# Patient Record
Sex: Female | Born: 1980 | Race: Black or African American | Hispanic: No | Marital: Single | Smoking: Current every day smoker
Health system: Southern US, Community
[De-identification: ages and names within clinical notes are randomized; demographics above are authoritative.]

## PROBLEM LIST (undated history)

## (undated) DIAGNOSIS — F32A Depression, unspecified: Secondary | ICD-10-CM

## (undated) DIAGNOSIS — D649 Anemia, unspecified: Secondary | ICD-10-CM

## (undated) DIAGNOSIS — B20 Human immunodeficiency virus [HIV] disease: Secondary | ICD-10-CM

## (undated) DIAGNOSIS — F329 Major depressive disorder, single episode, unspecified: Secondary | ICD-10-CM

## (undated) DIAGNOSIS — E119 Type 2 diabetes mellitus without complications: Secondary | ICD-10-CM

## (undated) DIAGNOSIS — I10 Essential (primary) hypertension: Secondary | ICD-10-CM

## (undated) DIAGNOSIS — K219 Gastro-esophageal reflux disease without esophagitis: Secondary | ICD-10-CM

## (undated) DIAGNOSIS — E109 Type 1 diabetes mellitus without complications: Secondary | ICD-10-CM

## (undated) DIAGNOSIS — F419 Anxiety disorder, unspecified: Secondary | ICD-10-CM

## (undated) HISTORY — PX: FRACTURE SURGERY: SHX138

## (undated) HISTORY — DX: Type 2 diabetes mellitus without complications: E11.9

## (undated) HISTORY — DX: Major depressive disorder, single episode, unspecified: F32.9

## (undated) HISTORY — PX: TONSILLECTOMY: SUR1361

## (undated) HISTORY — PX: CHOLECYSTECTOMY: SHX55

## (undated) HISTORY — DX: Type 1 diabetes mellitus without complications: E10.9

## (undated) HISTORY — DX: Depression, unspecified: F32.A

---

## 2008-06-21 ENCOUNTER — Ambulatory Visit: Payer: Self-pay | Admitting: Gynecology

## 2008-06-22 ENCOUNTER — Inpatient Hospital Stay (HOSPITAL_COMMUNITY): Admission: AD | Admit: 2008-06-22 | Discharge: 2008-06-26 | Payer: Self-pay | Admitting: Obstetrics & Gynecology

## 2008-06-22 ENCOUNTER — Ambulatory Visit: Payer: Self-pay | Admitting: Family Medicine

## 2008-07-03 ENCOUNTER — Ambulatory Visit: Payer: Self-pay | Admitting: Gynecology

## 2008-07-05 ENCOUNTER — Ambulatory Visit: Payer: Self-pay | Admitting: Obstetrics & Gynecology

## 2008-07-12 ENCOUNTER — Ambulatory Visit: Payer: Self-pay | Admitting: Obstetrics & Gynecology

## 2008-07-16 ENCOUNTER — Ambulatory Visit: Payer: Self-pay | Admitting: Obstetrics & Gynecology

## 2008-07-19 ENCOUNTER — Ambulatory Visit (HOSPITAL_COMMUNITY): Admission: RE | Admit: 2008-07-19 | Discharge: 2008-07-19 | Payer: Self-pay | Admitting: Obstetrics & Gynecology

## 2008-07-19 ENCOUNTER — Ambulatory Visit: Payer: Self-pay | Admitting: Obstetrics & Gynecology

## 2008-07-26 ENCOUNTER — Ambulatory Visit: Payer: Self-pay | Admitting: Obstetrics & Gynecology

## 2008-07-27 ENCOUNTER — Ambulatory Visit (HOSPITAL_COMMUNITY): Admission: RE | Admit: 2008-07-27 | Discharge: 2008-07-27 | Payer: Self-pay | Admitting: Obstetrics & Gynecology

## 2008-07-30 ENCOUNTER — Ambulatory Visit: Payer: Self-pay | Admitting: Family Medicine

## 2008-07-30 ENCOUNTER — Encounter: Payer: Self-pay | Admitting: Obstetrics & Gynecology

## 2008-07-30 LAB — CONVERTED CEMR LAB
AST: 7 units/L (ref 0–37)
BUN: 7 mg/dL (ref 6–23)
CO2: 17 meq/L — ABNORMAL LOW (ref 19–32)
Chloride: 106 meq/L (ref 96–112)
Creatinine, Ser: 0.46 mg/dL (ref 0.40–1.20)
Glucose, Bld: 236 mg/dL — ABNORMAL HIGH (ref 70–99)
HCT: 32.7 % — ABNORMAL LOW (ref 36.0–46.0)
MCHC: 33.9 g/dL (ref 30.0–36.0)
MCV: 88.1 fL (ref 78.0–100.0)
RDW: 13.1 % (ref 11.5–15.5)
Total Bilirubin: 0.2 mg/dL — ABNORMAL LOW (ref 0.3–1.2)
WBC: 7.3 10*3/uL (ref 4.0–10.5)

## 2008-07-31 ENCOUNTER — Ambulatory Visit: Payer: Self-pay | Admitting: Obstetrics & Gynecology

## 2008-07-31 ENCOUNTER — Inpatient Hospital Stay (HOSPITAL_COMMUNITY): Admission: AD | Admit: 2008-07-31 | Discharge: 2008-07-31 | Payer: Self-pay | Admitting: Obstetrics & Gynecology

## 2008-08-02 ENCOUNTER — Ambulatory Visit (HOSPITAL_COMMUNITY): Admission: RE | Admit: 2008-08-02 | Discharge: 2008-08-02 | Payer: Self-pay | Admitting: Obstetrics & Gynecology

## 2008-08-02 ENCOUNTER — Ambulatory Visit: Payer: Self-pay | Admitting: Obstetrics & Gynecology

## 2008-08-06 ENCOUNTER — Ambulatory Visit: Payer: Self-pay | Admitting: Obstetrics & Gynecology

## 2008-08-06 ENCOUNTER — Inpatient Hospital Stay (HOSPITAL_COMMUNITY): Admission: AD | Admit: 2008-08-06 | Discharge: 2008-08-08 | Payer: Self-pay | Admitting: Family Medicine

## 2008-08-13 ENCOUNTER — Ambulatory Visit: Payer: Self-pay | Admitting: Obstetrics & Gynecology

## 2008-08-20 ENCOUNTER — Ambulatory Visit: Payer: Self-pay | Admitting: Family Medicine

## 2008-08-24 ENCOUNTER — Inpatient Hospital Stay (HOSPITAL_COMMUNITY): Admission: RE | Admit: 2008-08-24 | Discharge: 2008-08-28 | Payer: Self-pay | Admitting: Family Medicine

## 2008-08-24 ENCOUNTER — Ambulatory Visit: Payer: Self-pay | Admitting: Obstetrics & Gynecology

## 2008-08-24 ENCOUNTER — Encounter: Payer: Self-pay | Admitting: Obstetrics & Gynecology

## 2008-08-30 ENCOUNTER — Inpatient Hospital Stay (HOSPITAL_COMMUNITY): Admission: AD | Admit: 2008-08-30 | Discharge: 2008-08-30 | Payer: Self-pay | Admitting: Obstetrics & Gynecology

## 2008-09-03 ENCOUNTER — Ambulatory Visit: Payer: Self-pay | Admitting: Obstetrics & Gynecology

## 2009-10-19 DIAGNOSIS — Z21 Asymptomatic human immunodeficiency virus [HIV] infection status: Secondary | ICD-10-CM

## 2009-10-19 DIAGNOSIS — B20 Human immunodeficiency virus [HIV] disease: Secondary | ICD-10-CM

## 2009-10-19 HISTORY — DX: Human immunodeficiency virus (HIV) disease: B20

## 2009-10-19 HISTORY — PX: TUBAL LIGATION: SHX77

## 2009-10-19 HISTORY — DX: Asymptomatic human immunodeficiency virus (hiv) infection status: Z21

## 2010-11-09 ENCOUNTER — Encounter: Payer: Self-pay | Admitting: *Deleted

## 2010-11-09 ENCOUNTER — Encounter: Payer: Self-pay | Admitting: Obstetrics & Gynecology

## 2010-11-10 ENCOUNTER — Encounter: Payer: Self-pay | Admitting: *Deleted

## 2011-03-03 NOTE — Op Note (Signed)
NAMEAYANI, OSPINA         ACCOUNT NO.:  [id number]   MEDICAL RECORD NO.:  [id number]         PATIENT TYPE:  WINP   LOCATION:  WOC                           FACILITY:  [facility]   PHYSICIAN:  Allie Bossier, MD        DATE OF BIRTH:  1981-06-16   DATE OF PROCEDURE:  DATE OF DISCHARGE:                               OPERATIVE REPORT   PREOPERATIVE DIAGNOSES:  1. Morbid obesity.  2. Preeclampsia.  3. Insulin-dependent diabetes mellitus, poorly controlled.  4. 37 weeks' estimated gestational age.  5. Previous cesarean section x2.   POSTOPERATIVE DIAGNOSES:  1. Morbid obesity.  2. Preeclampsia.  3. Insulin-dependent diabetes mellitus, poorly controlled.  4. 37 weeks' estimated gestational age.  5. Previous cesarean section x2.  6. Extensive omental adhesions.   SURGEON:  Allie Bossier, MD   ASSISTANT:  Shelbie Proctor. Shawnie Pons, MD   COMPLICATIONS:  None.   ANESTHESIA:  Spinal, Raul Del, MD   ESTIMATED BLOOD LOSS:  1 L.   SPECIMENS:  Placenta and cord blood.   FINDINGS:  Living female infant, Apgars 9 and 9 at 1 and 5 minutes, weight  pending, he had a loose nuchal cord, and a true knot in the umbilical  cord.   DETAILED PROCEDURE AND FINDINGS:  The risks, benefits, and alternatives  of the surgery were explained, understood, and accepted.  Consents were  signed.  Dr. Shawnie Pons had an extensive conversation with Rainelle about  tying her tubes and the risks of surgery with each successive cesarean  section.  We also counseled her that we would be making a vertical  incision due to her pannus that comes down over her thighs.  Her spinal  anesthesia was followed without complication.  She was prepped and  draped in the usual sterile fashion and she had a gentle tilt to the  left.  After adequate anesthesia was assured, a vertical incision was  made up to the umbilicus.  Incision was carried down through the 8 cm of  subcutaneous tissue.  Bleeding encountered was cauterized with  Bovie.  The fascia was scored in the midline.  The fascia was elevated with  Kocher clamps and incised inferiorly and superiorly.  There was a large  amount of omental adhesions to the fascia was very careful when  dissecting to avoid injury to any surrounding structures.  Once we were  into the peritoneum, it was obvious that we did not have enough room to  deliver the baby through the incision that was made and therefore curved  the skin incision to the left of the umbilicus.  The subcutaneous tissue  at this site was taken down with the Bovie and better exposure was  obtained.  A bladder blade was placed and transverse incision was made  on the moderately well-developed lower uterine segment.  Amniotomy was  performed and clear fluid was noted.  The baby was delivered from vertex  presentation.  Mouth and nostrils were suctioned prior to deliver of the  shoulders.  The loose nuchal cord was easily reduced.  The cord was  clamped and cut and the baby  was transferred to the NICU personnel for  routine care.  The weight is pending.  Cord blood sample was obtained.  The placenta was extracted manually and the uterine tear was cleaned  with a dry lap sponge.  The uterus was left in situ.  Retractors were  placed and the uterine incision was closed with 2 layers of 0 chromic  running locking suture.  Excellent hemostasis was noted.  The adnexa  were palpably normal.  The uterine incision was again inspected and  noted to be hemostatic as was the rectus fascia.  The rectus fascia was  elevated with Kocher clamps and closed with a 0 PDS in a running  nonlocking fashion.  No defects were palpable.  Subcutaneous tissue was  irrigated, cleaned and dried.  The deep amount of subcutaneous tissue  was closed with a 2-0 plain catgut suture in a running nonlocking  fashion.  This was done to close the dead space to help prevent an  infection.  A subcuticular closure was done with 3-0 Vicryl suture.   Steri-Strips were placed across the incision.  Please note that at the  beginning of the case prior to the incision, 30 mL of 0.5% Marcaine was  injected in the subcutaneous tissue at the site of the expected  incision.   She was taken to the recovery room in stable condition.  She will be  transferred to the ICU for magnesium therapy due to her diagnosis of  preeclampsia.  This will be given for 24 hours.  She tolerated the  procedure well and a Foley catheter drained clear urine throughout.  The  instrument, sponge, and needle counts were correct.      Allie Bossier, MD  Electronically Signed     MCD/MEDQ  D:  08/24/2008  T:  08/25/2008  Job:  [id number]

## 2011-03-03 NOTE — Discharge Summary (Signed)
NAMENELI, FOFANA         ACCOUNT NO.:  [id number]   MEDICAL RECORD NO.:  [id number]           PATIENT TYPE:   LOCATION:                                 FACILITY:   PHYSICIAN:  Allie Bossier, MD        DATE OF BIRTH:  1981-04-07   DATE OF ADMISSION:  08/06/2008  DATE OF DISCHARGE:  08/08/2008                               DISCHARGE SUMMARY   REASON FOR HOSPITALIZATION:  [name] is a 30 year old  gravida 4, para 1-1-1-2 who is admitted at 66 for weeks' gestational age  due to elevated blood pressures noted in the [facility].  The  patient has been followed this pregnancy for type 2 diabetes, as well as  late presentation for care, and morbid obesity.  In Evaluation Clinic on  the day of admission, she was noted to have blood pressure 149/89, as  well as elevated fasting blood glucose, so she was admitted for further  evaluation.   HOSPITAL COURSE:  The patient was admitted mostly for glucose control as  well as testing for gestational hypertension.  As per previous  admission, the patient was placed on a hospital diet.  Her blood sugars  remained under good control with her usual amount of insulin.  Her PIH  workup revealed a CBC that showed hemoglobin and hematocrit of 10.7 and  31.7 respectively and platelets of 185.  Her CMP was unremarkable with  normal liver function test.  A 24-hour urine for protein is pending.  The 24-hour urine creatinine level was 1650, which is within the normal  range.  An ultrasound was performed, which showed a single 10 in the  cephalic presentation with a posterior placenta and normal AFI at 16 and  EFW of 35%, and a BPP of 8/8.  The estimated fetal weight is  appropriate, although there is a downward trend noted.  During her  admission, her blood pressures were persistently in the 130s over 80s  range.  Additionally, her fasting blood glucoses were in the 60-80  range.  At the time of discharge, the patient is without  headaches,  abdominal pain, or visual disturbances, she has no complaints  whatsoever.  Her physical exam at discharge is essentially unremarkable  except for trace pretibial edema, and normal reflexes and no clonus.   FINAL DIAGNOSES:  1. Type 2 diabetes mellitus.  2. Gestational hypertension.  3. Morbid obesity.  4. Intrauterine pregnancy at 34-6/7th weeks gestational age.  5. History of previous cesarean section.  6. Late presentation for prenatal care.   CONDITION AT DISCHARGE:  Good.   DISCHARGE INSTRUCTIONS:  The patient will continue her previous dose of  insulin, which includes 14 units NPH and 9 units regular every morning,  5 units regular at dinner time, and 10 units NPH at bedtime.  She also  continue her prenatal vitamins.  She has an appointment for the [facility] tomorrow, Thursday, October 24.  All the patient's questions  were answered and the patient voiced understanding of our instructions.      Odie Sera, DO  Electronically Signed  ______________________________  Allie Bossier, MD    MC/MEDQ  D:  08/08/2008  T:  08/09/2008  Job:  [id number]

## 2011-03-03 NOTE — Discharge Summary (Signed)
Sherry Hall, Sherry Hall         ACCOUNT NO.:  [id number]   MEDICAL RECORD NO.:  [id number]           PATIENT TYPE:   LOCATION:                                 FACILITY:   PHYSICIAN:  Allie Bossier, MD        DATE OF BIRTH:  July 20, 1981   DATE OF ADMISSION:  08/24/2008  DATE OF DISCHARGE:  08/28/2008                               DISCHARGE SUMMARY   DISCHARGE DIAGNOSES:  1. Status post repeat low transverse cesarean section.  2. Intrauterine pregnancy at 37 weeks' gestation.  3. Morbid obesity.  4. Preeclampsia.  5. Insulin-dependent diabetes mellitus.   PROCEDURE:  Repeat low transverse cesarean section.   CONSULTS:  Child psychotherapist for history of maternal substance abuse.  Positive UDS in infant.   HOSPITAL COURSE:  A 30 year old G4, P1-1-1-2, at approximately 72 weeks'  gestation presented for repeat cesarean section for preeclampsia and 2  prior cesarean sections.  The patient underwent cesarean section without  complications, delivered a 5.7 pound female infant with Apgars 9 at 1  minute and 10 at 5 minutes.  Loose nuchal cord x1 with true knot in  umbilical cord.  Placenta was extracted manually.  The patient tolerated  the procedure well.  Estimated blood loss was 1000 mL.  1. Status post cesarean section, the patient was transferred to the      AICU for 24 hours of postpartum magnesium for preeclampsia.  2. Status post magnesium, the patient was transferred to General      Mother and Baby Unit without complications.  3. Hypertension, the patient's blood pressure remained elevated      postpartum.  Elevated blood pressures were initially controlled      with labetalol 200 mg b.i.d., hydrochlorothiazide 25 mg daily was      added on postop day #3, enalapril 5 mg p.o. daily was added at      discharge for renal protection as the patient has a history of      diabetes mellitus.  It is of note due to recent use of marijuana,      the patient was interviewed by Child psychotherapist and  Department of      Social Services for positive marijuana in neonate urine.  The      patient was very distraught throughout process and blood pressures      remained elevated.  The patient was asymptomatic from elevated      blood pressures besides occasional headache.  Prior to discharge,      blood pressure was stable systolic in the 140s, diastolic 80s-90s.  4. Diabetes mellitus.  Blood sugars were controlled with NPH and      regular insulin.  Regimen was adjusted to achieve fasting blood      sugars of less than 100.  On postop day #3, the patient had 2      episodes of hypoglycemia which resulted in jitteriness and shaking      which quickly resolved with juice and crackers.  Insulin regimen      prior to discharge was NPH 6 units q.a.m., regular insulin 5 units  q.a.m., and regular insulin 5 units q.p.m.   ADMISSION LABORATORY DATA:  GBS negative, HIV negative, RPR negative,  hepatitis B surface antigen negative, rubella immune.   DISCHARGE LABORATORY DATA:  On August 26, 2008, hemoglobin 9.4,  hematocrit 27.4.   DISCHARGE MEDICATIONS:  1. Darvocet-N 100/650 mg one p.o. q.3 h. p.r.n. pain.  2. Motrin 600 mg one p.o. q.6 h. p.r.n. pain.  3. NPH 6 units q.a.m.  4. Regular insulin 5 units q.a.m.  5. Regular insulin 5 units q.p.m.  6. Hydrochlorothiazide 25 mg p.o. daily  7. Labetalol 200 mg p.o. b.i.d.  8. Enalapril 5 mg p.o. daily.  9. Iron sulfate 325 mg one p.o. b.i.d.  10.Colace 100 mg one p.o. b.i.d. p.r.n. constipation.  11.Status post Depo-Provera 150 mg IM shot x1.   DISCHARGE INSTRUCTIONS:  1. Diabetic, low-carb diet.  2. Pelvic rest x6 weeks, no heavy lifting.  3. Follow up in 1 week at [facility] on Monday, September 03, 2008, at      11 a.m. for incision check and blood pressure check.  4. Follow up in 4 weeks at [facility] for      postpartum check.  5. Schedule a followup appoint with [facility], primary      care  physician, for diabetes mellitus control.      Sherry Antis, MD  Electronically Signed     ______________________________  Allie Bossier, MD    KD/MEDQ  D:  08/28/2008  T:  08/29/2008  Job:  [id number]

## 2011-03-03 NOTE — Discharge Summary (Signed)
Sherry Hall         ACCOUNT NO.:  [id number]   MEDICAL RECORD NO.:  [id number]          PATIENT TYPE:  INP   LOCATION:  9157                          FACILITY:  [facility]   PHYSICIAN:  Lesly Dukes, M.D. DATE OF BIRTH:  1981-01-28   DATE OF ADMISSION:  06/22/2008  DATE OF DISCHARGE:  06/26/2008                               DISCHARGE SUMMARY   REASON FOR ADMISSION:  [name] is a 30 year old Gravida 4, para 1-  1-1-2, who presents at [redacted] weeks gestational age for an admission due to  uncontrolled hyperglycemia. [name] presented late in her prenatal  course at 75 weeks at [facility]. Upon  evaluation there, it was found that her Glucola was markedly elevated  over 200 and she was sent to [facility]. Upon presentation  at [facility], she had a fasting glucose of well over 200  and the decision was made to admit her to develop an insulin regimen and  give her the appropriate diabetic and nutritional teaching. During her  admission she had appropriate counseling for nutrition as well as  diabetic teaching to include discussion on the use of insulin, as well  as checking her blood sugars on a routine basis. The patient was very  open and admitted a very poor social situation to include not having a  place to live and frequently having to live in shelters. Therefore, her  availability of healthy food options is quite limited. Interestingly, on  appropriate diabetic diet in the hospital, she had very good glucose  control with needs for small amounts of insulin, relative to her body  weight. Her insulin dosages at discharge are NPH 14 units and regular 9  units every morning; regular 5 units before dinner and 10 units of NPH  at bedtime. Addiionally, after the results of a UDS were positive for  marijuana use, the patient admitted to regular marijuana use.  She  stated that she was trying to stop.   FINAL  DIAGNOSES:  1. Class C diabetes mellitus.  2. Intrauterine pregnancy  at 28 weeks.  3. Proteinuria.   LABORATORY DATA:  Important labs consisted of a hemoglobin and  hematocrit of 10.6 and 31.7, platelets 176,000. Hemoglobin A1C 6.5. She  had a positive urine drug screen for marijuana with a THC level of 480  nanograms per ml. Her blood type is noted to be A+. She had a 24 hour  urine protein, which was 672 mg per day per 24 hour period. On a dip  urinalysis, she had no proteinuria noted. It is unclear whether this  proteinuria is related to chronic hypertension and diabetic nephropathy  or this is mild pre-eclampsia. The patient will be monitored closely  with twice weekly testing as well as close followup on labs and repeat  24 hour urine protein as indicated.   CONDITION ON DISCHARGE:  Good.   DISCHARGE INSTRUCTIONS:  The patient was instructed and was advised on  her twice weekly testing and she has an appointment with the High Risk  Clinic on Thursday at 2:30 p.m. for NST and then  on Monday, July 02, 2008 at 9:00 a.m. for a high risk visit. Additionally, she will see  maternal fetal medicine this Thursday at 10:00 a.m. The patient is aware  of these followup appointments.   DISCHARGE PRESCRIPTIONS:  1. She was given prescriptions for Glucometer, Lancets insulin      syringes, as well as insulin.  2. She was also given #20 tablets of Benadryl, 25 mg to be used as      needed for sleep.      Odie Sera, DO  Electronically Signed     ______________________________  Lesly Dukes, M.D.    MC/MEDQ  D:  06/26/2008  T:  06/27/2008  Job:  [id number]

## 2011-07-20 LAB — POCT URINALYSIS DIP (DEVICE)
Bilirubin Urine: NEGATIVE
Bilirubin Urine: NEGATIVE
Glucose, UA: NEGATIVE
Hgb urine dipstick: NEGATIVE
Nitrite: NEGATIVE
Nitrite: NEGATIVE
Operator id: 297281
Protein, ur: 100 — AB
Specific Gravity, Urine: 1.015
Specific Gravity, Urine: 1.025
Urobilinogen, UA: 0.2
Urobilinogen, UA: 0.2
pH: 7

## 2011-07-21 LAB — GLUCOSE, CAPILLARY
Glucose-Capillary: 107 — ABNORMAL HIGH
Glucose-Capillary: 111 — ABNORMAL HIGH
Glucose-Capillary: 65 — ABNORMAL LOW
Glucose-Capillary: 97
Glucose-Capillary: 107 — ABNORMAL HIGH
Glucose-Capillary: 110 — ABNORMAL HIGH
Glucose-Capillary: 126 — ABNORMAL HIGH
Glucose-Capillary: 127 — ABNORMAL HIGH
Glucose-Capillary: 59 — ABNORMAL LOW
Glucose-Capillary: 70
Glucose-Capillary: 70
Glucose-Capillary: 72
Glucose-Capillary: 73
Glucose-Capillary: 79
Glucose-Capillary: 88
Glucose-Capillary: 97
Glucose-Capillary: 99

## 2011-07-21 LAB — POCT URINALYSIS DIP (DEVICE)
Bilirubin Urine: NEGATIVE
Bilirubin Urine: NEGATIVE
Bilirubin Urine: NEGATIVE
Glucose, UA: 500 — AB
Glucose, UA: NEGATIVE
Glucose, UA: NEGATIVE
Glucose, UA: NEGATIVE
Hgb urine dipstick: NEGATIVE
Ketones, ur: 40 — AB
Ketones, ur: NEGATIVE
Ketones, ur: NEGATIVE
Ketones, ur: NEGATIVE
Nitrite: NEGATIVE
Nitrite: NEGATIVE
Nitrite: NEGATIVE
Nitrite: NEGATIVE
Operator id: 194561
Operator id: 200901
Protein, ur: 100 — AB
Protein, ur: 30 — AB
Specific Gravity, Urine: 1.01
Specific Gravity, Urine: 1.01
Specific Gravity, Urine: <=1.005
Urobilinogen, UA: 0.2
Urobilinogen, UA: 0.2
Urobilinogen, UA: 0.2
pH: 6
pH: 6.5
pH: 6.5
pH: 7
pH: 7

## 2011-07-21 LAB — CBC
HCT: 31.7 — ABNORMAL LOW
HCT: 30 — ABNORMAL LOW
Hemoglobin: 10.2 — ABNORMAL LOW
Hemoglobin: 9.4 — ABNORMAL LOW
MCV: 91.2
Platelets: 190
RBC: 3.51 — ABNORMAL LOW
RBC: 3 — ABNORMAL LOW
RBC: 3.29 — ABNORMAL LOW
RDW: 13.5
WBC: 8.2
WBC: 6.6
WBC: 8.6

## 2011-07-21 LAB — CREATININE, URINE, 24 HOUR
Collection Interval-UCRE24: 24
Creatinine, Urine: 110
Urine Total Volume-UCRE24: 1500

## 2011-07-21 LAB — COMPREHENSIVE METABOLIC PANEL
ALT: 12
AST: 7
Albumin: 2.2 — ABNORMAL LOW
Albumin: 2.3 — ABNORMAL LOW
Alkaline Phosphatase: 82
Alkaline Phosphatase: 101
BUN: 6
BUN: 5 — ABNORMAL LOW
CO2: 24
Calcium: 8.4
Chloride: 106
GFR calc Af Amer: >60
GFR calc Af Amer: >60
GFR calc non Af Amer: >60
Glucose, Bld: 110 — ABNORMAL HIGH
Potassium: 3.4 — ABNORMAL LOW
Potassium: 3.2 — ABNORMAL LOW
Sodium: 137
Total Protein: 5.7 — ABNORMAL LOW
Total Protein: 6.2

## 2011-07-21 LAB — CROSSMATCH: ABO/RH(D): A POS

## 2011-07-21 LAB — PROTEIN, URINE, 24 HOUR
Protein, Urine: 65
Urine Total Volume-UPROT: 1500

## 2011-07-21 LAB — RPR: RPR Ser Ql: NONREACTIVE

## 2011-07-22 LAB — URINALYSIS, ROUTINE W REFLEX MICROSCOPIC
Glucose, UA: NEGATIVE
Hgb urine dipstick: NEGATIVE
Protein, ur: NEGATIVE
Specific Gravity, Urine: <1.005 — ABNORMAL LOW
pH: 6.5

## 2011-07-22 LAB — CREATININE CLEARANCE, URINE, 24 HOUR
Collection Interval-CRCL: 24
Creatinine Clearance: 261 — ABNORMAL HIGH
Creatinine, 24H Ur: 1505
Creatinine: 0.4

## 2011-07-22 LAB — GLUCOSE, CAPILLARY
Glucose-Capillary: 100 — ABNORMAL HIGH
Glucose-Capillary: 103 — ABNORMAL HIGH
Glucose-Capillary: 113 — ABNORMAL HIGH
Glucose-Capillary: 131 — ABNORMAL HIGH
Glucose-Capillary: 137 — ABNORMAL HIGH
Glucose-Capillary: 67 — ABNORMAL LOW
Glucose-Capillary: 76
Glucose-Capillary: 82
Glucose-Capillary: 85
Glucose-Capillary: 90
Glucose-Capillary: 98

## 2011-07-22 LAB — CBC
HCT: 32.1 — ABNORMAL LOW
Hemoglobin: 10.8 — ABNORMAL LOW
MCHC: 33.7
MCV: 92.1
RBC: 3.49 — ABNORMAL LOW

## 2011-07-22 LAB — POCT URINALYSIS DIP (DEVICE)
Glucose, UA: NEGATIVE
Hgb urine dipstick: NEGATIVE
Nitrite: NEGATIVE
Urobilinogen, UA: 1

## 2011-07-22 LAB — COMPREHENSIVE METABOLIC PANEL
BUN: 6
CO2: 24
Calcium: 8.5
Creatinine, Ser: 0.4
GFR calc non Af Amer: >60
Glucose, Bld: 150 — ABNORMAL HIGH

## 2011-07-22 LAB — HEMOGLOBIN A1C: Hgb A1c MFr Bld: 6.4 — ABNORMAL HIGH

## 2011-07-22 LAB — PROTEIN, URINE, 24 HOUR
Collection Interval-UPROT: 24
Protein, Urine: 34

## 2011-07-22 LAB — RAPID HIV SCREEN (WH-MAU): Rapid HIV Screen: NONREACTIVE

## 2011-07-28 DIAGNOSIS — F419 Anxiety disorder, unspecified: Secondary | ICD-10-CM | POA: Insufficient documentation

## 2011-10-01 DIAGNOSIS — F4310 Post-traumatic stress disorder, unspecified: Secondary | ICD-10-CM | POA: Insufficient documentation

## 2012-03-21 DIAGNOSIS — E669 Obesity, unspecified: Secondary | ICD-10-CM | POA: Insufficient documentation

## 2012-03-21 DIAGNOSIS — E785 Hyperlipidemia, unspecified: Secondary | ICD-10-CM | POA: Insufficient documentation

## 2012-04-04 DIAGNOSIS — R809 Proteinuria, unspecified: Secondary | ICD-10-CM | POA: Insufficient documentation

## 2012-04-04 DIAGNOSIS — G4700 Insomnia, unspecified: Secondary | ICD-10-CM | POA: Insufficient documentation

## 2013-01-15 ENCOUNTER — Encounter (HOSPITAL_COMMUNITY): Payer: Self-pay

## 2013-01-15 ENCOUNTER — Emergency Department (HOSPITAL_COMMUNITY): Payer: Self-pay

## 2013-01-15 ENCOUNTER — Emergency Department (HOSPITAL_COMMUNITY)
Admission: EM | Admit: 2013-01-15 | Discharge: 2013-01-15 | Disposition: A | Discharge status: Home / Self Care | Payer: Self-pay | Attending: Emergency Medicine | Admitting: Emergency Medicine

## 2013-01-15 DIAGNOSIS — F17200 Nicotine dependence, unspecified, uncomplicated: Secondary | ICD-10-CM | POA: Insufficient documentation

## 2013-01-15 DIAGNOSIS — Z79899 Other long term (current) drug therapy: Secondary | ICD-10-CM | POA: Insufficient documentation

## 2013-01-15 DIAGNOSIS — R05 Cough: Secondary | ICD-10-CM | POA: Insufficient documentation

## 2013-01-15 DIAGNOSIS — E119 Type 2 diabetes mellitus without complications: Secondary | ICD-10-CM | POA: Insufficient documentation

## 2013-01-15 DIAGNOSIS — J209 Acute bronchitis, unspecified: Secondary | ICD-10-CM | POA: Insufficient documentation

## 2013-01-15 DIAGNOSIS — J90 Pleural effusion, not elsewhere classified: Secondary | ICD-10-CM | POA: Insufficient documentation

## 2013-01-15 DIAGNOSIS — R42 Dizziness and giddiness: Secondary | ICD-10-CM | POA: Insufficient documentation

## 2013-01-15 DIAGNOSIS — R112 Nausea with vomiting, unspecified: Secondary | ICD-10-CM | POA: Insufficient documentation

## 2013-01-15 DIAGNOSIS — I10 Essential (primary) hypertension: Secondary | ICD-10-CM | POA: Insufficient documentation

## 2013-01-15 DIAGNOSIS — R059 Cough, unspecified: Secondary | ICD-10-CM | POA: Insufficient documentation

## 2013-01-15 DIAGNOSIS — R5381 Other malaise: Secondary | ICD-10-CM | POA: Insufficient documentation

## 2013-01-15 DIAGNOSIS — Z21 Asymptomatic human immunodeficiency virus [HIV] infection status: Secondary | ICD-10-CM | POA: Insufficient documentation

## 2013-01-15 HISTORY — DX: Essential (primary) hypertension: I10

## 2013-01-15 HISTORY — DX: Human immunodeficiency virus (HIV) disease: B20

## 2013-01-15 LAB — CBC
MCH: 28.9 pg (ref 26.0–34.0)
Platelets: 211 10*3/uL (ref 150–400)
RBC: 3.84 MIL/uL — ABNORMAL LOW (ref 3.87–5.11)

## 2013-01-15 LAB — BASIC METABOLIC PANEL
CO2: 29 mEq/L (ref 19–32)
Calcium: 9 mg/dL (ref 8.4–10.5)
GFR calc non Af Amer: 82 mL/min — ABNORMAL LOW (ref >90)
Potassium: 3.2 mEq/L — ABNORMAL LOW (ref 3.5–5.1)
Sodium: 132 mEq/L — ABNORMAL LOW (ref 135–145)

## 2013-01-15 LAB — PRO B NATRIURETIC PEPTIDE: Pro B Natriuretic peptide (BNP): 36.6 pg/mL (ref 0–125)

## 2013-01-15 LAB — POCT I-STAT TROPONIN I: Troponin i, poc: 0 ng/mL (ref 0.00–0.08)

## 2013-01-15 MED ORDER — AZITHROMYCIN 250 MG PO TABS: 250.0000 mg | ORAL_TABLET | Freq: Every day | ORAL | Status: DC

## 2013-01-15 MED ORDER — HYDROCODONE-ACETAMINOPHEN 5-325 MG PO TABS: 2.0000 | ORAL_TABLET | ORAL | Status: DC | PRN

## 2013-01-15 NOTE — ED Notes (Signed)
Pt comfortable with d/c and f/u instructions. Give prescriptions x2.

## 2013-01-15 NOTE — ED Notes (Signed)
EDPA at bedside for assessment 

## 2013-01-15 NOTE — ED Notes (Signed)
Patient presents with left sided chest pain (under left breast) that radiates to left neck. Intermittently occuring over the past 3 weeks but at its worst today. Also endorses shortness of breath, dizziness, nausea, vomiting, fatigue, anorexia, cough, chills and weakness. Denies fevers or sweats.

## 2013-01-15 NOTE — ED Provider Notes (Signed)
History     CSN: [id number]  Arrival date & time 01/15/13  2138   First MD Initiated Contact with Patient 01/15/13 2218      Chief Complaint  Patient presents with  . Chest Pain    (Consider location/radiation/quality/duration/timing/severity/associated sxs/prior treatment) HPI Comments: Patient is a 31y.o female with a hx of DM and HIV who presents for chest pain under her L breast, gradually worsening x 4 days. Patient states the pain is sharp in nature and worse when supine and with inspiration and movement, radiating to her L shoulder. Relieved with rest and shallower breathing, the shallow breathing causing her to feel short of breath. Patient admits to associated fatigue, dizziness, nausea, nonbloody/nonbilious emesis x 2, and cough. Patient's PCP is her ID doctor at [facility].  The history is provided by the patient. No language interpreter was used.    Past Medical History  Diagnosis Date  . Diabetes mellitus without complication   . Hypertension   . HIV (human immunodeficiency virus infection) 2011    Past Surgical History  Procedure Laterality Date  . Cholecystectomy    . Cesarean section  2001, 2008, 2009, 2011  . Tonsillectomy    . Tubal ligation  2011    No family history on file.  History  Substance Use Topics  . Smoking status: Current Every Day Smoker -- 1.00 packs/day    Types: Cigarettes  . Smokeless tobacco: Never Used  . Alcohol Use: No    OB History   Grav Para Term Preterm Abortions TAB SAB Ect Mult Living                  Review of Systems  Constitutional: Positive for chills and diaphoresis.  HENT: Negative for trouble swallowing, neck pain and neck stiffness.   Eyes: Negative for visual disturbance.  Respiratory: Positive for chest tightness and shortness of breath. Negative for wheezing.   Cardiovascular: Positive for chest pain.  Gastrointestinal: Positive for nausea. Negative for abdominal pain and blood in stool.  Genitourinary:  Negative for dysuria, hematuria, vaginal bleeding and vaginal discharge.  Musculoskeletal: Negative for back pain.  Skin: Negative for color change.  Neurological: Positive for dizziness and light-headedness. Negative for syncope, weakness and numbness.  All other systems reviewed and are negative.    Allergies  Latex and Percocet  Home Medications   Current Outpatient Rx  Name  Route  Sig  Dispense  Refill  . Emtricitab-Rilpivir-Tenofovir (COMPLERA) 200-25-300 MG TABS   Oral   Take 1 tablet by mouth every evening.         Marland Kitchen lisinopril-hydrochlorothiazide (PRINZIDE,ZESTORETIC) 20-25 MG per tablet   Oral   Take 1 tablet by mouth daily.         . metFORMIN (GLUCOPHAGE) 500 MG tablet   Oral   Take 500 mg by mouth 2 (two) times daily with a meal.         . azithromycin (ZITHROMAX) 250 MG tablet   Oral   Take 1 tablet (250 mg total) by mouth daily. Take first 2 tablets together, then 1 every day until finished.   6 tablet   0   . HYDROcodone-acetaminophen (NORCO/VICODIN) 5-325 MG per tablet   Oral   Take 2 tablets by mouth every 4 (four) hours as needed for pain.   15 tablet   0     BP 112/69  Pulse 99  Temp(Src) 98.1 F (36.7 C) (Oral)  Resp 18  SpO2 100%  LMP 12/22/2012  Physical Exam  Nursing note and vitals reviewed. Constitutional: She is oriented to person, place, and time. No distress.  Morbidly obese  HENT:  Head: Normocephalic and atraumatic.  Mouth/Throat: Oropharynx is clear and moist. No oropharyngeal exudate.  Eyes: Conjunctivae are normal. Pupils are equal, round, and reactive to light. No scleral icterus.  Neck: Normal range of motion. Neck supple.  Cardiovascular: Normal rate, regular rhythm, normal heart sounds and intact distal pulses.   HR in high 90s on auscultation  Pulmonary/Chest: Breath sounds normal. No respiratory distress. She has no wheezes. She has no rales.  Decreased inspiratory effort secondary to pain. Mildly pleuritic  pain on inspiration on L side. Rhonchi appreciated on auscultation of left lower lobe.   Abdominal: Soft. Bowel sounds are normal. She exhibits no distension. There is no tenderness. There is no rebound and no guarding.  Musculoskeletal: Normal range of motion. She exhibits no edema.  Lymphadenopathy:    She has no cervical adenopathy.  Neurological: She is alert and oriented to person, place, and time.  Skin: Skin is warm and dry. No rash noted. She is not diaphoretic. No erythema.  Psychiatric: She has a normal mood and affect. Her behavior is normal.    ED Course  Procedures (including critical care time)  Labs Reviewed  CBC - Abnormal; Notable for the following:    RBC 3.84 (*)    Hemoglobin 11.1 (*)    HCT 32.0 (*)    All other components within normal limits  BASIC METABOLIC PANEL - Abnormal; Notable for the following:    Sodium 132 (*)    Potassium 3.2 (*)    Chloride 93 (*)    Glucose, Bld 306 (*)    GFR calc non Af Amer 82 (*)    All other components within normal limits  PRO B NATRIURETIC PEPTIDE  POCT I-STAT TROPONIN I   Dg Chest 2 View  01/15/2013  *RADIOLOGY REPORT*  Clinical Data: Chest pain  CHEST - 2 VIEW  Comparison: None.  Findings: Normal heart size. Minimal atelectasis at the left base. No pneumothorax.  Small left pleural effusion.  IMPRESSION: Small left pleural effusion and basilar atelectasis.   Original Report Authenticated By: Jolaine Click, M.D.     Date: 01/15/2013  Rate: 99  Rhythm: normal sinus rhythm  QRS Axis: normal  Intervals: normal  ST/T Wave abnormalities: normal  Conduction Disutrbances:none  Narrative Interpretation: NSR without STEMI or T wave changes  Old EKG Reviewed: none available   1. Acute bronchitis   2. Pleural effusion on left     MDM  Patient presents for sharp chest pain under her L breast radiating to her L shoulder. ED work up to include CXR, basic labs, BNP, and troponin. On physical exam, patient exhibited poor  inspiratory effort secondary to pain and rhonchi appreciated at posterior left lower lobe. Patient nontoxic appearing, in no acute or respiratory distress and hemodynamically stable. EKG shows NSR without STEMI.  CXR significant for small L pleural effusion. Labs significant for hyperglycemia without anion gap, the rest being c/w or improved from prior work ups. Troponin and BNP wnl. Patient uncomfortable with deep inspiration, but unable to control pain with narcotic pain meds as patient drove herself to ED; increased HR likely 2/2 pain level. Patient has remained nontoxic appearing, in no acute or respiratory distress and hemodynamically stable. Will d/c with ID follow up as well as a Z-pack and norco to take as needed for pain. Indications for ED return discussed.  Patient states comfort and understanding with this d/c plan with no unaddressed concerns. Patient work up and management discussed with Dr. Ignacia Palma who also examined the patient and is in agreement.      Antony Madura, PA-C 01/17/13 1945

## 2013-01-15 NOTE — ED Notes (Signed)
Dr. Ignacia Palma and Antony Madura PA at bedside

## 2013-01-18 NOTE — ED Provider Notes (Signed)
Medical screening examination/treatment/procedure(s) were conducted as a shared visit with non-physician practitioner(s) and myself.  I personally evaluated the patient during the encounter 32 yo woman with left pleuritic chest pain, negative PE, negative lab workup.  Rx for bronchitis with pleurisy with Z-Pak.  Carleene Cooper III, MD 01/18/13 2132

## 2014-03-06 ENCOUNTER — Ambulatory Visit: Payer: Self-pay

## 2014-03-27 ENCOUNTER — Ambulatory Visit: Payer: Self-pay

## 2014-05-01 DIAGNOSIS — N898 Other specified noninflammatory disorders of vagina: Secondary | ICD-10-CM | POA: Insufficient documentation

## 2014-05-02 ENCOUNTER — Ambulatory Visit: Payer: Self-pay | Admitting: *Deleted

## 2014-05-08 ENCOUNTER — Ambulatory Visit: Payer: Self-pay | Admitting: *Deleted

## 2014-06-14 ENCOUNTER — Ambulatory Visit: Payer: Self-pay

## 2014-07-31 ENCOUNTER — Ambulatory Visit: Payer: Self-pay

## 2014-08-07 ENCOUNTER — Ambulatory Visit: Payer: Self-pay

## 2014-09-18 ENCOUNTER — Ambulatory Visit: Payer: Self-pay

## 2014-12-28 DIAGNOSIS — I152 Hypertension secondary to endocrine disorders: Secondary | ICD-10-CM | POA: Insufficient documentation

## 2015-02-24 ENCOUNTER — Emergency Department (HOSPITAL_COMMUNITY)
Admission: EM | Admit: 2015-02-24 | Discharge: 2015-02-24 | Disposition: A | Discharge status: Home / Self Care | Payer: Medicaid Other | Attending: Emergency Medicine | Admitting: Emergency Medicine

## 2015-02-24 ENCOUNTER — Encounter (HOSPITAL_COMMUNITY): Payer: Self-pay | Admitting: *Deleted

## 2015-02-24 ENCOUNTER — Emergency Department (HOSPITAL_COMMUNITY): Payer: Medicaid Other

## 2015-02-24 DIAGNOSIS — W109XXA Fall (on) (from) unspecified stairs and steps, initial encounter: Secondary | ICD-10-CM | POA: Insufficient documentation

## 2015-02-24 DIAGNOSIS — S53401A Unspecified sprain of right elbow, initial encounter: Secondary | ICD-10-CM

## 2015-02-24 DIAGNOSIS — Z792 Long term (current) use of antibiotics: Secondary | ICD-10-CM | POA: Insufficient documentation

## 2015-02-24 DIAGNOSIS — E119 Type 2 diabetes mellitus without complications: Secondary | ICD-10-CM | POA: Insufficient documentation

## 2015-02-24 DIAGNOSIS — Y929 Unspecified place or not applicable: Secondary | ICD-10-CM | POA: Insufficient documentation

## 2015-02-24 DIAGNOSIS — Y999 Unspecified external cause status: Secondary | ICD-10-CM | POA: Insufficient documentation

## 2015-02-24 DIAGNOSIS — Z91040 Latex allergy status: Secondary | ICD-10-CM | POA: Insufficient documentation

## 2015-02-24 DIAGNOSIS — I10 Essential (primary) hypertension: Secondary | ICD-10-CM

## 2015-02-24 DIAGNOSIS — Z21 Asymptomatic human immunodeficiency virus [HIV] infection status: Secondary | ICD-10-CM | POA: Insufficient documentation

## 2015-02-24 DIAGNOSIS — Y939 Activity, unspecified: Secondary | ICD-10-CM | POA: Insufficient documentation

## 2015-02-24 DIAGNOSIS — Z720 Tobacco use: Secondary | ICD-10-CM | POA: Insufficient documentation

## 2015-02-24 DIAGNOSIS — M25521 Pain in right elbow: Secondary | ICD-10-CM

## 2015-02-24 DIAGNOSIS — Z79899 Other long term (current) drug therapy: Secondary | ICD-10-CM | POA: Insufficient documentation

## 2015-02-24 DIAGNOSIS — S59901A Unspecified injury of right elbow, initial encounter: Secondary | ICD-10-CM | POA: Diagnosis present

## 2015-02-24 MED ORDER — HYDROCODONE-ACETAMINOPHEN 5-325 MG PO TABS
1.0000 | ORAL_TABLET | Freq: Once | ORAL | Status: AC
  Administered 2015-02-24: 1 via ORAL
  Filled 2015-02-24: qty 1

## 2015-02-24 MED ORDER — HYDROCODONE-ACETAMINOPHEN 5-325 MG PO TABS: 1.0000 | ORAL_TABLET | Freq: Four times a day (QID) | ORAL | Status: DC | PRN

## 2015-02-24 NOTE — ED Notes (Signed)
STATES HAS NOT TAKEN BP MED YET TODAY.

## 2015-02-24 NOTE — ED Provider Notes (Signed)
CSN: [id number]     Arrival date & time 02/24/15  0749 History   First MD Initiated Contact with Patient 02/24/15 [phone]     Chief Complaint  Patient presents with  . Fall  . Arm Injury     (Consider location/radiation/quality/duration/timing/severity/associated sxs/prior Treatment) HPI Comments: Sherry Hall is a 34 y.o. female with a PMHx of DM2, HTN, and HIV, who presents to the ED with complaints of a mechanical fall last night at 11pm resulting in her attempting to catch herself backwards on an outstretched arm. She reports that now she has 8/10 constant right elbow pain which she reports is aching, and radiating into her forearm and upper arm, worse with making a fist or moving the arm, and improved somewhat with holding her arm across her abdomen and a 90 angle, although she is not tried anything else for pain prior to arrival. She denies any head injury or loss of consciousness, denies any dizziness or lightheadedness prior to the fall or at this time. Pt denies headaches, vision changes, numbness, tingling, weakness, chest pain, shortness of breath, abdominal pain, nausea, vomiting, dysuria, hematuria, joint swelling, bruising, abrasions, warmth, neck or back pain. Of note, she did not take her BP meds this morning before leaving the house.   Patient is a 34 y.o. female presenting with arm injury. The history is provided by the patient. No language interpreter was used.  Arm Injury Location:  Elbow Time since incident:  9 hours Injury: yes   Mechanism of injury: fall   Fall:    Fall occurred:  Tripped and standing   Impact surface:  [employer]irs   Point of impact:  Outstretched arms and hands   Entrapped after fall: no   Elbow location:  R elbow Pain details:    Quality:  Aching   Radiates to:  R forearm and R upper arm   Severity:  Moderate   Onset quality:  Gradual   Duration:  9 hours   Timing:  Constant   Progression:  Unchanged Chronicity:  New Dislocation: no     Foreign body present:  No foreign bodies Relieved by:  Being still (holding it against her abdomen at 90 angle) Worsened by:  Movement Ineffective treatments:  None tried Associated symptoms: no back pain, no decreased range of motion, no fever, no muscle weakness, no neck pain, no numbness, no swelling and no tingling     Past Medical History  Diagnosis Date  . Diabetes mellitus without complication   . Hypertension   . HIV (human immunodeficiency virus infection) 2011   Past Surgical History  Procedure Laterality Date  . Cholecystectomy    . Cesarean section  2001, 2008, 2009, 2011  . Tonsillectomy    . Tubal ligation  2011   No family history on file. History  Substance Use Topics  . Smoking status: Current Every Day Smoker -- 0.50 packs/day    Types: Cigarettes  . Smokeless tobacco: Never Used  . Alcohol Use: Yes     Comment: socially   OB History    No data available     Review of Systems  Constitutional: Negative for fever and chills.  HENT: Negative for facial swelling (no head injury).   Eyes: Negative for visual disturbance.  Respiratory: Negative for shortness of breath.   Cardiovascular: Negative for chest pain.  Gastrointestinal: Negative for nausea, vomiting and abdominal pain.  Genitourinary: Negative for dysuria and hematuria.  Musculoskeletal: Positive for arthralgias (R elbow). Negative for myalgias, back  pain, joint swelling and neck pain.  Skin: Negative for color change and wound.  Allergic/Immunologic: Positive for immunocompromised state (HIV and DM).  Neurological: Negative for dizziness, syncope, weakness, light-headedness, numbness and headaches.  Psychiatric/Behavioral: Negative for confusion.   10 Systems reviewed and are negative for acute change except as noted in the HPI.    Allergies  Latex and Percocet  Home Medications   Prior to Admission medications   Medication Sig Start Date End Date Taking? Authorizing Provider   azithromycin (ZITHROMAX) 250 MG tablet Take 1 tablet (250 mg total) by mouth daily. Take first 2 tablets together, then 1 every day until finished. 01/15/13   Antony MaduraKelly Humes, PA-C  Emtricitab-Rilpivir-Tenofovir (COMPLERA) 200-25-300 MG TABS Take 1 tablet by mouth every evening.    Historical Provider, MD  HYDROcodone-acetaminophen (NORCO/VICODIN) 5-325 MG per tablet Take 2 tablets by mouth every 4 (four) hours as needed for pain. 01/15/13   Antony MaduraKelly Humes, PA-C  lisinopril-hydrochlorothiazide (PRINZIDE,ZESTORETIC) 20-25 MG per tablet Take 1 tablet by mouth daily.    Historical Provider, MD  metFORMIN (GLUCOPHAGE) 500 MG tablet Take 500 mg by mouth 2 (two) times daily with a meal.    Historical Provider, MD   BP 183/117 mmHg  Pulse 101  Temp(Src) 98.1 F (36.7 C) (Oral)  Resp 18  Ht 5' (1.524 m)  Wt 280 lb (127.007 kg)  BMI 54.68 kg/m2  SpO2 99%  LMP 02/22/2015 Physical Exam  Constitutional: She is oriented to person, place, and time. Vital signs are normal. She appears well-developed and well-nourished.  Non-toxic appearance. No distress.  Afebrile, nontoxic, NAD  HENT:  Head: Normocephalic and atraumatic.  Mouth/Throat: Mucous membranes are normal.  Eyes: Conjunctivae and EOM are normal. Right eye exhibits no discharge. Left eye exhibits no discharge.  Neck: Normal range of motion. Neck supple.  Cardiovascular: Normal rate and intact distal pulses.   Initially tachy at 101 which resolved  Pulmonary/Chest: Effort normal. No respiratory distress.  Abdominal: Normal appearance. She exhibits no distension.  Musculoskeletal: Normal range of motion.       Right elbow: She exhibits normal range of motion (full PROM, limited AROM due to pain), no swelling, no effusion and no deformity. Tenderness found. Radial head and olecranon process tenderness noted.       Arms: MAE x4 R elbow with limited AROM due to pain, but full PROM. TTP over olecranon process and proximal radius/radial head. No swelling  or effusion, no deformity or skin injury, no bruising or erythema, no warmth. Right shoulder, wrist, and hand all nontender to palpation with no deformity, swelling, abrasions, or bruising. Strength and sensation grossly intact Distal pulses intact   Neurological: She is alert and oriented to person, place, and time. She has normal strength. No sensory deficit.  Skin: Skin is warm, dry and intact. No abrasion, no bruising and no rash noted.  Warm, dry, and intact without erythema or warmth, no bruising or abrasions  Psychiatric: She has a normal mood and affect. Her behavior is normal.  Nursing note and vitals reviewed.   ED Course  Procedures (including critical care time) Labs Review Labs Reviewed - No data to display  Imaging Review Dg Elbow Complete Right  02/24/2015   CLINICAL DATA:  Pain and swelling in right elbow. Pt states she fell last night and tried to brace her fall with there right arm, which hyper extended. She woke up this morning unable to move it due to pain  EXAM: RIGHT ELBOW - COMPLETE 3+ VIEW  COMPARISON:  None.  FINDINGS: There is no evidence of fracture, dislocation, or joint effusion. There is no evidence of arthropathy or other focal bone abnormality. Soft tissues are unremarkable.  IMPRESSION: Negative.   Electronically Signed   By: Amie Portland M.D.   On: 02/24/2015 09:15     EKG Interpretation None      MDM   Final diagnoses:  Right elbow pain  Elbow sprain, right, initial encounter  HTN (hypertension), benign    34 y.o. female right elbow pain after falling backwards onto a staircase. This is mechanical fall with no dizziness or lightheadedness prior to the fall. On exam arm is neurovascularly intact with soft compartments, with tenderness over radial head and olecranon process, no crepitus or deformity, full range of motion still intact although painful. Will obtain x-rays and give pain control, then reassess after imaging. Of note patient has not  taken her blood pressure medication this morning and is noted to be hypertensive, but with no symptoms of hypertension therefore doubt need for any further workup at this time.  9:32 AM HTN and HR improving after pain control. Xray neg. Will ace wrap and sling. Discussed ROM of shoulder to avoid frozen shoulder. Discussed RICE therapy and ortho f/up in 1-2wks. Will give pain control script. I explained the diagnosis and have given explicit precautions to return to the ER including for any other new or worsening symptoms. The patient understands and accepts the medical plan as it's been dictated and I have answered their questions. Discharge instructions concerning home care and prescriptions have been given. The patient is STABLE and is discharged to home in good condition.  BP 162/92 mmHg  Pulse 99  Temp(Src) 98.7 F (37.1 C) (Oral)  Resp 18  Ht 5' (1.524 m)  Wt 280 lb (127.007 kg)  BMI 54.68 kg/m2  SpO2 100%  LMP 02/22/2015  Meds ordered this encounter  Medications  . HYDROcodone-acetaminophen (NORCO/VICODIN) 5-325 MG per tablet 1 tablet    Sig:   . HYDROcodone-acetaminophen (NORCO) 5-325 MG per tablet    Sig: Take 1 tablet by mouth every 6 (six) hours as needed for severe pain.    Dispense:  6 tablet    Refill:  0    Order Specific Question:  Supervising Provider    Answer:  Eber Hong [[id number]]      Sherry Kuklinski Camprubi-Soms, PA-C 02/24/15 [id number]  Blake Divine, MD 02/28/15 0800

## 2015-02-24 NOTE — ED Notes (Signed)
States feels much better in sling.

## 2015-02-24 NOTE — Discharge Instructions (Signed)
Wear elbow ace wrap and use shoulder sling as needed for comfort. Remember to always perform range of motion exercises at the shoulder every day. Ice and elevate elbow throughout the day. Alternate between ibuprofen and norco for pain relief. Do not drive or operate machinery with pain medication use. Call orthopedic follow up today or tomorrow to schedule followup appointment for recheck of ongoing elbow pain in 1-2 weeks that can be canceled with a 24-48 hour notice if complete resolution of pain. Return to the ER for changes or worsening symptoms.    Joint Sprain A sprain is a tear or stretch in the ligaments that hold a joint together. Severe sprains may need as long as 3-6 weeks of immobilization and/or exercises to heal completely. Sprained joints should be rested and protected. If not, they can become unstable and prone to re-injury. Proper treatment can reduce your pain, shorten the period of disability, and reduce the risk of repeated injuries. TREATMENT   Rest and elevate the injured joint to reduce pain and swelling.  Apply ice packs to the injury for 20-30 minutes every 2-3 hours for the next 2-3 days.  Keep the injury wrapped in a compression bandage or splint as long as the joint is painful or as instructed by your caregiver.  Do not use the injured joint until it is completely healed to prevent re-injury and chronic instability. Follow the instructions of your caregiver.  Long-term sprain management may require exercises and/or treatment by a physical therapist. Taping or special braces may help stabilize the joint until it is completely better. SEEK MEDICAL CARE IF:   You develop increased pain or swelling of the joint.  You develop increasing redness and warmth of the joint.  You develop a fever.  It becomes stiff.  Your hand or foot gets cold or numb. Document Released: 11/12/2004 Document Revised: 12/28/2011 Document Reviewed: 10/22/2008 [facility] Patient Information  2015 [place], [place]. This information is not intended to replace advice given to you by your health care provider. Make sure you discuss any questions you have with your health care provider.  Cryotherapy Cryotherapy means treatment with cold. Ice or gel packs can be used to reduce both pain and swelling. Ice is the most helpful within the first 24 to 48 hours after an injury or flare-up from overusing a muscle or joint. Sprains, strains, spasms, burning pain, shooting pain, and aches can all be eased with ice. Ice can also be used when recovering from surgery. Ice is effective, has very few side effects, and is safe for most people to use. PRECAUTIONS  Ice is not a safe treatment option for people with:  Raynaud phenomenon. This is a condition affecting small blood vessels in the extremities. Exposure to cold may cause your problems to return.  Cold hypersensitivity. There are many forms of cold hypersensitivity, including:  Cold urticaria. Red, itchy hives appear on the skin when the tissues begin to warm after being iced.  Cold erythema. This is a red, itchy rash caused by exposure to cold.  Cold hemoglobinuria. Red blood cells break down when the tissues begin to warm after being iced. The hemoglobin that carry oxygen are passed into the urine because they cannot combine with blood proteins fast enough.  Numbness or altered sensitivity in the area being iced. If you have any of the following conditions, do not use ice until you have discussed cryotherapy with your caregiver:  Heart conditions, such as arrhythmia, angina, or chronic heart disease.  High  blood pressure.  Healing wounds or open skin in the area being iced.  Current infections.  Rheumatoid arthritis.  Poor circulation.  Diabetes. Ice slows the blood flow in the region it is applied. This is beneficial when trying to stop inflamed tissues from spreading irritating chemicals to surrounding tissues. However, if you  expose your skin to cold temperatures for too long or without the proper protection, you can damage your skin or nerves. Watch for signs of skin damage due to cold. HOME CARE INSTRUCTIONS Follow these tips to use ice and cold packs safely.  Place a dry or damp towel between the ice and skin. A damp towel will cool the skin more quickly, so you may need to shorten the time that the ice is used.  For a more rapid response, add gentle compression to the ice.  Ice for no more than 10 to 20 minutes at a time. The bonier the area you are icing, the less time it will take to get the benefits of ice.  Check your skin after 5 minutes to make sure there are no signs of a poor response to cold or skin damage.  Rest 20 minutes or more between uses.  Once your skin is numb, you can end your treatment. You can test numbness by very lightly touching your skin. The touch should be so light that you do not see the skin dimple from the pressure of your fingertip. When using ice, most people will feel these normal sensations in this order: cold, burning, aching, and numbness.  Do not use ice on someone who cannot communicate their responses to pain, such as small children or people with dementia. HOW TO MAKE AN ICE PACK Ice packs are the most common way to use ice therapy. Other methods include ice massage, ice baths, and cryosprays. Muscle creams that cause a cold, tingly feeling do not offer the same benefits that ice offers and should not be used as a substitute unless recommended by your caregiver. To make an ice pack, do one of the following:  Place crushed ice or a bag of frozen vegetables in a sealable plastic bag. Squeeze out the excess air. Place this bag inside another plastic bag. Slide the bag into a pillowcase or place a damp towel between your skin and the bag.  Mix 3 parts water with 1 part rubbing alcohol. Freeze the mixture in a sealable plastic bag. When you remove the mixture from the freezer,  it will be slushy. Squeeze out the excess air. Place this bag inside another plastic bag. Slide the bag into a pillowcase or place a damp towel between your skin and the bag. SEEK MEDICAL CARE IF:  You develop white spots on your skin. This may give the skin a blotchy (mottled) appearance.  Your skin turns blue or pale.  Your skin becomes waxy or hard.  Your swelling gets worse. MAKE SURE YOU:   Understand these instructions.  Will watch your condition.  Will get help right away if you are not doing well or get worse. Document Released: 06/01/2011 Document Revised: 02/19/2014 Document Reviewed: 06/01/2011 [facility] Patient Information 2015 [place], [place]. This information is not intended to replace advice given to you by your health care provider. Make sure you discuss any questions you have with your health care provider.

## 2015-02-24 NOTE — ED Notes (Signed)
Ice applied to right elbow and elbow elevated for comfort while pt holds right arm close to her body w/left.

## 2015-02-24 NOTE — ED Notes (Signed)
States tripped and fell injurying right elbow to hand - attempting to catch herself. States painful to move fingers. Radial pulse palpable. Denies LOC or hitting head.

## 2015-06-05 DIAGNOSIS — R202 Paresthesia of skin: Secondary | ICD-10-CM

## 2015-06-05 DIAGNOSIS — R200 Anesthesia of skin: Secondary | ICD-10-CM | POA: Insufficient documentation

## 2015-08-20 ENCOUNTER — Telehealth: Payer: Self-pay

## 2015-08-20 NOTE — Telephone Encounter (Signed)
Patient was contacted for new transfer appointment.   [facility] I have advised her to that office to see if they are able to fax labs.  If so we will not draw labs on 09-02-15.   Office visit scheduled with Dr Ninetta LightsHatcher on 09-16-15 @ 9:00 am.   Laurell Josephsammy K King, RN

## 2015-09-02 ENCOUNTER — Other Ambulatory Visit: Payer: Medicaid Other

## 2015-09-16 ENCOUNTER — Ambulatory Visit (INDEPENDENT_AMBULATORY_CARE_PROVIDER_SITE_OTHER): Payer: Medicaid Other | Admitting: Infectious Diseases

## 2015-09-16 ENCOUNTER — Encounter: Payer: Self-pay | Admitting: *Deleted

## 2015-09-16 ENCOUNTER — Encounter: Payer: Self-pay | Admitting: Infectious Diseases

## 2015-09-16 VITALS — BP 170/108 | HR 97 | Temp 97.7°F | Ht 60.0 in | Wt 256.0 lb

## 2015-09-16 DIAGNOSIS — B20 Human immunodeficiency virus [HIV] disease: Secondary | ICD-10-CM | POA: Diagnosis present

## 2015-09-16 DIAGNOSIS — Z79899 Other long term (current) drug therapy: Secondary | ICD-10-CM | POA: Diagnosis not present

## 2015-09-16 DIAGNOSIS — E10610 Type 1 diabetes mellitus with diabetic neuropathic arthropathy: Secondary | ICD-10-CM

## 2015-09-16 DIAGNOSIS — E109 Type 1 diabetes mellitus without complications: Secondary | ICD-10-CM | POA: Insufficient documentation

## 2015-09-16 DIAGNOSIS — Z113 Encounter for screening for infections with a predominantly sexual mode of transmission: Secondary | ICD-10-CM | POA: Diagnosis not present

## 2015-09-16 DIAGNOSIS — I10 Essential (primary) hypertension: Secondary | ICD-10-CM | POA: Diagnosis not present

## 2015-09-16 DIAGNOSIS — Z23 Encounter for immunization: Secondary | ICD-10-CM | POA: Diagnosis not present

## 2015-09-16 DIAGNOSIS — F32A Depression, unspecified: Secondary | ICD-10-CM

## 2015-09-16 DIAGNOSIS — F329 Major depressive disorder, single episode, unspecified: Secondary | ICD-10-CM

## 2015-09-16 MED ORDER — EMTRICITAB-RILPIVIR-TENOFOV AF 200-25-25 MG PO TABS: 1.0000 | ORAL_TABLET | Freq: Every day | ORAL | Status: DC

## 2015-09-16 NOTE — Assessment & Plan Note (Signed)
She will resume her meds Get her in with PCP

## 2015-09-16 NOTE — Progress Notes (Signed)
   Subjective:    Patient ID: Sherry Hall, female    DOB: [birth year], 34 y.o.   MRN: [id number]  HPI 34 yo F with hx of HIV+ since 2011, [facility] Has had issues with making f/u appt's there. [place].  No HIV related hospitalizations. Has been taking complera, zofran.  DM1 since 34 yo, takes metformin, lisinopril/hctz (has not taken today).  Has been off her medications for depression- has not seen therapist for 1.5 years. [facility]  Today complains of headache.  Has 667 yo son, he is HIV-. Has 36 yo son as well, HIV- also.  Her fiance is HIV+. [facility]   Her last PAP was 2013.  Last ophtho 2013.   PMhx, Soc, Fhx reviewed, updated in epic.   No results found for: HIV1RNAQUANT, HIV1RNAVL, CD4TABS   Review of Systems  Constitutional: Negative for appetite change and unexpected weight change.  Respiratory: Negative for cough and shortness of breath.   Cardiovascular: Negative for chest pain.  Gastrointestinal: Positive for nausea. Negative for constipation and blood in stool.  Genitourinary: Positive for menstrual problem. Negative for difficulty urinating.  Neurological: Positive for numbness and headaches. Negative for dizziness.  heavy periods last 6 months.  Having episodes of numbness, random, of hands and feet. Has had MRI to eval  Was told she needs Neuro eval, MRi was (-).   Walks for exercise. Is working on losing wt.  Wearing seat belt.      Objective:   Physical Exam  Constitutional: She appears well-developed and well-nourished.  HENT:  Mouth/Throat: No oropharyngeal exudate.  Eyes: EOM are normal. Pupils are equal, round, and reactive to light.  Neck: Neck supple.  Cardiovascular: Normal rate, regular rhythm and normal heart sounds.   Pulmonary/Chest: Effort normal and breath sounds normal.  Abdominal: Soft. Bowel sounds are normal. There is no tenderness. There is no rebound.    Musculoskeletal: She exhibits no edema.  Lymphadenopathy:    She has no cervical adenopathy.       Assessment & Plan:

## 2015-09-16 NOTE — Assessment & Plan Note (Signed)
She appears to be doing well.  Will change her to odefsy Will give her condoms as requested.

## 2015-09-16 NOTE — Assessment & Plan Note (Signed)
[facility]

## 2015-09-16 NOTE — Assessment & Plan Note (Signed)
Will get her in with PCP Will get her in for PAP

## 2015-09-24 ENCOUNTER — Ambulatory Visit: Payer: Medicaid Other | Admitting: Family Medicine

## 2015-11-22 ENCOUNTER — Ambulatory Visit: Payer: Medicaid Other

## 2015-12-01 ENCOUNTER — Encounter (HOSPITAL_COMMUNITY): Payer: Self-pay | Admitting: Emergency Medicine

## 2015-12-01 ENCOUNTER — Emergency Department (HOSPITAL_COMMUNITY)
Admission: EM | Admit: 2015-12-01 | Discharge: 2015-12-01 | Disposition: A | Discharge status: Home / Self Care | Payer: Medicaid Other | Attending: Emergency Medicine | Admitting: Emergency Medicine

## 2015-12-01 DIAGNOSIS — B20 Human immunodeficiency virus [HIV] disease: Secondary | ICD-10-CM | POA: Insufficient documentation

## 2015-12-01 DIAGNOSIS — F17210 Nicotine dependence, cigarettes, uncomplicated: Secondary | ICD-10-CM | POA: Diagnosis not present

## 2015-12-01 DIAGNOSIS — I10 Essential (primary) hypertension: Secondary | ICD-10-CM | POA: Insufficient documentation

## 2015-12-01 DIAGNOSIS — X58XXXA Exposure to other specified factors, initial encounter: Secondary | ICD-10-CM | POA: Diagnosis not present

## 2015-12-01 DIAGNOSIS — Y9289 Other specified places as the place of occurrence of the external cause: Secondary | ICD-10-CM | POA: Insufficient documentation

## 2015-12-01 DIAGNOSIS — T192XXA Foreign body in vulva and vagina, initial encounter: Secondary | ICD-10-CM | POA: Insufficient documentation

## 2015-12-01 DIAGNOSIS — Y9389 Activity, other specified: Secondary | ICD-10-CM | POA: Insufficient documentation

## 2015-12-01 DIAGNOSIS — Y998 Other external cause status: Secondary | ICD-10-CM | POA: Diagnosis not present

## 2015-12-01 DIAGNOSIS — E109 Type 1 diabetes mellitus without complications: Secondary | ICD-10-CM | POA: Insufficient documentation

## 2015-12-01 NOTE — ED Notes (Signed)
Pt went to secretary desk to advise she is leaving.

## 2015-12-01 NOTE — ED Notes (Signed)
Pt states she was drinking on Friday and had a tampon in on Friday and she doesn't remember taking it out. Pt states her cycle has been abnormal since starting her period Thursday.

## 2015-12-02 ENCOUNTER — Emergency Department (HOSPITAL_COMMUNITY)
Admission: EM | Admit: 2015-12-02 | Discharge: 2015-12-02 | Disposition: A | Discharge status: Home / Self Care | Payer: Medicaid Other | Attending: Emergency Medicine | Admitting: Emergency Medicine

## 2015-12-02 ENCOUNTER — Encounter (HOSPITAL_COMMUNITY): Payer: Self-pay | Admitting: Emergency Medicine

## 2015-12-02 DIAGNOSIS — Z8659 Personal history of other mental and behavioral disorders: Secondary | ICD-10-CM | POA: Insufficient documentation

## 2015-12-02 DIAGNOSIS — Y9289 Other specified places as the place of occurrence of the external cause: Secondary | ICD-10-CM | POA: Diagnosis not present

## 2015-12-02 DIAGNOSIS — F17210 Nicotine dependence, cigarettes, uncomplicated: Secondary | ICD-10-CM | POA: Insufficient documentation

## 2015-12-02 DIAGNOSIS — I10 Essential (primary) hypertension: Secondary | ICD-10-CM | POA: Diagnosis not present

## 2015-12-02 DIAGNOSIS — Y9389 Activity, other specified: Secondary | ICD-10-CM | POA: Insufficient documentation

## 2015-12-02 DIAGNOSIS — T192XXA Foreign body in vulva and vagina, initial encounter: Secondary | ICD-10-CM | POA: Insufficient documentation

## 2015-12-02 DIAGNOSIS — Z7984 Long term (current) use of oral hypoglycemic drugs: Secondary | ICD-10-CM | POA: Diagnosis not present

## 2015-12-02 DIAGNOSIS — Z91040 Latex allergy status: Secondary | ICD-10-CM | POA: Diagnosis not present

## 2015-12-02 DIAGNOSIS — E109 Type 1 diabetes mellitus without complications: Secondary | ICD-10-CM | POA: Diagnosis not present

## 2015-12-02 DIAGNOSIS — Z79899 Other long term (current) drug therapy: Secondary | ICD-10-CM | POA: Diagnosis not present

## 2015-12-02 DIAGNOSIS — B20 Human immunodeficiency virus [HIV] disease: Secondary | ICD-10-CM | POA: Diagnosis not present

## 2015-12-02 DIAGNOSIS — E669 Obesity, unspecified: Secondary | ICD-10-CM | POA: Insufficient documentation

## 2015-12-02 DIAGNOSIS — X58XXXA Exposure to other specified factors, initial encounter: Secondary | ICD-10-CM | POA: Diagnosis not present

## 2015-12-02 DIAGNOSIS — Y998 Other external cause status: Secondary | ICD-10-CM | POA: Diagnosis not present

## 2015-12-02 NOTE — ED Notes (Signed)
Pt reports possible tampon left in since Friday. Pt remembers putting in a regular tampon on Friday but does not remember taking it out. Pt reports vaginal odor.

## 2015-12-02 NOTE — Discharge Instructions (Signed)
Vaginal Foreign Body °A vaginal foreign body is any object that gets stuck or left inside the vagina. Vaginal foreign bodies left in the vagina for a long time can cause irritation and infection. In most cases, symptoms go away once the vaginal foreign body is found and removed. Rarely, a foreign object can break through the walls of the vagina and cause a serious infection inside the abdomen.  °CAUSES  °The most common vaginal foreign bodies are: °· Tampons. °· Contraceptive devices. °· Toilet tissue left in the vagina. °· Small objects that were placed in the vagina out of curiosity and got stuck. °· A result of sexual abuse.  °SIGNS AND SYMPTOMS °· Light vaginal bleeding. °· Blood-tinged vaginal fluid (discharge). °· Vaginal discharge that smells bad. °· Vaginal itching or burning. °· Redness, swelling, or rash near the opening of the vagina. °· Abdominal pain. °· Fever. °· Burning or frequent urination. °DIAGNOSIS  °Your health care provider may be able to diagnose a vaginal foreign body based on the information you provide, your symptoms, and a physical exam. Your health care provider may also perform the following tests to check for infection: °· A swab of the discharge to check under a microscope for bacteria (culture). °· A urine culture. °· An examination of the vagina with a small, lighted scope (vaginoscopy). °· Imaging tests to get a picture of the inside of your vagina, such as: °¨ Ultrasound. °¨ X-ray. °¨ MRI. °TREATMENT  °In most cases, a vaginal foreign body can be easily removed and the symptoms usually go away very quickly. Other treatment may include:  °· If the vaginal foreign body is not easily removed, medicine may be given to make you go to sleep (general anesthesia) to have the object removed. °· Emergency surgery may be necessary if an infection spreads through the walls of the vagina into the abdomen (acute abdomen). This is rare. °· You may need to take antibiotic medicine if you have a  vaginal or urinary tract infection. °HOME CARE INSTRUCTIONS  °· Take medicines only as directed by your health care provider. °· If you were prescribed an antibiotic medicine, finish it all even if you start to feel better. °· Do not have sex or use tampons until your health care provider says it is okay. °· Do not douche or use vaginal rinses unless your health care provider recommends it. °· Keep all follow-up visits as directed by your health care provider. This is important. °SEEK MEDICAL CARE IF: °· You have abdominal pain or burning pain when urinating. °· You have a fever. °SEEK IMMEDIATE MEDICAL CARE IF: °· You have heavy vaginal bleeding or discharge.   °· You have very bad abdominal pain.   °MAKE SURE YOU: °· Understand these instructions. °· Will watch your condition. °· Will get help right away if you are not doing well or get worse. °  °This information is not intended to replace advice given to you by your health care provider. Make sure you discuss any questions you have with your health care provider. °  °Document Released: 02/19/2014 Document Reviewed: 02/19/2014 °[employer]vier Interactive Patient Education ©2016 [employer] ° °

## 2015-12-02 NOTE — ED Provider Notes (Signed)
CSN: [id number]     Arrival date & time 12/02/15  1021 History   First MD Initiated Contact with Patient 12/02/15 1502     Chief Complaint  Patient presents with  . Foreign Body in Vagina    HPI   35 year old female presents today with foreign body in her vagina. Patient reports that on 5 days ago she started her normal menstrual cycle, reports placing a tampon in the following day, having several drinks. She woke up the next day and was unable to find the tampon. She is uncertain if she took it out the night before she was intoxicated. She is attempted finding it numerous times and has been unable to. She reports an odor coming from her vagina today. She denies any discharge, abdominal pain, fever, chills, nausea, vomiting, or any other concerning signs or symptoms.   Past Medical History  Diagnosis Date  . Diabetes mellitus type 1 (HCC)   . Hypertension   . HIV (human immunodeficiency virus infection) (HCC) 2011  . Depression    Past Surgical History  Procedure Laterality Date  . Cholecystectomy    . Cesarean section  2001, 2008, 2009, 2011  . Tonsillectomy    . Tubal ligation  2011   Family History  Problem Relation Age of Onset  . Diabetes Father   . Hypertension Father   . Cancer Mother     cervical    Social History  Substance Use Topics  . Smoking status: Current Every Day Smoker -- 0.50 packs/day    Types: Cigarettes  . Smokeless tobacco: Never Used  . Alcohol Use: 0.6 oz/week    1 Standard drinks or equivalent per week     Comment: socially   OB History    No data available     Review of Systems  All other systems reviewed and are negative.   Allergies  Latex and Percocet  Home Medications   Prior to Admission medications   Medication Sig Start Date End Date Taking? Authorizing Provider  emtricitabine-rilpivir-tenofovir AF (ODEFSEY) 200-25-25 MG TABS tablet Take 1 tablet by mouth daily with breakfast. 09/16/15   Ginnie Smart, MD   HYDROcodone-acetaminophen (NORCO) 5-325 MG per tablet Take 1 tablet by mouth every 6 (six) hours as needed for severe pain. 02/24/15   Mercedes Camprubi-Soms, PA-C  lisinopril-hydrochlorothiazide (PRINZIDE,ZESTORETIC) 20-25 MG per tablet Take 1 tablet by mouth daily.    Historical Provider, MD  metFORMIN (GLUCOPHAGE) 500 MG tablet Take 500 mg by mouth 2 (two) times daily with a meal.    Historical Provider, MD   BP 145/86 mmHg  Pulse 89  Temp(Src) 98.3 F (36.8 C) (Oral)  Resp 14  Ht 5' (1.524 m)  Wt 123.152 kg  BMI 53.02 kg/m2  SpO2 100%  LMP 11/28/2015   Physical Exam  Constitutional: She is oriented to person, place, and time. She appears well-developed and well-nourished.  HENT:  Head: Normocephalic and atraumatic.  Eyes: Conjunctivae are normal. Pupils are equal, round, and reactive to light. Right eye exhibits no discharge. Left eye exhibits no discharge. No scleral icterus.  Neck: Normal range of motion. No JVD present. No tracheal deviation present.  Pulmonary/Chest: Effort normal. No stridor.  Abdominal: Soft. She exhibits no distension and no mass. There is no tenderness. There is no rebound and no guarding.  Genitourinary: There is bleeding in the vagina. No erythema or tenderness in the vagina. No foreign body around the vagina. No signs of injury around the vagina. No vaginal discharge  found.  Neurological: She is alert and oriented to person, place, and time. Coordination normal.  Skin: Skin is warm and dry. No rash noted. No erythema. No pallor.  Psychiatric: She has a normal mood and affect. Her behavior is normal. Judgment and thought content normal.  Nursing note and vitals reviewed.    ED Course  Procedures (including critical care time) Labs Review Labs Reviewed - No data to display  Imaging Review No results found. I have personally reviewed and evaluated these images and lab results as part of my medical decision-making.   EKG Interpretation None       MDM   Final diagnoses:  Vaginal foreign body, initial encounter    Labs:   Imaging:  Consults:  Therapeutics:  Discharge Meds:   Assessment/Plan:  Obese female presents today with complaints of foreign body. Patient reports she put a tampon in Friday night, Saturday morning after heavy drinking the night before was uncertain if she had removed. She was not able to find it, her significant other was unable to find it, I was unable to find a foreign body on my exam. I performed both visual and manual exam, no foreign body noted. Patient had very minimal bleeding, no significant abnormalities, no discharge. She was discharged home with instructions to follow-up with OB/GYN for further evaluation and management. She is given strict return percussion, verbalized understanding and agreement for today's plan.        Eyvonne Mechanic, PA-C 12/02/15 1547  Glynn Octave, MD 12/03/15 [phone]

## 2015-12-03 ENCOUNTER — Ambulatory Visit: Payer: Medicaid Other

## 2015-12-03 ENCOUNTER — Encounter: Payer: Medicaid Other | Admitting: Infectious Diseases

## 2015-12-03 DIAGNOSIS — Z79899 Other long term (current) drug therapy: Secondary | ICD-10-CM

## 2015-12-03 DIAGNOSIS — Z113 Encounter for screening for infections with a predominantly sexual mode of transmission: Secondary | ICD-10-CM

## 2015-12-03 DIAGNOSIS — B20 Human immunodeficiency virus [HIV] disease: Secondary | ICD-10-CM

## 2015-12-17 ENCOUNTER — Ambulatory Visit (INDEPENDENT_AMBULATORY_CARE_PROVIDER_SITE_OTHER): Payer: Medicaid Other | Admitting: Infectious Diseases

## 2015-12-17 ENCOUNTER — Encounter: Payer: Self-pay | Admitting: Infectious Diseases

## 2015-12-17 ENCOUNTER — Ambulatory Visit: Payer: Medicaid Other | Admitting: *Deleted

## 2015-12-17 VITALS — BP 165/91 | HR 101 | Temp 98.0°F | Wt 274.0 lb

## 2015-12-17 DIAGNOSIS — F329 Major depressive disorder, single episode, unspecified: Secondary | ICD-10-CM

## 2015-12-17 DIAGNOSIS — I10 Essential (primary) hypertension: Secondary | ICD-10-CM | POA: Diagnosis not present

## 2015-12-17 DIAGNOSIS — F32A Depression, unspecified: Secondary | ICD-10-CM

## 2015-12-17 DIAGNOSIS — B20 Human immunodeficiency virus [HIV] disease: Secondary | ICD-10-CM | POA: Diagnosis not present

## 2015-12-17 NOTE — Assessment & Plan Note (Signed)
She has new pcp appt pending.

## 2015-12-17 NOTE — Progress Notes (Signed)
   Subjective:    Patient ID: Sherry Hall, female    DOB: 07-13-1981, 35 y.o.   MRN: [id number]  HPI 35 yo F with hx of HIV+ since 2011, previously followed at [facility]. Has had issues with making f/u appt's there. Lives in [place] now.  No HIV related hospitalizations. Has been taking complera, zofran.  DM1 since 35 yo, takes metformin, lisinopril/hctz Had first [facility] visit 08-2015 and was changed to [facility], referred to PCP.  Has been on low card diet, trying to lose wt (has gained 18# since 08-2015). Has been drinking more water. Was walking for exercise, less due to weather. Needs money for gym membership.   HIV 1 RNA QUANT (copies/mL)  Date Value  12/03/2015 293*   CD4 T CELL ABS (/uL)  Date Value  12/03/2015 450   Has not taken anti-htn rx today (usually takes at night).  FSG are ? Has appt, reschedule.  Has been trying to get into [facility], unsuccessfully.   Review of Systems  Constitutional: Positive for fatigue and unexpected weight change. Negative for appetite change.  Respiratory: Negative for cough and shortness of breath.   Cardiovascular: Negative for chest pain.  Gastrointestinal: Negative for nausea and diarrhea.  Genitourinary: Negative for difficulty urinating.  Neurological: Negative for headaches.       Objective:   Physical Exam  Constitutional: She appears well-developed and well-nourished.  HENT:  Mouth/Throat: No oropharyngeal exudate.  Eyes: EOM are normal. Pupils are equal, round, and reactive to light.  Neck: Neck supple.  Cardiovascular: Normal rate, regular rhythm and normal heart sounds.   Pulmonary/Chest: Effort normal and breath sounds normal.  Abdominal: Soft. Bowel sounds are normal. There is no tenderness. There is no rebound.  Musculoskeletal: She exhibits no edema.  Lymphadenopathy:    She has no cervical adenopathy.       Assessment & Plan:

## 2015-12-17 NOTE — Assessment & Plan Note (Signed)
Given condoms Will recheck her VL today Will check her B S Ab today.  Will see her back in 3-4 months Will get her in with Augusto Gamble Will get her inwith dental

## 2015-12-17 NOTE — BH Specialist Note (Signed)
Counselor met with Sherry Hall today as a result of observation by Dr. Johnnye Sima that patient is depressed and not adjusting well to all that is going on in her life. Patient was oriented times four with good affect and dress.  Patient was a alert, talkative, and shared that she needed to sit with someone who is objective and give her advice.  Counselor educated patient that advice would not be given but support and the skills to cope better with life's difficulties.  Counselor provided support and encouragement.  Counselor recommended that patient make an appointment to meet and process what is going on in her life right now. Patient agreed and made an appointment for next week.  Rolena Infante, MA [facility]

## 2015-12-18 LAB — HEPATITIS B SURFACE ANTIBODY,QUALITATIVE: Hep B S Ab: NEGATIVE

## 2015-12-18 LAB — HIV-1 RNA QUANT-NO REFLEX-BLD
HIV 1 RNA Quant: 608 copies/mL — ABNORMAL HIGH (ref <20)
HIV-1 RNA Quant, Log: 2.78 Log copies/mL — ABNORMAL HIGH (ref <1.30)

## 2015-12-26 ENCOUNTER — Ambulatory Visit: Payer: Medicaid Other | Admitting: *Deleted

## 2015-12-26 DIAGNOSIS — F32A Depression, unspecified: Secondary | ICD-10-CM

## 2015-12-26 DIAGNOSIS — F329 Major depressive disorder, single episode, unspecified: Secondary | ICD-10-CM
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
## 2016-01-03 ENCOUNTER — Encounter: Payer: Self-pay | Admitting: Family Medicine

## 2016-01-03 ENCOUNTER — Ambulatory Visit (INDEPENDENT_AMBULATORY_CARE_PROVIDER_SITE_OTHER): Payer: Medicaid Other | Admitting: Family Medicine

## 2016-01-03 VITALS — BP 136/68 | HR 91 | Temp 98.1°F | Resp 16 | Ht 60.0 in | Wt 274.0 lb

## 2016-01-03 DIAGNOSIS — I10 Essential (primary) hypertension: Secondary | ICD-10-CM

## 2016-01-03 DIAGNOSIS — E669 Obesity, unspecified: Secondary | ICD-10-CM

## 2016-01-03 DIAGNOSIS — E785 Hyperlipidemia, unspecified: Secondary | ICD-10-CM

## 2016-01-03 DIAGNOSIS — E119 Type 2 diabetes mellitus without complications: Secondary | ICD-10-CM | POA: Diagnosis not present

## 2016-01-03 DIAGNOSIS — F32A Depression, unspecified: Secondary | ICD-10-CM

## 2016-01-03 DIAGNOSIS — E118 Type 2 diabetes mellitus with unspecified complications: Secondary | ICD-10-CM

## 2016-01-03 DIAGNOSIS — Z23 Encounter for immunization: Secondary | ICD-10-CM

## 2016-01-03 DIAGNOSIS — B20 Human immunodeficiency virus [HIV] disease: Secondary | ICD-10-CM | POA: Diagnosis not present

## 2016-01-03 DIAGNOSIS — F329 Major depressive disorder, single episode, unspecified: Secondary | ICD-10-CM

## 2016-01-03 DIAGNOSIS — F411 Generalized anxiety disorder: Secondary | ICD-10-CM

## 2016-01-03 DIAGNOSIS — F17200 Nicotine dependence, unspecified, uncomplicated: Secondary | ICD-10-CM

## 2016-01-03 DIAGNOSIS — Z794 Long term (current) use of insulin: Secondary | ICD-10-CM

## 2016-01-03 DIAGNOSIS — E1065 Type 1 diabetes mellitus with hyperglycemia: Secondary | ICD-10-CM | POA: Insufficient documentation

## 2016-01-03 DIAGNOSIS — K0889 Other specified disorders of teeth and supporting structures: Secondary | ICD-10-CM

## 2016-01-03 DIAGNOSIS — G4730 Sleep apnea, unspecified: Secondary | ICD-10-CM

## 2016-01-03 LAB — POCT URINALYSIS DIP (DEVICE)
Bilirubin Urine: NEGATIVE
Glucose, UA: 500 mg/dL — AB
KETONES UR: NEGATIVE mg/dL
LEUKOCYTES UA: NEGATIVE
Nitrite: NEGATIVE
Protein, ur: 30 mg/dL — AB
SPECIFIC GRAVITY, URINE: 1.02 (ref 1.005–1.030)
Urobilinogen, UA: 0.2 mg/dL (ref 0.0–1.0)
pH: 6.5 (ref 5.0–8.0)

## 2016-01-03 LAB — TSH: TSH: 1.15 m[IU]/L

## 2016-01-03 MED ORDER — ATORVASTATIN CALCIUM 10 MG PO TABS: 10.0000 mg | ORAL_TABLET | Freq: Every day | ORAL | Status: DC

## 2016-01-03 MED ORDER — BUSPIRONE HCL 7.5 MG PO TABS: 7.5000 mg | ORAL_TABLET | Freq: Two times a day (BID) | ORAL | Status: DC

## 2016-01-03 NOTE — Progress Notes (Signed)
Subjective:    Patient ID: Sherry Hall, female    DOB: 12-29-1980, 35 y.o.   MRN: [id number]  HPI Sherry Hall, a 35 year old female with a history of DMII, hypertension, HIV, and depression presents to establish care. Patient does not currently have a primary provider, she has been using her infectious disease provider and the emergency department for all primary care needs.  She maintains that she has been followed by Dr. Bobby Rumpf, infectious disease for HIV.  She maintains that she takes her medications consistently. She was last seen on 12/17/2015, she states that she had problems with follow up appointments in the past.   Sherry Hall has a history of type 2 diabetes mellitus. She states that she was diagnosed at age 89. She is currently on Metformin 500 mg twice daily. She has gained a total of 18 pounds since November and is morbidly obese. She states that she has been eating fast food and eating 2 large meals per day. She reports fatigue and polyuria.  Patient denies foot ulcerations, hyperglycemia, paresthesia of the feet, polydipsia, visual disturbances, vomitting and weight loss.  Previous hemoglobin a1C was greater than 1 year ago and patient does not check blood sugars.   She also has a history of hypertension.  She is not exercising and is not adherent to low salt diet.  Patient is not currently checking blood sugars at home.  Patient denies chest pain, dyspnea, irregular heart beat, syncope and tachypnea.  Cardiovascular risk factors include: she is an everyday tobacco smoker, morbid obesity, and sedentary lifestyle.   Ms. Desantis complains of depression and anxiety. She states that she has been experiencing symptoms for many years. She has attempted to walk in at [facility] on several occasions, but left without been evaluated due to wait times. She has also met with a counselor at infectious disease without success. She maintains that she has  been on medications in the past, but has never completed counseling.  She has the following symptoms: anhedonia,  difficulty concentrating, feelings of losing control, insomnia, irritable, racing thoughts. Onset of symptoms was many years ago after losing her mother at age 80 from cervical cancer. Symptoms occur intermittently. She denies current suicidal and homicidal ideation.   Past Medical History  Diagnosis Date  . Diabetes mellitus type 1 ([place])   . Hypertension   . HIV (human immunodeficiency virus infection) ([place]) 2011  . Depression    Immunization History  Administered Date(s) Administered  . Hepatitis A, Adult 01/28/2010, 03/11/2010  . Hepatitis B 01/28/2010, 03/11/2010  . Influenza,inj,Quad PF,36+ Mos 09/16/2015  . PPD Test 01/11/2013  . Pneumococcal Polysaccharide-23 01/28/2010, 10/20/2012  . Tdap 01/11/2013   Social History   Social History  . Marital Status: Single    Spouse Name: N/A  . Number of Children: N/A  . Years of Education: N/A   Occupational History  . Not on file.   Social History Main Topics  . Smoking status: Current Every Day Smoker -- 0.25 packs/day    Types: Cigarettes  . Smokeless tobacco: Never Used     Comment: trying to slow  . Alcohol Use: 0.6 oz/week    1 Standard drinks or equivalent per week     Comment: socially  . Drug Use: No  . Sexual Activity: Yes    Birth Control/ Protection: Surgical   Other Topics Concern  . Not on file   Social History Narrative   Review of Systems  Constitutional:  Positive for fatigue and unexpected weight change (Patient has gained 18 pounds over the past 3 months).  HENT: Negative.   Eyes: Negative.  Negative for photophobia and visual disturbance.  Respiratory: Positive for apnea (observed apnea and snoring).   Cardiovascular: Negative.  Negative for chest pain, palpitations and leg swelling.  Gastrointestinal: Negative.  Negative for diarrhea and constipation.  Endocrine: Positive for polyuria.  Negative for polydipsia and polyphagia.  Genitourinary: Negative.   Musculoskeletal: Positive for myalgias.  Skin: Negative.   Allergic/Immunologic: Negative for immunocompromised state.  Neurological: Negative.  Negative for dizziness.  Hematological: Negative.   Psychiatric/Behavioral: Positive for sleep disturbance, decreased concentration and agitation. Negative for suicidal ideas. The patient is nervous/anxious.        Objective:   Physical Exam  Constitutional: She is oriented to person, place, and time. She appears well-developed and well-nourished.  Morbid obesity  HENT:  Head: Normocephalic and atraumatic.  Right Ear: External ear normal.  Left Ear: External ear normal.  Nose: Nose normal.  Mouth/Throat: Oropharynx is clear and moist.  Eyes: EOM are normal. Pupils are equal, round, and reactive to light.  Neck: Normal range of motion. Neck supple.  Cardiovascular: Normal rate, regular rhythm, normal heart sounds and intact distal pulses.   Pulmonary/Chest: Effort normal and breath sounds normal. No apnea.  Abdominal: Soft. Bowel sounds are normal.  Increased abdominal girth  Musculoskeletal: Normal range of motion.  Neurological: She is alert and oriented to person, place, and time. She has normal reflexes.  Skin: Skin is warm and dry.  Psychiatric: Her behavior is normal. Judgment and thought content normal. Her mood appears anxious. She exhibits a depressed mood.     BP 136/68 mmHg  Pulse 91  Temp(Src) 98.1 F (36.7 C) (Oral)  Resp 16  Ht 5' (1.524 m)  Wt 274 lb (124.286 kg)  BMI 53.51 kg/m2  LMP 12/22/2015 Assessment & Plan:  1. Essential hypertension Blood pressure is at goal on current medication regimen. Will continue medications at current dosage.  - POCT urinalysis dip (device)  2. Controlled type 2 diabetes mellitus without complication, without long-term current use of insulin ([place]) Patient is currently taking Metformin 500 mg BID. Will check a1C  today, it has been greater than 1 year since past a1C. Patient is morbidly obese, I recommend a lowfat, low carbohydrate diet divided over 5-6 small meals, increase water intake to 6-8 glasses, and 150 minutes per week of cardiovascular exercise.   - Hemoglobin A1c - Ambulatory referral to Ophthalmology  3. HIV disease ([place]) Reviewed previous labs, follow up with Dr. Johnnye Sima as scheduled  4. Depression Ms. Shiffer and I discussed depression at length. She has had some tragic events occur in her life that she has not began to deal with. She maintains that losing her mother at age 84 was a devastating loss that lead her down a destructive path. I will start a trial of buspar for depression and anxiety. I will also send a referral to psychiatry for further counseling and medication management.  - busPIRone (BUSPAR) 7.5 MG tablet; Take 1 tablet (7.5 mg total) by mouth 2 (two) times daily.  Dispense: 60 tablet; Refill: 0 - Ambulatory referral to Psychiatry  5. Anxiety state Refer to #4 - busPIRone (BUSPAR) 7.5 MG tablet; Take 1 tablet (7.5 mg total) by mouth 2 (two) times daily.  Dispense: 60 tablet; Refill: 0 - Ambulatory referral to Psychiatry  6. Obesity Refer to #2 - TSH  7. Hyperlipidemia LDL goal <100  The patient is asked to make an attempt to improve diet and exercise patterns to aid in medical management of this problem. - atorvastatin (LIPITOR) 10 MG tablet; Take 1 tablet (10 mg total) by mouth daily.  Dispense: 90 tablet; Refill: 3  8. Pain, dental Ms. Greenstreet states that she has an appointment scheduled with dental services. I recommend that she refrain from Ibuprofen due to potential interaction with anti-viral medication. She expressed understanding.   9. Tobacco dependence Smoking cessation instruction/counseling given:  counseled patient on the dangers of tobacco use, advised patient to stop smoking, and reviewed strategies to maximize success  10. Observed sleep  apnea Will send a referral for a split night sleep study.   11. Immunization due  - Pneumococcal conjugate vaccine 13-valent  Routine Health Maintenance: Last pap smear: greater than 3 years, will schedule soon. Patient's mother passed away with cervical cancer. She and I discussed the importance of routine pap smears     Hollis,Lachina M, FNP  RTC: 1 month for DMII, hypertension, depression, and anxiety    The patient was given clear instructions to go to ER or return to medical center if symptoms do not improve, worsen or new problems develop. The patient verbalized understanding. Will notify patient with laboratory results.

## 2016-01-03 NOTE — Patient Instructions (Signed)

## 2016-01-04 LAB — HEMOGLOBIN A1C
HEMOGLOBIN A1C: 9.9 % — AB (ref <5.7)
Mean Plasma Glucose: 237 mg/dL — ABNORMAL HIGH (ref <117)

## 2016-01-06 ENCOUNTER — Other Ambulatory Visit: Payer: Self-pay | Admitting: Family Medicine

## 2016-01-06 DIAGNOSIS — E119 Type 2 diabetes mellitus without complications: Secondary | ICD-10-CM

## 2016-01-06 MED ORDER — SITAGLIPTIN PHOS-METFORMIN HCL 50-500 MG PO TABS: 1.0000 | ORAL_TABLET | Freq: Two times a day (BID) | ORAL | Status: DC

## 2016-01-06 NOTE — Progress Notes (Signed)
Called and left message for patient to call back regarding lab results. Thanks!

## 2016-01-06 NOTE — Progress Notes (Signed)
Reviewed laboratory results. Hemoglobin a1C increased to 9.9%, which is consistent with uncontrolled diabetes mellitus. Will start a trial of Janumet 50-500 mg BID. Will follow-up in office in 1 month. Recommend a lowfat, low carbohydrate diet divided over 5-6 small meals, increase water intake to 6-8 glasses, and 150 minutes per week of cardiovascular exercise.    Meds ordered this encounter  Medications  . sitaGLIPtin-metformin (JANUMET) 50-500 MG tablet    Sig: Take 1 tablet by mouth 2 (two) times daily with a meal.    Dispense:  60 tablet    Refill:  2     Bassam Dresch M, FNP

## 2016-01-07 NOTE — Progress Notes (Signed)
Called and left message for patient to call back regarding labs and left call back number. Thanks!

## 2016-01-07 NOTE — Progress Notes (Signed)
Patient returned call and I advised of hgba1c and to start taking Janumet twice daily as prescribed. To stick to low fat/ low carb diet, drink 6 to 8 glasses of water daily, eat meals over 5 to 6 small meals daily, and to exercised 150 minutes weekly. Patient verbalized understanding and had no questions at this time. Thanks!

## 2016-01-21 ENCOUNTER — Other Ambulatory Visit: Payer: Self-pay | Admitting: Family Medicine

## 2016-01-21 DIAGNOSIS — F32A Depression, unspecified: Secondary | ICD-10-CM

## 2016-01-21 DIAGNOSIS — F329 Major depressive disorder, single episode, unspecified: Secondary | ICD-10-CM

## 2016-01-30 ENCOUNTER — Other Ambulatory Visit: Payer: Self-pay | Admitting: Family Medicine

## 2016-02-12 ENCOUNTER — Other Ambulatory Visit: Payer: Self-pay | Admitting: Family Medicine

## 2016-02-17 ENCOUNTER — Ambulatory Visit: Payer: Medicaid Other | Admitting: Family Medicine

## 2016-03-04 NOTE — Progress Notes (Signed)
This encounter was created in error - please disregard.

## 2016-03-05 ENCOUNTER — Ambulatory Visit (HOSPITAL_COMMUNITY)
Admission: RE | Admit: 2016-03-05 | Discharge: 2016-03-05 | Disposition: A | Discharge status: Home / Self Care | Payer: Medicaid Other | Source: Ambulatory Visit | Attending: Family Medicine | Admitting: Family Medicine

## 2016-03-05 ENCOUNTER — Encounter: Payer: Self-pay | Admitting: Family Medicine

## 2016-03-05 ENCOUNTER — Ambulatory Visit (INDEPENDENT_AMBULATORY_CARE_PROVIDER_SITE_OTHER): Payer: Medicaid Other | Admitting: Family Medicine

## 2016-03-05 ENCOUNTER — Other Ambulatory Visit: Payer: Self-pay | Admitting: Family Medicine

## 2016-03-05 VITALS — BP 136/78 | HR 100 | Temp 98.4°F | Resp 16 | Ht 60.0 in | Wt 278.0 lb

## 2016-03-05 DIAGNOSIS — E119 Type 2 diabetes mellitus without complications: Secondary | ICD-10-CM

## 2016-03-05 DIAGNOSIS — B20 Human immunodeficiency virus [HIV] disease: Secondary | ICD-10-CM | POA: Diagnosis not present

## 2016-03-05 DIAGNOSIS — A499 Bacterial infection, unspecified: Secondary | ICD-10-CM

## 2016-03-05 DIAGNOSIS — R739 Hyperglycemia, unspecified: Secondary | ICD-10-CM | POA: Diagnosis not present

## 2016-03-05 DIAGNOSIS — I10 Essential (primary) hypertension: Secondary | ICD-10-CM

## 2016-03-05 DIAGNOSIS — N898 Other specified noninflammatory disorders of vagina: Secondary | ICD-10-CM

## 2016-03-05 DIAGNOSIS — F411 Generalized anxiety disorder: Secondary | ICD-10-CM

## 2016-03-05 DIAGNOSIS — B373 Candidiasis of vulva and vagina: Secondary | ICD-10-CM

## 2016-03-05 DIAGNOSIS — N760 Acute vaginitis: Secondary | ICD-10-CM

## 2016-03-05 DIAGNOSIS — B3731 Acute candidiasis of vulva and vagina: Secondary | ICD-10-CM

## 2016-03-05 DIAGNOSIS — L298 Other pruritus: Secondary | ICD-10-CM | POA: Diagnosis not present

## 2016-03-05 DIAGNOSIS — L304 Erythema intertrigo: Secondary | ICD-10-CM

## 2016-03-05 DIAGNOSIS — B9689 Other specified bacterial agents as the cause of diseases classified elsewhere: Secondary | ICD-10-CM

## 2016-03-05 LAB — POCT WET + KOH PREP
Trich by wet prep: ABSENT
Yeast by wet prep: ABSENT

## 2016-03-05 LAB — GLUCOSE, CAPILLARY
GLUCOSE-CAPILLARY: 418 mg/dL — AB (ref 65–99)
GLUCOSE-CAPILLARY: 255 mg/dL — AB (ref 65–99)
Glucose-Capillary: 401 mg/dL — ABNORMAL HIGH (ref 65–99)

## 2016-03-05 LAB — BASIC METABOLIC PANEL
Anion gap: 10 (ref 5–15)
BUN: 17 mg/dL (ref 6–20)
CHLORIDE: 100 mmol/L — AB (ref 101–111)
CO2: 24 mmol/L (ref 22–32)
CREATININE: 1.23 mg/dL — AB (ref 0.44–1.00)
Calcium: 9 mg/dL (ref 8.9–10.3)
GFR calc Af Amer: >60 mL/min (ref >60)
GFR calc non Af Amer: 56 mL/min — ABNORMAL LOW (ref >60)
Glucose, Bld: 360 mg/dL — ABNORMAL HIGH (ref 65–99)
POTASSIUM: 3.9 mmol/L (ref 3.5–5.1)
Sodium: 134 mmol/L — ABNORMAL LOW (ref 135–145)

## 2016-03-05 LAB — POCT URINALYSIS DIP (DEVICE)
BILIRUBIN URINE: NEGATIVE
Glucose, UA: 500 mg/dL — AB
Ketones, ur: NEGATIVE mg/dL
LEUKOCYTES UA: NEGATIVE
NITRITE: NEGATIVE
Protein, ur: NEGATIVE mg/dL
Specific Gravity, Urine: 1.01 (ref 1.005–1.030)
UROBILINOGEN UA: 0.2 mg/dL (ref 0.0–1.0)
pH: 6 (ref 5.0–8.0)

## 2016-03-05 MED ORDER — INSULIN REGULAR HUMAN 100 UNIT/ML IJ SOLN
10.0000 [IU] | Freq: Once | INTRAMUSCULAR | Status: AC
  Administered 2016-03-05: 10 [IU] via SUBCUTANEOUS

## 2016-03-05 MED ORDER — MICONAZOLE NITRATE 2 % EX POWD: CUTANEOUS | Status: DC | PRN

## 2016-03-05 MED ORDER — SITAGLIPTIN PHOS-METFORMIN HCL 50-500 MG PO TABS: 1.0000 | ORAL_TABLET | Freq: Two times a day (BID) | ORAL | Status: DC

## 2016-03-05 MED ORDER — FLUCONAZOLE 150 MG PO TABS: 150.0000 mg | ORAL_TABLET | Freq: Once | ORAL | Status: DC

## 2016-03-05 MED ORDER — METRONIDAZOLE 500 MG PO TABS: 500.0000 mg | ORAL_TABLET | Freq: Two times a day (BID) | ORAL | Status: DC

## 2016-03-05 MED ORDER — SODIUM CHLORIDE 0.9 % IV SOLN
INTRAVENOUS | Status: DC
  Administered 2016-03-05: 12:00:00 via INTRAVENOUS

## 2016-03-05 MED ORDER — BUSPIRONE HCL 15 MG PO TABS: 15.0000 mg | ORAL_TABLET | Freq: Two times a day (BID) | ORAL | Status: DC

## 2016-03-05 MED ORDER — LISINOPRIL-HYDROCHLOROTHIAZIDE 20-25 MG PO TABS: 1.0000 | ORAL_TABLET | Freq: Every day | ORAL | Status: DC

## 2016-03-05 NOTE — Progress Notes (Signed)
Diagnosis Association: Hyperglycemia (790.29  Provider: C. Hart RochesterHollis, NP  Procedure: Pt had labs obtained for bmet and received 1 liter of normal saline fluids.  Pt tolerated procedure well.  Post procedure: Pt alert, oriented and ambulatory at discharge.

## 2016-03-05 NOTE — Progress Notes (Signed)
Subjective:    Patient ID: Sherry Hall, female    DOB: 09/22/1981, 35 y.o.   MRN: [id number]  HPI [name], a 35 year old female with a history of DMII, hypertension, HIV, and depression presents for a follow-up of chronic conditions.  Sherry Hall has  type 2 diabetes mellitus. She states that she was diagnosed at age 85. She is currently on Janumet BID. She says that she has not been taking medication or check blood sugars consistently.  She has gained a total of 18 pounds since November and is morbidly obese. She states that she has been eating fast food and eating 2 large meals per day. She reports fatigue and polyuria.  Patient denies foot ulcerations, hyperglycemia, paresthesia of the feet, polydipsia, visual disturbances, vomitting and weight loss.  Previous hemoglobin a1C was greater than 1 year ago and patient does not check blood sugars.   She also has a history of hypertension.  She is not exercising and is not adherent to low salt diet.  Patient is not currently checking blood sugars at home.  Patient denies chest pain, dyspnea, irregular heart beat, syncope and tachypnea.  Cardiovascular risk factors include: she is an everyday tobacco smoker, morbid obesity, and sedentary lifestyle.   Sherry Hall complains of depression and anxiety. She states that she has been experiencing symptoms for many years. She has a psychiatry evaluation scheduled for July 2017.  She has also met with a counselor at [facility] without success. She maintains that she has been on medications in the past, but has never completed counseling.  She has the following symptoms: anhedonia,  difficulty concentrating, feelings of losing control, insomnia, irritable, racing thoughts. Onset of symptoms was many years ago after losing her mother at age 48 from cervical cancer. Symptoms occur intermittently. She denies current suicidal and homicidal ideation.   Past Medical History  Diagnosis  Date  . Diabetes mellitus type 1 ([place])   . Hypertension   . HIV (human immunodeficiency virus infection) ([place]) 2011  . Depression    Immunization History  Administered Date(s) Administered  . Hepatitis A, Adult 01/28/2010, 03/11/2010  . Hepatitis B 01/28/2010, 03/11/2010  . Influenza,inj,Quad PF,36+ Mos 09/16/2015  . PPD Test 01/11/2013  . Pneumococcal Conjugate-13 01/03/2016  . Pneumococcal Polysaccharide-23 01/28/2010, 10/20/2012  . Tdap 01/11/2013   Social History   Social History  . Marital Status: Single    Spouse Name: N/A  . Number of Children: N/A  . Years of Education: N/A   Occupational History  . Not on file.   Social History Main Topics  . Smoking status: Current Every Day Smoker -- 0.25 packs/day    Types: Cigarettes  . Smokeless tobacco: Never Used     Comment: trying to slow  . Alcohol Use: 0.6 oz/week    1 Standard drinks or equivalent per week     Comment: socially  . Drug Use: No  . Sexual Activity: Yes    Birth Control/ Protection: Surgical   Other Topics Concern  . Not on file   Social History Narrative   Review of Systems  Constitutional: Positive for fatigue and unexpected weight change (Patient has gained 18 pounds over the past 3 months).  HENT: Negative.   Eyes: Negative.  Negative for photophobia and visual disturbance.  Cardiovascular: Negative.  Negative for chest pain, palpitations and leg swelling.  Gastrointestinal: Negative.  Negative for diarrhea and constipation.  Endocrine: Positive for polyuria. Negative for polydipsia and polyphagia.  Genitourinary:  Positive for vaginal discharge (vaginal itching).  Musculoskeletal: Positive for myalgias.  Skin: Negative.   Allergic/Immunologic: Negative for immunocompromised state.  Neurological: Negative.  Negative for dizziness.  Hematological: Negative.   Psychiatric/Behavioral: Positive for depression, sleep disturbance and decreased concentration. Negative for suicidal ideas. The  patient is nervous/anxious.        Objective:   Physical Exam  Constitutional: She is oriented to person, place, and time. She appears well-developed and well-nourished.  Morbid obesity  HENT:  Head: Normocephalic and atraumatic.  Right Ear: External ear normal.  Left Ear: External ear normal.  Nose: Nose normal.  Mouth/Throat: Oropharynx is clear and moist.  Eyes: EOM are normal. Pupils are equal, round, and reactive to light.  Neck: Normal range of motion. Neck supple.  Cardiovascular: Normal rate, regular rhythm, normal heart sounds and intact distal pulses.   Pulmonary/Chest: Effort normal and breath sounds normal. No apnea.  Abdominal: Soft. Bowel sounds are normal.  Increased abdominal girth  Musculoskeletal: Normal range of motion.  Neurological: She is alert and oriented to person, place, and time. She has normal reflexes.  Skin: Skin is warm and dry.  Psychiatric: Her behavior is normal. Judgment and thought content normal. Her mood appears anxious. She exhibits a depressed mood.     BP 136/78 mmHg  Pulse 100  Temp(Src) 98.4 F (36.9 C) (Oral)  Resp 16  Ht 5' (1.524 Hall)  Wt 278 lb (126.1 kg)  BMI 54.29 kg/m2  SpO2 100%  LMP 02/25/2016 Assessment & Plan:  1. Essential hypertension Blood pressure is at goal on current bp medication, will continue medication at current dosage.  - lisinopril-hydrochlorothiazide (PRINZIDE,ZESTORETIC) 20-25 MG tablet; Take 1 tablet by mouth daily.  Dispense: 30 tablet; Refill: 5  2. Controlled type 2 diabetes mellitus without complication, without long-term current use of insulin ([place]) Patient reports that she as not been taking medication consistently. There have been some probable non compliance issues here. I have discussed with her the great importance of following the treatment plan exactly as directed in order to achieve a good medical outcome. - Glucose (CBG) - Urinalysis Dipstick - sitaGLIPtin-metformin (JANUMET) 50-500 MG  tablet; Take 1 tablet by mouth 2 (two) times daily with a meal.  Dispense: 60 tablet; Refill: 2 - Hemoglobin A1c; Future - COMPLETE METABOLIC PANEL WITH GFR; Future  3. HIV disease ([place]) Sherry Hall is to follow up with   4. Vaginal itching  - POCT Wet + KOH Prep  5. Vaginal discharge - POCT Wet + KOH Prep  6. Generalized anxiety disorder - busPIRone (BUSPAR) 15 MG tablet; Take 1 tablet (15 mg total) by mouth 2 (two) times daily.  Dispense: 60 tablet; Refill: 5  7. Intertrigo - miconazole (MICRO GUARD) 2 % powder; Apply topically as needed for itching.  Dispense: 70 g; Refill: 0  8. Hyperglycemia CBG is 418, will give a fluid bolus prior to discharge.  - insulin regular (NOVOLIN R,HUMULIN R) 100 units/mL injection 10 Units; Inject 0.1 mLs (10 Units total) into the skin once.  9. Vaginal yeast infection - fluconazole (DIFLUCAN) 150 MG tablet; Take 1 tablet (150 mg total) by mouth once.  Dispense: 2 tablet; Refill: 0  10. Bacterial vaginosis - metroNIDAZOLE (FLAGYL) 500 MG tablet; Take 1 tablet (500 mg total) by mouth 2 (two) times daily.  Dispense: 14 tablet; Refill: 0  Routine Health Maintenance: Last pap smear: greater than 3 years, will schedule soon. Patient's mother passed away with cervical cancer. She and I discussed the  importance of routine pap smears     Sherry Wiegand M, FNP  RTC: 1 month for labs. 2 months for diabetes II, hypertension, depression, and anxiety    The patient was given clear instructions to go to ER or return to medical center if symptoms do not improve, worsen or new problems develop. The patient verbalized understanding. Will notify patient with laboratory results.

## 2016-03-05 NOTE — Patient Instructions (Addendum)
Bacterial Vaginosis Bacterial vaginosis is a vaginal infection that occurs when the normal balance of bacteria in the vagina is disrupted. It results from an overgrowth of certain bacteria. This is the most common vaginal infection in women of childbearing age. Treatment is important to prevent complications, especially in pregnant women, as it can cause a premature delivery. CAUSES  Bacterial vaginosis is caused by an increase in harmful bacteria that are normally present in smaller amounts in the vagina. Several different kinds of bacteria can cause bacterial vaginosis. However, the reason that the condition develops is not fully understood. RISK FACTORS Certain activities or behaviors can put you at an increased risk of developing bacterial vaginosis, including:  Having a new sex partner or multiple sex partners.  Douching.  Using an intrauterine device (IUD) for contraception. Women do not get bacterial vaginosis from toilet seats, bedding, swimming pools, or contact with objects around them. SIGNS AND SYMPTOMS  Some women with bacterial vaginosis have no signs or symptoms. Common symptoms include:  Grey vaginal discharge.  A fishlike odor with discharge, especially after sexual intercourse.  Itching or burning of the vagina and vulva.  Burning or pain with urination. DIAGNOSIS  Your health care provider will take a medical history and examine the vagina for signs of bacterial vaginosis. A sample of vaginal fluid may be taken. Your health care provider will look at this sample under a microscope to check for bacteria and abnormal cells. A vaginal pH test may also be done.  TREATMENT  Bacterial vaginosis may be treated with antibiotic medicines. These may be given in the form of a pill or a vaginal cream. A second round of antibiotics may be prescribed if the condition comes back after treatment. Because bacterial vaginosis increases your risk for sexually transmitted diseases, getting  treated can help reduce your risk for chlamydia, gonorrhea, HIV, and herpes. HOME CARE INSTRUCTIONS   Only take over-the-counter or prescription medicines as directed by your health care provider.  If antibiotic medicine was prescribed, take it as directed. Make sure you finish it even if you start to feel better.  Tell all sexual partners that you have a vaginal infection. They should see their health care provider and be treated if they have problems, such as a mild rash or itching.  During treatment, it is important that you follow these instructions:  Avoid sexual activity or use condoms correctly.  Do not douche.  Avoid alcohol as directed by your health care provider.  Avoid breastfeeding as directed by your health care provider. SEEK MEDICAL CARE IF:   Your symptoms are not improving after 3 days of treatment.  You have increased discharge or pain.  You have a fever. MAKE SURE YOU:   Understand these instructions.  Will watch your condition.  Will get help right away if you are not doing well or get worse. FOR MORE INFORMATION  Centers for Disease Control and Prevention, Division of STD Prevention: [employer] American Sexual Health Association (ASHA): www.ashastd.org    This information is not intended to replace advice given to you by your health care provider. Make sure you discuss any questions you have with your health care provider.   Document Released: 10/05/2005 Document Revised: 10/26/2014 Document Reviewed: 05/17/2013 Elsevier Interactive Patient Education 2016 Else[employer] Inc[employer] Hyperglycemia Hyperglycemia occurs when the glucose (sugar) in your blood is too high. Hyperglycemia can happen for many reasons, but it most often happens to people who do not know they have diabetes or are not managing  their diabetes properly.  CAUSES  Whether you have diabetes or not, there are other causes of hyperglycemia. Hyperglycemia can occur when you have diabetes, but it  can also occur in other situations that you might not be as aware of, such as: Diabetes  If you have diabetes and are having problems controlling your blood glucose, hyperglycemia could occur because of some of the following reasons:  Not following your meal plan.  Not taking your diabetes medications or not taking it properly.  Exercising less or doing less activity than you normally do.  Being sick. Pre-diabetes  This cannot be ignored. Before people develop Type 2 diabetes, they almost always have "pre-diabetes." This is when your blood glucose levels are higher than normal, but not yet high enough to be diagnosed as diabetes. Research has shown that some long-term damage to the body, especially the heart and circulatory system, may already be occurring during pre-diabetes. If you take action to manage your blood glucose when you have pre-diabetes, you may delay or prevent Type 2 diabetes from developing. Stress  If you have diabetes, you may be "diet" controlled or on oral medications or insulin to control your diabetes. However, you may find that your blood glucose is higher than usual in the hospital whether you have diabetes or not. This is often referred to as "stress hyperglycemia." Stress can elevate your blood glucose. This happens because of hormones put out by the body during times of stress. If stress has been the cause of your high blood glucose, it can be followed regularly by your caregiver. That way he/she can make sure your hyperglycemia does not continue to get worse or progress to diabetes. Steroids  Steroids are medications that act on the infection fighting system (immune system) to block inflammation or infection. One side effect can be a rise in blood glucose. Most people can produce enough extra insulin to allow for this rise, but for those who cannot, steroids make blood glucose levels go even higher. It is not unusual for steroid treatments to "uncover" diabetes that  is developing. It is not always possible to determine if the hyperglycemia will go away after the steroids are stopped. A special blood test called an A1c is sometimes done to determine if your blood glucose was elevated before the steroids were started. SYMPTOMS  Thirsty.  Frequent urination.  Dry mouth.  Blurred vision.  Tired or fatigue.  Weakness.  Sleepy.  Tingling in feet or leg. DIAGNOSIS  Diagnosis is made by monitoring blood glucose in one or all of the following ways:  A1c test. This is a chemical found in your blood.  Fingerstick blood glucose monitoring.  Laboratory results. TREATMENT  First, knowing the cause of the hyperglycemia is important before the hyperglycemia can be treated. Treatment may include, but is not be limited to:  Education.  Change or adjustment in medications.  Change or adjustment in meal plan.  Treatment for an illness, infection, etc.  More frequent blood glucose monitoring.  Change in exercise plan.  Decreasing or stopping steroids.  Lifestyle changes. HOME CARE INSTRUCTIONS   Test your blood glucose as directed.  Exercise regularly. Your caregiver will give you instructions about exercise. Pre-diabetes or diabetes which comes on with stress is helped by exercising.  Eat wholesome, balanced meals. Eat often and at regular, fixed times. Your caregiver or nutritionist will give you a meal plan to guide your sugar intake.  Being at an ideal weight is important. If needed, losing as  little as 10 to 15 pounds may help improve blood glucose levels. SEEK MEDICAL CARE IF:   You have questions about medicine, activity, or diet.  You continue to have symptoms (problems such as increased thirst, urination, or weight gain). SEEK IMMEDIATE MEDICAL CARE IF:   You are vomiting or have diarrhea.  Your breath smells fruity.  You are breathing faster or slower.  You are very sleepy or incoherent.  You have numbness, tingling, or  pain in your feet or hands.  You have chest pain.  Your symptoms get worse even though you have been following your caregiver's orders.  If you have any other questions or concerns.   This information is not intended to replace advice given to you by your health care provider. Make sure you discuss any questions you have with your health care provider.   Document Released: 03/31/2001 Document Revised: 12/28/2011 Document Reviewed: 06/11/2015 Elsevier Interactive Patient Education 2016 [employer]. Diabetes and Exercise Exercising regularly is important. It is not just about losing weight. It has many health benefits, such as:  Improving your overall fitness, flexibility, and endurance.  Increasing your bone density.  Helping with weight control.  Decreasing your body fat.  Increasing your muscle strength.  Reducing stress and tension.  Improving your overall health. People with diabetes who exercise gain additional benefits because exercise:  Reduces appetite.  Improves the body's use of blood sugar (glucose).  Helps lower or control blood glucose.  Decreases blood pressure.  Helps control blood lipids (such as cholesterol and triglycerides).  Improves the body's use of the hormone insulin by:  Increasing the body's insulin sensitivity.  Reducing the body's insulin needs.  Decreases the risk for heart disease because exercising:  Lowers cholesterol and triglycerides levels.  Increases the levels of good cholesterol (such as high-density lipoproteins [HDL]) in the body.  Lowers blood glucose levels. YOUR ACTIVITY PLAN  Choose an activity that you enjoy, and set realistic goals. To exercise safely, you should begin practicing any new physical activity slowly, and gradually increase the intensity of the exercise over time. Your health care provider or diabetes educator can help create an activity plan that works for you. General recommendations  include:  Encouraging children to engage in at least 60 minutes of physical activity each day.  Stretching and performing strength training exercises, such as yoga or weight lifting, at least 2 times per week.  Performing a total of at least 150 minutes of moderate-intensity exercise each week, such as brisk walking or water aerobics.  Exercising at least 3 days per week, making sure you allow no more than 2 consecutive days to pass without exercising.  Avoiding long periods of inactivity (90 minutes or more). When you have to spend an extended period of time sitting down, take frequent breaks to walk or stretch. RECOMMENDATIONS FOR EXERCISING WITH TYPE 1 OR TYPE 2 DIABETES   Check your blood glucose before exercising. If blood glucose levels are greater than 240 mg/dL, check for urine ketones. Do not exercise if ketones are present.  Avoid injecting insulin into areas of the body that are going to be exercised. For example, avoid injecting insulin into:  The arms when playing tennis.  The legs when jogging.  Keep a record of:  Food intake before and after you exercise.  Expected peak times of insulin action.  Blood glucose levels before and after you exercise.  The type and amount of exercise you have done.  Review your records with your  health care provider. Your health care provider will help you to develop guidelines for adjusting food intake and insulin amounts before and after exercising.  If you take insulin or oral hypoglycemic agents, watch for signs and symptoms of hypoglycemia. They include:  Dizziness.  Shaking.  Sweating.  Chills.  Confusion.  Drink plenty of water while you exercise to prevent dehydration or heat stroke. Body water is lost during exercise and must be replaced.  Talk to your health care provider before starting an exercise program to make sure it is safe for you. Remember, almost any type of activity is better than none.   This  information is not intended to replace advice given to you by your health care provider. Make sure you discuss any questions you have with your health care provider.   Document Released: 12/26/2003 Document Revised: 02/19/2015 Document Reviewed: 03/14/2013 Elsevier Interactive Patient Education 2016 Elsevier Inc. Diabetes and Foot Care Diabetes may cause you to have problems because of poor blood supply (circulation) to your feet and legs. This may cause the skin on your feet to become thinner, break easier, and heal more slowly. Your skin may become dry, and the skin may peel and crack. You may also have nerve damage in your legs and feet causing decreased feeling in them. You may not notice minor injuries to your feet that could lead to infections or more serious problems. Taking care of your feet is one of the most important things you can do for yourself.  HOME CARE INSTRUCTIONS  Wear shoes at all times, even in the house. Do not go barefoot. Bare feet are easily injured.  Check your feet daily for blisters, cuts, and redness. If you cannot see the bottom of your feet, use a mirror or ask someone for help.  Wash your feet with warm water (do not use hot water) and mild soap. Then pat your feet and the areas between your toes until they are completely dry. Do not soak your feet as this can dry your skin.  Apply a moisturizing lotion or petroleum jelly (that does not contain alcohol and is unscented) to the skin on your feet and to dry, brittle toenails. Do not apply lotion between your toes.  Trim your toenails straight across. Do not dig under them or around the cuticle. File the edges of your nails with an emery board or nail file.  Do not cut corns or calluses or try to remove them with medicine.  Wear clean socks or stockings every day. Make sure they are not too tight. Do not wear knee-high stockings since they may decrease blood flow to your legs.  Wear shoes that fit properly and  have enough cushioning. To break in new shoes, wear them for just a few hours a day. This prevents you from injuring your feet. Always look in your shoes before you put them on to be sure there are no objects inside.  Do not cross your legs. This may decrease the blood flow to your feet.  If you find a minor scrape, cut, or break in the skin on your feet, keep it and the skin around it clean and dry. These areas may be cleansed with mild soap and water. Do not cleanse the area with peroxide, alcohol, or iodine.  When you remove an adhesive bandage, be sure not to damage the skin around it.  If you have a wound, look at it several times a day to make sure it is healing.  Do not use heating pads or hot water bottles. They may burn your skin. If you have lost feeling in your feet or legs, you may not know it is happening until it is too late.  Make sure your health care provider performs a complete foot exam at least annually or more often if you have foot problems. Report any cuts, sores, or bruises to your health care provider immediately. SEEK MEDICAL CARE IF:   You have an injury that is not healing.  You have cuts or breaks in the skin.  You have an ingrown nail.  You notice redness on your legs or feet.  You feel burning or tingling in your legs or feet.  You have pain or cramps in your legs and feet.  Your legs or feet are numb.  Your feet always feel cold. SEEK IMMEDIATE MEDICAL CARE IF:   There is increasing redness, swelling, or pain in or around a wound.  There is a red line that goes up your leg.  Pus is coming from a wound.  You develop a fever or as directed by your health care provider.  You notice a bad smell coming from an ulcer or wound.   This information is not intended to replace advice given to you by your health care provider. Make sure you discuss any questions you have with your health care provider.   Document Released: 10/02/2000 Document Revised:  06/07/2013 Document Reviewed: 03/14/2013 Elsevier Interactive Patient Education [employer]

## 2016-03-05 NOTE — Discharge Instructions (Signed)
Pt received 1 liter of normal saline and had CBG performed afterwards.

## 2016-03-13 ENCOUNTER — Ambulatory Visit (HOSPITAL_BASED_OUTPATIENT_CLINIC_OR_DEPARTMENT_OTHER): Payer: Medicaid Other | Attending: Family Medicine | Admitting: Internal Medicine

## 2016-03-13 VITALS — Ht 60.0 in | Wt 278.0 lb

## 2016-03-13 DIAGNOSIS — G4733 Obstructive sleep apnea (adult) (pediatric): Secondary | ICD-10-CM | POA: Diagnosis not present

## 2016-03-13 DIAGNOSIS — R0683 Snoring: Secondary | ICD-10-CM | POA: Insufficient documentation

## 2016-03-17 ENCOUNTER — Other Ambulatory Visit (HOSPITAL_BASED_OUTPATIENT_CLINIC_OR_DEPARTMENT_OTHER): Payer: Self-pay

## 2016-03-17 DIAGNOSIS — G4733 Obstructive sleep apnea (adult) (pediatric): Secondary | ICD-10-CM

## 2016-03-22 DIAGNOSIS — G4733 Obstructive sleep apnea (adult) (pediatric): Secondary | ICD-10-CM | POA: Diagnosis not present

## 2016-03-22 NOTE — Procedures (Signed)
   Patient Name: Sherry Hall, Sherry Hall Study Date: 03/13/2016 Gender: Female D.O.B: 1981/07/07 Age (years): 34 Referring Provider: Julianne HandlerLachina Hollis Height (inches): 60 Interpreting Physician: Jetty Duhamellinton Young MD, ABSM Weight (lbs): 278 RPSGT: Rolene ArbourMcConnico, Yvonne BMI: 54 MRN: [id number] Neck Size: 16.00 CLINICAL INFORMATION Sleep Study Type: NPSG Indication for sleep study: OSA Epworth Sleepiness Score: 8  SLEEP STUDY TECHNIQUE As per the AASM Manual for the Scoring of Sleep and Associated Events v2.3 (April 2016) with a hypopnea requiring 4% desaturations. The channels recorded and monitored were frontal, central and occipital EEG, electrooculogram (EOG), submentalis EMG (chin), nasal and oral airflow, thoracic and abdominal wall motion, anterior tibialis EMG, snore microphone, electrocardiogram, and pulse oximetry.  MEDICATIONS Patient's medications include: charted for review. Medications self-administered by patient during sleep study : No sleep medicine administered.  SLEEP ARCHITECTURE The study was initiated at 10:50:42 PM and ended at 4:58:13 AM. Sleep onset time was 3.3 minutes and the sleep efficiency was 95.4%. The total sleep time was 350.7 minutes. Stage REM latency was 177.0 minutes. The patient spent 1.71% of the night in stage N1 sleep, 83.18% in stage N2 sleep, 0.00% in stage N3 and 15.11% in REM. Alpha intrusion was absent. Supine sleep was 80.63%.  RESPIRATORY PARAMETERS The overall apnea/hypopnea index (AHI) was 1.4 per hour. There were 0 total apneas, including 0 obstructive, 0 central and 0 mixed apneas. There were 8 hypopneas and 7 RERAs. The AHI during Stage REM sleep was 9.1 per hour. AHI while supine was 1.7 per hour. The mean oxygen saturation was 97.67%. The minimum SpO2 during sleep was 87.00%. Loud snoring was noted during this study.  CARDIAC DATA The 2 lead EKG demonstrated sinus rhythm. The mean heart rate was 84.62 beats per minute. Other EKG  findings include: None.  LEG MOVEMENT DATA The total PLMS were 0 with a resulting PLMS index of 0.00. Associated arousal with leg movement index was 0.0 .  IMPRESSIONS - No significant obstructive sleep apnea occurred during this study (AHI = 1.4/h). - No significant central sleep apnea occurred during this study (CAI = 0.0/h). - Mild oxygen desaturation was noted during this study (Min O2 = 87.00%). - The patient snored with Loud snoring volume. - No cardiac abnormalities were noted during this study. - Clinically significant periodic limb movements did not occur during sleep. No significant associated arousals.  DIAGNOSIS - Primary Snoring (786.09 [R06.83 ICD-10]) - Normal study  RECOMMENDATIONS - Avoid alcohol, sedatives and other CNS depressants that may worsen sleep apnea and disrupt normal sleep architecture. - Sleep hygiene should be reviewed to assess factors that may improve sleep quality. - Weight management and regular exercise should be initiated or continued if appropriate.  Waymon BudgeYOUNG,CLINTON D Diplomate, American Board of Sleep Medicine  ELECTRONICALLY SIGNED ON:  03/22/2016, 9:28 AM [facility] PH: [phone]   FX: [phone] ACCREDITED BY THE AMERICAN ACADEMY OF SLEEP MEDICINE

## 2016-04-01 ENCOUNTER — Other Ambulatory Visit (HOSPITAL_COMMUNITY)
Admission: RE | Admit: 2016-04-01 | Discharge: 2016-04-01 | Disposition: A | Discharge status: Home / Self Care | Payer: Medicaid Other | Source: Ambulatory Visit | Attending: Infectious Diseases | Admitting: Infectious Diseases

## 2016-04-01 ENCOUNTER — Other Ambulatory Visit: Payer: Medicaid Other

## 2016-04-01 DIAGNOSIS — Z113 Encounter for screening for infections with a predominantly sexual mode of transmission: Secondary | ICD-10-CM

## 2016-04-01 DIAGNOSIS — B20 Human immunodeficiency virus [HIV] disease: Secondary | ICD-10-CM

## 2016-04-02 LAB — URINE CYTOLOGY ANCILLARY ONLY
Chlamydia: NEGATIVE
Neisseria Gonorrhea: NEGATIVE

## 2016-04-02 LAB — T-HELPER CELL (CD4) - (RCID CLINIC ONLY)
CD4 T CELL ABS: 420 /uL (ref 400–2700)
CD4 T CELL HELPER: 26 % — AB (ref 33–55)

## 2016-04-02 LAB — HIV-1 RNA QUANT-NO REFLEX-BLD
HIV 1 RNA QUANT: 261 {copies}/mL — AB (ref <20)
HIV-1 RNA Quant, Log: 2.42 Log copies/mL — ABNORMAL HIGH (ref <1.30)

## 2016-04-03 ENCOUNTER — Other Ambulatory Visit: Payer: Medicaid Other

## 2016-04-06 ENCOUNTER — Other Ambulatory Visit: Payer: Medicaid Other

## 2016-04-06 LAB — HM DIABETES EYE EXAM

## 2016-04-15 ENCOUNTER — Ambulatory Visit: Payer: Medicaid Other | Admitting: Infectious Diseases

## 2016-05-04 ENCOUNTER — Ambulatory Visit: Payer: Medicaid Other | Admitting: Infectious Diseases

## 2016-05-06 ENCOUNTER — Ambulatory Visit: Payer: Medicaid Other | Admitting: Family Medicine

## 2016-05-15 ENCOUNTER — Other Ambulatory Visit: Payer: Medicaid Other

## 2016-06-05 ENCOUNTER — Encounter: Payer: Self-pay | Admitting: Family Medicine

## 2016-06-05 ENCOUNTER — Ambulatory Visit (INDEPENDENT_AMBULATORY_CARE_PROVIDER_SITE_OTHER): Payer: Medicaid Other | Admitting: Family Medicine

## 2016-06-05 VITALS — BP 139/64 | HR 98 | Temp 98.4°F | Resp 18 | Ht 60.0 in | Wt 281.0 lb

## 2016-06-05 DIAGNOSIS — E138 Other specified diabetes mellitus with unspecified complications: Secondary | ICD-10-CM

## 2016-06-05 LAB — CBC WITH DIFFERENTIAL/PLATELET
BASOS ABS: 54 {cells}/uL (ref 0–200)
Basophils Relative: 1 %
EOS ABS: 162 {cells}/uL (ref 15–500)
Eosinophils Relative: 3 %
HCT: 39 % (ref 35.0–45.0)
HEMOGLOBIN: 13 g/dL (ref 11.7–15.5)
LYMPHS ABS: 1836 {cells}/uL (ref 850–3900)
Lymphocytes Relative: 34 %
MCH: 30.4 pg (ref 27.0–33.0)
MCHC: 33.3 g/dL (ref 32.0–36.0)
MCV: 91.1 fL (ref 80.0–100.0)
MONOS PCT: 4 %
MPV: 11.5 fL (ref 7.5–12.5)
Monocytes Absolute: 216 cells/uL (ref 200–950)
NEUTROS ABS: 3132 {cells}/uL (ref 1500–7800)
NEUTROS PCT: 58 %
Platelets: 198 10*3/uL (ref 140–400)
RBC: 4.28 MIL/uL (ref 3.80–5.10)
RDW: 13.6 % (ref 11.0–15.0)
WBC: 5.4 10*3/uL (ref 3.8–10.8)

## 2016-06-05 LAB — COMPLETE METABOLIC PANEL WITH GFR
ALT: 13 U/L (ref 6–29)
AST: 10 U/L (ref 10–30)
Albumin: 3.4 g/dL — ABNORMAL LOW (ref 3.6–5.1)
Alkaline Phosphatase: 75 U/L (ref 33–115)
BILIRUBIN TOTAL: 0.3 mg/dL (ref 0.2–1.2)
BUN: 9 mg/dL (ref 7–25)
CHLORIDE: 104 mmol/L (ref 98–110)
CO2: 22 mmol/L (ref 20–31)
Calcium: 8.3 mg/dL — ABNORMAL LOW (ref 8.6–10.2)
Creat: 0.76 mg/dL (ref 0.50–1.10)
GFR, Est Non African American: >89 mL/min (ref >=60)
GLUCOSE: 285 mg/dL — AB (ref 65–99)
Potassium: 4.1 mmol/L (ref 3.5–5.3)
SODIUM: 134 mmol/L — AB (ref 135–146)
TOTAL PROTEIN: 7.4 g/dL (ref 6.1–8.1)

## 2016-06-05 LAB — GLUCOSE, CAPILLARY: Glucose-Capillary: 315 mg/dL — ABNORMAL HIGH (ref 65–99)

## 2016-06-05 LAB — POCT GLYCOSYLATED HEMOGLOBIN (HGB A1C): Hemoglobin A1C: 10.3

## 2016-06-05 MED ORDER — SITAGLIPTIN PHOS-METFORMIN HCL 50-1000 MG PO TABS: 1.0000 | ORAL_TABLET | Freq: Two times a day (BID) | ORAL | 5 refills | Status: DC

## 2016-06-05 MED ORDER — INSULIN ASPART 100 UNIT/ML ~~LOC~~ SOLN
10.0000 [IU] | Freq: Once | SUBCUTANEOUS | Status: AC
  Administered 2016-06-05: 10 [IU] via SUBCUTANEOUS

## 2016-06-05 MED ORDER — INSULIN GLARGINE 100 UNIT/ML SOLOSTAR PEN: 10.0000 [IU] | PEN_INJECTOR | Freq: Every day | SUBCUTANEOUS | 99 refills | Status: DC

## 2016-06-05 NOTE — Progress Notes (Signed)
Patient is here for 2 month FU  Patient complains of HA beginning today. Patient states the HA is located in the front and is a throbbing and aching pain. Pain is scaled at a 5.  Patient has taken medication today. Patient has not eaten.  Patient tolerated injection well today.

## 2016-06-06 LAB — MICROALBUMIN, URINE: MICROALB UR: 17.5 mg/dL

## 2016-06-08 NOTE — Patient Instructions (Signed)
        Take 10 units of insulin daily at HS.

## 2016-06-08 NOTE — Progress Notes (Signed)
Sherry Hall, is a 35 y.o. female  [id number]:[id number]  GMW:[id number]  DOB - 07/28/1981  CC:  Chief Complaint  Patient presents with  . Follow-up       HPI: Sherry Hall is a 35 y.o. female here for follow-up chronic conditions. She has a history of diabetes, Hypertension, depression and HIV. [facility] She is currently on Janumet, Lipitor, lisinopril/hct and Buspar per Mrs. Hollis. Her las A1C was 9.9 in March. Is 10.3 today. She reports taking her medications regularly and trying to follow a diabetic diet. She does not get regular exercise.   Health Maintenance: She reports having a PAP 2 years ago. She reports it was normal but unsure if HPV was done. She is in need of urine micral. Diabetic eye exam, foot exam  Allergies  Allergen Reactions  . Latex Rash  . Percocet [Oxycodone-Acetaminophen] Hives, Itching and Rash   Past Medical History:  Diagnosis Date  . Depression   . Diabetes mellitus type 1 (HCC)   . HIV (human immunodeficiency virus infection) (HCC) 2011  . Hypertension    Current Outpatient Prescriptions on File Prior to Visit  Medication Sig Dispense Refill  . atorvastatin (LIPITOR) 10 MG tablet Take 1 tablet (10 mg total) by mouth daily. 90 tablet 3  . busPIRone (BUSPAR) 15 MG tablet Take 1 tablet (15 mg total) by mouth 2 (two) times daily. 60 tablet 5  . emtricitabine-rilpivir-tenofovir AF (ODEFSEY) 200-25-25 MG TABS tablet Take 1 tablet by mouth daily with breakfast. 90 tablet 3  . ibuprofen (ADVIL,MOTRIN) 200 MG tablet Take 200 mg by mouth every 6 (six) hours as needed.    Marland Kitchen. lisinopril-hydrochlorothiazide (PRINZIDE,ZESTORETIC) 20-25 MG tablet Take 1 tablet by mouth daily. 30 tablet 5  . miconazole (MICRO GUARD) 2 % powder Apply topically as needed for itching. 70 g 0  . ondansetron (ZOFRAN) 4 MG tablet Take 4 mg by mouth every 8 (eight) hours as needed for nausea or vomiting.    . ranitidine (ZANTAC) 300 MG tablet Take 300 mg  by mouth at bedtime.     No current facility-administered medications on file prior to visit.    Family History  Problem Relation Age of Onset  . Diabetes Father   . Hypertension Father   . Cancer Mother     cervical    Social History   Social History  . Marital status: Single    Spouse name: N/A  . Number of children: N/A  . Years of education: N/A   Occupational History  . Not on file.   Social History Main Topics  . Smoking status: Current Every Day Smoker    Packs/day: 0.25    Types: Cigarettes  . Smokeless tobacco: Never Used     Comment: trying to slow  . Alcohol use 0.6 oz/week    1 Standard drinks or equivalent per week     Comment: socially  . Drug use: No  . Sexual activity: Yes    Birth control/ protection: Surgical   Other Topics Concern  . Not on file   Social History Narrative  . No narrative on file    Review of Systems: Constitutional: Negative for fever, chills, appetite change, weight loss. Positive for fatigue Skin: Negative for rashes or lesions of concern. HENT: Negative for ear pain, ear discharge.nose bleeds Eyes: Negative for pain, discharge, redness, itching and visual disturbance. Neck: Negative for pain, stiffness Respiratory: Negative for cough, shortness of breath,   Cardiovascular: Negative  for chest pain, palpitations. Positive for ankle swelling with prolonged standing Gastrointestinal: Negative for abdominal pain. Reports occ N/V,heartburn. Reports daily diarrhea. Genitourinary: Negative for dysuria, urgency, frequency, hematuria,  Musculoskeletal: Positive for upper back and knee pain Neurological: Negative for dizziness, tremors, seizures, syncope,   light-headedness, numbness. Reports frequent headaches Hematological: Negative for easy bruising or bleeding Psychiatric/Behavioral: Positive  for depression, anxiety.   Objective:   Vitals:   06/05/16 1021  BP: 139/64  Pulse: 98  Resp: 18  Temp: 98.4 F (36.9 C)     Physical Exam: Constitutional: Patient appears well-developed and well-nourished. No distress. HENT: Normocephalic, atraumatic, External right and left ear normal. Oropharynx is clear and moist.  Eyes: Conjunctivae and EOM are normal. PERRLA, no scleral icterus. Neck: Normal ROM. Neck supple. No lymphadenopathy, No thyromegaly. CVS: RRR, S1/S2 +, no murmurs, no gallops, no rubs Pulmonary: Effort and breath sounds normal, no stridor, rhonchi, wheezes, rales.  Abdominal: Soft. Normoactive BS,, no distension, tenderness, rebound or guarding.  Musculoskeletal: Normal range of motion. No edema and no tenderness.  Neuro: Alert.Normal muscle tone coordination. Non-focal Skin: Skin is warm and dry. No rash noted. Not diaphoretic. No erythema. No pallor. Psychiatric: Normal mood and affect. Behavior, judgment, thought content normal.  Lab Results  Component Value Date   WBC 5.4 06/05/2016   HGB 13.0 06/05/2016   HCT 39.0 06/05/2016   MCV 91.1 06/05/2016   PLT 198 06/05/2016   Lab Results  Component Value Date   CREATININE 0.76 06/05/2016   BUN 9 06/05/2016   NA 134 (L) 06/05/2016   K 4.1 06/05/2016   CL 104 06/05/2016   CO2 22 06/05/2016    Lab Results  Component Value Date   HGBA1C 10.3 06/05/2016   Lipid Panel     Component Value Date/Time   CHOL 199 12/03/2015 0919   TRIG 63 12/03/2015 0919   HDL 55 12/03/2015 0919   CHOLHDL 3.6 12/03/2015 0919   VLDL 13 12/03/2015 0919   LDLCALC 131 (H) 12/03/2015 0919       Assessment and plan:   1. Diabetes mellitus of other type with complication (HCC)  Will add insulin. - Microalbumin, urine - COMPLETE METABOLIC PANEL WITH GFR - CBC with Differential - Ambulatory referral to Ophthalmology - HgB A1c - insulin aspart (novoLOG) injection 10 Units; Inject 0.1 mLs (10 Units total) into the skin once.    Return in about 3 months (around 09/05/2016).  The patient was given clear instructions to go to ER or return to medical  center if symptoms don't improve, worsen or new problems develop. The patient verbalized understanding.    Henrietta HooverLinda C Maghan Jessee FNP  06/08/2016, 10:00 AM

## 2016-07-03 ENCOUNTER — Other Ambulatory Visit: Payer: Self-pay | Admitting: Family Medicine

## 2016-07-03 DIAGNOSIS — E138 Other specified diabetes mellitus with unspecified complications: Secondary | ICD-10-CM

## 2016-07-08 ENCOUNTER — Ambulatory Visit (INDEPENDENT_AMBULATORY_CARE_PROVIDER_SITE_OTHER): Payer: Medicaid Other | Admitting: Infectious Diseases

## 2016-07-08 VITALS — BP 164/91 | HR 90 | Temp 98.0°F | Wt 288.0 lb

## 2016-07-08 DIAGNOSIS — E119 Type 2 diabetes mellitus without complications: Secondary | ICD-10-CM | POA: Diagnosis not present

## 2016-07-08 DIAGNOSIS — F329 Major depressive disorder, single episode, unspecified: Secondary | ICD-10-CM

## 2016-07-08 DIAGNOSIS — F32A Depression, unspecified: Secondary | ICD-10-CM

## 2016-07-08 DIAGNOSIS — B20 Human immunodeficiency virus [HIV] disease: Secondary | ICD-10-CM | POA: Diagnosis not present

## 2016-07-08 DIAGNOSIS — I10 Essential (primary) hypertension: Secondary | ICD-10-CM

## 2016-07-08 DIAGNOSIS — K0510 Chronic gingivitis, plaque induced: Secondary | ICD-10-CM | POA: Insufficient documentation

## 2016-07-08 LAB — CBC
HCT: 38.5 % (ref 35.0–45.0)
Hemoglobin: 12.7 g/dL (ref 11.7–15.5)
MCH: 30.3 pg (ref 27.0–33.0)
MCHC: 33 g/dL (ref 32.0–36.0)
MCV: 91.9 fL (ref 80.0–100.0)
MPV: 11.1 fL (ref 7.5–12.5)
PLATELETS: 208 10*3/uL (ref 140–400)
RBC: 4.19 MIL/uL (ref 3.80–5.10)
RDW: 13.4 % (ref 11.0–15.0)
WBC: 5.1 10*3/uL (ref 3.8–10.8)

## 2016-07-08 LAB — COMPREHENSIVE METABOLIC PANEL
ALK PHOS: 74 U/L (ref 33–115)
ALT: 16 U/L (ref 6–29)
AST: 14 U/L (ref 10–30)
Albumin: 3.4 g/dL — ABNORMAL LOW (ref 3.6–5.1)
BUN: 13 mg/dL (ref 7–25)
CHLORIDE: 103 mmol/L (ref 98–110)
CO2: 22 mmol/L (ref 20–31)
CREATININE: 0.74 mg/dL (ref 0.50–1.10)
Calcium: 8.5 mg/dL — ABNORMAL LOW (ref 8.6–10.2)
GLUCOSE: 346 mg/dL — AB (ref 65–99)
POTASSIUM: 4.2 mmol/L (ref 3.5–5.3)
SODIUM: 133 mmol/L — AB (ref 135–146)
Total Bilirubin: 0.3 mg/dL (ref 0.2–1.2)
Total Protein: 7.6 g/dL (ref 6.1–8.1)

## 2016-07-08 LAB — TSH: TSH: 1.42 mIU/L

## 2016-07-08 MED ORDER — AMOXICILLIN 500 MG PO CAPS: 500.0000 mg | ORAL_CAPSULE | Freq: Three times a day (TID) | ORAL | 0 refills | Status: DC

## 2016-07-08 MED ORDER — FLUCONAZOLE 100 MG PO TABS: 100.0000 mg | ORAL_TABLET | Freq: Every day | ORAL | 0 refills | Status: DC

## 2016-07-08 NOTE — Progress Notes (Signed)
   Subjective:    Patient ID: Sherry Hall, female    DOB: 1981/06/29, 35 y.o.   MRN: [id number]  HPI 35 yo F with hx of HIV+ since 2011, [facility]. Has had issues with making f/u appt's there. [place]  No HIV related hospitalizations. Has been taking complera, zofran.  DM1 since 35 yo, takes metformin, lisinopril/hctz Had [facility] referred to [facility].    Has been doing well with ART. Upset today over wt gain. Also concerned aobut lack of energy.  She has been having abscesses in her mouth. States that they move around. Has been taking a lot of OTCs (NSAIDs). Has had trouble getting f/[facility] from [facility] due to clinic full.  Worried she has a yeast infection.   Tearful about her wt gain. Filing for disability (she was denied). Frustrated that she does not feel better.  Only eats bid. Has a lot of abd pain and cramping. 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  Walks 1/4 mi daily. Has knee pain if over exertion.   HIV 1 RNA Quant (copies/mL)  Date Value  04/01/2016 261 (H)  12/17/2015 608 (H)  12/03/2015 293 (H)   CD4 T Cell Abs (/uL)  Date Value  04/01/2016 420  12/03/2015 450     Review of Systems  Constitutional: Positive for unexpected weight change. Negative for appetite change.  HENT: Positive for mouth sores.   Psychiatric/Behavioral: Positive for dysphoric mood.       Objective:   Physical Exam  Constitutional: She appears well-developed and well-nourished.  HENT:  Mouth/Throat: No oropharyngeal exudate.  Eyes: EOM are normal. Pupils are equal, round, and reactive to light.  Cardiovascular: Normal rate, regular rhythm and normal heart sounds.   Pulmonary/Chest: Effort normal and breath sounds normal.  Abdominal: Soft. Bowel sounds are normal. There is no tenderness. There is no rebound.  Musculoskeletal: She exhibits no edema.  Lymphadenopathy:    She has no cervical adenopathy.         Assessment & Plan:

## 2016-07-08 NOTE — Assessment & Plan Note (Signed)
Will give her rx for amoxil for 7 days Preventive rx for diflucan.

## 2016-07-08 NOTE — Assessment & Plan Note (Signed)
Will f/u with PCP.  

## 2016-07-08 NOTE — Assessment & Plan Note (Signed)
Will recheck her labs today She has had flu shot.  Given condoms.  Will see her back in 3- 4 months.  Pap per PCP

## 2016-07-08 NOTE — Assessment & Plan Note (Signed)
She is morbidly obese. Will have her seen by [facility].

## 2016-07-08 NOTE — Assessment & Plan Note (Signed)
She [facility] f[facility] with [facility]. I offered her our counselor today, she refuses.

## 2016-07-09 LAB — HIV-1 RNA ULTRAQUANT REFLEX TO GENTYP+
HIV 1 RNA QUANT: 7518 {copies}/mL — AB (ref <20)
HIV-1 RNA QUANT, LOG: 3.88 {Log_copies}/mL — AB (ref <1.30)

## 2016-07-09 LAB — T-HELPER CELL (CD4) - (RCID CLINIC ONLY)
CD4 % Helper T Cell: 24 % — ABNORMAL LOW (ref 33–55)
CD4 T CELL ABS: 410 /uL (ref 400–2700)

## 2016-07-16 LAB — HIV-1 GENOTYPR PLUS

## 2016-07-20 ENCOUNTER — Other Ambulatory Visit: Payer: Self-pay | Admitting: Family Medicine

## 2016-07-20 ENCOUNTER — Telehealth: Payer: Self-pay | Admitting: Family Medicine

## 2016-07-20 MED ORDER — ALPRAZOLAM 0.5 MG PO TABS: 0.5000 mg | ORAL_TABLET | Freq: Three times a day (TID) | ORAL | 0 refills | Status: DC | PRN

## 2016-07-20 NOTE — Telephone Encounter (Signed)
Patient had left message that her son passed and she needed medication to calm her nerves. Sherry LivingLinda Bernhardt, NP ordered Xanax. I called patient and advised her to come pick up and let us know if she needed anything additional.Patient states she will come by today to pick up.

## 2016-08-06 ENCOUNTER — Other Ambulatory Visit: Payer: Self-pay

## 2016-08-06 NOTE — Telephone Encounter (Signed)
Refill request for xanax. Please advise. Thanks! LOV 06/05/2016

## 2016-08-07 ENCOUNTER — Other Ambulatory Visit: Payer: Self-pay | Admitting: Family Medicine

## 2016-08-07 MED ORDER — ALPRAZOLAM 0.5 MG PO TABS: 0.5000 mg | ORAL_TABLET | Freq: Three times a day (TID) | ORAL | 0 refills | Status: DC | PRN

## 2016-08-20 ENCOUNTER — Telehealth: Payer: Self-pay

## 2016-08-20 NOTE — Telephone Encounter (Signed)
Linda please advise. Thanks!  

## 2016-08-20 NOTE — Telephone Encounter (Signed)
Called number listed as home. No answer. Left message for patient to return call.

## 2016-08-20 NOTE — Telephone Encounter (Signed)
Not until 11/20

## 2016-08-21 NOTE — Telephone Encounter (Signed)
Patient came to pick up prescription for xanax.  Per providers note, prescription not available until 09-07-2016.  Explained to patient that previous prescription was to last for the entire month as she should not be taking it daily.  / Discussed with provider, and explained to patient that her next prescription has to last 30 days.  Patient will come back on 09-04-2016 to get her prescription for xanax

## 2016-08-26 ENCOUNTER — Other Ambulatory Visit: Payer: Self-pay | Admitting: Infectious Diseases

## 2016-08-26 ENCOUNTER — Other Ambulatory Visit: Payer: Self-pay | Admitting: Family Medicine

## 2016-08-26 DIAGNOSIS — E10610 Type 1 diabetes mellitus with diabetic neuropathic arthropathy: Secondary | ICD-10-CM

## 2016-08-26 DIAGNOSIS — I10 Essential (primary) hypertension: Secondary | ICD-10-CM

## 2016-08-26 DIAGNOSIS — F411 Generalized anxiety disorder: Secondary | ICD-10-CM

## 2016-08-28 ENCOUNTER — Other Ambulatory Visit: Payer: Self-pay | Admitting: Family Medicine

## 2016-08-28 MED ORDER — ALPRAZOLAM 0.5 MG PO TABS: 0.5000 mg | ORAL_TABLET | Freq: Three times a day (TID) | ORAL | 0 refills | Status: DC | PRN

## 2016-09-08 ENCOUNTER — Ambulatory Visit: Payer: Medicaid Other | Admitting: Family Medicine

## 2016-09-24 NOTE — Addendum Note (Signed)
Addended by: Deatra CanterSMITH, Billyjoe Go M on: 09/24/2016 09:23 AM   Modules accepted: Orders

## 2016-10-20 ENCOUNTER — Ambulatory Visit (INDEPENDENT_AMBULATORY_CARE_PROVIDER_SITE_OTHER): Payer: Medicaid Other | Admitting: Family Medicine

## 2016-10-20 ENCOUNTER — Encounter: Payer: Self-pay | Admitting: Family Medicine

## 2016-10-20 VITALS — BP 128/71 | HR 116 | Temp 98.5°F | Resp 16 | Ht 60.0 in | Wt 281.0 lb

## 2016-10-20 DIAGNOSIS — B9689 Other specified bacterial agents as the cause of diseases classified elsewhere: Secondary | ICD-10-CM

## 2016-10-20 DIAGNOSIS — N760 Acute vaginitis: Secondary | ICD-10-CM

## 2016-10-20 DIAGNOSIS — F32A Depression, unspecified: Secondary | ICD-10-CM

## 2016-10-20 DIAGNOSIS — I10 Essential (primary) hypertension: Secondary | ICD-10-CM

## 2016-10-20 DIAGNOSIS — E119 Type 2 diabetes mellitus without complications: Secondary | ICD-10-CM

## 2016-10-20 DIAGNOSIS — B20 Human immunodeficiency virus [HIV] disease: Secondary | ICD-10-CM | POA: Diagnosis not present

## 2016-10-20 DIAGNOSIS — F17200 Nicotine dependence, unspecified, uncomplicated: Secondary | ICD-10-CM | POA: Insufficient documentation

## 2016-10-20 DIAGNOSIS — F329 Major depressive disorder, single episode, unspecified: Secondary | ICD-10-CM

## 2016-10-20 DIAGNOSIS — L298 Other pruritus: Secondary | ICD-10-CM | POA: Diagnosis not present

## 2016-10-20 DIAGNOSIS — N898 Other specified noninflammatory disorders of vagina: Secondary | ICD-10-CM

## 2016-10-20 LAB — POCT WET PREP (WET MOUNT)

## 2016-10-20 LAB — POCT GLYCOSYLATED HEMOGLOBIN (HGB A1C): Hemoglobin A1C: 10

## 2016-10-20 LAB — GLUCOSE, CAPILLARY
GLUCOSE-CAPILLARY: 419 mg/dL — AB (ref 65–99)
Glucose-Capillary: 386 mg/dL — ABNORMAL HIGH (ref 65–99)

## 2016-10-20 MED ORDER — METRONIDAZOLE 0.75 % VA GEL: 1.0000 | Freq: Two times a day (BID) | VAGINAL | 0 refills | Status: AC

## 2016-10-20 MED ORDER — FLUCONAZOLE 150 MG PO TABS: 150.0000 mg | ORAL_TABLET | Freq: Once | ORAL | 0 refills | Status: AC

## 2016-10-20 NOTE — Progress Notes (Signed)
Subjective:    Patient ID: Sherry Hall, female    DOB: 1981/02/09, 36 y.o.   MRN: [id number]  HPI Sherry Hall, a 36 year old female with a history of DMII, hypertension, HIV, and depression presents for a follow-up of chronic conditions.  Sherry Hall has  type 2 diabetes mellitus. She states that she was diagnosed at age 65. She is currently on Janumet BID and Lantus. Blood sugar is markedly elevated on arrival at 419. She says that she has not been taking medication or check blood sugars consistently. She last had antidiabetic medications 2 days ago and has not been following a carbohydrate diet or exercising.   She reports fatigue and polyuria.  Patient denies foot ulcerations, hyperglycemia, paresthesia of the feet, polydipsia, visual disturbances, vomitting and weight loss.   She also has a history of hypertension.  She is not exercising and is not adherent to low salt diet.  Patient is not currently checking blood sugars at home.  Patient denies chest pain, dyspnea, irregular heart beat, syncope and tachypnea.  Cardiovascular risk factors include: she is an everyday tobacco smoker, morbid obesity, and sedentary lifestyle.   Sherry Hall complains of depression and anxiety. She states that she has been experiencing symptoms for the past several months due to the tragic loss of her son. She is under the care of [facility].  She has also met with a counselor at infectious disease without success. She maintains that she has been on medications in the past, but has never completed counseling.  She has the following symptoms: anhedonia,  difficulty concentrating, feelings of losing control, insomnia, irritable, racing thoughts.  Symptoms occur intermittently. She denies current suicidal and homicidal ideation.   Past Medical History:  Diagnosis Date  . Depression   . Diabetes mellitus type 1 ([place])   . HIV (human immunodeficiency virus infection) ([place]) 2011   . Hypertension    Immunization History  Administered Date(s) Administered  . Hepatitis A, Adult 01/28/2010, 03/11/2010  . Hepatitis B 01/28/2010, 03/11/2010  . Influenza,inj,Quad PF,36+ Mos 09/16/2015  . PPD Test 01/11/2013  . Pneumococcal Conjugate-13 01/03/2016  . Pneumococcal Polysaccharide-23 01/28/2010, 10/20/2012  . Tdap 01/11/2013   Social History   Social History  . Marital status: Single    Spouse name: N/A  . Number of children: N/A  . Years of education: N/A   Occupational History  . Not on file.   Social History Main Topics  . Smoking status: Current Every Day Smoker    Packs/day: 0.25    Types: Cigarettes  . Smokeless tobacco: Never Used     Comment: trying to slow  . Alcohol use 0.6 oz/week    1 Standard drinks or equivalent per week     Comment: socially  . Drug use: No  . Sexual activity: Yes    Birth control/ protection: Surgical   Other Topics Concern  . Not on file   Social History Narrative  . No narrative on file   Review of Systems  Constitutional: Positive for fatigue and unexpected weight change.  HENT: Negative.   Eyes: Negative.  Negative for photophobia and visual disturbance.  Respiratory: Positive for cough.   Cardiovascular: Negative.  Negative for chest pain, palpitations and leg swelling.  Gastrointestinal: Negative.  Negative for constipation and diarrhea.  Endocrine: Positive for polyuria. Negative for polydipsia and polyphagia.  Genitourinary: Positive for vaginal discharge (vaginal itching).  Musculoskeletal: Positive for myalgias.  Skin: Negative.   Allergic/Immunologic: Positive for  immunocompromised state (HIV+).  Neurological: Negative.  Negative for dizziness.  Hematological: Negative.   Psychiatric/Behavioral: Positive for decreased concentration, depression and sleep disturbance. The patient is nervous/anxious.        Patient tearful       Objective:   Physical Exam  Constitutional: She is oriented to person,  place, and time. She appears well-developed and well-nourished.  Morbid obesity  HENT:  Head: Normocephalic and atraumatic.  Right Ear: External ear normal.  Left Ear: External ear normal.  Nose: Nose normal.  Mouth/Throat: Oropharynx is clear and moist.  Eyes: EOM are normal. Pupils are equal, round, and reactive to light.  Neck: Normal range of motion. Neck supple.  Cardiovascular: Normal rate, regular rhythm, normal heart sounds and intact distal pulses.   Pulmonary/Chest: Effort normal and breath sounds normal. No apnea.  Abdominal: Soft. Bowel sounds are normal.  Increased abdominal girth  Musculoskeletal: Normal range of motion.  Neurological: She is alert and oriented to person, place, and time. She has normal reflexes.  Skin: Skin is warm and dry.  Psychiatric: Her behavior is normal. Judgment and thought content normal. Her mood appears anxious. She exhibits a depressed mood (Tearful). She expresses no suicidal ideation. She expresses no suicidal plans.     BP 128/71 (BP Location: Left Arm, Patient Position: Sitting, Cuff Size: Large)   Pulse (!) 116   Temp 98.5 F (36.9 C) (Oral)   Resp 16   Ht 5' (1.524 m)   Wt 281 lb (127.5 kg)   LMP 09/19/2016   SpO2 100%   BMI 54.88 kg/m  Assessment & Plan:  1. Essential hypertension Blood pressure is at goal on current bp medication, will continue medication at current dosage.   - Urinalysis, Routine w reflex microscopic 2. Controlled type 2 diabetes mellitus without complication, without long-term current use of insulin ([place]) Patient reports that she as not been taking medication consistently. There have been some probable non compliance issues here. I have discussed with her the great importance of following the treatment plan exactly as directed in order to achieve a good medical outcome. - COMPLETE METABOLIC PANEL WITH GFR - HgB A1c - Urinalysis, Routine w reflex microscopic  3. Depression, unspecified depression  type Follow up at [facility] as previously scheduled.  - buPROPion (WELLBUTRIN XL) 150 MG 24 hr tablet; Take 150 mg by mouth daily.  4. HIV disease ([place]) Patient maintains that she is taking medications consistently. Discussed the importance of adhering to medication regimen. She is to follow up with Dr. Johnnye Sima as previously scheduled.   5. Vaginal itching Clue cells and yeast present on microscopic exam.  - POCT Wet Prep ([place]) - fluconazole (DIFLUCAN) 150 MG tablet; Take 1 tablet (150 mg total) by mouth once.  Dispense: 3 tablet; Refill: 0  6. Bacterial vaginitis  - metroNIDAZOLE (METROGEL) 0.75 % vaginal gel; Place 1 Applicatorful vaginally 2 (two) times daily.  Dispense: 140 g; Refill: 0  7. Tobacco dependence Smoking cessation instruction/counseling given:  counseled patient on the dangers of tobacco use, advised patient to stop smoking, and reviewed strategies to maximize success  Kanisha Duba M, FNP  RTC: Tomorrow for fasting CBG; 3 months for chronic conditions    The patient was given clear instructions to go to ER or return to medical center if symptoms do not improve, worsen or new problems develop. The patient verbalized understanding. Will notify patient with laboratory results.

## 2016-10-20 NOTE — Patient Instructions (Addendum)
Sherry Hall will return in am for a glucose check.  Recommend that patient take medication consistently.  Continue to follow with [facility] Bacterial Vaginitis: Metronidazole 1 applicatorful at night for 7 days Vaginal yeast infections: Take 1 Diflucan tablet today. Repeat in 1 week if symptoms persist     Diabetes Mellitus and Exercise Exercising regularly is important for your overall health, especially when you have diabetes (diabetes mellitus). Exercising is not only about losing weight. It has many health benefits, such as increasing muscle strength and bone density and reducing body fat and stress. This leads to improved fitness, flexibility, and endurance, all of which result in better overall health. Exercise has additional benefits for people with diabetes, including:  Reducing appetite.  Helping to lower and control blood glucose.  Lowering blood pressure.  Helping to control amounts of fatty substances (lipids) in the blood, such as cholesterol and triglycerides.  Helping the body to respond better to insulin (improving insulin sensitivity).  Reducing how much insulin the body needs.  Decreasing the risk for heart disease by:  Lowering cholesterol and triglyceride levels.  Increasing the levels of good cholesterol.  Lowering blood glucose levels. What is my activity plan? Your health care provider or certified diabetes educator can help you make a plan for the type and frequency of exercise (activity plan) that works for you. Make sure that you:  Do at least 150 minutes of moderate-intensity or vigorous-intensity exercise each week. This could be brisk walking, biking, or water aerobics.  Do stretching and strength exercises, such as yoga or weightlifting, at least 2 times a week.  Spread out your activity over at least 3 days of the week.  Get some form of physical activity every day.  Do not go more than 2 days in a row without some kind  of physical activity.  Avoid being inactive for more than 90 minutes at a time. Take frequent breaks to walk or stretch.  Choose a type of exercise or activity that you enjoy, and set realistic goals.  Start slowly, and gradually increase the intensity of your exercise over time. What do I need to know about managing my diabetes?  Check your blood glucose before and after exercising.  If your blood glucose is higher than 240 mg/dL (38.7 mmol/L) before you exercise, check your urine for ketones. If you have ketones in your urine, do not exercise until your blood glucose returns to normal.  Know the symptoms of low blood glucose (hypoglycemia) and how to treat it. Your risk for hypoglycemia increases during and after exercise. Common symptoms of hypoglycemia can include:  Hunger.  Anxiety.  Sweating and feeling clammy.  Confusion.  Dizziness or feeling light-headed.  Increased heart rate or palpitations.  Blurry vision.  Tingling or numbness around the mouth, lips, or tongue.  Tremors or shakes.  Irritability.  Keep a rapid-acting carbohydrate snack available before, during, and after exercise to help prevent or treat hypoglycemia.  Avoid injecting insulin into areas of the body that are going to be exercised. For example, avoid injecting insulin into:  The arms, when playing tennis.  The legs, when jogging.  Keep records of your exercise habits. Doing this can help you and your health care provider adjust your diabetes management plan as needed. Write down:  Food that you eat before and after you exercise.  Blood glucose levels before and after you exercise.  The type and amount of exercise you have done.  When your insulin is  expected to peak, if you use insulin. Avoid exercising at times when your insulin is peaking.  When you start a new exercise or activity, work with your health care provider to make sure the activity is safe for you, and to adjust your  insulin, medicines, or food intake as needed.  Drink plenty of water while you exercise to prevent dehydration or heat stroke. Drink enough fluid to keep your urine clear or pale yellow. This information is not intended to replace advice given to you by your health care provider. Make sure you discuss any questions you have with your health care provider. Document Released: 12/26/2003 Document Revised: 04/24/2016 Document Reviewed: 03/16/2016 Elsevier Interactive Patient Education  2017 Elsevier Inc.  Diabetes and Foot Care Diabetes may cause you to have problems because of poor blood supply (circulation) to your feet and legs. This may cause the skin on your feet to become thinner, break easier, and heal more slowly. Your skin may become dry, and the skin may peel and crack. You may also have nerve damage in your legs and feet causing decreased feeling in them. You may not notice minor injuries to your feet that could lead to infections or more serious problems. Taking care of your feet is one of the most important things you can do for yourself. Follow these instructions at home:  Wear shoes at all times, even in the house. Do not go barefoot. Bare feet are easily injured.  Check your feet daily for blisters, cuts, and redness. If you cannot see the bottom of your feet, use a mirror or ask someone for help.  Wash your feet with warm water (do not use hot water) and mild soap. Then pat your feet and the areas between your toes until they are completely dry. Do not soak your feet as this can dry your skin.  Apply a moisturizing lotion or petroleum jelly (that does not contain alcohol and is unscented) to the skin on your feet and to dry, brittle toenails. Do not apply lotion between your toes.  Trim your toenails straight across. Do not dig under them or around the cuticle. File the edges of your nails with an emery board or nail file.  Do not cut corns or calluses or try to remove them with  medicine.  Wear clean socks or stockings every day. Make sure they are not too tight. Do not wear knee-high stockings since they may decrease blood flow to your legs.  Wear shoes that fit properly and have enough cushioning. To break in new shoes, wear them for just a few hours a day. This prevents you from injuring your feet. Always look in your shoes before you put them on to be sure there are no objects inside.  Do not cross your legs. This may decrease the blood flow to your feet.  If you find a minor scrape, cut, or break in the skin on your feet, keep it and the skin around it clean and dry. These areas may be cleansed with mild soap and water. Do not cleanse the area with peroxide, alcohol, or iodine.  When you remove an adhesive bandage, be sure not to damage the skin around it.  If you have a wound, look at it several times a day to make sure it is healing.  Do not use heating pads or hot water bottles. They may burn your skin. If you have lost feeling in your feet or legs, you may not know it is  happening until it is too late.  Make sure your health care provider performs a complete foot exam at least annually or more often if you have foot problems. Report any cuts, sores, or bruises to your health care provider immediately. Contact a health care provider if:  You have an injury that is not healing.  You have cuts or breaks in the skin.  You have an ingrown nail.  You notice redness on your legs or feet.  You feel burning or tingling in your legs or feet.  You have pain or cramps in your legs and feet.  Your legs or feet are numb.  Your feet always feel cold. Get help right away if:  There is increasing redness, swelling, or pain in or around a wound.  There is a red line that goes up your leg.  Pus is coming from a wound.  You develop a fever or as directed by your health care provider.  You notice a bad smell coming from an ulcer or wound. This information is  not intended to replace advice given to you by your health care provider. Make sure you discuss any questions you have with your health care provider. Document Released: 10/02/2000 Document Revised: 03/12/2016 Document Reviewed: 03/14/2013 Elsevier Interactive Patient Education  2017 Elsevier Inc.  Bacterial Vaginosis Bacterial vaginosis is an infection of the vagina. It happens when too many germs (bacteria) grow in the vagina. This infection puts you at risk for infections from sex (STIs). Treating this infection can lower your risk for some STIs. You should also treat this if you are pregnant. It can cause your baby to be born early. Follow these instructions at home: Medicines  Take over-the-counter and prescription medicines only as told by your doctor.  Take or use your antibiotic medicine as told by your doctor. Do not stop taking or using it even if you start to feel better. General instructions  If you your sexual partner is a woman, tell her that you have this infection. She needs to get treatment if she has symptoms. If you have a female partner, he does not need to be treated.  During treatment:  Avoid sex.  Do not douche.  Avoid alcohol as told.  Avoid breastfeeding as told.  Drink enough fluid to keep your pee (urine) clear or pale yellow.  Keep your vagina and butt (rectum) clean.  Wash the area with warm water every day.  Wipe from front to back after you use the toilet.  Keep all follow-up visits as told by your doctor. This is important. Preventing this condition  Do not douche.  Use only warm water to wash around your vagina.  Use protection when you have sex. This includes:  Latex condoms.  Dental dams.  Limit how many people you have sex with. It is best to only have sex with the same person (be monogamous).  Get tested for STIs. Have your partner get tested.  Wear underwear that is cotton or lined with cotton.  Avoid tight pants and pantyhose.  This is most important in summer.  Do not use any products that have nicotine or tobacco in them. These include cigarettes and e-cigarettes. If you need help quitting, ask your doctor.  Do not use illegal drugs.  Limit how much alcohol you drink. Contact a doctor if:  Your symptoms do not get better, even after you are treated.  You have more discharge or pain when you pee (urinate).  You have a fever.  You have pain in your belly (abdomen).  You have pain with sex.  Your bleed from your vagina between periods. Summary  This infection happens when too many germs (bacteria) grow in the vagina.  Treating this condition can lower your risk for some infections from sex (STIs).  You should also treat this if you are pregnant. It can cause early (premature) birth.  Do not stop taking or using your antibiotic medicine even if you start to feel better. This information is not intended to replace advice given to you by your health care provider. Make sure you discuss any questions you have with your health care provider. Document Released: 07/14/2008 Document Revised: 06/20/2016 Document Reviewed: 06/20/2016 Elsevier Interactive Patient Education  2017 [employer].

## 2016-10-21 ENCOUNTER — Ambulatory Visit (HOSPITAL_COMMUNITY)
Admission: RE | Admit: 2016-10-21 | Discharge: 2016-10-21 | Disposition: A | Discharge status: Home / Self Care | Payer: Medicaid Other | Source: Ambulatory Visit | Attending: Family Medicine | Admitting: Family Medicine

## 2016-10-21 ENCOUNTER — Other Ambulatory Visit: Payer: Self-pay | Admitting: Family Medicine

## 2016-10-21 ENCOUNTER — Ambulatory Visit (INDEPENDENT_AMBULATORY_CARE_PROVIDER_SITE_OTHER): Payer: Medicaid Other

## 2016-10-21 DIAGNOSIS — Z794 Long term (current) use of insulin: Principal | ICD-10-CM

## 2016-10-21 DIAGNOSIS — E118 Type 2 diabetes mellitus with unspecified complications: Secondary | ICD-10-CM | POA: Diagnosis not present

## 2016-10-21 DIAGNOSIS — R739 Hyperglycemia, unspecified: Secondary | ICD-10-CM | POA: Diagnosis not present

## 2016-10-21 DIAGNOSIS — E1165 Type 2 diabetes mellitus with hyperglycemia: Principal | ICD-10-CM

## 2016-10-21 DIAGNOSIS — IMO0001 Reserved for inherently not codable concepts without codable children: Secondary | ICD-10-CM

## 2016-10-21 LAB — COMPLETE METABOLIC PANEL WITH GFR
ALT: 12 U/L (ref 6–29)
AST: 12 U/L (ref 10–30)
Albumin: 3.6 g/dL (ref 3.6–5.1)
Alkaline Phosphatase: 74 U/L (ref 33–115)
BUN: 10 mg/dL (ref 7–25)
CHLORIDE: 102 mmol/L (ref 98–110)
CO2: 19 mmol/L — AB (ref 20–31)
Calcium: 8.8 mg/dL (ref 8.6–10.2)
Creat: 0.9 mg/dL (ref 0.50–1.10)
GFR, EST NON AFRICAN AMERICAN: 82 mL/min (ref >=60)
GLUCOSE: 379 mg/dL — AB (ref 65–99)
Potassium: 3.8 mmol/L (ref 3.5–5.3)
SODIUM: 134 mmol/L — AB (ref 135–146)
Total Bilirubin: 0.5 mg/dL (ref 0.2–1.2)
Total Protein: 7.7 g/dL (ref 6.1–8.1)

## 2016-10-21 LAB — URINALYSIS, MICROSCOPIC ONLY
CASTS: NONE SEEN [LPF]
CRYSTALS: NONE SEEN [HPF]
Yeast: NONE SEEN [HPF]

## 2016-10-21 LAB — URINALYSIS, ROUTINE W REFLEX MICROSCOPIC
Bilirubin Urine: NEGATIVE
Hgb urine dipstick: NEGATIVE
LEUKOCYTES UA: NEGATIVE
NITRITE: NEGATIVE
PH: 6 (ref 5.0–8.0)
SPECIFIC GRAVITY, URINE: 1.038 — AB (ref 1.001–1.035)

## 2016-10-21 LAB — GLUCOSE, CAPILLARY
GLUCOSE-CAPILLARY: 213 mg/dL — AB (ref 65–99)
Glucose-Capillary: 357 mg/dL — ABNORMAL HIGH (ref 65–99)

## 2016-10-21 MED ORDER — INSULIN REGULAR HUMAN 100 UNIT/ML IJ SOLN
10.0000 [IU] | Freq: Once | INTRAMUSCULAR | Status: AC
  Administered 2016-10-21: 10 [IU] via SUBCUTANEOUS

## 2016-10-21 MED ORDER — INSULIN PEN NEEDLE 32G X 6 MM MISC: 1.0000 | Freq: Three times a day (TID) | 11 refills | Status: DC

## 2016-10-21 MED ORDER — ACCU-CHEK SOFT TOUCH LANCETS MISC: 12 refills | Status: DC

## 2016-10-21 MED ORDER — GLUCOSE BLOOD VI STRP: ORAL_STRIP | 12 refills | Status: DC

## 2016-10-21 MED ORDER — INSULIN ASPART 100 UNIT/ML FLEXPEN: PEN_INJECTOR | SUBCUTANEOUS | 11 refills | Status: DC

## 2016-10-21 MED ORDER — ACCU-CHEK AVIVA PLUS W/DEVICE KIT: 1.0000 | PACK | Freq: Three times a day (TID) | 0 refills | Status: DC

## 2016-10-21 MED ORDER — SODIUM CHLORIDE 0.9 % IV BOLUS (SEPSIS)
500.0000 mL | Freq: Once | INTRAVENOUS | Status: AC
  Administered 2016-10-21: 500 mL via INTRAVENOUS

## 2016-10-21 NOTE — Progress Notes (Signed)
Sherry Hall, a 36 year old female with a history of type 2 diabetes mellitus presents for a fasting CBG. Blood glucose is 357. Patient received 10 units of Novolin R. She will also transition to the day infusion center for a 500 ml fluid bolus. Will re-check CBG following fluid bolus.  Will start Novolog per sliding scale. Patient will also check blood sugars 3 times per day and at bedtime. She will follow up in office in 2 weeks for DMII.   Meds ordered this encounter  Medications  . Lancets (ACCU-CHEK SOFT TOUCH) lancets    Sig: Use as instructed    Dispense:  100 each    Refill:  12  . Blood Glucose Monitoring Suppl (ACCU-CHEK AVIVA PLUS) w/Device KIT    Sig: 1 each by Does not apply route 4 (four) times daily - after meals and at bedtime.    Dispense:  1 kit    Refill:  0  . glucose blood (ACCU-CHEK AVIVA) test strip    Sig: Use as instructed    Dispense:  100 each    Refill:  12  . insulin aspart (NOVOLOG) 100 UNIT/ML FlexPen    Sig: Per sliding scale    Dispense:  15 mL    Refill:  11    150-199; 2 units, 200-249; 4 units, 250-299; 6 units; 300-349; 8 units, greater than 350; 10 units  . Insulin Pen Needle (BD ULTRA-FINE MICRO PEN NEEDLE) 32G X 6 MM MISC    Sig: 1 each by Does not apply route 3 (three) times daily before meals.    Dispense:  100 each    Refill:  59     Ilisha Blust M, FNP

## 2016-10-21 NOTE — Procedures (Signed)
[facility]  Procedure Note  Sherry Hall WUJ:[id number] DOB: Feb 14, 1981 DOA: 10/21/2016   PCP: Massie MaroonHollis,Lachina M, FNP   Associated Diagnosis: R73.9  Procedure Note:  500 mls of NS infused per order.  Fingerstick blood sugar checked post infusion   Condition During Procedure: stable   Condition at Discharge:stable   Lanae BoastATUM, Daveda Larock, RN  [facility]

## 2016-11-09 ENCOUNTER — Other Ambulatory Visit: Payer: Medicaid Other

## 2016-11-17 ENCOUNTER — Telehealth: Payer: Self-pay

## 2016-11-17 DIAGNOSIS — Z794 Long term (current) use of insulin: Principal | ICD-10-CM

## 2016-11-17 DIAGNOSIS — E1165 Type 2 diabetes mellitus with hyperglycemia: Principal | ICD-10-CM

## 2016-11-17 DIAGNOSIS — IMO0001 Reserved for inherently not codable concepts without codable children: Secondary | ICD-10-CM

## 2016-11-17 MED ORDER — GLUCOSE BLOOD VI STRP: ORAL_STRIP | 12 refills | Status: DC

## 2016-11-17 NOTE — Telephone Encounter (Signed)
Refill has been sent into pharmacy. Thanks!  

## 2016-11-19 ENCOUNTER — Encounter (INDEPENDENT_AMBULATORY_CARE_PROVIDER_SITE_OTHER): Payer: Medicaid Other

## 2016-11-19 DIAGNOSIS — E1165 Type 2 diabetes mellitus with hyperglycemia: Secondary | ICD-10-CM

## 2016-11-19 LAB — GLUCOSE, CAPILLARY: Glucose-Capillary: 125 mg/dL — ABNORMAL HIGH (ref 65–99)

## 2016-11-23 ENCOUNTER — Other Ambulatory Visit: Payer: Self-pay | Admitting: Infectious Diseases

## 2016-11-23 ENCOUNTER — Encounter: Payer: Self-pay | Admitting: Infectious Diseases

## 2016-11-23 ENCOUNTER — Ambulatory Visit (INDEPENDENT_AMBULATORY_CARE_PROVIDER_SITE_OTHER): Payer: Medicaid Other | Admitting: Infectious Diseases

## 2016-11-23 VITALS — BP 118/83 | HR 77 | Temp 98.2°F | Ht 60.0 in | Wt 286.0 lb

## 2016-11-23 DIAGNOSIS — Z113 Encounter for screening for infections with a predominantly sexual mode of transmission: Secondary | ICD-10-CM | POA: Diagnosis not present

## 2016-11-23 DIAGNOSIS — F32A Depression, unspecified: Secondary | ICD-10-CM

## 2016-11-23 DIAGNOSIS — E118 Type 2 diabetes mellitus with unspecified complications: Secondary | ICD-10-CM | POA: Diagnosis not present

## 2016-11-23 DIAGNOSIS — Z794 Long term (current) use of insulin: Secondary | ICD-10-CM

## 2016-11-23 DIAGNOSIS — B20 Human immunodeficiency virus [HIV] disease: Secondary | ICD-10-CM | POA: Diagnosis present

## 2016-11-23 DIAGNOSIS — Z79899 Other long term (current) drug therapy: Secondary | ICD-10-CM | POA: Diagnosis not present

## 2016-11-23 DIAGNOSIS — F329 Major depressive disorder, single episode, unspecified: Secondary | ICD-10-CM | POA: Diagnosis not present

## 2016-11-23 LAB — COMPREHENSIVE METABOLIC PANEL
ALBUMIN: 3.2 g/dL — AB (ref 3.6–5.1)
ALT: 12 U/L (ref 6–29)
AST: 11 U/L (ref 10–30)
Alkaline Phosphatase: 65 U/L (ref 33–115)
BUN: 12 mg/dL (ref 7–25)
CHLORIDE: 104 mmol/L (ref 98–110)
CO2: 25 mmol/L (ref 20–31)
CREATININE: 0.77 mg/dL (ref 0.50–1.10)
Calcium: 8.4 mg/dL — ABNORMAL LOW (ref 8.6–10.2)
Glucose, Bld: 176 mg/dL — ABNORMAL HIGH (ref 65–99)
POTASSIUM: 3.8 mmol/L (ref 3.5–5.3)
SODIUM: 137 mmol/L (ref 135–146)
Total Bilirubin: 0.2 mg/dL (ref 0.2–1.2)
Total Protein: 6.8 g/dL (ref 6.1–8.1)

## 2016-11-23 LAB — CBC
HCT: 33.8 % — ABNORMAL LOW (ref 35.0–45.0)
HEMOGLOBIN: 11.4 g/dL — AB (ref 11.7–15.5)
MCH: 31 pg (ref 27.0–33.0)
MCHC: 33.7 g/dL (ref 32.0–36.0)
MCV: 91.8 fL (ref 80.0–100.0)
MPV: 10.6 fL (ref 7.5–12.5)
Platelets: 212 10*3/uL (ref 140–400)
RBC: 3.68 MIL/uL — ABNORMAL LOW (ref 3.80–5.10)
RDW: 13.5 % (ref 11.0–15.0)
WBC: 6.9 10*3/uL (ref 3.8–10.8)

## 2016-11-23 LAB — LIPID PANEL
CHOL/HDL RATIO: 3.6 ratio (ref <5.0)
Cholesterol: 167 mg/dL (ref <200)
HDL: 47 mg/dL — ABNORMAL LOW (ref >50)
LDL CALC: 97 mg/dL (ref <100)
TRIGLYCERIDES: 116 mg/dL (ref <150)
VLDL: 23 mg/dL (ref <30)

## 2016-11-23 NOTE — Addendum Note (Signed)
Addended by: Mariea ClontsGREEN, Conley Delisle D on: 11/23/2016 10:46 AM   Modules accepted: Orders

## 2016-11-23 NOTE — Assessment & Plan Note (Signed)
She is getting better at adherence.  Will check her labs today.  Will get her set up for pap.  Will see her back in 3 months.

## 2016-11-23 NOTE — Assessment & Plan Note (Signed)
She has appt with Bernette RedbirdKenny tomorrow.  Greatly appreciate his help.

## 2016-11-23 NOTE — Assessment & Plan Note (Signed)
greatly appreciate NP f/u. She is working on better control.

## 2016-11-23 NOTE — Progress Notes (Signed)
   Subjective:    Patient ID: Sherry Hall, female    DOB: August 02, 1981, 36 y.o.   MRN: [id number]  HPI 36 yo F with hx of HIV+ since 2011, [facility]. Complera --> odefsy.  DM1 since 36 yo, takes metformin, lisinopril/hctz Had first [facility] visit 08-2015. Since last visit her 38 yo son died. She has been going to counseling center. Her sleep has been better- she was rx trazadone.  FSG have been "pretty good". Eating habits "pretty bad", eating twice a day. Was seen at [facility] 10-20-16- A1C 10%.  Her 3 other sons are getting counseling. 1 has autism ADHD, 1 has dyslexia ADHD.  Has been taking her ART regularly for last month, prior was irregular.   HIV 1 RNA Quant (copies/mL)  Date Value  07/08/2016 7,518 (H)  04/01/2016 261 (H)  12/17/2015 608 (H)   CD4 T Cell Abs (/uL)  Date Value  07/08/2016 410  04/01/2016 420    Review of Systems  Constitutional: Negative for appetite change and unexpected weight change.  Gastrointestinal: Negative for constipation and diarrhea.  Genitourinary: Negative for difficulty urinating.  Psychiatric/Behavioral: Positive for decreased concentration, dysphoric mood and sleep disturbance.       Objective:   Physical Exam  Constitutional: She appears well-developed and well-nourished.  HENT:  Mouth/Throat: No oropharyngeal exudate.  Eyes: EOM are normal. Pupils are equal, round, and reactive to light.  Neck: Neck supple.  Cardiovascular: Normal rate, regular rhythm and normal heart sounds.   Pulmonary/Chest: Effort normal and breath sounds normal.  Abdominal: Soft. Bowel sounds are normal. There is no tenderness. There is no rebound.  Musculoskeletal: She exhibits no edema.  Lymphadenopathy:    She has no cervical adenopathy.       Assessment & Plan:

## 2016-11-24 ENCOUNTER — Ambulatory Visit: Payer: Medicaid Other

## 2016-11-24 DIAGNOSIS — F4310 Post-traumatic stress disorder, unspecified: Secondary | ICD-10-CM

## 2016-11-24 LAB — T-HELPER CELL (CD4) - (RCID CLINIC ONLY)
CD4 T CELL HELPER: 30 % — AB (ref 33–55)
CD4 T Cell Abs: 480 /uL (ref 400–2700)

## 2016-11-24 LAB — RPR

## 2016-11-24 NOTE — BH Specialist Note (Signed)
I met with Sherry Hall for the first time today and she was tearful throughout most of the session, talking about her son's murder back in September, 2017. I entered her information into [facility] system and we completed goals of improving mood and reducing anxiety. I gave her permission to experience her grief her way and told her not to let anyone tell her how or when to grieve.  She said she appreciated that. Plan to meet again in 2 weeks. Curley Spice, LCSW

## 2016-11-30 ENCOUNTER — Other Ambulatory Visit: Payer: Self-pay | Admitting: Infectious Diseases

## 2016-11-30 DIAGNOSIS — E10610 Type 1 diabetes mellitus with diabetic neuropathic arthropathy: Secondary | ICD-10-CM

## 2016-12-01 LAB — HIV-1 RNA,QN PCR W/REFLEX GENOTYPE
HIV-1 RNA, QN PCR: 1.42 {Log_copies}/mL — AB
HIV-1 RNA, QN PCR: 26 {copies}/mL — AB

## 2016-12-08 ENCOUNTER — Ambulatory Visit: Payer: Medicaid Other

## 2016-12-18 ENCOUNTER — Ambulatory Visit: Payer: Medicaid Other | Admitting: Family Medicine

## 2016-12-29 ENCOUNTER — Other Ambulatory Visit: Payer: Self-pay | Admitting: Family Medicine

## 2016-12-29 DIAGNOSIS — I10 Essential (primary) hypertension: Secondary | ICD-10-CM

## 2016-12-29 DIAGNOSIS — F411 Generalized anxiety disorder: Secondary | ICD-10-CM

## 2017-01-01 ENCOUNTER — Other Ambulatory Visit: Payer: Self-pay | Admitting: Family Medicine

## 2017-01-01 DIAGNOSIS — E785 Hyperlipidemia, unspecified: Secondary | ICD-10-CM

## 2017-01-18 ENCOUNTER — Other Ambulatory Visit: Payer: Self-pay | Admitting: Family Medicine

## 2017-05-17 ENCOUNTER — Other Ambulatory Visit: Payer: Self-pay | Admitting: Family Medicine

## 2017-05-24 ENCOUNTER — Other Ambulatory Visit: Payer: Self-pay | Admitting: Family Medicine

## 2017-06-11 ENCOUNTER — Encounter: Payer: Self-pay | Admitting: Infectious Diseases

## 2017-06-14 ENCOUNTER — Other Ambulatory Visit: Payer: Self-pay

## 2017-06-14 ENCOUNTER — Encounter: Payer: Self-pay | Admitting: Family Medicine

## 2017-06-14 ENCOUNTER — Telehealth: Payer: Self-pay

## 2017-06-14 ENCOUNTER — Other Ambulatory Visit (HOSPITAL_COMMUNITY)
Admission: RE | Admit: 2017-06-14 | Discharge: 2017-06-14 | Disposition: A | Discharge status: Home / Self Care | Payer: Medicaid Other | Source: Ambulatory Visit | Attending: Family Medicine | Admitting: Family Medicine

## 2017-06-14 ENCOUNTER — Ambulatory Visit (INDEPENDENT_AMBULATORY_CARE_PROVIDER_SITE_OTHER): Payer: Medicaid Other | Admitting: Family Medicine

## 2017-06-14 VITALS — BP 128/88 | HR 92 | Temp 98.0°F | Resp 14 | Ht 60.0 in | Wt 279.0 lb

## 2017-06-14 DIAGNOSIS — N898 Other specified noninflammatory disorders of vagina: Secondary | ICD-10-CM

## 2017-06-14 DIAGNOSIS — Z794 Long term (current) use of insulin: Secondary | ICD-10-CM | POA: Diagnosis not present

## 2017-06-14 DIAGNOSIS — I10 Essential (primary) hypertension: Secondary | ICD-10-CM

## 2017-06-14 DIAGNOSIS — E119 Type 2 diabetes mellitus without complications: Secondary | ICD-10-CM | POA: Diagnosis not present

## 2017-06-14 DIAGNOSIS — M62838 Other muscle spasm: Secondary | ICD-10-CM | POA: Diagnosis not present

## 2017-06-14 LAB — POCT URINALYSIS DIP (DEVICE)
Bilirubin Urine: NEGATIVE
GLUCOSE, UA: NEGATIVE mg/dL
KETONES UR: NEGATIVE mg/dL
Leukocytes, UA: NEGATIVE
NITRITE: NEGATIVE
PROTEIN: NEGATIVE mg/dL
Specific Gravity, Urine: <=1.005 (ref 1.005–1.030)
Urobilinogen, UA: 0.2 mg/dL (ref 0.0–1.0)
pH: 5.5 (ref 5.0–8.0)

## 2017-06-14 LAB — POCT GLYCOSYLATED HEMOGLOBIN (HGB A1C): HEMOGLOBIN A1C: 9.8

## 2017-06-14 MED ORDER — FLUCONAZOLE 150 MG PO TABS: 150.0000 mg | ORAL_TABLET | Freq: Once | ORAL | 0 refills | Status: DC

## 2017-06-14 MED ORDER — CYCLOBENZAPRINE HCL 10 MG PO TABS: 10.0000 mg | ORAL_TABLET | Freq: Three times a day (TID) | ORAL | 0 refills | Status: DC | PRN

## 2017-06-14 MED ORDER — LISINOPRIL-HYDROCHLOROTHIAZIDE 20-25 MG PO TABS: 1.0000 | ORAL_TABLET | Freq: Every day | ORAL | 3 refills | Status: DC

## 2017-06-14 MED ORDER — SITAGLIPTIN PHOS-METFORMIN HCL 50-1000 MG PO TABS: ORAL_TABLET | ORAL | 2 refills | Status: DC

## 2017-06-14 MED ORDER — FLUCONAZOLE 150 MG PO TABS: 150.0000 mg | ORAL_TABLET | Freq: Once | ORAL | 0 refills | Status: AC

## 2017-06-14 MED ORDER — INSULIN GLARGINE 100 UNIT/ML SOLOSTAR PEN: 20.0000 [IU] | PEN_INJECTOR | Freq: Every day | SUBCUTANEOUS | 3 refills | Status: DC

## 2017-06-14 NOTE — Telephone Encounter (Signed)
Medication refilled

## 2017-06-14 NOTE — Patient Instructions (Addendum)
For neck pain take cyclobenzaprine 10 mg may take up to 3 times daily as needed.  Take Diflucan 1 tablet, may repeat every 3-5 days if symptoms persist. I will send vaginal specimen out for analysis.  Your A1c today is 9.8 (A1C goal is <7) , I am increasing your Lantus to 20 units at bedtime. Be sure to eat a bedtime snack high in protein with administration of nighttime insulin.

## 2017-06-14 NOTE — Progress Notes (Signed)
Patient ID: Sherry Hall, female    DOB: [birth year], 36 y.o.   MRN: [id number]  PCP: Dorena Dew, FNP  Chief Complaint  Patient presents with  . Follow-up  . Vaginitis  . Neck Pain    Subjective:  HPI Sherry Hall is a 36 y.o. female with HIV, uncontrolled diabetes, morbid depression, current tobacco use presents for routine follow-up and complains of vaginal discharge and neck pain. Sherry Hall is followed by [facility] for [facility] and reports compliance with medication regimen. She suffers from diabetes and her last hemoglobin A1C 10.0. She admitted to past non-medication compliance and poor eating habits. Reports no routine physical activity or weight loss. Denies neuropathic pain, urinary frequency or visual disturbances. Current body mass index is 54.49 kg/m. Sherry Hall also complains of vaginal discharge with associated itching. She recently completed a course of antibiotics for an oral abscess and has subsequently developed symptoms. She has not attempted relief with any OTC preparations. [name] complains of a 2 week history of neck pain which she associates with her recent oral pain. She has full ROM and neck pain is not exacerbated with movement. She describes pain as cramping sensation which improves with rest. Denies recent or prior neck injury. 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 This month is the 92 month anniversary of her son's death and she is experiencing complicated grief. Currently receives behavioral health services from [place]. Denies any active thoughts of suicide or harm to others.  Social History   Social History  . Marital status: Single    Spouse name: N/A  . Number of children: N/A  . Years of education: N/A   Occupational History  . Not on file.   Social History Main Topics  . Smoking status: Current Every Day Smoker    Packs/day: 0.25    Types: Cigarettes  . Smokeless  tobacco: Never Used     Comment: trying to slow  . Alcohol use 0.6 oz/week    1 Standard drinks or equivalent per week     Comment: socially  . Drug use: No  . Sexual activity: Yes    Birth control/ protection: Surgical   Other Topics Concern  . Not on file   Social History Narrative  . No narrative on file    Family History  Problem Relation Age of Onset  . Diabetes Father   . Hypertension Father   . Cancer Mother        cervical    Review of Systems See HPI  Patient Active Problem List   Diagnosis Date Noted  . Tobacco dependence 10/20/2016  . Gingivitis 07/08/2016  . Type 2 diabetes mellitus with complication, with long-term current use of insulin ([place]) 01/03/2016  . HTN (hypertension) 09/16/2015  . HIV disease ([place]) 09/16/2015  . Depression 09/16/2015    Allergies  Allergen Reactions  . Latex Rash  . Percocet [Oxycodone-Acetaminophen] Hives, Itching and Rash    Prior to Admission medications   Medication Sig Start Date End Date Taking? Authorizing Provider  ALPRAZolam Duanne Moron) 0.5 MG tablet Take 1 tablet (0.5 mg total) by mouth 3 (three) times daily as needed for anxiety. 08/28/16  Yes Micheline Chapman, NP  atorvastatin (LIPITOR) 10 MG tablet TAKE 1 TABLET (10 MG TOTAL) BY MOUTH DAILY. 01/01/17  Yes Dorena Dew, FNP  Blood Glucose Monitoring Suppl (ACCU-CHEK AVIVA PLUS) w/Device KIT 1 each by Does not apply route 4 (four) times daily -  after meals and at bedtime. 10/21/16  Yes Dorena Dew, FNP  buPROPion (WELLBUTRIN XL) 150 MG 24 hr tablet Take 150 mg by mouth daily.   Yes [provider]  busPIRone (BUSPAR) 15 MG tablet TAKE 1 TABLET (15 MG TOTAL) BY MOUTH TWO (TWO) TIMES DAILY. 12/30/16  Yes Micheline Chapman, NP  glucose blood (ACCU-CHEK AVIVA) test strip Use as instructed 11/17/16  Yes Dorena Dew, FNP  ibuprofen (ADVIL,MOTRIN) 200 MG tablet Take 200 mg by mouth every 6 (six) hours as needed.   Yes [provider]  insulin  aspart (NOVOLOG) 100 UNIT/ML FlexPen Per sliding scale 10/21/16  Yes Dorena Dew, FNP  Insulin Pen Needle (BD ULTRA-FINE MICRO PEN NEEDLE) 32G X 6 MM MISC 1 each by Does not apply route 3 (three) times daily before meals. 10/21/16  Yes Dorena Dew, FNP  JANUMET 50-1000 MG tablet TAKE 1 TABLET BY MOUTH TWO (TWO) TIMES DAILY WITH A MEAL. 01/18/17  Yes Dorena Dew, FNP  Lancets (ACCU-CHEK SOFT TOUCH) lancets Use as instructed 10/21/16  Yes Dorena Dew, FNP  LANTUS SOLOSTAR 100 UNIT/ML Solostar Pen Inject 10 Units into the skin daily at 10 pm. 07/03/16  Yes Micheline Chapman, NP  lisinopril-hydrochlorothiazide (PRINZIDE,ZESTORETIC) 20-25 MG tablet TAKE 1 TABLET BY MOUTH DAILY. 12/30/16  Yes Micheline Chapman, NP  ODEFSEY 200-25-25 MG TABS tablet TAKE 1 TABLET BY MOUTH DAILY WITH BREAKFAST. 11/30/16  Yes Campbell Riches, MD  ondansetron (ZOFRAN) 4 MG tablet Take 4 mg by mouth every 8 (eight) hours as needed for nausea or vomiting.   Yes [provider]  propranolol (INDERAL) 10 MG tablet Take 10 mg by mouth 2 (two) times daily.   Yes [provider]  ranitidine (ZANTAC) 300 MG tablet Take 300 mg by mouth at bedtime.   Yes [provider]  traZODone (DESYREL) 50 MG tablet Take 50 mg by mouth at bedtime as needed for sleep. 10/28/16  Yes [provider]    Past Medical, Surgical Family and Social History reviewed and updated.    Objective:   Today's Vitals   06/14/17 0954  BP: 128/88  Pulse: 92  Resp: 14  Temp: 98 F (36.7 C)  TempSrc: Oral  SpO2: 100%  Weight: 279 lb (126.6 kg)  Height: 5' (1.524 m)    Wt Readings from Last 3 Encounters:  06/14/17 279 lb (126.6 kg)  11/23/16 286 lb (129.7 kg)  10/20/16 281 lb (127.5 kg)    Physical Exam  Constitutional: She is oriented to person, place, and time. She appears well-developed and well-nourished.  HENT:  Head: Normocephalic and atraumatic.  Eyes: Pupils are equal, round, and reactive  to light. Conjunctivae and EOM are normal.  Neck: Normal range of motion. Neck supple. No thyromegaly present.  Cardiovascular: Normal rate, regular rhythm, normal heart sounds and intact distal pulses.   Pulmonary/Chest: Effort normal and breath sounds normal.  Abdominal: Soft. Bowel sounds are normal.  Genitourinary:  Genitourinary Comments: Vaginal specimen self-collected   Lymphadenopathy:    She has no cervical adenopathy.  Neurological: She is alert and oriented to person, place, and time.  Skin: Skin is warm and dry.  Psychiatric: Her speech is normal and behavior is normal. Judgment and thought content normal. Cognition and memory are normal. She exhibits a depressed mood.   Assessment & Plan:  1. Type 2 diabetes mellitus without complication, with long-term current use of insulin ([place]), improving , although remains uncontrolled, current  A1c 9.8.  -Increasing Lantus 20 units at bedtime.   2. Essential hypertension, controlled today.  -Continue current regimen.  3. Vaginal discharge, likely resulting from recent antibiotic use. Will screen for STI. -Diflucan 1 tablet now and may repeat 3-5 days if symptoms persist.  4. Neck muscle spasm -Cyclobenzaprine 10 mg up to 3 times daily as needed for neck pain.   Orders Placed This Encounter  Procedures  . Microalbumin/Creatinine Ratio, Urine  . POCT glycosylated hemoglobin (Hb A1C)  . POCT urinalysis dip (device)   RTC: 3 months for chronic disease management.  Carroll Sage. Kenton Kingfisher, MSN, FNP-C [facility]  [address] [place],  [address] [phone]

## 2017-06-15 ENCOUNTER — Telehealth: Payer: Self-pay | Admitting: Family Medicine

## 2017-06-15 LAB — CERVICOVAGINAL ANCILLARY ONLY
Bacterial vaginitis: POSITIVE — AB
Candida vaginitis: NEGATIVE
Chlamydia: NEGATIVE
Neisseria Gonorrhea: NEGATIVE
Trichomonas: NEGATIVE

## 2017-06-15 LAB — MICROALBUMIN / CREATININE URINE RATIO
Creatinine, Urine: 56 mg/dL (ref 20–320)
MICROALB/CREAT RATIO: 198 ug/mg{creat} — AB (ref <30)
Microalb, Ur: 11.1 mg/dL

## 2017-06-15 MED ORDER — METRONIDAZOLE 500 MG PO TABS: 500.0000 mg | ORAL_TABLET | Freq: Two times a day (BID) | ORAL | 0 refills | Status: DC

## 2017-06-15 NOTE — Telephone Encounter (Signed)
Advise patient that her vaginal specimen was + for bacterial vaginosis. I am prescribing her metronidazole 500 mg twice daily for 7 days.   Sherry Hall. Tiburcio Pea, MSN, FNP-C [facility]  [address] [place], [place] [address] [phone]

## 2017-06-16 ENCOUNTER — Other Ambulatory Visit: Payer: Self-pay

## 2017-06-16 MED ORDER — METRONIDAZOLE 500 MG PO TABS: 500.0000 mg | ORAL_TABLET | Freq: Two times a day (BID) | ORAL | 0 refills | Status: DC

## 2017-06-16 NOTE — Telephone Encounter (Signed)
Medication resent to [employer]

## 2017-06-18 ENCOUNTER — Other Ambulatory Visit: Payer: Self-pay | Admitting: Infectious Diseases

## 2017-06-18 DIAGNOSIS — E10610 Type 1 diabetes mellitus with diabetic neuropathic arthropathy: Secondary | ICD-10-CM

## 2017-07-21 ENCOUNTER — Other Ambulatory Visit: Payer: Medicaid Other

## 2017-07-22 ENCOUNTER — Encounter: Payer: Self-pay | Admitting: Family Medicine

## 2017-07-22 ENCOUNTER — Ambulatory Visit (INDEPENDENT_AMBULATORY_CARE_PROVIDER_SITE_OTHER): Payer: Medicaid Other | Admitting: Family Medicine

## 2017-07-22 VITALS — BP 158/88 | HR 77 | Temp 98.1°F | Resp 16 | Ht 60.0 in | Wt 276.0 lb

## 2017-07-22 DIAGNOSIS — L910 Hypertrophic scar: Secondary | ICD-10-CM | POA: Diagnosis not present

## 2017-07-22 DIAGNOSIS — B379 Candidiasis, unspecified: Secondary | ICD-10-CM

## 2017-07-22 DIAGNOSIS — J869 Pyothorax without fistula: Secondary | ICD-10-CM | POA: Diagnosis not present

## 2017-07-22 DIAGNOSIS — T3695XA Adverse effect of unspecified systemic antibiotic, initial encounter: Secondary | ICD-10-CM | POA: Diagnosis not present

## 2017-07-22 LAB — CBC WITH DIFFERENTIAL/PLATELET
BASOS ABS: 38 {cells}/uL (ref 0–200)
Basophils Relative: 0.6 %
EOS ABS: 271 {cells}/uL (ref 15–500)
Eosinophils Relative: 4.3 %
HCT: 36.5 % (ref 35.0–45.0)
Hemoglobin: 12.4 g/dL (ref 11.7–15.5)
Lymphs Abs: 2438 cells/uL (ref 850–3900)
MCH: 31.1 pg (ref 27.0–33.0)
MCHC: 34 g/dL (ref 32.0–36.0)
MCV: 91.5 fL (ref 80.0–100.0)
MPV: 11.2 fL (ref 7.5–12.5)
Monocytes Relative: 4.4 %
NEUTROS PCT: 52 %
Neutro Abs: 3276 cells/uL (ref 1500–7800)
PLATELETS: 204 10*3/uL (ref 140–400)
RBC: 3.99 10*6/uL (ref 3.80–5.10)
RDW: 12.8 % (ref 11.0–15.0)
TOTAL LYMPHOCYTE: 38.7 %
WBC: 6.3 10*3/uL (ref 3.8–10.8)
WBCMIX: 277 {cells}/uL (ref 200–950)

## 2017-07-22 MED ORDER — SULFAMETHOXAZOLE-TRIMETHOPRIM 800-160 MG PO TABS: 1.0000 | ORAL_TABLET | Freq: Two times a day (BID) | ORAL | 0 refills | Status: DC

## 2017-07-22 MED ORDER — FLUCONAZOLE 150 MG PO TABS: 150.0000 mg | ORAL_TABLET | Freq: Once | ORAL | 0 refills | Status: AC

## 2017-07-22 NOTE — Patient Instructions (Signed)
Clean abscess with dial soap and water.  Leave area open to air, while at home Will start sulfamethoxazole-trimethoprim (BACTRIM DS,SEPTRA DS) 800-160 MG tablet; Take 1 tablet by mouth 2 (two) times daily.  Dispense: 20 tablet; Refill: 0 Will follow up by phone with any abnormal laboratory results.  - CBC with Differential    fluconazole (DIFLUCAN) 150 MG tablet; Take 1 tablet (150 mg total) by mouth once.  Dispense: 2 tablet; Refill: 0 I will send a referral to dermatology for further workup and evaluation of infected keloid

## 2017-07-25 ENCOUNTER — Other Ambulatory Visit: Payer: Self-pay | Admitting: Family Medicine

## 2017-07-25 DIAGNOSIS — L02213 Cutaneous abscess of chest wall: Secondary | ICD-10-CM

## 2017-07-25 LAB — WOUND CULTURE
MICRO NUMBER:: 81104100
SPECIMEN QUALITY: ADEQUATE

## 2017-07-25 MED ORDER — AMOXICILLIN 500 MG PO CAPS: 500.0000 mg | ORAL_CAPSULE | Freq: Three times a day (TID) | ORAL | 0 refills | Status: AC

## 2017-07-25 NOTE — Progress Notes (Signed)
  Sherry Hall, a 36 year old patient with a history of HIV, DMII, hypertension, and keloids presents complaining of a draining abscess to mid chest. She has a history of multiple keloids. Keloids have been removed in the past, but returned over time. She says that mid chest keloid has been progressively getting larger. She noticed that area started to drain 1 week ago. She has been cleaning area with peroxide and alcohol without relief. She denies fatigue, fever, general malaise, nausea, vomiting, or diarrhea.  Past Medical History:  Diagnosis Date  . Depression   . Diabetes mellitus type 1 (HCC)   . HIV (human immunodeficiency virus infection) (HCC) 2011  . Hypertension    Social History   Social History  . Marital status: Single    Spouse name: N/A  . Number of children: N/A  . Years of education: N/A   Occupational History  . Not on file.   Social History Main Topics  . Smoking status: Current Every Day Smoker    Packs/day: 0.25    Types: Cigarettes  . Smokeless tobacco: Never Used     Comment: trying to slow  . Alcohol use 0.6 oz/week    1 Standard drinks or equivalent per week     Comment: socially  . Drug use: No  . Sexual activity: Yes    Birth control/ protection: Surgical   Other Topics Concern  . Not on file   Social History Narrative  . No narrative on file   Immunization History  Administered Date(s) Administered  . Hepatitis A, Adult 01/28/2010, 03/11/2010  . Hepatitis B 01/28/2010, 03/11/2010  . Influenza,inj,Quad PF,6+ Mos 09/16/2015  . PPD Test 01/11/2013  . Pneumococcal Conjugate-13 01/03/2016  . Pneumococcal Polysaccharide-23 01/28/2010, 10/20/2012  . Tdap 01/11/2013  Review of Systems  Constitutional: Negative for diaphoresis, fever and weight loss.  HENT: Negative.   Eyes: Negative.   Respiratory: Negative.   Cardiovascular: Negative.   Gastrointestinal: Negative.   Genitourinary: Negative.   Musculoskeletal: Negative.   Skin:   Draining abscess to mid chest  Neurological: Negative for weakness.  Physical Exam  Constitutional: She is well-developed, well-nourished, and in no distress.  Cardiovascular: Normal rate, regular rhythm and normal heart sounds.   Pulmonary/Chest: Effort normal and breath sounds normal.  Abdominal: Soft. Bowel sounds are normal.  Neurological: She is alert. Gait normal.  Skin:     Keloid abscess. Abscess, no induration, moderate tan drainage, tender to touch.  1 X 2.5 cm   Plan   1. Abscess of chest (HCC) Keloid abscess to midchest.with moderate tan drainage, wound culture obtained. Cleaned with normal saline Applied bacitracin, covered with 2X2 and applied micropore tape.  - sulfamethoxazole-trimethoprim (BACTRIM DS,SEPTRA DS) 800-160 MG tablet; Take 1 tablet by mouth 2 (two) times daily.  Dispense: 20 tablet; Refill: 0 - WOUND CULTURE - CBC with Differential  2. Antibiotic-induced yeast infection Emerie typically gets yeast infection with antibiotic use.  - fluconazole (DIFLUCAN) 150 MG tablet; Take 1 tablet (150 mg total) by mouth once.  Dispense: 2 tablet; Refill: 0  3. Keloid scar Ms. Birr warrants a referral to dermatology for further evaluation of keloidosis.  - Ambulatory referral to Dermatology   RTC: Follow up as previously scheduled for keloidosis   Nolon Nations  MSN, FNP-C Patient [facility] [address] [place], [place] [address] [phone]

## 2017-07-25 NOTE — Progress Notes (Signed)
Reviewed wound culture results, streptococcus agalactiae present. Will discontinue bactrim 800-160 mg twice daily. Will add the following regimen:   Meds ordered this encounter  Medications  . amoxicillin (AMOXIL) 500 MG capsule    Sig: Take 1 capsule (500 mg total) by mouth 3 (three) times daily.    Dispense:  30 capsule    Refill:  0    Brain Honeycutt Moore Shatiqua Heroux  MSN, FNP-C Patient [facility] [address] [place], [place] [address] [phone]

## 2017-07-26 ENCOUNTER — Telehealth: Payer: Self-pay

## 2017-07-26 NOTE — Telephone Encounter (Signed)
-----   Message from Massie Maroon, [place] sent at 07/25/2017  6:53 PM EDT ----- Regarding: lab results Please inform patient that wound culture yielded a strep species. Will discontinue bactrim. Will add Amoxicillin 500 mg three times daily for 10 days. Please stress the importance of completing antibiotic therapy. Also, take with food to prevent GI upset.   Thanks

## 2017-07-26 NOTE — Telephone Encounter (Signed)
Called and spoke with patient, advised that her antibiotic needed to be changed to treat the bacteria that was growing on her culture. Advised to stop bactrim and to start amoxicillin  1 tablet three times daily for 10 days. Advised to complete all 10 days and take with food. Asked that she keeps her next appointment to follow up. Patient verbalized understanding. Thanks!

## 2017-07-28 ENCOUNTER — Other Ambulatory Visit: Payer: Medicaid Other

## 2017-07-28 DIAGNOSIS — B20 Human immunodeficiency virus [HIV] disease: Secondary | ICD-10-CM

## 2017-07-28 LAB — COMPLETE METABOLIC PANEL WITH GFR
AG Ratio: 0.8 (calc) — ABNORMAL LOW (ref 1.0–2.5)
ALBUMIN MSPROF: 3.2 g/dL — AB (ref 3.6–5.1)
ALKALINE PHOSPHATASE (APISO): 67 U/L (ref 33–115)
ALT: 11 U/L (ref 6–29)
AST: 8 U/L — AB (ref 10–30)
BUN / CREAT RATIO: 12 (calc) (ref 6–22)
BUN: 17 mg/dL (ref 7–25)
CALCIUM: 8.8 mg/dL (ref 8.6–10.2)
CHLORIDE: 104 mmol/L (ref 98–110)
CO2: 24 mmol/L (ref 20–32)
Creat: 1.45 mg/dL — ABNORMAL HIGH (ref 0.50–1.10)
GFR, Est African American: 53 mL/min/{1.73_m2} — ABNORMAL LOW (ref >=60)
GFR, Est Non African American: 46 mL/min/{1.73_m2} — ABNORMAL LOW (ref >=60)
GLOBULIN: 4 g/dL — AB (ref 1.9–3.7)
GLUCOSE: 303 mg/dL — AB (ref 65–99)
Potassium: 4.7 mmol/L (ref 3.5–5.3)
SODIUM: 135 mmol/L (ref 135–146)
Total Bilirubin: 0.3 mg/dL (ref 0.2–1.2)
Total Protein: 7.2 g/dL (ref 6.1–8.1)

## 2017-07-28 LAB — CBC WITH DIFFERENTIAL/PLATELET
Basophils Absolute: 72 cells/uL (ref 0–200)
Basophils Relative: 1.1 %
EOS PCT: 4.1 %
Eosinophils Absolute: 267 cells/uL (ref 15–500)
HCT: 37.9 % (ref 35.0–45.0)
Hemoglobin: 13 g/dL (ref 11.7–15.5)
Lymphs Abs: 2373 cells/uL (ref 850–3900)
MCH: 31.9 pg (ref 27.0–33.0)
MCHC: 34.3 g/dL (ref 32.0–36.0)
MCV: 92.9 fL (ref 80.0–100.0)
MPV: 11.5 fL (ref 7.5–12.5)
Monocytes Relative: 4.9 %
NEUTROS PCT: 53.4 %
Neutro Abs: 3471 cells/uL (ref 1500–7800)
PLATELETS: 201 10*3/uL (ref 140–400)
RBC: 4.08 10*6/uL (ref 3.80–5.10)
RDW: 12.7 % (ref 11.0–15.0)
TOTAL LYMPHOCYTE: 36.5 %
WBC mixed population: 319 cells/uL (ref 200–950)
WBC: 6.5 10*3/uL (ref 3.8–10.8)

## 2017-07-29 LAB — T-HELPER CELL (CD4) - (RCID CLINIC ONLY)
CD4 T CELL ABS: 510 /uL (ref 400–2700)
CD4 T CELL HELPER: 21 % — AB (ref 33–55)

## 2017-07-30 LAB — HIV-1 RNA QUANT-NO REFLEX-BLD
HIV 1 RNA Quant: 66 copies/mL — ABNORMAL HIGH
HIV-1 RNA Quant, Log: 1.82 Log copies/mL — ABNORMAL HIGH

## 2017-08-02 ENCOUNTER — Encounter: Payer: Self-pay | Admitting: Family Medicine

## 2017-08-02 ENCOUNTER — Ambulatory Visit (INDEPENDENT_AMBULATORY_CARE_PROVIDER_SITE_OTHER): Payer: Medicaid Other | Admitting: Family Medicine

## 2017-08-02 VITALS — BP 148/78 | HR 97 | Temp 98.4°F | Resp 18 | Ht 60.0 in | Wt 277.0 lb

## 2017-08-02 DIAGNOSIS — L02213 Cutaneous abscess of chest wall: Secondary | ICD-10-CM

## 2017-08-02 NOTE — Patient Instructions (Signed)
Please complete antibiotic as prescribed.  Will follow up with dermatology as scheduled.

## 2017-08-02 NOTE — Progress Notes (Signed)
  Sherry Hall, a 36 year old patient with a history of HIV, DMII, hypertension, and keloids presents for a follow up of a draining abscess to mid chest. She also has a history of multiple keloids. Keloids have been removed in the past, but returned over time. She says that mid chest keloid has been progressively getting larger. She noticed that area started to drain and was treated and evaluated 2 weeks ago. A wound culture was sent that yielded streptococcus agalactiae. Patient was started on Amoxicillin 500 mg TID for 10 days. She says that abscess has improved.  She denies fatigue, fever, general malaise, nausea, vomiting, or diarrhlea.  Past Medical History:  Diagnosis Date  . Depression   . Diabetes mellitus type 1 (HCC)   . HIV (human immunodeficiency virus infection) (HCC) 2011  . Hypertension    Social History   Social History  . Marital status: Single    Spouse name: N/A  . Number of children: N/A  . Years of education: N/A   Occupational History  . Not on file.   Social History Main Topics  . Smoking status: Current Every Day Smoker    Packs/day: 0.25    Types: Cigarettes  . Smokeless tobacco: Never Used     Comment: trying to slow  . Alcohol use 0.6 oz/week    1 Standard drinks or equivalent per week     Comment: socially  . Drug use: No  . Sexual activity: Yes    Birth control/ protection: Surgical   Other Topics Concern  . Not on file   Social History Narrative  . No narrative on file   Immunization History  Administered Date(s) Administered  . Hepatitis A, Adult 01/28/2010, 03/11/2010  . Hepatitis B 01/28/2010, 03/11/2010  . Influenza,inj,Quad PF,6+ Mos 09/16/2015  . PPD Test 01/11/2013  . Pneumococcal Conjugate-13 01/03/2016  . Pneumococcal Polysaccharide-23 01/28/2010, 10/20/2012  . Tdap 01/11/2013  Review of Systems  Constitutional: Negative for diaphoresis, fever and weight loss.  HENT: Negative.   Eyes: Negative.   Respiratory: Negative.     Cardiovascular: Negative.   Musculoskeletal: Negative.   Skin:        abscess to mid chest  Neurological: Negative for weakness.  Physical Exam  Constitutional: She is well-developed, well-nourished, and in no distress.  Cardiovascular: Normal rate, regular rhythm and normal heart sounds.   Pulmonary/Chest: Effort normal and breath sounds normal.  Abdominal: Soft. Bowel sounds are normal.  Neurological: She is alert. Gait normal.  Skin:     Keloid abscess. Abscess, no induration, non draining and non tender   Plan   Abscess of chest wall Abscess has improved on Amoxcillin Complete antibiotic Continue to apply warm moist compresses Follow up in dermatology for keloids as scheduled   RTC: As previously scheduled for chronic conditions    Irean Kendricks Rennis Petty  MSN, FNP-C Patient Care [facility] [address] [place], [place] [address] [phone]

## 2017-08-04 ENCOUNTER — Ambulatory Visit: Payer: Medicaid Other | Admitting: Infectious Diseases

## 2017-08-12 ENCOUNTER — Other Ambulatory Visit: Payer: Self-pay | Admitting: Infectious Diseases

## 2017-08-12 DIAGNOSIS — E10610 Type 1 diabetes mellitus with diabetic neuropathic arthropathy: Secondary | ICD-10-CM

## 2017-08-19 ENCOUNTER — Telehealth: Payer: Self-pay

## 2017-08-20 ENCOUNTER — Encounter: Payer: Self-pay | Admitting: Family Medicine

## 2017-08-20 ENCOUNTER — Ambulatory Visit (INDEPENDENT_AMBULATORY_CARE_PROVIDER_SITE_OTHER): Payer: Medicaid Other | Admitting: Family Medicine

## 2017-08-20 VITALS — BP 147/79 | HR 89 | Temp 98.3°F | Resp 16 | Ht 60.0 in | Wt 276.0 lb

## 2017-08-20 DIAGNOSIS — R0789 Other chest pain: Secondary | ICD-10-CM | POA: Diagnosis not present

## 2017-08-20 DIAGNOSIS — L02213 Cutaneous abscess of chest wall: Secondary | ICD-10-CM

## 2017-08-20 MED ORDER — ACETAMINOPHEN-CODEINE #3 300-30 MG PO TABS: 1.0000 | ORAL_TABLET | Freq: Four times a day (QID) | ORAL | 0 refills | Status: DC | PRN

## 2017-08-20 MED ORDER — AMOXICILLIN-POT CLAVULANATE 875-125 MG PO TABS: 1.0000 | ORAL_TABLET | Freq: Two times a day (BID) | ORAL | 0 refills | Status: AC

## 2017-08-20 NOTE — Progress Notes (Signed)
Sherry Hall, a 36 year old patient with a history of HIV, DMII, hypertension, and keloids presents for worsening abscess to mid chest. She also has a history of multiple keloids. Keloids have been removed in the past, but returned over time. She says that mid chest keloid has been progressively getting larger. She noticed that area started to drain and was treated and evaluated 3 weeks ago. A wound culture was sent that yielded streptococcus agalactiae. Patient was started on Amoxicillin 500 mg TID for 10 days.  She endorses increased pain to mid chest.  She denies fatigue, fever, general malaise, nausea, vomiting, or diarrhlea.  Past Medical History:  Diagnosis Date  . Depression   . Diabetes mellitus type 1 (HCC)   . HIV (human immunodeficiency virus infection) (HCC) 2011  . Hypertension    Social History   Social History  . Marital status: Single    Spouse name: N/A  . Number of children: N/A  . Years of education: N/A   Occupational History  . Not on file.   Social History Main Topics  . Smoking status: Current Every Day Smoker    Packs/day: 0.25    Types: Cigarettes  . Smokeless tobacco: Never Used     Comment: trying to slow  . Alcohol use 0.6 oz/week    1 Standard drinks or equivalent per week     Comment: socially  . Drug use: No  . Sexual activity: Yes    Birth control/ protection: Surgical   Other Topics Concern  . Not on file   Social History Narrative  . No narrative on file   Immunization History  Administered Date(s) Administered  . Hepatitis A, Adult 01/28/2010, 03/11/2010  . Hepatitis B 01/28/2010, 03/11/2010  . Influenza,inj,Quad PF,6+ Mos 09/16/2015  . PPD Test 01/11/2013  . Pneumococcal Conjugate-13 01/03/2016  . Pneumococcal Polysaccharide-23 01/28/2010, 10/20/2012  . Tdap 01/11/2013  Review of Systems  Constitutional: Negative for diaphoresis, fever and weight loss.  HENT: Negative.   Eyes: Negative.   Respiratory: Negative.     Cardiovascular: Negative.   Musculoskeletal: Negative.   Skin:        abscess to mid chest  Neurological: Negative for weakness.  Physical Exam  Constitutional: She is well-developed, well-nourished, and in no distress.  Cardiovascular: Normal rate, regular rhythm and normal heart sounds.   Pulmonary/Chest: Effort normal and breath sounds normal.  Abdominal: Soft. Bowel sounds are normal.  Neurological: She is alert. Gait normal.  Skin:     Keloid abscess. Abscess, no induration, non draining and non tender   Plan   Abscess of chest wall  No patient has a keloid abscess to mid chest. We will start a trial of Augmentin every 12 hours for 10 days. A referral has been sent to dermatology, they have attempted to reach patient she will give them a call on today. Recommend the patient apply warm moist compresses, use interchangeably with cool compresses - amoxicillin-clavulanate (AUGMENTIN) 875-125 MG tablet; Take 1 tablet by mouth 2 (two) times daily.  Dispense: 20 tablet; Refill: 0 - acetaminophen-codeine (TYLENOL #3) 300-30 MG tablet; Take 1 tablet by mouth every 6 (six) hours as needed for moderate pain.  Dispense: 15 tablet; Refill: 0  2. Chest wall pain - acetaminophen-codeine (TYLENOL #3) 300-30 MG tablet; Take 1 tablet by mouth every 6 (six) hours as needed for moderate pain.  Dispense: 15 tablet; Refill: 0 RTC: As previously scheduled for chronic conditions    [name] [name] [name]  MSN, FNP-C  Patient [facility] [address] [place], [place] [address] [phone]

## 2017-08-20 NOTE — Patient Instructions (Signed)
Please follow-up with dermatology as previously scheduled. We will start Augmentin 875 through 125 mg every 12 hours for keloid abscess to mid chest. Apply warm moist compresses to chest and use interchangeably with cool compresses as needed Start Tylenol No. 3 take 1 tablet by mouth every 6 hours as needed for moderate-to-severe pain.  Please follow-up in office as previously scheduled

## 2017-08-20 NOTE — Telephone Encounter (Signed)
Called, no answer. Left a message for patient to call back. Thanks!  

## 2017-08-30 ENCOUNTER — Other Ambulatory Visit: Payer: Self-pay | Admitting: Family Medicine

## 2017-08-30 ENCOUNTER — Telehealth: Payer: Self-pay

## 2017-08-30 DIAGNOSIS — T3695XA Adverse effect of unspecified systemic antibiotic, initial encounter: Principal | ICD-10-CM

## 2017-08-30 DIAGNOSIS — B379 Candidiasis, unspecified: Secondary | ICD-10-CM

## 2017-08-30 MED ORDER — FLUCONAZOLE 150 MG PO TABS: 150.0000 mg | ORAL_TABLET | Freq: Once | ORAL | 1 refills | Status: AC

## 2017-08-30 NOTE — Progress Notes (Signed)
Spoke with patient. Advised that diflucan has been sent into pharmacy. Thanks!

## 2017-08-30 NOTE — Telephone Encounter (Signed)
[place], Please advise. Thanks!  

## 2017-09-01 ENCOUNTER — Other Ambulatory Visit: Payer: Self-pay

## 2017-09-01 MED ORDER — FLUCONAZOLE 150 MG PO TABS: 150.0000 mg | ORAL_TABLET | Freq: Once | ORAL | 0 refills | Status: AC

## 2017-09-01 NOTE — Telephone Encounter (Signed)
Sent in rx for diflucan. Thanks!

## 2017-09-02 ENCOUNTER — Other Ambulatory Visit: Payer: Self-pay | Admitting: Family Medicine

## 2017-09-07 ENCOUNTER — Other Ambulatory Visit: Payer: Self-pay | Admitting: Infectious Diseases

## 2017-09-07 ENCOUNTER — Telehealth: Payer: Self-pay | Admitting: *Deleted

## 2017-09-07 DIAGNOSIS — E10610 Type 1 diabetes mellitus with diabetic neuropathic arthropathy: Secondary | ICD-10-CM

## 2017-09-07 NOTE — Telephone Encounter (Signed)
Prescription refill received via escript. Patient overdue for a visit, was given 30 day supply last month with note to contact [facility] for an appointment. RN called patient, offered an appointment 11/26 8:45. She will need labs, refills. Patient reports ~35 pills Odefsey left in her current bottles, has not missed a dose. Andree CossHowell, Penelopi Mikrut M, RN

## 2017-09-13 ENCOUNTER — Ambulatory Visit: Payer: Medicaid Other | Admitting: Infectious Diseases

## 2017-09-15 ENCOUNTER — Ambulatory Visit: Payer: Medicaid Other | Admitting: Family Medicine

## 2017-09-23 ENCOUNTER — Encounter: Payer: Self-pay | Admitting: Family Medicine

## 2017-09-23 ENCOUNTER — Ambulatory Visit (INDEPENDENT_AMBULATORY_CARE_PROVIDER_SITE_OTHER): Payer: Medicaid Other | Admitting: Family Medicine

## 2017-09-23 VITALS — BP 140/88 | HR 110 | Temp 98.6°F | Resp 16 | Ht 60.0 in | Wt 278.0 lb

## 2017-09-23 DIAGNOSIS — E119 Type 2 diabetes mellitus without complications: Secondary | ICD-10-CM

## 2017-09-23 DIAGNOSIS — K219 Gastro-esophageal reflux disease without esophagitis: Secondary | ICD-10-CM | POA: Diagnosis not present

## 2017-09-23 DIAGNOSIS — E1165 Type 2 diabetes mellitus with hyperglycemia: Secondary | ICD-10-CM | POA: Diagnosis not present

## 2017-09-23 DIAGNOSIS — F411 Generalized anxiety disorder: Secondary | ICD-10-CM | POA: Diagnosis not present

## 2017-09-23 DIAGNOSIS — IMO0001 Reserved for inherently not codable concepts without codable children: Secondary | ICD-10-CM

## 2017-09-23 DIAGNOSIS — Z794 Long term (current) use of insulin: Secondary | ICD-10-CM

## 2017-09-23 DIAGNOSIS — I10 Essential (primary) hypertension: Secondary | ICD-10-CM | POA: Diagnosis not present

## 2017-09-23 LAB — GLUCOSE, CAPILLARY: Glucose-Capillary: 338 mg/dL — ABNORMAL HIGH (ref 65–99)

## 2017-09-23 LAB — POCT URINALYSIS DIP (DEVICE)
Bilirubin Urine: NEGATIVE
GLUCOSE, UA: 500 mg/dL — AB
Ketones, ur: NEGATIVE mg/dL
LEUKOCYTES UA: NEGATIVE
NITRITE: NEGATIVE
PROTEIN: 30 mg/dL — AB
Specific Gravity, Urine: 1.015 (ref 1.005–1.030)
UROBILINOGEN UA: 0.2 mg/dL (ref 0.0–1.0)
pH: 6 (ref 5.0–8.0)

## 2017-09-23 LAB — POCT GLYCOSYLATED HEMOGLOBIN (HGB A1C): HEMOGLOBIN A1C: 11

## 2017-09-23 MED ORDER — SITAGLIPTIN PHOS-METFORMIN HCL 50-1000 MG PO TABS: ORAL_TABLET | ORAL | 2 refills | Status: DC

## 2017-09-23 MED ORDER — INSULIN ASPART 100 UNIT/ML FLEXPEN: PEN_INJECTOR | SUBCUTANEOUS | 11 refills | Status: DC

## 2017-09-23 MED ORDER — BUSPIRONE HCL 15 MG PO TABS: ORAL_TABLET | ORAL | 3 refills | Status: DC

## 2017-09-23 MED ORDER — LISINOPRIL-HYDROCHLOROTHIAZIDE 20-25 MG PO TABS: 1.0000 | ORAL_TABLET | Freq: Every day | ORAL | 3 refills | Status: DC

## 2017-09-23 MED ORDER — INSULIN GLARGINE 100 UNIT/ML SOLOSTAR PEN: 30.0000 [IU] | PEN_INJECTOR | Freq: Every day | SUBCUTANEOUS | 3 refills | Status: DC

## 2017-09-23 MED ORDER — RANITIDINE HCL 300 MG PO TABS: 300.0000 mg | ORAL_TABLET | Freq: Every day | ORAL | 5 refills | Status: DC

## 2017-09-23 NOTE — Patient Instructions (Addendum)
Your hemoglobin A1c is increased at 11.  Will increase Lantus to 30 units at bedtime.  Continue sliding scale insulin for meal coverage.  Recommend that you set your phone to remember to take sliding scale insulin and bedtime basal insulin as discussed. Recommend a carbohydrate modify diet divided over small meals throughout the day.  Your blood pressure is controlled on current medication regimen, no changes warranted on today.    Your A1C goal is less than 7. Your fasting blood sugar  Upon awakening goal is between 110-140.  Your LDL  (bad cholesterol goal is less than 100 Blood pressure goal is <140/90.  Recommend a lowfat, low carbohydrate diet divided over 5-6 small meals, increase water intake to 6-8 glasses, and 150 minutes per week of cardiovascular exercise.   Take your medications as prescribed Make sure that you are familiar with each one of your medications and what they are used to treat.  If you are unsure of medications, please bring to follow up Will send referral for eye exam  Please keep your scheduled follow up appointment.

## 2017-09-24 LAB — CMP AND LIVER
ALK PHOS: 85 IU/L (ref 39–117)
ALT: 12 IU/L (ref 0–32)
AST: 7 IU/L (ref 0–40)
Albumin: 3.6 g/dL (ref 3.5–5.5)
BILIRUBIN TOTAL: 0.3 mg/dL (ref 0.0–1.2)
BILIRUBIN, DIRECT: 0.09 mg/dL (ref 0.00–0.40)
BUN: 10 mg/dL (ref 6–20)
CHLORIDE: 98 mmol/L (ref 96–106)
CO2: 23 mmol/L (ref 20–29)
Calcium: 8.8 mg/dL (ref 8.7–10.2)
Creatinine, Ser: 0.8 mg/dL (ref 0.57–1.00)
GFR calc Af Amer: 108 mL/min/{1.73_m2} (ref >59)
GFR calc non Af Amer: 94 mL/min/{1.73_m2} (ref >59)
Glucose: 279 mg/dL — ABNORMAL HIGH (ref 65–99)
Potassium: 4.1 mmol/L (ref 3.5–5.2)
Sodium: 134 mmol/L (ref 134–144)
Total Protein: 7.5 g/dL (ref 6.0–8.5)

## 2017-09-24 LAB — CBC WITH DIFFERENTIAL/PLATELET
BASOS ABS: 0 10*3/uL (ref 0.0–0.2)
Basos: 1 %
EOS (ABSOLUTE): 0.2 10*3/uL (ref 0.0–0.4)
Eos: 2 %
Hematocrit: 37.5 % (ref 34.0–46.6)
Hemoglobin: 13 g/dL (ref 11.1–15.9)
IMMATURE GRANS (ABS): 0 10*3/uL (ref 0.0–0.1)
IMMATURE GRANULOCYTES: 0 %
LYMPHS: 32 %
Lymphocytes Absolute: 2.3 10*3/uL (ref 0.7–3.1)
MCH: 32 pg (ref 26.6–33.0)
MCHC: 34.7 g/dL (ref 31.5–35.7)
MCV: 92 fL (ref 79–97)
Monocytes Absolute: 0.3 10*3/uL (ref 0.1–0.9)
Monocytes: 4 %
NEUTROS ABS: 4.4 10*3/uL (ref 1.4–7.0)
NEUTROS PCT: 61 %
PLATELETS: 218 10*3/uL (ref 150–379)
RBC: 4.06 x10E6/uL (ref 3.77–5.28)
RDW: 13.9 % (ref 12.3–15.4)
WBC: 7.2 10*3/uL (ref 3.4–10.8)

## 2017-09-27 NOTE — Progress Notes (Signed)
Subjective:    Patient ID: Sherry Hall, female    DOB: Nov 13, 1980, 36 y.o.   MRN: [id number]  HPI Sherry Hall, a 36 year old female with a history of DMII, hypertension, HIV, and depression presents for a follow-up of chronic conditions.  Sherry Hall has  type 2 diabetes mellitus. She states that she was diagnosed at age 36. She is currently on Janumet BID and Lantus. She says that she has not been taking medication or check blood sugars consistently. She states that she has been unable to get out of bed due to depression and has not been exercising routinely.   She reports fatigue and polyuria.  Patient denies foot ulcerations, hyperglycemia, paresthesia of the feet, polydipsia, visual disturbances, vomitting and weight loss.   She also has a history of hypertension.  She is not exercising and is not adherent to low salt diet.  Patient is not currently checking blood sugars at home.  Patient denies chest pain, dyspnea, irregular heart beat, syncope and tachypnea.  Cardiovascular risk factors include: she is an everyday tobacco smoker, morbid obesity, and sedentary lifestyle.   Ms. [name] complains of depression and anxiety. She states that she has been experiencing symptoms that have increased over the past several weeks due to the tragic loss of her son. She says that depression worsens around the holidays. She is under the care of [facility], she was last evaluated 2 days ago.   She says that counseling has not helped. She says that she is unable to speak her son's name without becoming upset.  She has the following symptoms: anhedonia,  difficulty concentrating, feelings of losing control, insomnia, irritable, racing thoughts.  Symptoms occur intermittently. She denies current suicidal and homicidal ideation.   Past Medical History:  Diagnosis Date  . Depression   . Diabetes mellitus type 1 (HCC)   . HIV (human immunodeficiency virus infection) (HCC)  2011  . Hypertension    Immunization History  Administered Date(s) Administered  . Hepatitis A, Adult 01/28/2010, 03/11/2010  . Hepatitis B 01/28/2010, 03/11/2010  . Influenza,inj,Quad PF,6+ Mos 09/16/2015  . PPD Test 01/11/2013  . Pneumococcal Conjugate-13 01/03/2016  . Pneumococcal Polysaccharide-23 01/28/2010, 10/20/2012  . Tdap 01/11/2013   Social History   Socioeconomic History  . Marital status: Single    Spouse name: Not on file  . Number of children: Not on file  . Years of education: Not on file  . Highest education level: Not on file  Social Needs  . Financial resource strain: Not on file  . Food insecurity - worry: Not on file  . Food insecurity - inability: Not on file  . Transportation needs - medical: Not on file  . Transportation needs - non-medical: Not on file  Occupational History  . Not on file  Tobacco Use  . Smoking status: Current Every Day Smoker    Packs/day: 0.25    Types: Cigarettes  . Smokeless tobacco: Never Used  . Tobacco comment: trying to slow  Substance and Sexual Activity  . Alcohol use: Yes    Alcohol/week: 0.6 oz    Types: 1 Standard drinks or equivalent per week    Comment: socially  . Drug use: No  . Sexual activity: Yes    Birth control/protection: Surgical  Other Topics Concern  . Not on file  Social History Narrative  . Not on file   Review of Systems  Constitutional: Positive for fatigue and unexpected weight change.  HENT: Negative.  Eyes: Negative.  Negative for photophobia and visual disturbance.  Cardiovascular: Negative.  Negative for chest pain, palpitations and leg swelling.  Gastrointestinal: Negative.  Negative for constipation and diarrhea.  Endocrine: Positive for polyuria. Negative for polydipsia and polyphagia.  Genitourinary: Negative.   Skin: Negative.   Allergic/Immunologic: Positive for immunocompromised state (HIV+).  Neurological: Negative.  Negative for dizziness.  Hematological: Negative.    Psychiatric/Behavioral: Positive for decreased concentration and sleep disturbance. The patient is nervous/anxious.        Patient tearful       Objective:   Physical Exam  Constitutional: She is oriented to person, place, and time. She appears well-developed and well-nourished.  Morbid obesity  HENT:  Head: Normocephalic and atraumatic.  Right Ear: External ear normal.  Left Ear: External ear normal.  Nose: Nose normal.  Mouth/Throat: Oropharynx is clear and moist.  Eyes: EOM are normal. Pupils are equal, round, and reactive to light.  Neck: Normal range of motion. Neck supple.  Cardiovascular: Normal rate, regular rhythm, normal heart sounds and intact distal pulses.  Pulmonary/Chest: Effort normal and breath sounds normal. No apnea.  Abdominal: Soft. Bowel sounds are normal.  Increased abdominal girth  Musculoskeletal: Normal range of motion.  Neurological: She is alert and oriented to person, place, and time. She has normal reflexes.  Skin: Skin is warm and dry.  Psychiatric: Her behavior is normal. Judgment and thought content normal. Her mood appears anxious. She exhibits a depressed mood (Tearful). She expresses no suicidal ideation. She expresses no suicidal plans.     BP 140/88 (BP Location: Right Arm, Patient Position: Sitting, Cuff Size: Large)   Pulse (!) 110   Temp 98.6 F (37 C) (Oral)   Resp 16   Ht 5' (1.524 m)   Wt 278 lb (126.1 kg)   LMP 09/01/2017   SpO2 100%   BMI 54.29 kg/m  Assessment & Plan:  1. Uncontrolled type 2 diabetes mellitus without complication, with long-term current use of insulin (HCC) Hemoglobin a1C is 11.0, which is above goal. There have been some probable compliance issues here. I have discussed with her the great importance of following the treatment plan exactly as directed in order to achieve a good medical outcome.  - Glucose, capillary - POCT glycosylated hemoglobin (Hb A1C) - insulin aspart (NOVOLOG) 100 UNIT/ML FlexPen; Per  sliding scale  Dispense: 15 mL; Refill: 11 - Insulin Glargine (LANTUS SOLOSTAR) 100 UNIT/ML Solostar Pen; Inject 30 Units into the skin daily at 10 pm.  Dispense: 15 mL; Refill: 3 - CMP and Liver - CBC with Differential - sitaGLIPtin-metformin (JANUMET) 50-1000 MG tablet; TAKE 1 TABLET BY MOUTH TWO (TWO) TIMES DAILY WITH A MEAL.  Dispense: 60 tablet; Refill: 2 - POCT urinalysis dip (device)  2. Essential hypertension - lisinopril-hydrochlorothiazide (PRINZIDE,ZESTORETIC) 20-25 MG tablet; Take 1 tablet by mouth daily.  Dispense: 30 tablet; Refill: 3 - POCT urinalysis dip (device)  3. GERD without esophagitis - ranitidine (ZANTAC) 300 MG tablet; Take 1 tablet (300 mg total) by mouth at bedtime.  Dispense: 30 tablet; Refill: 5  4. Generalized anxiety disorder Discussed symptoms of anxiety and depression at length. Also, discussed the importance of attending counseling sessions especially during the holiday season. She expressed understanding. She is very tearful during appointment. She currently denies suicidal or homicidal intent.  - busPIRone (BUSPAR) 15 MG tablet; TAKE 1 TABLET (15 MG TOTAL) BY MOUTH TWO (TWO) TIMES DAILY.  Dispense: 60 tablet; Refill: 3    RTC: 3 months for chronic  conditions  Nolon Nations  MSN, FNP-C Patient Care [facility] [address] [place], [place] [address] [phone]

## 2017-10-20 ENCOUNTER — Other Ambulatory Visit: Payer: Self-pay | Admitting: Family Medicine

## 2017-10-20 DIAGNOSIS — E1165 Type 2 diabetes mellitus with hyperglycemia: Principal | ICD-10-CM

## 2017-10-20 DIAGNOSIS — Z794 Long term (current) use of insulin: Principal | ICD-10-CM

## 2017-10-20 DIAGNOSIS — I10 Essential (primary) hypertension: Secondary | ICD-10-CM

## 2017-10-20 DIAGNOSIS — IMO0001 Reserved for inherently not codable concepts without codable children: Secondary | ICD-10-CM

## 2017-10-21 ENCOUNTER — Ambulatory Visit: Payer: Medicaid Other | Admitting: Family Medicine

## 2017-10-22 ENCOUNTER — Telehealth: Payer: Self-pay

## 2017-10-22 ENCOUNTER — Other Ambulatory Visit: Payer: Self-pay | Admitting: Family Medicine

## 2017-10-22 DIAGNOSIS — E1165 Type 2 diabetes mellitus with hyperglycemia: Principal | ICD-10-CM

## 2017-10-22 DIAGNOSIS — Z794 Long term (current) use of insulin: Principal | ICD-10-CM

## 2017-10-22 DIAGNOSIS — IMO0001 Reserved for inherently not codable concepts without codable children: Secondary | ICD-10-CM

## 2017-10-22 NOTE — Telephone Encounter (Signed)
Already sent to pharmacy on 10/22/2017

## 2017-10-27 ENCOUNTER — Ambulatory Visit: Payer: Medicaid Other | Admitting: Infectious Diseases

## 2017-11-09 ENCOUNTER — Ambulatory Visit: Payer: Medicaid Other | Admitting: Family Medicine

## 2017-11-12 ENCOUNTER — Encounter: Payer: Self-pay | Admitting: Family Medicine

## 2017-11-12 ENCOUNTER — Ambulatory Visit (INDEPENDENT_AMBULATORY_CARE_PROVIDER_SITE_OTHER): Payer: Medicaid Other | Admitting: Family Medicine

## 2017-11-12 ENCOUNTER — Ambulatory Visit (HOSPITAL_COMMUNITY)
Admission: RE | Admit: 2017-11-12 | Discharge: 2017-11-12 | Disposition: A | Discharge status: Home / Self Care | Payer: Medicaid Other | Source: Ambulatory Visit | Attending: Family Medicine | Admitting: Family Medicine

## 2017-11-12 VITALS — BP 135/78 | HR 101 | Temp 97.8°F | Resp 16 | Ht 60.0 in | Wt 274.0 lb

## 2017-11-12 DIAGNOSIS — B20 Human immunodeficiency virus [HIV] disease: Secondary | ICD-10-CM

## 2017-11-12 DIAGNOSIS — I10 Essential (primary) hypertension: Secondary | ICD-10-CM | POA: Diagnosis not present

## 2017-11-12 DIAGNOSIS — M25461 Effusion, right knee: Secondary | ICD-10-CM

## 2017-11-12 DIAGNOSIS — K0889 Other specified disorders of teeth and supporting structures: Secondary | ICD-10-CM

## 2017-11-12 DIAGNOSIS — N898 Other specified noninflammatory disorders of vagina: Secondary | ICD-10-CM | POA: Diagnosis not present

## 2017-11-12 DIAGNOSIS — R0789 Other chest pain: Secondary | ICD-10-CM

## 2017-11-12 DIAGNOSIS — E118 Type 2 diabetes mellitus with unspecified complications: Secondary | ICD-10-CM

## 2017-11-12 DIAGNOSIS — R220 Localized swelling, mass and lump, head: Secondary | ICD-10-CM

## 2017-11-12 DIAGNOSIS — M25561 Pain in right knee: Secondary | ICD-10-CM | POA: Diagnosis not present

## 2017-11-12 DIAGNOSIS — W19XXXA Unspecified fall, initial encounter: Secondary | ICD-10-CM

## 2017-11-12 DIAGNOSIS — L02213 Cutaneous abscess of chest wall: Secondary | ICD-10-CM | POA: Diagnosis not present

## 2017-11-12 DIAGNOSIS — Z794 Long term (current) use of insulin: Secondary | ICD-10-CM

## 2017-11-12 LAB — POCT URINALYSIS DIP (DEVICE)
Bilirubin Urine: NEGATIVE
GLUCOSE, UA: 500 mg/dL — AB
KETONES UR: NEGATIVE mg/dL
Nitrite: NEGATIVE
Protein, ur: 100 mg/dL — AB
SPECIFIC GRAVITY, URINE: 1.02 (ref 1.005–1.030)
UROBILINOGEN UA: 0.2 mg/dL (ref 0.0–1.0)
pH: 6 (ref 5.0–8.0)

## 2017-11-12 MED ORDER — FLUCONAZOLE 150 MG PO TABS: 150.0000 mg | ORAL_TABLET | Freq: Once | ORAL | 0 refills | Status: AC

## 2017-11-12 MED ORDER — KETOROLAC TROMETHAMINE 60 MG/2ML IM SOLN
60.0000 mg | Freq: Once | INTRAMUSCULAR | Status: AC
  Administered 2017-11-12: 60 mg via INTRAMUSCULAR

## 2017-11-12 MED ORDER — CLINDAMYCIN HCL 150 MG PO CAPS: 150.0000 mg | ORAL_CAPSULE | Freq: Three times a day (TID) | ORAL | 0 refills | Status: DC

## 2017-11-12 MED ORDER — ACETAMINOPHEN-CODEINE #3 300-30 MG PO TABS: 1.0000 | ORAL_TABLET | Freq: Four times a day (QID) | ORAL | 0 refills | Status: DC | PRN

## 2017-11-12 NOTE — Patient Instructions (Addendum)
For abscess to chest wall, will send a referral to general surgery For right dental abscess, will start a trial of Clindamycin three times daily for 7 days Also follow up with dentist as scheduled  Will review xray of right knee and follow up by phone after reviewing xray results. You received Toradol 60 mg IV for knee pain without complication. Discussed OTC Ibuprofen and renal toxicity at length, please refrain from taking. Recommend Tylenol 500 mg every 6 hours as needed.  Knee Pain, Adult Many things can cause knee pain. The pain often goes away on its own with time and rest. If the pain does not go away, tests may be done to find out what is causing the pain. Follow these instructions at home: Activity  Rest your knee.  Do not do things that cause pain.  Avoid activities where both feet leave the ground at the same time (high-impact activities). Examples are running, jumping rope, and doing jumping jacks. General instructions  Take medicines only as told by your doctor.  Raise (elevate) your knee when you are resting. Make sure your knee is higher than your heart.  Sleep with a pillow under your knee.  If told, put ice on the knee: ? Put ice in a plastic bag. ? Place a towel between your skin and the bag. ? Leave the ice on for 20 minutes, 2-3 times a day.  Ask your doctor if you should wear an elastic knee support.  Lose weight if you are overweight. Being overweight can make your knee hurt more.  Do not use any tobacco products. These include cigarettes, chewing tobacco, or electronic cigarettes. If you need help quitting, ask your doctor. Smoking may slow down healing. Contact a doctor if:  The pain does not stop.  The pain changes or gets worse.  You have a fever along with knee pain.  Your knee gives out or locks up.  Your knee swells, and becomes worse. Get help right away if:  Your knee feels warm.  You cannot move your knee.  You have very bad knee  pain.  You have chest pain.  You have trouble breathing. Summary  Many things can cause knee pain. The pain often goes away on its own with time and rest.  Avoid activities that put stress on your knee. These include running and jumping rope.  Get help right away if you cannot move your knee, or if your knee feels warm, or if you have trouble breathing. This information is not intended to replace advice given to you by your health care provider. Make sure you discuss any questions you have with your health care provider. Document Released: 01/01/2009 Document Revised: 09/29/2016 Document Reviewed: 09/29/2016 Elsevier Interactive Patient Education  2017 Elsevier Inc.  Skin Abscess A skin abscess is an infected area on or under your skin that contains pus and other material. An abscess can happen almost anywhere on your body. Some abscesses break open (rupture) on their own. Most continue to get worse unless they are treated. The infection can spread deeper into the body and into your blood, which can make you feel sick. Treatment usually involves draining the abscess. Follow these instructions at home: Abscess Care  If you have an abscess that has not drained, place a warm, clean, wet washcloth over the abscess several times a day. Do this as told by your doctor.  Follow instructions from your doctor about how to take care of your abscess. Make sure you: ?  Cover the abscess with a bandage (dressing). ? Change your bandage or gauze as told by your doctor. ? Wash your hands with soap and water before you change the bandage or gauze. If you cannot use soap and water, use hand sanitizer.  Check your abscess every day for signs that the infection is getting worse. Check for: ? More redness, swelling, or pain. ? More fluid or blood. ? Warmth. ? More pus or a bad smell. Medicines   Take over-the-counter and prescription medicines only as told by your doctor.  If you were prescribed an  antibiotic medicine, take it as told by your doctor. Do not stop taking the antibiotic even if you start to feel better. General instructions  To avoid spreading the infection: ? Do not share personal care items, towels, or hot tubs with others. ? Avoid making skin-to-skin contact with other people.  Keep all follow-up visits as told by your doctor. This is important. Contact a doctor if:  You have more redness, swelling, or pain around your abscess.  You have more fluid or blood coming from your abscess.  Your abscess feels warm when you touch it.  You have more pus or a bad smell coming from your abscess.  You have a fever.  Your muscles ache.  You have chills.  You feel sick. Get help right away if:  You have very bad (severe) pain.  You see red streaks on your skin spreading away from the abscess. This information is not intended to replace advice given to you by your health care provider. Make sure you discuss any questions you have with your health care provider. Document Released: 03/23/2008 Document Revised: 05/31/2016 Document Reviewed: 08/14/2015 Elsevier Interactive Patient Education  [employer]2018 Else[employer] Inc.

## 2017-11-13 LAB — BASIC METABOLIC PANEL
BUN / CREAT RATIO: 18 (ref 9–23)
BUN: 17 mg/dL (ref 6–20)
CO2: 23 mmol/L (ref 20–29)
Calcium: 9.9 mg/dL (ref 8.7–10.2)
Chloride: 99 mmol/L (ref 96–106)
Creatinine, Ser: 0.97 mg/dL (ref 0.57–1.00)
GFR calc non Af Amer: 74 mL/min/{1.73_m2} (ref >59)
GFR, EST AFRICAN AMERICAN: 86 mL/min/{1.73_m2} (ref >59)
Glucose: 249 mg/dL — ABNORMAL HIGH (ref 65–99)
POTASSIUM: 4.4 mmol/L (ref 3.5–5.2)
Sodium: 137 mmol/L (ref 134–144)

## 2017-11-13 LAB — CBC WITH DIFFERENTIAL/PLATELET
Basophils Absolute: 0 10*3/uL (ref 0.0–0.2)
Basos: 1 %
EOS (ABSOLUTE): 0.3 10*3/uL (ref 0.0–0.4)
Eos: 4 %
Hematocrit: 40 % (ref 34.0–46.6)
Hemoglobin: 13.7 g/dL (ref 11.1–15.9)
IMMATURE GRANULOCYTES: 0 %
Immature Grans (Abs): 0 10*3/uL (ref 0.0–0.1)
Lymphocytes Absolute: 2.5 10*3/uL (ref 0.7–3.1)
Lymphs: 36 %
MCH: 31.7 pg (ref 26.6–33.0)
MCHC: 34.3 g/dL (ref 31.5–35.7)
MCV: 93 fL (ref 79–97)
MONOS ABS: 0.3 10*3/uL (ref 0.1–0.9)
Monocytes: 4 %
NEUTROS PCT: 55 %
Neutrophils Absolute: 4 10*3/uL (ref 1.4–7.0)
PLATELETS: 230 10*3/uL (ref 150–379)
RBC: 4.32 x10E6/uL (ref 3.77–5.28)
RDW: 13.5 % (ref 12.3–15.4)
WBC: 7.2 10*3/uL (ref 3.4–10.8)

## 2017-11-16 ENCOUNTER — Telehealth: Payer: Self-pay

## 2017-11-16 NOTE — Telephone Encounter (Signed)
-----   Message from Massie MaroonLachina M Hollis, [place]NP sent at 11/14/2017  4:27 PM EST ----- Regarding: Lab results Please inform patient that all laboratory values are within normal range.  No medication changes warranted at this time.  Stressed the importance of taking medication consistently and following a carbohydrate modify diet.  Also, right knee x-ray showed no fracture or swelling.  Elevate right knee to heart level while at rest and apply ice therapy 20 minutes 4 times per day.  Follow-up in office as previously scheduled.  Thanks

## 2017-11-16 NOTE — Telephone Encounter (Signed)
Called and spoke with patient. Advised that labs are within normal range and no medications are needed at this time. Asked that patient please take her medications consistently and work on following a carbohydrate modified diet. Informed that right knee xray showed no fracture or swelling. Gave instructions to elevated right knee to heart level while resting and apply ice 20 minutes up to 4 times daily as needed. Patient verbalized understanding and was asked to keep next scheduled follow up. Thanks!

## 2017-11-16 NOTE — Progress Notes (Signed)
Sherry Hall, a 37 year old patient with a history of HIV, DMII, hypertension, and keloids presents for worsening abscess to mid chest and dental pain. She also has a history of multiple keloids. Keloids have been removed in the past, but returned over time. She says that mid chest keloid has been progressively getting larger.  Patient was evaluated by dermatology and was told there is not much that can be done at this time.  Patient states that when she went for evaluation, there was no drainage.  However, abscess has returned and has been draining over the past several days.   Patient is also complaining of dental pain primarily to right side over the past week.  Patient has not scheduled an appointment with dentist.  She states that if she is afraid that she will warrant an extraction.  She endorses swelling to right lower gums and right facial swelling.  She has been taking over-the-counter ibuprofen without sustained relief.  Current pain intensity is 6 out of 10 characterized as intermittent and throbbing.  Pain is worsening when eating hot or cold items.  She denies fatigue, fever, general malaise, nausea, vomiting, or diarrhea.  Patient is also complaining of increased pain to right knee.  Patient had a fall at home on yesterday.  She states that she was walking in her yard and lost her balance.  She landed on her right knee.  She has multiple abrasions on right knee.  She characterizes right knee pain as throbbing.  She did not receive an evaluation after fall.  Right knee pain is worsening by prolonged standing.  She has not attempted any over-the-counter interventions to alleviate symptoms. Past Medical History:  Diagnosis Date  . Depression   . Diabetes mellitus type 1 (HCC)   . HIV (human immunodeficiency virus infection) (HCC) 2011  . Hypertension    Social History   Socioeconomic History  . Marital status: Single    Spouse name: Not on file  . Number of children: Not on file    . Years of education: Not on file  . Highest education level: Not on file  Social Needs  . Financial resource strain: Not on file  . Food insecurity - worry: Not on file  . Food insecurity - inability: Not on file  . Transportation needs - medical: Not on file  . Transportation needs - non-medical: Not on file  Occupational History  . Not on file  Tobacco Use  . Smoking status: Current Every Day Smoker    Packs/day: 0.25    Types: Cigarettes  . Smokeless tobacco: Never Used  . Tobacco comment: trying to slow  Substance and Sexual Activity  . Alcohol use: Yes    Alcohol/week: 0.6 oz    Types: 1 Standard drinks or equivalent per week    Comment: socially  . Drug use: No  . Sexual activity: Yes    Birth control/protection: Surgical  Other Topics Concern  . Not on file  Social History Narrative  . Not on file   Immunization History  Administered Date(s) Administered  . Hep A / Hep B 01/28/2010, 03/11/2010  . Hepatitis A, Adult 01/28/2010, 03/11/2010  . Hepatitis B 01/28/2010, 03/11/2010  . Influenza,inj,Quad PF,6+ Mos 09/16/2015  . Influenza-Unspecified 11/16/2011, 10/20/2012  . PPD Test 01/28/2010, 11/16/2011, 01/11/2013  . Pneumococcal Conjugate-13 10/20/2012, 01/03/2016  . Pneumococcal Polysaccharide-23 02/04/2010, 10/20/2012  . Tdap 01/11/2013  Review of Systems  Constitutional: Negative for diaphoresis, fever and weight loss.  HENT: Negative.  Dental pain  Eyes: Negative.   Respiratory: Negative.   Cardiovascular: Negative.   Genitourinary: Negative.   Musculoskeletal: Positive for joint pain.       Right knee pain  Skin:        abscess to mid chest  Neurological: Negative.  Negative for weakness.  Psychiatric/Behavioral: Negative.   Physical Exam  Constitutional: She is well-developed, well-nourished, and in no distress.  Cardiovascular: Normal rate, regular rhythm and normal heart sounds.  Pulmonary/Chest: Effort normal and breath sounds normal.   Abdominal: Soft. Bowel sounds are normal.  Neurological: She is alert. Gait normal.  Skin:     Keloid abscess. Abscess, no induration, non draining and non tender  BP 135/78 (BP Location: Left Arm, Patient Position: Sitting, Cuff Size: Large)   Pulse (!) 101   Temp 97.8 F (36.6 C) (Oral)   Resp 16   Ht 5' (1.524 m)   Wt 274 lb (124.3 kg)   LMP 11/12/2017   SpO2 100%   BMI 53.51 kg/m  Plan    Cutaneous abscess of chest wall Patient warrants referral to general surgery for possible I&D of abscess to mid chest wall - Ambulatory referral to General Surgery - CBC with Differential   Vaginal itching Patient has a history of frequent vaginal yeast infections due to uncontrolled type 2 diabetes.  We will start Diflucan 150 mg x1.  Repeat in 1 week if symptoms remain. - fluconazole (DIFLUCAN) 150 MG tablet; Take 1 tablet (150 mg total) by mouth once for 1 dose.  Dispense: 2 tablet; Refill: 0 - Vaginitis/Vaginosis, DNA Probe  Essential hypertension Blood pressure is at goal on current medication regimen.  No medication changes are warranted at this time - Basic Metabolic Panel - POCT urinalysis dip (device)   HIV disease (HCC) Continue to take medications as prescribed and follow-up with infectious disease as scheduled  Type 2 diabetes mellitus with complication, with long-term current use of insulin (HCC) Patient reports that she often misses doses of antidiabetic medication.  Discussed the importance of following medication regimen consistently to achieve positive outcomes.  Will review renal functioning as results become available - Basic Metabolic Panel - POCT urinalysis dip (device)   Pain, dental Patient has swelling to right lower gums and right face.  Advised to apply cool compresses to right face as needed. Schedule dental appointment, first available. We will start a trial of clindamycin 150 mg 3 times per day for 7 days. - clindamycin (CLEOCIN) 150 MG capsule; Take 1  capsule (150 mg total) by mouth 3 (three) times daily.  Dispense: 21 capsule; Refill: 0  Swelling of gums - clindamycin (CLEOCIN) 150 MG capsule; Take 1 capsule (150 mg total) by mouth 3 (three) times daily.  Dispense: 21 capsule; Refill: 0 - CBC with Differential   Acute pain of right knee Patient sustained a fall landing on right knee.  Will review right knee x-ray and follow-up by phone.  Pain and swelling of right knee - ketorolac (TORADOL) injection 60 mg - DG Knee Complete 4 Views Right; Future   Fall, initial encounter - DG Knee Complete 4 Views Right; Future   Abscess of chest wall Referred to #1 - acetaminophen-codeine (TYLENOL #3) 300-30 MG tablet; Take 1 tablet by mouth every 6 (six) hours as needed for moderate pain.  Dispense: 15 tablet; Refill: 0 - ketorolac (TORADOL) injection 60 mg  Chest wall pain Referred to #1 - acetaminophen-codeine (TYLENOL #3) 300-30 MG tablet; Take 1 tablet by mouth every  6 (six) hours as needed for moderate pain.  Dispense: 15 tablet; Refill: 0 - ketorolac (TORADOL) injection 60 mg   Greater than 50% of appointment time spent face-to-face discussing chronic conditions and current complaints.    RTC: 3 months for chronic conditions    The patient was given clear instructions to go to ER or return to medical center if symptoms do not improve, worsen or new problems develop. The patient verbalized understanding.     N[place]on Nations  MSN, FNP-C Patient Care [facility] [address] [place], [place] [address] [phone]

## 2017-11-22 ENCOUNTER — Other Ambulatory Visit: Payer: Self-pay | Admitting: Family Medicine

## 2017-11-22 DIAGNOSIS — E1165 Type 2 diabetes mellitus with hyperglycemia: Principal | ICD-10-CM

## 2017-11-22 DIAGNOSIS — Z794 Long term (current) use of insulin: Principal | ICD-10-CM

## 2017-11-22 DIAGNOSIS — IMO0001 Reserved for inherently not codable concepts without codable children: Secondary | ICD-10-CM

## 2017-11-26 ENCOUNTER — Telehealth: Payer: Self-pay

## 2017-11-26 LAB — VAGINITIS/VAGINOSIS, DNA PROBE
CANDIDA SPECIES: NEGATIVE
Gardnerella vaginalis: POSITIVE — AB
Trichomonas vaginosis: NEGATIVE

## 2017-11-26 MED ORDER — METRONIDAZOLE 500 MG PO TABS: 500.0000 mg | ORAL_TABLET | Freq: Two times a day (BID) | ORAL | 0 refills | Status: DC

## 2017-11-26 NOTE — Telephone Encounter (Signed)
Contact patient to advise I am placing her on metronidazole 500 mg twice daily for bacterial vaginosis infection as the Diflucan will not clear the bacteria identified found in her recent vaginal specimen.  Godfrey PickKimberly S. Tiburcio PeaHarris, MSN, FNP-C The Patient Care [facility]  [address] [place], [place] [address] [phone]

## 2017-11-26 NOTE — Telephone Encounter (Signed)
[name],  Can you please look at last results and advise if this can be done? Thanks!

## 2017-11-26 NOTE — Telephone Encounter (Signed)
Called and spoke with patient, advised that she is being prescribed Metronidazole 500mg  twice daily for recent bacterial vaginosis and did not need diflucan. Advised if any problems to call back to our office. Thanks!

## 2017-11-29 ENCOUNTER — Other Ambulatory Visit: Payer: Self-pay | Admitting: Family Medicine

## 2017-11-29 ENCOUNTER — Ambulatory Visit (INDEPENDENT_AMBULATORY_CARE_PROVIDER_SITE_OTHER): Payer: Medicaid Other | Admitting: Family Medicine

## 2017-11-29 ENCOUNTER — Encounter: Payer: Self-pay | Admitting: Family Medicine

## 2017-11-29 VITALS — BP 138/68 | HR 94 | Temp 98.0°F | Resp 16 | Ht 60.0 in | Wt 278.0 lb

## 2017-11-29 DIAGNOSIS — N898 Other specified noninflammatory disorders of vagina: Secondary | ICD-10-CM

## 2017-11-29 DIAGNOSIS — N760 Acute vaginitis: Principal | ICD-10-CM

## 2017-11-29 DIAGNOSIS — B9689 Other specified bacterial agents as the cause of diseases classified elsewhere: Secondary | ICD-10-CM

## 2017-11-29 DIAGNOSIS — N949 Unspecified condition associated with female genital organs and menstrual cycle: Secondary | ICD-10-CM

## 2017-11-29 LAB — POCT URINALYSIS DIP (DEVICE)
Bilirubin Urine: NEGATIVE
GLUCOSE, UA: 500 mg/dL — AB
Ketones, ur: NEGATIVE mg/dL
LEUKOCYTES UA: NEGATIVE
Nitrite: NEGATIVE
PH: 6.5 (ref 5.0–8.0)
PROTEIN: 100 mg/dL — AB
SPECIFIC GRAVITY, URINE: 1.025 (ref 1.005–1.030)
Urobilinogen, UA: 0.2 mg/dL (ref 0.0–1.0)

## 2017-11-29 MED ORDER — METRONIDAZOLE 0.75 % VA GEL: 1.0000 | Freq: Every day | VAGINAL | 0 refills | Status: AC

## 2017-11-29 NOTE — Progress Notes (Signed)
Meds ordered this encounter  Medications  . metroNIDAZOLE (METROGEL) 0.75 % vaginal gel    Sig: Place 1 Applicatorful vaginally at bedtime for 7 days.    Dispense:  70 g    Refill:  0     Nolon NationsLachina Moore Lenice Koper  MSN, FNP-C Patient [facility] [address]  [place], [place] [address] [phone]

## 2017-11-29 NOTE — Progress Notes (Signed)
Sherry GougeBridget Hall,a 37 year old female with a history of HIV and uncontrolled diabetes mellitus. Patient was treated with clindamycin on 11/12/2017 for dental abscess and bacterial vaginitis. Patient called on 11/26/2017 complaining of vaginal itching. Provider on call, prescribed Metronidazole 500 mg every 12 hours. Spoke with patient this am, she has not started Metronidazole. Scheduled appointment for further testing and evaluation on today.   [name]  MSN, FNP-C Patient Care [facility] [address]  [place], [place] [address] [phone]

## 2017-11-30 LAB — HSV(HERPES SIMPLEX VRS) I + II AB-IGG
HSV 1 Glycoprotein G Ab, IgG: 19.6 index — ABNORMAL HIGH (ref 0.00–0.90)
HSV 2 IgG, Type Spec: 6.32 index — ABNORMAL HIGH (ref 0.00–0.90)

## 2017-11-30 NOTE — Patient Instructions (Signed)
Will follow up by phone after reviewing laboratory results.  Recommend sitz baths as needed to relieve vaginal irritation   How to Take a Sitz Bath A sitz bath is a warm water bath that is taken while you are sitting down. The water should only come up to your hips and should cover your buttocks. Your health care provider may recommend a sitz bath to help you:  Clean the lower part of your body, including your genital area.  With itching.  With pain.  With sore muscles or muscles that tighten or spasm.  How to take a sitz bath Take 3-4 sitz baths per day or as told by your health care provider. 1. Partially fill a bathtub with warm water. You will only need the water to be deep enough to cover your hips and buttocks when you are sitting in it. 2. If your health care provider told you to put medicine in the water, follow the directions exactly. 3. Sit in the water and open the tub drain a little. 4. Turn on the warm water again to keep the tub at the correct level. Keep the water running constantly. 5. Soak in the water for 15-20 minutes or as told by your health care provider. 6. After the sitz bath, pat the affected area dry first. Do not rub it. 7. Be careful when you stand up after the sitz bath because you may feel dizzy.  Contact a health care provider if:  Your symptoms get worse. Do not continue with sitz baths if your symptoms get worse.  You have new symptoms. Do not continue with sitz baths until you talk with your health care provider. This information is not intended to replace advice given to you by your health care provider. Make sure you discuss any questions you have with your health care provider. Document Released: 06/27/2004 Document Revised: 03/04/2016 Document Reviewed: 10/03/2014 Elsevier Interactive Patient Education  [employer]2018 [employer] Inc.

## 2017-11-30 NOTE — Progress Notes (Signed)
Chief Complaint  Patient presents with  . Vaginal Itching  . Vaginal Discharge      Sherry Hall is a 37 y.o. female with a history of HIV, hypertension, and uncontrolled diabetes type 2 who presents for evaluation of an abnormal vaginal discharge and vaginal itching. Patient has a history of uncontrolled diabetes mellitus and was treated for a dental abscess and bacterial vaginosis on 11/11/2017. Patient was treated with Clindamycin TID for 7 days. Patient typically gets yeast infections with antibiotic treatment, so she was prescribed difflucan. Patient took Difflucan at the onset of vaginal itching. She continues to experience vaginal itching and external irritation to vulva. Patient was prescribed Metronidazole 500 mg BID on 11/26/2017 by colleague for this issue, but did not start medication. She says that she has not been sexually active or changed partners recently. She has a normal menstrual cycle. She denies changing feminine products.  Patient endorses thick vaginal discharge and itching. She denies fever, fatigue, dizziness, nausea, vomiting, or diarrhea  Past Medical History:  Diagnosis Date  . Depression   . Diabetes mellitus type 1 (HCC)   . HIV (human immunodeficiency virus infection) (HCC) 2011  . Hypertension    Social History   Socioeconomic History  . Marital status: Single    Spouse name: Not on file  . Number of children: Not on file  . Years of education: Not on file  . Highest education level: Not on file  Social Needs  . Financial resource strain: Not on file  . Food insecurity - worry: Not on file  . Food insecurity - inability: Not on file  . Transportation needs - medical: Not on file  . Transportation needs - non-medical: Not on file  Occupational History  . Not on file  Tobacco Use  . Smoking status: Current Every Day Smoker    Packs/day: 0.25    Types: Cigarettes  . Smokeless tobacco: Never Used  . Tobacco comment: trying to slow  Substance and  Sexual Activity  . Alcohol use: Yes    Alcohol/week: 0.6 oz    Types: 1 Standard drinks or equivalent per week    Comment: socially  . Drug use: No  . Sexual activity: Yes    Birth control/protection: Surgical  Other Topics Concern  . Not on file  Social History Narrative  . Not on file   Immunization History  Administered Date(s) Administered  . Hep A / Hep B 01/28/2010, 03/11/2010  . Hepatitis A, Adult 01/28/2010, 03/11/2010  . Hepatitis B 01/28/2010, 03/11/2010  . Influenza,inj,Quad PF,6+ Mos 09/16/2015  . Influenza-Unspecified 11/16/2011, 10/20/2012  . PPD Test 01/28/2010, 11/16/2011, 01/11/2013  . Pneumococcal Conjugate-13 10/20/2012, 01/03/2016  . Pneumococcal Polysaccharide-23 02/04/2010, 10/20/2012  . Tdap 01/11/2013   Allergies  Allergen Reactions  . Latex Rash  . Percocet [Oxycodone-Acetaminophen] Hives, Itching and Rash   Review of Systems  Constitutional: Negative.  Negative for malaise/fatigue.  Eyes: Negative.   Respiratory: Negative.   Cardiovascular: Negative.   Gastrointestinal: Negative.   Genitourinary:       Vaginal itching and discharge  Musculoskeletal: Negative.   Skin: Positive for rash (vaginal folds).  Neurological: Negative.   Endo/Heme/Allergies: Negative.   Psychiatric/Behavioral: Negative.    Physical Exam  Constitutional: She appears well-developed and well-nourished.  Cardiovascular: Normal rate, regular rhythm and normal heart sounds.  Pulmonary/Chest: Effort normal and breath sounds normal.  Abdominal: Soft. Bowel sounds are normal.  Genitourinary: There is rash and lesion on the right labia. There is rash and  lesion on the left labia.  Genitourinary Comments: Erythematous, blistered, cropped, lesions to labia    BP 138/68 (BP Location: Left Arm, Patient Position: Sitting, Cuff Size: Large) Comment: manually  Pulse 94   Temp 98 F (36.7 C) (Oral)   Resp 16   Ht 5' (1.524 m)   Wt 278 lb (126.1 kg)   LMP 11/12/2017   SpO2  100%   BMI 54.29 kg/m  1. Genital lesion, female Will follow up by phone to discuss laboratory results and treatment plan. Recommend sitz baths as needed for genital irritation - POCT urinalysis dip (device) - HSV(herpes simplex vrs) 1+2 ab-IgG - GC/Chlamydia Probe Amp([facility])  2. Vaginal itching - POCT urinalysis dip (device) - HSV(herpes simplex vrs) 1+2 ab-IgG - GC/Chlamydia Probe Amp([facility])   RTC: Follow up as previously scheduled.   The patient was given clear instructions to go to ER or return to medical center if symptoms do not improve, worsen or new problems develop. The patient verbalized understanding. Will notify patient with laboratory results.   [place]on Nations  MSN, FNP-C Patient Care [facility] [address] [place], [place] [address] [phone]

## 2017-12-01 ENCOUNTER — Telehealth: Payer: Self-pay | Admitting: Family Medicine

## 2017-12-01 ENCOUNTER — Ambulatory Visit: Payer: Self-pay | Admitting: Surgery

## 2017-12-01 ENCOUNTER — Other Ambulatory Visit: Payer: Self-pay | Admitting: Family Medicine

## 2017-12-01 DIAGNOSIS — A6004 Herpesviral vulvovaginitis: Secondary | ICD-10-CM

## 2017-12-01 LAB — GC/CHLAMYDIA PROBE AMP
CHLAMYDIA, DNA PROBE: NEGATIVE
NEISSERIA GONORRHOEAE BY PCR: NEGATIVE

## 2017-12-01 MED ORDER — VALACYCLOVIR HCL 500 MG PO TABS: 500.0000 mg | ORAL_TABLET | Freq: Every day | ORAL | 2 refills | Status: DC

## 2017-12-01 NOTE — Progress Notes (Signed)
Meds ordered this encounter  Medications  . valACYclovir (VALTREX) 500 MG tablet    Sig: Take 1 tablet (500 mg total) by mouth daily.    Dispense:  30 tablet    Refill:  2    [place]on NationsLachina Moore Rayquon Uselman  MSN, FNP-C Patient Care [facility] [address]  [place], [place] [address] [phone]

## 2017-12-01 NOTE — Telephone Encounter (Signed)
Clarisse GougeBridget Raffety, a 37 year old female with a history of HIV and uncontrolled DMII presented on 11/29/2017 with vulvaginitis. Reviewed lab results, positive for HSV 2. Discussed genital herpes at length. Will suppress with Valtrex 500 mg daily. Patient to follow up in office as scheduled.  N[place]on NationsLachina Moore Giani Betzold  MSN, FNP-C Patient Care [facility] [address]  [place], [place] [address] [phone]

## 2017-12-15 ENCOUNTER — Other Ambulatory Visit: Payer: Self-pay

## 2017-12-22 ENCOUNTER — Ambulatory Visit: Payer: Medicaid Other | Admitting: Family Medicine

## 2017-12-22 ENCOUNTER — Encounter: Payer: Self-pay | Admitting: Surgery

## 2017-12-22 ENCOUNTER — Ambulatory Visit (INDEPENDENT_AMBULATORY_CARE_PROVIDER_SITE_OTHER): Payer: Medicare Other | Admitting: Surgery

## 2017-12-22 VITALS — BP 190/114 | HR 93 | Temp 98.2°F | Ht >= 80 in | Wt 282.0 lb

## 2017-12-22 DIAGNOSIS — L02213 Cutaneous abscess of chest wall: Secondary | ICD-10-CM

## 2017-12-22 MED ORDER — LORAZEPAM 0.5 MG PO TABS: 0.5000 mg | ORAL_TABLET | Freq: Three times a day (TID) | ORAL | 0 refills | Status: DC

## 2017-12-22 NOTE — H&P (View-Only) (Signed)
12/29/17  Addendum:  Patient called the office to cancel her office procedure.  She is very anxious about it and would like it to be done in the OR.  She'll be scheduled for 3/19.  Olean Ree, MD   12/22/2017  Reason for Visit:  Chest wall abscess  Referring Provider:  Cammie Sickle, FNP  History of Present Illness: Sherry Hall is a 37 y.o. female who presents with a several year history of a burn on her anterior chest wall skin while cooking.  She reports that particularly over the last 7 or so months, she has had issues with abscess formation and drainage at the site of the burn.  She abscess will grow in size, be tender, then drain.  She has been on prior antibiotic courses and reports that about every 3-4 weeks she gets a flare up.  She just recently finished a course of Clindamycin.    Denies any fevers, chills, chest pain, or shortness of breath.  The abscess is over the anterior chest wall, superficial, between the breasts.  Past Medical History: Past Medical History:  Diagnosis Date  . Depression   . Diabetes mellitus type 1 ([place])   . HIV (human immunodeficiency virus infection) ([place]) 2011  . Hypertension      Past Surgical History: Past Surgical History:  Procedure Laterality Date  . CESAREAN SECTION  2001, 2008, 2009, 2011  . CHOLECYSTECTOMY    . TONSILLECTOMY    . TUBAL LIGATION  2011    Home Medications: Prior to Admission medications   Medication Sig Start Date End Date Taking? Authorizing Provider  ACCU-CHEK AVIVA PLUS test strip USE AS INSTRUCTED ( 4 times a day per pt) 10/22/17  Yes Dorena Dew, FNP  ACCU-CHEK SOFTCLIX LANCETS lancets USE AS INSTRUCTED 10/21/17  Yes Dorena Dew, FNP  acetaminophen-codeine (TYLENOL #3) 300-30 MG tablet Take 1 tablet by mouth every 6 (six) hours as needed for moderate pain. 11/12/17  Yes Dorena Dew, FNP  ALPRAZolam Duanne Moron) 0.5 MG tablet Take 1 tablet (0.5 mg total) by mouth 3 (three) times daily as needed  for anxiety. 08/28/16  Yes Micheline Chapman, NP  atorvastatin (LIPITOR) 10 MG tablet TAKE 1 TABLET (10 MG TOTAL) BY MOUTH DAILY. 01/01/17  Yes Dorena Dew, FNP  Blood Glucose Monitoring Suppl (ACCU-CHEK AVIVA PLUS) w/Device KIT 1 each by Does not apply route 4 (four) times daily - after meals and at bedtime. 10/21/16  Yes Dorena Dew, FNP  buPROPion (WELLBUTRIN XL) 150 MG 24 hr tablet Take 150 mg by mouth daily.   Yes [provider]  busPIRone (BUSPAR) 15 MG tablet TAKE 1 TABLET (15 MG TOTAL) BY MOUTH TWO (TWO) TIMES DAILY. 09/23/17  Yes Dorena Dew, FNP  clindamycin (CLEOCIN) 150 MG capsule Take 1 capsule (150 mg total) by mouth 3 (three) times daily. 11/12/17  Yes Dorena Dew, FNP  FLUoxetine (PROZAC) 20 MG capsule Take 1 capsule by mouth 1 day or 1 dose. 07/26/13  Yes [provider]  GLOBAL EASE INJECT PEN NEEDLES 32G X 4 MM MISC USE AS DIRECTED TO ADMINISTER INSULIN THREE TIMES DAILY BEFORE MEALS 11/22/17  Yes Dorena Dew, FNP  glucose blood (ACCU-CHEK AVIVA) test strip Use as instructed 11/17/16  Yes Dorena Dew, FNP  ibuprofen (ADVIL,MOTRIN) 200 MG tablet Take 200 mg by mouth every 6 (six) hours as needed.   Yes [provider]  insulin aspart (NOVOLOG) 100 UNIT/ML FlexPen Per sliding scale 09/23/17  Yes Dorena Dew, FNP  Insulin Glargine (LANTUS SOLOSTAR) 100 UNIT/ML Solostar Pen Inject 30 Units into the skin daily at 10 pm. 09/23/17  Yes Dorena Dew, FNP  lisinopril-hydrochlorothiazide (PRINZIDE,ZESTORETIC) 20-25 MG tablet Take 1 tablet by mouth daily. 09/23/17  Yes Dorena Dew, FNP  metFORMIN (GLUCOPHAGE) 500 MG tablet Take 500 mg by mouth 2 (two) times daily. 07/26/13  Yes [provider]  ODEFSEY 200-25-25 MG TABS tablet TAKE 1 TABLET BY MOUTH DAILY WITH BREAKFAST. 08/12/17  Yes Campbell Riches, MD  ondansetron (ZOFRAN) 4 MG tablet Take 4 mg by mouth every 8 (eight) hours as needed for nausea or vomiting.    Yes [provider]  propranolol (INDERAL) 10 MG tablet Take 10 mg by mouth 2 (two) times daily.   Yes [provider]  ranitidine (ZANTAC) 300 MG tablet Take 1 tablet (300 mg total) by mouth at bedtime. 09/23/17  Yes Dorena Dew, FNP  sitaGLIPtin-metformin (JANUMET) 50-1000 MG tablet TAKE 1 TABLET BY MOUTH TWO (TWO) TIMES DAILY WITH A MEAL. 09/23/17  Yes Dorena Dew, FNP  traZODone (DESYREL) 50 MG tablet Take 50 mg by mouth at bedtime as needed for sleep. 10/28/16  Yes [provider]  valACYclovir (VALTREX) 500 MG tablet Take 1 tablet (500 mg total) by mouth daily. 12/01/17 12/31/17 Yes Dorena Dew, FNP  LORazepam (ATIVAN) 0.5 MG tablet Take 1 tablet (0.5 mg total) by mouth every 8 (eight) hours. Take 1 tablet 30 minutes prior to having procedure. 12/22/17   Olean Ree, MD    Allergies: Allergies  Allergen Reactions  . Latex Rash  . Percocet [Oxycodone-Acetaminophen] Hives, Itching and Rash    Social History:  reports that she has been smoking cigarettes.  She has been smoking about 0.25 packs per day. she has never used smokeless tobacco. She reports that she drinks about 0.6 oz of alcohol per week. She reports that she does not use drugs.   Family History: Family History  Problem Relation Age of Onset  . Diabetes Father   . Hypertension Father   . Cancer Mother        cervical     Review of Systems: Review of Systems  Constitutional: Negative for chills and fever.  HENT: Negative for hearing loss.   Respiratory: Negative for shortness of breath.   Cardiovascular: Negative for chest pain.  Gastrointestinal: Negative for abdominal pain, nausea and vomiting.  Genitourinary: Negative for dysuria.  Musculoskeletal: Negative for myalgias.  Skin: Positive for itching.       Recurrent chest wall abscess  Neurological: Negative for dizziness.  Psychiatric/Behavioral: Negative for depression. The patient is nervous/anxious.   All other  systems reviewed and are negative.   Physical Exam BP (!) 190/114   Pulse 93   Temp 98.2 F (36.8 C) (Oral)   Ht '7\' 5"'  (2.261 m)   Wt 127.9 kg (282 lb)   BMI 25.03 kg/m  CONSTITUTIONAL: No acute distress HEENT:  Normocephalic, atraumatic, extraocular motion intact. NECK: Trachea is midline, and there is no jugular venous distension. RESPIRATORY:  Lungs are clear, and breath sounds are equal bilaterally. Normal respiratory effort without pathologic use of accessory muscles. CARDIOVASCULAR: Heart is regular without murmurs, gallops, or rubs. GI: The abdomen is soft, obese, nondistended, nontender.  MUSCULOSKELETAL:  Normal muscle strength and tone in all four extremities.  No peripheral edema or cyanosis. SKIN:  Patient has a 2.5 cm x 1 cm superficial cyst in the middle of the anterior  chest wall, between the breasts.  There is no active drainage or tenderness.  It is not too deep as I can roll my finger under this area.  NEUROLOGIC:  Motor and sensation is grossly normal.  Cranial nerves are grossly intact. PSYCH:  Alert and oriented to person, place and time. Affect is normal.  Laboratory Analysis: No results found for this or any previous visit (from the past 24 hour(s)).  Imaging: No results found.  Assessment and Plan: This is a 37 y.o. female who presents with an abscess of the anterior chest wall.    Discussed with the patient that we can remove this in the office during an outpatient procedure.  Will schedule her for next week.  Discussed with her the risks of bleeding, infection, and injury to surrounding structures and she's willing to proceed.  She is very nervous and will get a prescription for one Ativan 0.5 mg tablet to take half hour prior to procedure.  She will call us if any issues or if the abscess flares up again so we can call in another prescription for clindamycin.  She's scheduled for Thursday 3/14 at 8 am.  Face-to-face time spent with the patient and care  providers was 40 minutes, with more than 50% of the time spent counseling, educating, and coordinating care of the patient.     Melvyn Neth, [place]

## 2017-12-22 NOTE — Progress Notes (Addendum)
12/29/17  Addendum:  Patient called the office to cancel her office procedure.  She is very anxious about it and would like it to be done in the OR.  She'll be scheduled for 3/19.  Olean Ree, MD   12/22/2017  Reason for Visit:  Chest wall abscess  Referring Provider:  Cammie Sickle, FNP  History of Present Illness: Sherry Hall is a 37 y.o. female who presents with a several year history of a burn on her anterior chest wall skin while cooking.  She reports that particularly over the last 7 or so months, she has had issues with abscess formation and drainage at the site of the burn.  She abscess will grow in size, be tender, then drain.  She has been on prior antibiotic courses and reports that about every 3-4 weeks she gets a flare up.  She just recently finished a course of Clindamycin.    Denies any fevers, chills, chest pain, or shortness of breath.  The abscess is over the anterior chest wall, superficial, between the breasts.  Past Medical History: Past Medical History:  Diagnosis Date  . Depression   . Diabetes mellitus type 1 ([place])   . HIV (human immunodeficiency virus infection) ([place]) 2011  . Hypertension      Past Surgical History: Past Surgical History:  Procedure Laterality Date  . CESAREAN SECTION  2001, 2008, 2009, 2011  . CHOLECYSTECTOMY    . TONSILLECTOMY    . TUBAL LIGATION  2011    Home Medications: Prior to Admission medications   Medication Sig Start Date End Date Taking? Authorizing Provider  ACCU-CHEK AVIVA PLUS test strip USE AS INSTRUCTED ( 4 times a day per pt) 10/22/17  Yes Dorena Dew, FNP  ACCU-CHEK SOFTCLIX LANCETS lancets USE AS INSTRUCTED 10/21/17  Yes Dorena Dew, FNP  acetaminophen-codeine (TYLENOL #3) 300-30 MG tablet Take 1 tablet by mouth every 6 (six) hours as needed for moderate pain. 11/12/17  Yes Dorena Dew, FNP  ALPRAZolam Duanne Moron) 0.5 MG tablet Take 1 tablet (0.5 mg total) by mouth 3 (three) times daily as needed  for anxiety. 08/28/16  Yes Micheline Chapman, NP  atorvastatin (LIPITOR) 10 MG tablet TAKE 1 TABLET (10 MG TOTAL) BY MOUTH DAILY. 01/01/17  Yes Dorena Dew, FNP  Blood Glucose Monitoring Suppl (ACCU-CHEK AVIVA PLUS) w/Device KIT 1 each by Does not apply route 4 (four) times daily - after meals and at bedtime. 10/21/16  Yes Dorena Dew, FNP  buPROPion (WELLBUTRIN XL) 150 MG 24 hr tablet Take 150 mg by mouth daily.   Yes [provider]  busPIRone (BUSPAR) 15 MG tablet TAKE 1 TABLET (15 MG TOTAL) BY MOUTH TWO (TWO) TIMES DAILY. 09/23/17  Yes Dorena Dew, FNP  clindamycin (CLEOCIN) 150 MG capsule Take 1 capsule (150 mg total) by mouth 3 (three) times daily. 11/12/17  Yes Dorena Dew, FNP  FLUoxetine (PROZAC) 20 MG capsule Take 1 capsule by mouth 1 day or 1 dose. 07/26/13  Yes [provider]  GLOBAL EASE INJECT PEN NEEDLES 32G X 4 MM MISC USE AS DIRECTED TO ADMINISTER INSULIN THREE TIMES DAILY BEFORE MEALS 11/22/17  Yes Dorena Dew, FNP  glucose blood (ACCU-CHEK AVIVA) test strip Use as instructed 11/17/16  Yes Dorena Dew, FNP  ibuprofen (ADVIL,MOTRIN) 200 MG tablet Take 200 mg by mouth every 6 (six) hours as needed.   Yes [provider]  insulin aspart (NOVOLOG) 100 UNIT/ML FlexPen Per sliding scale 09/23/17  Yes Dorena Dew, FNP  Insulin Glargine (LANTUS SOLOSTAR) 100 UNIT/ML Solostar Pen Inject 30 Units into the skin daily at 10 pm. 09/23/17  Yes Dorena Dew, FNP  lisinopril-hydrochlorothiazide (PRINZIDE,ZESTORETIC) 20-25 MG tablet Take 1 tablet by mouth daily. 09/23/17  Yes Dorena Dew, FNP  metFORMIN (GLUCOPHAGE) 500 MG tablet Take 500 mg by mouth 2 (two) times daily. 07/26/13  Yes [provider]  ODEFSEY 200-25-25 MG TABS tablet TAKE 1 TABLET BY MOUTH DAILY WITH BREAKFAST. 08/12/17  Yes Campbell Riches, MD  ondansetron (ZOFRAN) 4 MG tablet Take 4 mg by mouth every 8 (eight) hours as needed for nausea or vomiting.    Yes [provider]  propranolol (INDERAL) 10 MG tablet Take 10 mg by mouth 2 (two) times daily.   Yes [provider]  ranitidine (ZANTAC) 300 MG tablet Take 1 tablet (300 mg total) by mouth at bedtime. 09/23/17  Yes Dorena Dew, FNP  sitaGLIPtin-metformin (JANUMET) 50-1000 MG tablet TAKE 1 TABLET BY MOUTH TWO (TWO) TIMES DAILY WITH A MEAL. 09/23/17  Yes Dorena Dew, FNP  traZODone (DESYREL) 50 MG tablet Take 50 mg by mouth at bedtime as needed for sleep. 10/28/16  Yes [provider]  valACYclovir (VALTREX) 500 MG tablet Take 1 tablet (500 mg total) by mouth daily. 12/01/17 12/31/17 Yes Dorena Dew, FNP  LORazepam (ATIVAN) 0.5 MG tablet Take 1 tablet (0.5 mg total) by mouth every 8 (eight) hours. Take 1 tablet 30 minutes prior to having procedure. 12/22/17   Olean Ree, MD    Allergies: Allergies  Allergen Reactions  . Latex Rash  . Percocet [Oxycodone-Acetaminophen] Hives, Itching and Rash    Social History:  reports that she has been smoking cigarettes.  She has been smoking about 0.25 packs per day. she has never used smokeless tobacco. She reports that she drinks about 0.6 oz of alcohol per week. She reports that she does not use drugs.   Family History: Family History  Problem Relation Age of Onset  . Diabetes Father   . Hypertension Father   . Cancer Mother        cervical     Review of Systems: Review of Systems  Constitutional: Negative for chills and fever.  HENT: Negative for hearing loss.   Respiratory: Negative for shortness of breath.   Cardiovascular: Negative for chest pain.  Gastrointestinal: Negative for abdominal pain, nausea and vomiting.  Genitourinary: Negative for dysuria.  Musculoskeletal: Negative for myalgias.  Skin: Positive for itching.       Recurrent chest wall abscess  Neurological: Negative for dizziness.  Psychiatric/Behavioral: Negative for depression. The patient is nervous/anxious.   All other  systems reviewed and are negative.   Physical Exam BP (!) 190/114   Pulse 93   Temp 98.2 F (36.8 C) (Oral)   Ht '7\' 5"'  (2.261 m)   Wt 127.9 kg (282 lb)   BMI 25.03 kg/m  CONSTITUTIONAL: No acute distress HEENT:  Normocephalic, atraumatic, extraocular motion intact.  Patient has a 3 cm mass, what looks like a keloid over the right earlobe.  There is no erythema or induration and no drainage. NECK: Trachea is midline, and there is no jugular venous distension. RESPIRATORY:  Lungs are clear, and breath sounds are equal bilaterally. Normal respiratory effort without pathologic use of accessory muscles. CARDIOVASCULAR: Heart is regular without murmurs, gallops, or rubs. GI: The abdomen is soft, obese, nondistended, nontender.  MUSCULOSKELETAL:  Normal muscle strength and tone in all  four extremities.  No peripheral edema or cyanosis. SKIN:  Patient has a 2.5 cm x 1 cm superficial cyst in the middle of the anterior chest wall, between the breasts.  There is no active drainage or tenderness.  It is not too deep as I can roll my finger under this area.  NEUROLOGIC:  Motor and sensation is grossly normal.  Cranial nerves are grossly intact. PSYCH:  Alert and oriented to person, place and time. Affect is normal.  Laboratory Analysis: No results found for this or any previous visit (from the past 24 hour(s)).  Imaging: No results found.  Assessment and Plan: This is a 37 y.o. female who presents with an abscess of the anterior chest wall.    Discussed with the patient that we can remove this in the office during an outpatient procedure.  Will schedule her for next week.  Discussed with her the risks of bleeding, infection, and injury to surrounding structures and she's willing to proceed.  She is very nervous and will get a prescription for one Ativan 0.5 mg tablet to take half hour prior to procedure.  She will call us if any issues or if the abscess flares up again so we can call in another  prescription for clindamycin.  She's scheduled for Thursday 3/14 at 8 am.  She also asked about resecting the keloid that she has on her right earlobe.  Discussed with the patient that this is outside of my scope of practice given the location of the keloid and would be better addressed by a plastic surgeon.  Will send a referral to them.  Face-to-face time spent with the patient and care providers was 40 minutes, with more than 50% of the time spent counseling, educating, and coordinating care of the patient.     Melvyn Neth, [place]

## 2017-12-22 NOTE — Patient Instructions (Signed)
Please take the Ativan tablet 30 minutes prior to having the procedure done.   Please see your follow up appointment listed below.

## 2017-12-24 ENCOUNTER — Ambulatory Visit: Payer: Medicaid Other | Admitting: Family Medicine

## 2017-12-29 ENCOUNTER — Telehealth: Payer: Self-pay

## 2017-12-29 NOTE — Telephone Encounter (Signed)
Called patient and had to leave her a voicemail to return my call.   Patient's surgery is scheduled to be done on 01/04/2018 by Dr. Aleen CampiPiscoya. Patient needs to us back in order for us to give her directions on what she needs to do prior to her procedure.

## 2017-12-29 NOTE — Telephone Encounter (Signed)
Patient called and she stated that she wanted to cancel her procedure for tomorrow. She stated that she was not able to sleep last night due to having anxiety about the procedure. Patient stated that she wants to have it done in the OR. I told her that I would let Dr. Aleen CampiPiscoya know and that I will call her with his recommendations.

## 2017-12-29 NOTE — Addendum Note (Signed)
Addended by: Myrtie HawkPISCOYA, Jericha Bryden L on: 12/29/2017 02:07 PM   Modules accepted: Orders, SmartSet

## 2017-12-30 ENCOUNTER — Ambulatory Visit: Payer: Medicaid Other | Admitting: Surgery

## 2017-12-30 NOTE — Telephone Encounter (Signed)
Called and spoke with the patient to make sure that she had heard my message. She stated that she did not. Therefore, I told her that her surgery would be on 01/04/2018 with Dr[facility]. I also gave her the number to call the day before to know [facility]. I also told her that she needed to be NPO. Patient had no further questions.

## 2017-12-31 ENCOUNTER — Other Ambulatory Visit: Payer: Self-pay

## 2017-12-31 ENCOUNTER — Encounter
Admission: RE | Admit: 2017-12-31 | Discharge: 2017-12-31 | Disposition: A | Discharge status: Home / Self Care | Payer: Medicare Other | Source: Ambulatory Visit | Attending: Surgery | Admitting: Surgery

## 2017-12-31 ENCOUNTER — Telehealth: Payer: Self-pay | Admitting: Surgery

## 2017-12-31 HISTORY — DX: Gastro-esophageal reflux disease without esophagitis: K21.9

## 2017-12-31 HISTORY — DX: Anemia, unspecified: D64.9

## 2017-12-31 NOTE — Patient Instructions (Addendum)
Your procedure is scheduled on: 01-04-18 TUESDAY Report to Same Day Surgery 2nd floor medical mall [facility](Medical Mall Entrance-take elevator on left to 2nd floor.  Check in with surgery information desk.) To find out your arrival time please call [phone] between 1PM - 3PM on 01-03-18 MONDAY  Remember: Instructions that are not followed completely may result in serious medical risk, up to and including death, or upon the discretion of your surgeon and anesthesiologist your surgery may need to be rescheduled.    _x___ 1. Do not eat food after midnight the night before your procedure. NO GUM OR CANDY AFTER MIDNIGHT.  You may drink WATER up to 2 hours before you are scheduled to arrive at the hospital for your procedure.  Do not drink WATER within 2 hours of your scheduled arrival to the hospital.  Type 1 and type 2 diabetics should only drink water.     __x__ 2. No Alcohol for 24 hours before or after surgery.   __x__3. No Smoking or e-cigarettes for 24 prior to surgery.  Do not use any chewable tobacco products for at least 6 hour prior to surgery   ____  4. Bring all medications with you on the day of surgery if instructed.    __x__ 5. Notify your doctor if there is any change in your medical condition     (cold, fever, infections).    x___6. On the morning of surgery brush your teeth with toothpaste and water.  You may rinse your mouth with mouth wash if you wish.  Do not swallow any toothpaste or mouthwash.   Do not wear jewelry, make-up, hairpins, clips or nail polish.  Do not wear lotions, powders, or perfumes. You may wear deodorant.  Do not shave 48 hours prior to surgery. Men may shave face and neck.  Do not bring valuables to the hospital.    [facility] is not responsible for any belongings or valuables.               Contacts, dentures or bridgework may not be worn into surgery.  Leave your suitcase in the car. After surgery it may be brought to your room.  For patients  admitted to the hospital, discharge time is determined by your treatment team.  _  Patients discharged the day of surgery will not be allowed to drive home.  You will need someone to drive you home and stay with you the night of your procedure.    Please read over the following fact sheets that you were given:   [facility] Preparing for Surgery and or MRSA Information   _x___ TAKE THE FOLLOWING MEDICATION THE MORNING OF SURGERY WITH A SMALL SIP OF WATER. These include:  1. WELLBUTRIN  2. ZANTAC  3. PROPRANOLOL  4. ODEFSEY  5. BUSPAR  6.TAKE ATIVAN PRIOR TO COMING TO HOSPITAL AS INSTRUCTED BY DR Aleen CampiPISCOYA    ____Fleets enema or Magnesium Citrate as directed.   _x___ Use CHG Soap or sage wipes as directed on instruction sheet   ____ Use inhalers on the day of surgery and bring to hospital day of surgery  _X___ Stop Janumet 2 days prior to surgery-LAST DOSE ON Saturday, MARCH 16TH    _X___ Take 1/2 of usual insulin dose the night before surgery and none on the morning surgery-TAKE 15 UNITS OF LANTUS ON Monday NIGHT AND NO INSULIN AM OF SURGERY  ____ Follow recommendations from Cardiologist, Pulmonologist or PCP regarding  stopping Aspirin, Coumadin, Plavix ,Eliquis, Effient,  or Pradaxa, and Pletal.  X____Stop Anti-inflammatories such as Advil, Aleve, Ibuprofen, Motrin, Naproxen, Naprosyn, Goodies powders or aspirin products NOW-OK to take Tylenol    ____ Stop supplements until after surgery.     ____ Bring C-Pap to the hospital.

## 2017-12-31 NOTE — Telephone Encounter (Signed)
Pt advised of pre op date/time and sx date. Sx: 01/04/18 with Dr Velvet BathePiscoya--excision of chest wall abscess.  Pre op: 12/31/17 between 9-1:00pm--phone interview.   Patient made aware to call [phone], between 1-3:00pm the day before surgery, to find out what time to arrive.

## 2018-01-03 ENCOUNTER — Encounter
Admission: RE | Admit: 2018-01-03 | Discharge: 2018-01-03 | Disposition: A | Discharge status: Home / Self Care | Payer: Medicare Other | Source: Ambulatory Visit | Attending: Surgery | Admitting: Surgery

## 2018-01-03 DIAGNOSIS — Z01810 Encounter for preprocedural cardiovascular examination: Secondary | ICD-10-CM | POA: Diagnosis present

## 2018-01-03 DIAGNOSIS — Z01812 Encounter for preprocedural laboratory examination: Secondary | ICD-10-CM | POA: Insufficient documentation

## 2018-01-03 DIAGNOSIS — I10 Essential (primary) hypertension: Secondary | ICD-10-CM | POA: Diagnosis not present

## 2018-01-03 LAB — BASIC METABOLIC PANEL
Anion gap: 7 (ref 5–15)
BUN: 11 mg/dL (ref 6–20)
CO2: 22 mmol/L (ref 22–32)
CREATININE: 0.74 mg/dL (ref 0.44–1.00)
Calcium: 8.3 mg/dL — ABNORMAL LOW (ref 8.9–10.3)
Chloride: 100 mmol/L — ABNORMAL LOW (ref 101–111)
Glucose, Bld: 230 mg/dL — ABNORMAL HIGH (ref 65–99)
POTASSIUM: 3.5 mmol/L (ref 3.5–5.1)
SODIUM: 129 mmol/L — AB (ref 135–145)

## 2018-01-03 MED ORDER — DEXTROSE 5 % IV SOLN
3.0000 g | INTRAVENOUS | Status: AC
  Administered 2018-01-04: 3 g via INTRAVENOUS
  Filled 2018-01-03: qty 3000

## 2018-01-04 ENCOUNTER — Ambulatory Visit: Payer: Medicare Other | Admitting: Certified Registered"

## 2018-01-04 ENCOUNTER — Other Ambulatory Visit: Payer: Self-pay

## 2018-01-04 ENCOUNTER — Encounter
Admission: RE | Disposition: A | Discharge status: Home / Self Care | Payer: Self-pay | Source: Ambulatory Visit | Attending: Surgery

## 2018-01-04 ENCOUNTER — Ambulatory Visit
Admission: RE | Admit: 2018-01-04 | Discharge: 2018-01-04 | Disposition: A | Discharge status: Home / Self Care | Payer: Medicare Other | Source: Ambulatory Visit | Attending: Surgery | Admitting: Surgery

## 2018-01-04 DIAGNOSIS — F419 Anxiety disorder, unspecified: Secondary | ICD-10-CM | POA: Insufficient documentation

## 2018-01-04 DIAGNOSIS — B20 Human immunodeficiency virus [HIV] disease: Secondary | ICD-10-CM | POA: Diagnosis not present

## 2018-01-04 DIAGNOSIS — F329 Major depressive disorder, single episode, unspecified: Secondary | ICD-10-CM | POA: Diagnosis not present

## 2018-01-04 DIAGNOSIS — I10 Essential (primary) hypertension: Secondary | ICD-10-CM | POA: Diagnosis not present

## 2018-01-04 DIAGNOSIS — Z6841 Body Mass Index (BMI) 40.0 and over, adult: Secondary | ICD-10-CM | POA: Insufficient documentation

## 2018-01-04 DIAGNOSIS — Z794 Long term (current) use of insulin: Secondary | ICD-10-CM | POA: Diagnosis not present

## 2018-01-04 DIAGNOSIS — K219 Gastro-esophageal reflux disease without esophagitis: Secondary | ICD-10-CM | POA: Insufficient documentation

## 2018-01-04 DIAGNOSIS — F17210 Nicotine dependence, cigarettes, uncomplicated: Secondary | ICD-10-CM | POA: Insufficient documentation

## 2018-01-04 DIAGNOSIS — Z79899 Other long term (current) drug therapy: Secondary | ICD-10-CM | POA: Diagnosis not present

## 2018-01-04 DIAGNOSIS — E109 Type 1 diabetes mellitus without complications: Secondary | ICD-10-CM | POA: Diagnosis not present

## 2018-01-04 DIAGNOSIS — L02213 Cutaneous abscess of chest wall: Secondary | ICD-10-CM | POA: Diagnosis not present

## 2018-01-04 HISTORY — PX: EXCISION OF BREAST LESION: SHX6676

## 2018-01-04 HISTORY — DX: Anxiety disorder, unspecified: F41.9

## 2018-01-04 LAB — POCT PREGNANCY, URINE: Preg Test, Ur: NEGATIVE

## 2018-01-04 LAB — GLUCOSE, CAPILLARY
GLUCOSE-CAPILLARY: 214 mg/dL — AB (ref 65–99)
Glucose-Capillary: 211 mg/dL — ABNORMAL HIGH (ref 65–99)

## 2018-01-04 SURGERY — EXCISION OF BREAST LESION
Anesthesia: General | Wound class: Dirty or Infected

## 2018-01-04 MED ORDER — PROPOFOL 500 MG/50ML IV EMUL
INTRAVENOUS | Status: DC | PRN
  Administered 2018-01-04: 50 ug/kg/min via INTRAVENOUS

## 2018-01-04 MED ORDER — GABAPENTIN 300 MG PO CAPS
300.0000 mg | ORAL_CAPSULE | ORAL | Status: AC
  Administered 2018-01-04: 300 mg via ORAL

## 2018-01-04 MED ORDER — MIDAZOLAM HCL 5 MG/5ML IJ SOLN
INTRAMUSCULAR | Status: DC | PRN
  Administered 2018-01-04: 2 mg via INTRAVENOUS

## 2018-01-04 MED ORDER — BUPIVACAINE-EPINEPHRINE 0.5% -1:200000 IJ SOLN
INTRAMUSCULAR | Status: DC | PRN
  Administered 2018-01-04 (×2): 10 mL

## 2018-01-04 MED ORDER — ACETAMINOPHEN 500 MG PO TABS
1000.0000 mg | ORAL_TABLET | ORAL | Status: AC
  Administered 2018-01-04: 1000 mg via ORAL

## 2018-01-04 MED ORDER — HYDROCODONE-ACETAMINOPHEN 5-325 MG PO TABS: 1.0000 | ORAL_TABLET | Freq: Four times a day (QID) | ORAL | 0 refills | Status: DC | PRN

## 2018-01-04 MED ORDER — SODIUM CHLORIDE 0.9 % IV SOLN
INTRAVENOUS | Status: DC
  Administered 2018-01-04: 10:00:00 via INTRAVENOUS

## 2018-01-04 MED ORDER — FENTANYL CITRATE (PF) 100 MCG/2ML IJ SOLN
INTRAMUSCULAR | Status: AC
  Filled 2018-01-04: qty 2

## 2018-01-04 MED ORDER — ONDANSETRON HCL 4 MG/2ML IJ SOLN
INTRAMUSCULAR | Status: AC
  Filled 2018-01-04: qty 2

## 2018-01-04 MED ORDER — ACETAMINOPHEN 500 MG PO TABS
ORAL_TABLET | ORAL | Status: AC
  Administered 2018-01-04: 1000 mg via ORAL
  Filled 2018-01-04: qty 2

## 2018-01-04 MED ORDER — PROMETHAZINE HCL 25 MG/ML IJ SOLN: 6.2500 mg | INTRAMUSCULAR | Status: DC | PRN

## 2018-01-04 MED ORDER — PROPOFOL 10 MG/ML IV BOLUS
INTRAVENOUS | Status: AC
  Filled 2018-01-04: qty 20

## 2018-01-04 MED ORDER — FENTANYL CITRATE (PF) 100 MCG/2ML IJ SOLN
INTRAMUSCULAR | Status: DC | PRN
  Administered 2018-01-04: 50 ug via INTRAVENOUS
  Administered 2018-01-04 (×2): 25 ug via INTRAVENOUS

## 2018-01-04 MED ORDER — IBUPROFEN 600 MG PO TABS: 600.0000 mg | ORAL_TABLET | Freq: Three times a day (TID) | ORAL | 0 refills | Status: DC | PRN

## 2018-01-04 MED ORDER — CHLORHEXIDINE GLUCONATE CLOTH 2 % EX PADS
6.0000 | MEDICATED_PAD | Freq: Once | CUTANEOUS | Status: AC
  Administered 2018-01-04: 6 via TOPICAL

## 2018-01-04 MED ORDER — PROPOFOL 10 MG/ML IV BOLUS
INTRAVENOUS | Status: DC | PRN
  Administered 2018-01-04 (×2): 20 mg via INTRAVENOUS
  Administered 2018-01-04: 30 mg via INTRAVENOUS
  Administered 2018-01-04 (×3): 20 mg via INTRAVENOUS

## 2018-01-04 MED ORDER — ONDANSETRON HCL 4 MG/2ML IJ SOLN
INTRAMUSCULAR | Status: DC | PRN
  Administered 2018-01-04: 4 mg via INTRAVENOUS

## 2018-01-04 MED ORDER — MIDAZOLAM HCL 2 MG/2ML IJ SOLN
INTRAMUSCULAR | Status: AC
  Filled 2018-01-04: qty 2

## 2018-01-04 MED ORDER — BACITRACIN ZINC 500 UNIT/GM EX OINT
TOPICAL_OINTMENT | CUTANEOUS | Status: AC
  Filled 2018-01-04: qty 28.35

## 2018-01-04 MED ORDER — PHENYLEPHRINE HCL 10 MG/ML IJ SOLN
INTRAMUSCULAR | Status: DC | PRN
  Administered 2018-01-04 (×2): 50 ug via INTRAVENOUS

## 2018-01-04 MED ORDER — GABAPENTIN 300 MG PO CAPS
ORAL_CAPSULE | ORAL | Status: AC
  Administered 2018-01-04: 300 mg via ORAL
  Filled 2018-01-04: qty 1

## 2018-01-04 MED ORDER — FENTANYL CITRATE (PF) 100 MCG/2ML IJ SOLN: 25.0000 ug | INTRAMUSCULAR | Status: DC | PRN

## 2018-01-04 MED ORDER — PROPOFOL 10 MG/ML IV BOLUS
INTRAVENOUS | Status: AC
Start: 2018-01-04 — End: ?
  Filled 2018-01-04: qty 20

## 2018-01-04 SURGICAL SUPPLY — 37 items
BLADE SURG 15 STRL LF DISP TIS (BLADE) ×1 IMPLANT
BLADE SURG 15 STRL SS (BLADE) ×2
BRA SURGICAL 2XLRG (MISCELLANEOUS) ×3 IMPLANT
CANISTER SUCT 1200ML W/VALVE (MISCELLANEOUS) ×3 IMPLANT
CHLORAPREP W/TINT 26ML (MISCELLANEOUS) ×3 IMPLANT
CNTNR SPEC 2.5X3XGRAD LEK (MISCELLANEOUS) ×1
CONT SPEC 4OZ STER OR WHT (MISCELLANEOUS) ×2
CONTAINER SPEC 2.5X3XGRAD LEK (MISCELLANEOUS) ×1 IMPLANT
DRAIN PENROSE 1/4X12 LTX (DRAIN) IMPLANT
DRAPE CHEST BREAST 77X106 FENE (MISCELLANEOUS) ×3 IMPLANT
DRAPE LAPAROTOMY 100X77 ABD (DRAPES) IMPLANT
DRSG TEGADERM 4X4.75 (GAUZE/BANDAGES/DRESSINGS) ×3 IMPLANT
ELECT REM PT RETURN 9FT ADLT (ELECTROSURGICAL) ×3
ELECTRODE REM PT RTRN 9FT ADLT (ELECTROSURGICAL) ×1 IMPLANT
GAUZE SPONGE 4X4 12PLY STRL (GAUZE/BANDAGES/DRESSINGS) ×3 IMPLANT
GLOVE SURG SYN 7.0 (GLOVE) ×6 IMPLANT
GLOVE SURG SYN 7.5  E (GLOVE) ×2
GLOVE SURG SYN 7.5 E (GLOVE) ×1 IMPLANT
GOWN STRL REUS W/ TWL LRG LVL3 (GOWN DISPOSABLE) ×1 IMPLANT
GOWN STRL REUS W/TWL LRG LVL3 (GOWN DISPOSABLE) ×2
KIT TURNOVER KIT A (KITS) ×3 IMPLANT
LABEL OR SOLS (LABEL) IMPLANT
NEEDLE HYPO 22GX1.5 SAFETY (NEEDLE) ×3 IMPLANT
NS IRRIG 1000ML POUR BTL (IV SOLUTION) ×3 IMPLANT
PACK BASIN MINOR ARMC (MISCELLANEOUS) ×3 IMPLANT
SUT ETHILON 3-0 FS-10 30 BLK (SUTURE) ×3
SUT MNCRL 4-0 (SUTURE) ×2
SUT MNCRL 4-0 27XMFL (SUTURE) ×1
SUT SILK 3 0 SH 30 (SUTURE) IMPLANT
SUT VIC AB 0 SH 27 (SUTURE) ×3 IMPLANT
SUT VIC AB 3-0 SH 27 (SUTURE) ×2
SUT VIC AB 3-0 SH 27X BRD (SUTURE) ×1 IMPLANT
SUTURE EHLN 3-0 FS-10 30 BLK (SUTURE) ×1 IMPLANT
SUTURE MNCRL 4-0 27XMF (SUTURE) ×1 IMPLANT
SWAB CULTURE AMIES ANAERIB BLU (MISCELLANEOUS) IMPLANT
SYR 10ML LL (SYRINGE) ×3 IMPLANT
SYR BULB IRRIG 60ML STRL (SYRINGE) ×3 IMPLANT

## 2018-01-04 NOTE — Transfer of Care (Signed)
Immediate Anesthesia Transfer of Care Note  Patient: Sherry Hall  Procedure(s) Performed: EXCISION OF CHEST WALL ABSCESS. MIDDLE CHEST (N/A )  Patient Location: PACU  Anesthesia Type:MAC  Level of Consciousness: awake, alert  and oriented  Airway & Oxygen Therapy: Patient Spontanous Breathing  Post-op Assessment: Report given to RN and Post -op Vital signs reviewed and stable  Post vital signs: Reviewed and stable  Last Vitals:  Vitals:   01/04/18 1008 01/04/18 1251  BP: (!) 145/87 108/63  Pulse: 88 89  Resp: 18 (!) 9  Temp: (!) 35.8 C 36.7 C  SpO2: 100% 100%    Last Pain:  Vitals:   01/04/18 1251  TempSrc: Tympanic         Complications: No apparent anesthesia complications

## 2018-01-04 NOTE — Op Note (Signed)
  Procedure Date:  01/04/2018  Pre-operative Diagnosis:   Anterior chest wall cyst  Post-operative Diagnosis:  Anterior chest wall cyst  Procedure:  Excision of anterior chest wall cyst  Surgeon:  Howie IllJose Luis Wilmon Conover, MD  Anesthesia:  General endotracheal  Estimated Blood Loss:  5 ml  Specimens:  Chest wall cyst  Complications:  None  Indications for Procedure:  This is a 37 y.o. female who had been having intermittent drainage from a cyst on her anterior chest wall at the midline, that formed as a result of a burn blister.  It had required antibiotic treatment in the past as it had become infected.  She presents now for excision.  The risks of bleeding, abscess or infection, injury to surrounding structures, and need for further procedures were all discussed with the patient and was willing to proceed.  Description of Procedure: The patient was correctly identified in the preoperative area and brought into the operating room.  The patient was placed supine with VTE prophylaxis in place.  Appropriate time-outs were performed.  Anesthesia was induced and the patient was sedated.  Appropriate antibiotics were infused.  The patient's anterior chest was prepped and draped in usual sterile fashion.  Local anesthetic was infused intradermally.  A 6 cm elliptical incision was made over the cyst.  Cautery was used to dissect the cyst with surrounding skin and subcutaneous tissue en bloc and was sent to pathology.  Skin flaps were created on both sides of the wound in order to decrease the tension.  Hemostasis was achieved with cautery.  The cavity was irrigated thoroughly, and then the incision was closed in multiple layers, with 0 Vicryl at deep dermis 3-0 Vicryl at superficial dermis, and 3-0 Nylon for the skin in vertical mattress.  The wound was cleaned and then dressed with Bacitracin ointment, Telfa gauze, and Tegaderm.  A surgical bra was applied in order to decrease tension on the  incision.  The patient was emerged from anesthesia, and brought to the recovery room for further management.  The patient tolerated the procedure well and all counts were correct at the end of the case.   Howie IllJose Luis Phillips Goulette, MD

## 2018-01-04 NOTE — Discharge Instructions (Signed)

## 2018-01-04 NOTE — OR Nursing (Signed)
Spoke with Dr. Aleen CampiPiscoya 1418 re pt's current pharmacy is [facility], [address] [phone])  Advises he will e-script pt's med rx's to them.  Notified him that [facility], [address] [place] will cancel RX sent by him to them (per Adriene at pharmacy [phone]).

## 2018-01-04 NOTE — Anesthesia Preprocedure Evaluation (Signed)
Anesthesia Evaluation  Patient identified by MRN, date of birth, ID band Patient awake    Reviewed: Allergy & Precautions, H&P , NPO status , Patient's Chart, lab work & pertinent test results, reviewed documented beta blocker date and time   History of Anesthesia Complications Negative for: history of anesthetic complications  Airway Mallampati: IV  TM Distance: >3 FB Neck ROM: full    Dental  (+) Dental Advidsory Given, Partial Upper, Partial Lower, Missing   Pulmonary neg shortness of breath, neg sleep apnea, neg COPD, neg recent URI, Current Smoker,           Cardiovascular Exercise Tolerance: Good hypertension, (-) angina(-) CAD, (-) Past MI, (-) Cardiac Stents and (-) CABG (-) dysrhythmias (-) Valvular Problems/Murmurs     Neuro/Psych PSYCHIATRIC DISORDERS Anxiety Depression negative neurological ROS     GI/Hepatic Neg liver ROS, GERD  ,  Endo/Other  diabetes, Type 1, Insulin DependentMorbid obesity  Renal/GU negative Renal ROS  negative genitourinary   Musculoskeletal   Abdominal   Peds  Hematology negative hematology ROS (+) HIV,   Anesthesia Other Findings Past Medical History: No date: Anemia     Comment:  H/O No date: Anxiety No date: Depression No date: Diabetes mellitus type 1 (HCC) No date: GERD (gastroesophageal reflux disease) 2011: HIV (human immunodeficiency virus infection) (HCC) No date: Hypertension   Reproductive/Obstetrics negative OB ROS                             Anesthesia Physical Anesthesia Plan  ASA: III  Anesthesia Plan: General   Post-op Pain Management:    Induction: Intravenous  PONV Risk Score and Plan: 2 and Propofol infusion  Airway Management Planned: Natural Airway and Simple Face Mask  Additional Equipment:   Intra-op Plan:   Post-operative Plan:   Informed Consent: I have reviewed the patients History and Physical, chart, labs  and discussed the procedure including the risks, benefits and alternatives for the proposed anesthesia with the patient or authorized representative who has indicated his/her understanding and acceptance.   Dental Advisory Given  Plan Discussed with: Anesthesiologist, CRNA and Surgeon  Anesthesia Plan Comments:         Anesthesia Quick Evaluation

## 2018-01-04 NOTE — Interval H&P Note (Signed)
History and Physical Interval Note:  01/04/2018 11:25 AM  [name]  has presented today for surgery, with the diagnosis of anterior chest wall cyst / abscess  The various methods of treatment have been discussed with the patient and family. After consideration of risks, benefits and other options for treatment, the patient has consented to  Procedure(s) with comments: EXCISION OF CHEST WALL ABSCESS. MIDDLE CHEST (N/A) - resection of chest wall cyst / abscess as a surgical intervention .  The patient's history has been reviewed, patient examined, no change in status, stable for surgery.  I have reviewed the patient's chart and labs.  Questions were answered to the patient's satisfaction.     Sherry Hall

## 2018-01-04 NOTE — OR Nursing (Signed)
Unable to reach MD office to schedule appt before patient advised she needs to leave.  Pt advises she will call office to schedule 1 week f up appt for suture removal when she gets home.

## 2018-01-04 NOTE — Anesthesia Post-op Follow-up Note (Signed)
Anesthesia QCDR form completed.        

## 2018-01-05 ENCOUNTER — Encounter: Payer: Self-pay | Admitting: Surgery

## 2018-01-05 NOTE — Anesthesia Postprocedure Evaluation (Signed)
Anesthesia Post Note  Patient: Barney Siebert  Procedure(s) Performed: EXCISION OF CHEST WALL ABSCESS. MIDDLE CHEST (N/A )  Patient location during evaluation: PACU Anesthesia Type: General Level of consciousness: awake and alert Pain management: pain level controlled Vital Signs Assessment: post-procedure vital signs reviewed and stable Respiratory status: spontaneous breathing, nonlabored ventilation, respiratory function stable and patient connected to nasal cannula oxygen Cardiovascular status: blood pressure returned to baseline and stable Postop Assessment: no apparent nausea or vomiting Anesthetic complications: no     Last Vitals:  Vitals:   01/04/18 1335 01/04/18 1356  BP: (!) 142/68 (!) 157/86  Pulse: 73 72  Resp: 18 18  Temp: (!) 36.2 C (!) 36.2 C  SpO2: 100% 100%    Last Pain:  Vitals:   01/04/18 1356  TempSrc: Temporal  PainSc:                  Lenard SimmerAndrew Miarose Lippert

## 2018-01-06 LAB — SURGICAL PATHOLOGY

## 2018-01-12 ENCOUNTER — Encounter: Payer: Self-pay | Admitting: Surgery

## 2018-01-12 ENCOUNTER — Ambulatory Visit (INDEPENDENT_AMBULATORY_CARE_PROVIDER_SITE_OTHER): Payer: Medicaid Other | Admitting: Surgery

## 2018-01-12 VITALS — BP 182/108 | HR 105 | Temp 99.0°F | Ht 60.0 in | Wt 282.4 lb

## 2018-01-12 DIAGNOSIS — Z09 Encounter for follow-up examination after completed treatment for conditions other than malignant neoplasm: Secondary | ICD-10-CM

## 2018-01-12 NOTE — Progress Notes (Signed)
S/p excision sternal soft tissue mass Path d/w pt in detail  She is doing well w/o complaints  PE NAD Wound healing well, stitches removed. No infection  A/P Doing well RTC prn

## 2018-01-12 NOTE — Patient Instructions (Signed)
Please call the office with any questions or concerns.

## 2018-01-18 ENCOUNTER — Other Ambulatory Visit: Payer: Self-pay | Admitting: Family Medicine

## 2018-01-18 ENCOUNTER — Other Ambulatory Visit: Payer: Medicaid Other

## 2018-01-18 ENCOUNTER — Other Ambulatory Visit: Payer: Self-pay | Admitting: Behavioral Health

## 2018-01-18 DIAGNOSIS — E1165 Type 2 diabetes mellitus with hyperglycemia: Principal | ICD-10-CM

## 2018-01-18 DIAGNOSIS — IMO0001 Reserved for inherently not codable concepts without codable children: Secondary | ICD-10-CM

## 2018-01-18 DIAGNOSIS — Z794 Long term (current) use of insulin: Principal | ICD-10-CM

## 2018-01-18 DIAGNOSIS — B20 Human immunodeficiency virus [HIV] disease: Secondary | ICD-10-CM

## 2018-01-18 DIAGNOSIS — Z79899 Other long term (current) drug therapy: Secondary | ICD-10-CM

## 2018-01-18 DIAGNOSIS — Z113 Encounter for screening for infections with a predominantly sexual mode of transmission: Secondary | ICD-10-CM

## 2018-02-02 ENCOUNTER — Encounter: Payer: Medicaid Other | Admitting: Surgery

## 2018-02-09 ENCOUNTER — Encounter: Payer: Medicaid Other | Admitting: Infectious Diseases

## 2018-02-15 ENCOUNTER — Other Ambulatory Visit: Payer: Self-pay | Admitting: Family Medicine

## 2018-02-15 DIAGNOSIS — A6004 Herpesviral vulvovaginitis: Secondary | ICD-10-CM

## 2018-02-18 ENCOUNTER — Other Ambulatory Visit: Payer: Self-pay | Admitting: Family Medicine

## 2018-02-18 DIAGNOSIS — K219 Gastro-esophageal reflux disease without esophagitis: Secondary | ICD-10-CM

## 2018-02-18 DIAGNOSIS — I10 Essential (primary) hypertension: Secondary | ICD-10-CM

## 2018-03-15 ENCOUNTER — Other Ambulatory Visit: Payer: Self-pay | Admitting: Family Medicine

## 2018-03-15 DIAGNOSIS — E1165 Type 2 diabetes mellitus with hyperglycemia: Principal | ICD-10-CM

## 2018-03-15 DIAGNOSIS — Z794 Long term (current) use of insulin: Principal | ICD-10-CM

## 2018-03-15 DIAGNOSIS — IMO0001 Reserved for inherently not codable concepts without codable children: Secondary | ICD-10-CM

## 2018-03-22 ENCOUNTER — Other Ambulatory Visit: Payer: Self-pay

## 2018-03-22 DIAGNOSIS — E1165 Type 2 diabetes mellitus with hyperglycemia: Principal | ICD-10-CM

## 2018-03-22 DIAGNOSIS — Z794 Long term (current) use of insulin: Principal | ICD-10-CM

## 2018-03-22 DIAGNOSIS — IMO0001 Reserved for inherently not codable concepts without codable children: Secondary | ICD-10-CM

## 2018-03-22 MED ORDER — GLUCOSE BLOOD VI STRP: 1.0000 | ORAL_STRIP | Freq: Four times a day (QID) | 5 refills | Status: DC

## 2018-03-22 MED ORDER — GLUCOSE BLOOD VI STRP: 1.0000 | ORAL_STRIP | 12 refills | Status: DC

## 2018-03-22 MED ORDER — ACCU-CHEK SOFTCLIX LANCETS MISC: 12 refills | Status: DC

## 2018-03-22 MED ORDER — ACCU-CHEK SOFTCLIX LANCETS MISC
12 refills | Status: DC
Start: 2018-03-22 — End: 2019-10-11

## 2018-06-23 ENCOUNTER — Other Ambulatory Visit: Payer: Self-pay | Admitting: Family Medicine

## 2018-06-23 DIAGNOSIS — I10 Essential (primary) hypertension: Secondary | ICD-10-CM

## 2018-08-24 ENCOUNTER — Telehealth: Payer: Self-pay

## 2018-08-24 NOTE — Telephone Encounter (Signed)
Spoke with Sherry Hall about recent missed appointments and medication management. Patient states she misses 1-2 doses per week for the past 2-3 months. Patient states she is ready to make and appointment and get back into care with [facility].  Patient states she still has medication and has been taking Odefsey. Patient states current insurance is Medicaid/Medicare. Appointments scheduled for Fin/Lab on 08/30/18 and office visit scheduled with S.Dixon, NP on 09/09/18. Patient was appreciative of call and voiced understanding of upcoming appointments.

## 2018-08-25 ENCOUNTER — Other Ambulatory Visit: Payer: Self-pay | Admitting: Family Medicine

## 2018-08-25 DIAGNOSIS — IMO0001 Reserved for inherently not codable concepts without codable children: Secondary | ICD-10-CM

## 2018-08-25 DIAGNOSIS — K219 Gastro-esophageal reflux disease without esophagitis: Secondary | ICD-10-CM

## 2018-08-25 DIAGNOSIS — Z794 Long term (current) use of insulin: Principal | ICD-10-CM

## 2018-08-25 DIAGNOSIS — E1165 Type 2 diabetes mellitus with hyperglycemia: Principal | ICD-10-CM

## 2018-08-30 ENCOUNTER — Ambulatory Visit: Payer: Medicare Other

## 2018-08-30 ENCOUNTER — Other Ambulatory Visit: Payer: Medicare Other

## 2018-08-30 ENCOUNTER — Other Ambulatory Visit: Payer: Self-pay | Admitting: *Deleted

## 2018-08-30 DIAGNOSIS — B20 Human immunodeficiency virus [HIV] disease: Secondary | ICD-10-CM

## 2018-08-30 DIAGNOSIS — Z79899 Other long term (current) drug therapy: Secondary | ICD-10-CM

## 2018-08-30 DIAGNOSIS — Z113 Encounter for screening for infections with a predominantly sexual mode of transmission: Secondary | ICD-10-CM

## 2018-09-09 ENCOUNTER — Ambulatory Visit: Payer: Medicare Other | Admitting: Infectious Diseases

## 2018-09-13 ENCOUNTER — Other Ambulatory Visit: Payer: Self-pay | Admitting: Family Medicine

## 2018-09-13 DIAGNOSIS — I10 Essential (primary) hypertension: Secondary | ICD-10-CM

## 2018-10-06 ENCOUNTER — Other Ambulatory Visit: Payer: Self-pay | Admitting: Family Medicine

## 2018-10-06 DIAGNOSIS — Z794 Long term (current) use of insulin: Principal | ICD-10-CM

## 2018-10-06 DIAGNOSIS — E1165 Type 2 diabetes mellitus with hyperglycemia: Principal | ICD-10-CM

## 2018-10-06 DIAGNOSIS — IMO0001 Reserved for inherently not codable concepts without codable children: Secondary | ICD-10-CM

## 2018-11-16 ENCOUNTER — Other Ambulatory Visit: Payer: Self-pay | Admitting: Family Medicine

## 2018-11-16 DIAGNOSIS — I10 Essential (primary) hypertension: Secondary | ICD-10-CM

## 2018-12-14 ENCOUNTER — Other Ambulatory Visit: Payer: Self-pay | Admitting: Family Medicine

## 2018-12-14 DIAGNOSIS — I10 Essential (primary) hypertension: Secondary | ICD-10-CM

## 2018-12-26 ENCOUNTER — Other Ambulatory Visit: Payer: Self-pay

## 2018-12-26 ENCOUNTER — Other Ambulatory Visit: Payer: Medicare Other

## 2018-12-26 DIAGNOSIS — B20 Human immunodeficiency virus [HIV] disease: Secondary | ICD-10-CM

## 2018-12-26 DIAGNOSIS — Z113 Encounter for screening for infections with a predominantly sexual mode of transmission: Secondary | ICD-10-CM

## 2018-12-26 DIAGNOSIS — Z79899 Other long term (current) drug therapy: Secondary | ICD-10-CM

## 2018-12-27 ENCOUNTER — Telehealth: Payer: Self-pay | Admitting: Behavioral Health

## 2018-12-27 LAB — T-HELPER CELL (CD4) - (RCID CLINIC ONLY)
CD4 % Helper T Cell: 21 % — ABNORMAL LOW (ref 33–55)
CD4 T Cell Abs: 390 /uL — ABNORMAL LOW (ref 400–2700)

## 2018-12-27 NOTE — Telephone Encounter (Signed)
Sherry Hall from [employer] called to inform Sherry Alberts NP that [employer] had a glucose of 469 from 12/27/2018 .  Called patient to check on her this morning.  Informed patient her glucose was 469. She states she is always tired and drank an energy drink yesterday before having lab work.  She states she has been dealing with depression and PTSD so she is just now catching up with all of her doctor's appointments.  She states she has a glucose meter but the battery is dead and she has not been checking her blood sugars.  She states she administered her Lantus insulin sometimes.  The last dose was 2 days ago.  Encouraged patient to call her PCP to set up an appointment ASAP.  Patient agreed.  Encouraged patient to get battery for her meter so she can check her blood sugars before administering insulin.  She states she takes her Janumet sometimes also.  Explained to patient the risks of administering Insulin without knowing what her blood sugar is.  Also informed her to go to the Emergency room if she has excessively dry mouth, frequent urination, n/v, fruity breath, and confusion.  Informed her that fatigue can also be a symptom which she is already experiencing.  She states she lives at home with her fiance so he is able to help her out but she verbalized understanding and states she will call her PCP as soon as the call ended. Angeline Slim RN

## 2018-12-27 NOTE — Telephone Encounter (Signed)
Excellent thank you! I saw her elevated glucose from intake labs and so glad you were able to call to follow up.  Agree with all you advised - perfect.

## 2018-12-28 LAB — HIV-1 RNA QUANT-NO REFLEX-BLD
HIV 1 RNA Quant: 170 copies/mL — ABNORMAL HIGH
HIV-1 RNA QUANT, LOG: 2.23 {Log_copies}/mL — AB

## 2018-12-28 LAB — CBC WITH DIFFERENTIAL/PLATELET
Absolute Monocytes: 250 cells/uL (ref 200–950)
BASOS PCT: 1 %
Basophils Absolute: 49 cells/uL (ref 0–200)
Eosinophils Absolute: 172 cells/uL (ref 15–500)
Eosinophils Relative: 3.5 %
HEMATOCRIT: 36.8 % (ref 35.0–45.0)
Hemoglobin: 12.2 g/dL (ref 11.7–15.5)
Lymphs Abs: 1764 cells/uL (ref 850–3900)
MCH: 30.9 pg (ref 27.0–33.0)
MCHC: 33.2 g/dL (ref 32.0–36.0)
MCV: 93.2 fL (ref 80.0–100.0)
MPV: 12 fL (ref 7.5–12.5)
Monocytes Relative: 5.1 %
Neutro Abs: 2666 cells/uL (ref 1500–7800)
Neutrophils Relative %: 54.4 %
Platelets: 198 10*3/uL (ref 140–400)
RBC: 3.95 10*6/uL (ref 3.80–5.10)
RDW: 12.7 % (ref 11.0–15.0)
Total Lymphocyte: 36 %
WBC: 4.9 10*3/uL (ref 3.8–10.8)

## 2018-12-28 LAB — LIPID PANEL
Cholesterol: 235 mg/dL — ABNORMAL HIGH (ref <200)
HDL: 51 mg/dL (ref >=50)
LDL CHOLESTEROL (CALC): 147 mg/dL — AB
Non-HDL Cholesterol (Calc): 184 mg/dL (calc) — ABNORMAL HIGH (ref <130)
Total CHOL/HDL Ratio: 4.6 (calc) (ref <5.0)
Triglycerides: 230 mg/dL — ABNORMAL HIGH (ref <150)

## 2018-12-28 LAB — COMPREHENSIVE METABOLIC PANEL
AG Ratio: 0.8 (calc) — ABNORMAL LOW (ref 1.0–2.5)
ALT: 12 U/L (ref 6–29)
AST: 9 U/L — ABNORMAL LOW (ref 10–30)
Albumin: 3.2 g/dL — ABNORMAL LOW (ref 3.6–5.1)
Alkaline phosphatase (APISO): 69 U/L (ref 31–125)
BUN: 14 mg/dL (ref 7–25)
CO2: 26 mmol/L (ref 20–32)
Calcium: 8.9 mg/dL (ref 8.6–10.2)
Chloride: 100 mmol/L (ref 98–110)
Creat: 0.98 mg/dL (ref 0.50–1.10)
Globulin: 4 g/dL (calc) — ABNORMAL HIGH (ref 1.9–3.7)
Glucose, Bld: 469 mg/dL — ABNORMAL HIGH (ref 65–99)
Potassium: 4.2 mmol/L (ref 3.5–5.3)
Sodium: 134 mmol/L — ABNORMAL LOW (ref 135–146)
Total Bilirubin: 0.3 mg/dL (ref 0.2–1.2)
Total Protein: 7.2 g/dL (ref 6.1–8.1)

## 2018-12-28 LAB — RPR: RPR: NONREACTIVE

## 2018-12-30 ENCOUNTER — Other Ambulatory Visit: Payer: Self-pay | Admitting: Family Medicine

## 2018-12-30 DIAGNOSIS — IMO0001 Reserved for inherently not codable concepts without codable children: Secondary | ICD-10-CM

## 2018-12-30 DIAGNOSIS — Z794 Long term (current) use of insulin: Principal | ICD-10-CM

## 2018-12-30 DIAGNOSIS — E1165 Type 2 diabetes mellitus with hyperglycemia: Principal | ICD-10-CM

## 2019-01-07 ENCOUNTER — Other Ambulatory Visit: Payer: Self-pay | Admitting: Family Medicine

## 2019-01-07 DIAGNOSIS — E1165 Type 2 diabetes mellitus with hyperglycemia: Secondary | ICD-10-CM

## 2019-01-07 DIAGNOSIS — IMO0001 Reserved for inherently not codable concepts without codable children: Secondary | ICD-10-CM

## 2019-01-07 DIAGNOSIS — K219 Gastro-esophageal reflux disease without esophagitis: Secondary | ICD-10-CM

## 2019-01-07 DIAGNOSIS — Z794 Long term (current) use of insulin: Secondary | ICD-10-CM

## 2019-01-17 ENCOUNTER — Encounter: Payer: Medicare Other | Admitting: Infectious Diseases

## 2019-01-18 ENCOUNTER — Encounter: Payer: Self-pay | Admitting: Infectious Diseases

## 2019-01-18 ENCOUNTER — Other Ambulatory Visit: Payer: Self-pay

## 2019-01-18 ENCOUNTER — Ambulatory Visit (INDEPENDENT_AMBULATORY_CARE_PROVIDER_SITE_OTHER): Payer: Medicare Other | Admitting: Infectious Diseases

## 2019-01-18 DIAGNOSIS — B20 Human immunodeficiency virus [HIV] disease: Secondary | ICD-10-CM

## 2019-01-18 DIAGNOSIS — R5382 Chronic fatigue, unspecified: Secondary | ICD-10-CM | POA: Insufficient documentation

## 2019-01-18 DIAGNOSIS — E118 Type 2 diabetes mellitus with unspecified complications: Secondary | ICD-10-CM | POA: Diagnosis not present

## 2019-01-18 DIAGNOSIS — Z794 Long term (current) use of insulin: Secondary | ICD-10-CM

## 2019-01-18 DIAGNOSIS — R5383 Other fatigue: Secondary | ICD-10-CM | POA: Insufficient documentation

## 2019-01-18 DIAGNOSIS — F329 Major depressive disorder, single episode, unspecified: Secondary | ICD-10-CM

## 2019-01-18 DIAGNOSIS — F32A Depression, unspecified: Secondary | ICD-10-CM

## 2019-01-18 NOTE — Assessment & Plan Note (Signed)
Back on medications for maintenance at this time.  She is currently working with neuropsychiatric care center.  We spent most of our discussing today most of the visit was spent today discussing her symptoms of anxiety and depression and background/context. She will receive a call from our counselor Rene Kocher to touch base and provide support as well.  She seems to have a good plan to approach her multiple medical problems in a way that will not overwhelm her.

## 2019-01-18 NOTE — Assessment & Plan Note (Signed)
Her complaint of fatigue is most likely multifactorial considering uncontrolled HIV, uncontrolled diabetes and hyperglycemia, uncontrolled blood pressure, dehydration with high blood sugars, depression and anxiety. We will address this slowly over time with correcting her medical problems and helping her get access to care.

## 2019-01-18 NOTE — Assessment & Plan Note (Signed)
Currently under poor control and only taking her Lantus once daily.  She does not check her blood sugars at home.  She is working to get back with her primary care provider.  I offered our assistance to help her achieve this.  We talked about eliminating sugary beverages while we wait as these will rapidly escalate her blood sugar and make her feel poorly.

## 2019-01-18 NOTE — Assessment & Plan Note (Signed)
We discussed alternative options for her treatment of HIV disease.  We decided to try Tivicay and Descovy once daily.  Both of these pills are small and easy to swallow for her.  They also do not have a food requirement if she is unable to eat consistently due to her depression.  I think this is also going to offer her a higher barrier to resistance.  Ideally she would do well in a PI however has multiple comorbidities and worry about medication interactions with cobicistat component.  I would like to see her back in 2 months in the office we will repeat blood work at that time and follow-up on how she is doing with her HIV medication switch.  I will send her a my chart update to check in in 2 weeks as well and encouraged her to do the same to keep in close communication with her medical team.

## 2019-01-18 NOTE — Progress Notes (Signed)
Name: Sherry Hall  NGE:[id number]   DOB: 1980/10/27   PCP: Sherry Dew, FNP   Virtual Visit via Telephone Note  I connected with Sherry Hall on 01/18/19 at 10:00 AM EDT by telephone and verified that I am speaking with the correct person using two identifiers.   I discussed the limitations, risks, security and privacy concerns of performing an evaluation and management service by telephone and the availability of in person appointments. I also discussed with the patient that there may be a patient responsible charge related to this service. The patient expressed understanding and agreed to proceed.   Chief Complaint  Patient presents with  . EVISIT    Off meds d/t depression/ptsd, taking odefsey every other day; needs to follow up with PCP for diabetic management    History of Present Illness: Sherry Hall is a 38 y.o. female with HIV disease. Dx 2011. Intermittently on medications. Co morbidities: DM, HTN, Hyperlipidemia, obesity. HIV Risk: heterosexual Hx: OIs: none known Hep B sAg (not on file); Hep BsAb (neg); Hep B cAb (not on file); Hep C (not on file); Hep A (not on file); HLA B*5701 (-); Quantiferon (-)   Previous Regimens: Odefsey    Genotypes: No PI/RTI mutations noted.  HPI:  Sherry Hall is only taking Odefsey maybe 2-4 times a week. She often has a hard time with "bloating and tummy troubles" which makes it difficult to take every day.  She does take this with food.  She recalls that Dr. Johnnye Sima was talking about an alternative regimen at one point for her treatment.  She would like to discuss if there is other medicines that can make her less nauseous or have less stomach bloating.   The past year in particular has been very difficult for her with regards to anxiety, fear of what is discussed from family, loss of her son.  She is trying to get herself back into care and address all of her multiple health needs one at a time.  She has an  upcoming appointment tomorrow with the neuropsychiatric care center to help with medication management.  She is currently taking medication for her depression daily.  She feels like it has helped quite a bit in comparison to how she was feeling prior to resuming these.   Her biggest concern is fatigue - she has elevated blood pressure and gets dizzy. She is not sure if it is related to the blood pressure, medicine, diabetes.  She has tried to eliminate all sugar-containing drinks.  She drinks a lot of water throughout the day.  She is not taking all of her diabetes medications, only the Lantus 30 units every day.  She does not check her blood sugars at home currently.  She is trying to get reestablished with her primary care team and working on an appointment now.   Reports no complaints today suggestive of associated opportunistic infection or advancing HIV disease such as fevers, night sweats, weight loss, anorexia, cough, SOB, nausea, vomiting, diarrhea, headache, sensory changes, lymphadenopathy or oral thrush.     Medical/surgical/social/family history have been updated during today's visit.    Observations/Objective: Sherry Hall is often tearful on the phone today in discussions about her son.  She however focuses herself nicely today and can outline a good plan to approach getting her back into health care and addressing her multiple needs.    HIV 1 RNA Quant (copies/mL)  Date Value  12/26/2018 170 (H)  07/28/2017 66 (H)  07/08/2016 7,518 (H)  CD4 T Cell Abs (/uL)  Date Value  12/26/2018 390 (L)  07/28/2017 510  11/23/2016 480    Lab Results  Component Value Date   CREATININE 0.98 12/26/2018   CREATININE 0.74 01/03/2018   CREATININE 0.97 11/12/2017    Lab Results  Component Value Date   WBC 4.9 12/26/2018   HGB 12.2 12/26/2018   HCT 36.8 12/26/2018   MCV 93.2 12/26/2018   PLT 198 12/26/2018    Lab Results  Component Value Date   ALT 12 12/26/2018   AST 9 (L) 12/26/2018    ALKPHOS 85 09/23/2017   BILITOT 0.3 12/26/2018     Assessment and Plan: Problem List Items Addressed This Visit      Unprioritized   HIV disease ([place])    We discussed alternative options for her treatment of HIV disease.  We decided to try Tivicay and Descovy once daily.  Both of these pills are small and easy to swallow for her.  They also do not have a food requirement if she is unable to eat consistently due to her depression.  I think this is also going to offer her a higher barrier to resistance.  Ideally she would do well in a PI however has multiple comorbidities and worry about medication interactions with cobicistat component.  I would like to see her back in 2 months in the office we will repeat blood work at that time and follow-up on how she is doing with her HIV medication switch.  I will send her a my chart update to check in in 2 weeks as well and encouraged her to do the same to keep in close communication with her medical team.       Depression    Back on medications for maintenance at this time.  She is currently working with neuropsychiatric care center.  We spent most of our discussing today most of the visit was spent today discussing her symptoms of anxiety and depression and background/context. She will receive a call from our counselor Rollene Fare to touch base and provide support as well.  She seems to have a good plan to approach her multiple medical problems in a way that will not overwhelm her.      Relevant Medications   ALPRAZolam (XANAX) 1 MG tablet   busPIRone (BUSPAR) 10 MG tablet   sertraline (ZOLOFT) 50 MG tablet   Type 2 diabetes mellitus with complication, with long-term current use of insulin (HCC)    Currently under poor control and only taking her Lantus once daily.  She does not check her blood sugars at home.  She is working to get back with her primary care provider.  I offered our assistance to help her achieve this.  We talked about eliminating sugary  beverages while we wait as these will rapidly escalate her blood sugar and make her feel poorly.      Fatigue    Her complaint of fatigue is most likely multifactorial considering uncontrolled HIV, uncontrolled diabetes and hyperglycemia, uncontrolled blood pressure, dehydration with high blood sugars, depression and anxiety. We will address this slowly over time with correcting her medical problems and helping her get access to care.           Follow Up Instructions: Gradual return in 2 months.  She has several things to follow-up on in the meantime.  Offered counseling through our team, however she has a neuropsychiatry team she is re-engaging with next week.  I discussed the assessment and  treatment plan with the patient. The patient was provided an opportunity to ask questions and all were answered. The patient agreed with the plan and demonstrated an understanding of the instructions.   The patient was advised to call back or seek an in-person evaluation if the symptoms worsen or if the condition fails to improve as anticipated.  I provided 30 minutes of non-face-to-face time during this encounter.   Janene Madeira, MSN, NP-C [facility] [email or web address] Pager: [phone] Office: [phone] [place]: [phone]

## 2019-01-21 MED ORDER — DOLUTEGRAVIR SODIUM 50 MG PO TABS: 50.0000 mg | ORAL_TABLET | Freq: Every day | ORAL | 5 refills | Status: DC

## 2019-01-21 MED ORDER — EMTRICITABINE-TENOFOVIR AF 200-25 MG PO TABS: 1.0000 | ORAL_TABLET | Freq: Every day | ORAL | 5 refills | Status: DC

## 2019-01-21 NOTE — Patient Instructions (Addendum)
It was so nice to connect with you on the phone today.  I am looking forward to meeting you in person.  I think you are approaching handling your mental and physical health needs is perfect.  We had wonderful counselors here that are happy to reach out via phone to help you if needed.  I am happy to hear that you have got your neuropsych appointment coming up soon.  Please stop taking your Odefsey.  We will switch you to a new regimen that will include 2 small pills.  Tivicay, a small yellow circle pill and Descovy, a small rectangular blue pill.  These are meant to be taken once a day together.  You may take these with or without food.   Common side effects from this medication may include some mild headaches over the first couple of weeks, nausea (if this happens take with food), or mild diarrhea.  Typically the symptoms resolve with a few weeks of use so give it at least 4 to 6-week trial.  Please come back in 2 months so we can repeat your lab work.  When you are able to please make an appointment with your family medicine team so you can get your diabetes back on track too.  Remember 1 step at a time.

## 2019-03-21 ENCOUNTER — Ambulatory Visit: Payer: Medicare Other | Admitting: Infectious Diseases

## 2019-04-05 ENCOUNTER — Other Ambulatory Visit: Payer: Self-pay

## 2019-04-05 ENCOUNTER — Emergency Department (HOSPITAL_COMMUNITY)
Admission: EM | Admit: 2019-04-05 | Discharge: 2019-04-05 | Disposition: A | Discharge status: Home / Self Care | Payer: Medicare Other | Attending: Emergency Medicine | Admitting: Emergency Medicine

## 2019-04-05 DIAGNOSIS — Z794 Long term (current) use of insulin: Secondary | ICD-10-CM | POA: Diagnosis not present

## 2019-04-05 DIAGNOSIS — F17210 Nicotine dependence, cigarettes, uncomplicated: Secondary | ICD-10-CM | POA: Insufficient documentation

## 2019-04-05 DIAGNOSIS — L0291 Cutaneous abscess, unspecified: Secondary | ICD-10-CM

## 2019-04-05 DIAGNOSIS — I10 Essential (primary) hypertension: Secondary | ICD-10-CM | POA: Diagnosis not present

## 2019-04-05 DIAGNOSIS — L02211 Cutaneous abscess of abdominal wall: Secondary | ICD-10-CM | POA: Diagnosis not present

## 2019-04-05 DIAGNOSIS — K0889 Other specified disorders of teeth and supporting structures: Secondary | ICD-10-CM | POA: Insufficient documentation

## 2019-04-05 DIAGNOSIS — Z79899 Other long term (current) drug therapy: Secondary | ICD-10-CM | POA: Insufficient documentation

## 2019-04-05 DIAGNOSIS — Z21 Asymptomatic human immunodeficiency virus [HIV] infection status: Secondary | ICD-10-CM | POA: Diagnosis not present

## 2019-04-05 DIAGNOSIS — E119 Type 2 diabetes mellitus without complications: Secondary | ICD-10-CM | POA: Insufficient documentation

## 2019-04-05 DIAGNOSIS — Z91040 Latex allergy status: Secondary | ICD-10-CM | POA: Diagnosis not present

## 2019-04-05 MED ORDER — HYDROCODONE-ACETAMINOPHEN 5-325 MG PO TABS
1.0000 | ORAL_TABLET | Freq: Once | ORAL | Status: AC
  Administered 2019-04-05: 18:00:00 1 via ORAL
  Filled 2019-04-05: qty 1

## 2019-04-05 MED ORDER — FLUCONAZOLE 150 MG PO TABS: 150.0000 mg | ORAL_TABLET | Freq: Every day | ORAL | 0 refills | Status: AC

## 2019-04-05 MED ORDER — DOXYCYCLINE HYCLATE 100 MG PO CAPS: 100.0000 mg | ORAL_CAPSULE | Freq: Two times a day (BID) | ORAL | 0 refills | Status: DC

## 2019-04-05 MED ORDER — HYDROCODONE-ACETAMINOPHEN 5-325 MG PO TABS: 1.0000 | ORAL_TABLET | ORAL | 0 refills | Status: DC | PRN

## 2019-04-05 NOTE — ED Notes (Signed)
Patient verbalizes understanding of discharge instructions . Opportunity for questions and answers were provided . Armband removed by staff ,Pt discharged from ED. W/C  offered at D/C  and Declined W/C at D/C and was escorted to lobby by RN.  

## 2019-04-05 NOTE — ED Triage Notes (Signed)
Pt reports an abscess on ABD under skin fold. Pt reports abscess has been there for weeks and weeks. Pt also has a dental pain on rt upper side.

## 2019-04-05 NOTE — ED Provider Notes (Signed)
[facility] EMERGENCY DEPARTMENT Provider Note   CSN: [id number] Arrival date & time: 04/05/19  1415    History   Chief Complaint Chief Complaint  Patient presents with  . Dental Pain  . Recurrent Skin Infections    HPI Shanyah Stallbaumer is a 38 y.o. female.     HPI    39 old female presents today with several complaints.  Patient notes several week history of abscess to her lower abdomen.  She notes this has been draining, denies any surrounding redness.  She notes minimal pain to the area.  She notes history of the same.  Patient also notes that she has had dental pain on both sides of her upper gumline's.  Presently this is located on the right lateral gumline no swelling or discharge.  No decreased range of motion of the jaw.  She denies any fever.  She notes she is diabetic but well controlled.  She is not pregnant or breast-feeding.  Past Medical History:  Diagnosis Date  . Anemia    H/O  . Anxiety   . Depression   . Diabetes mellitus type 1 ([place])   . GERD (gastroesophageal reflux disease)   . HIV (human immunodeficiency virus infection) ([place]) 2011  . Hypertension     Patient Active Problem List   Diagnosis Date Noted  . Fatigue 01/18/2019  . Cutaneous abscess of chest wall   . Tobacco dependence 10/20/2016  . Gingivitis 07/08/2016  . Type 2 diabetes mellitus with complication, with long-term current use of insulin ([place]) 01/03/2016  . HTN (hypertension) 09/16/2015  . HIV disease ([place]) 09/16/2015  . Depression 09/16/2015  . Numbness and tingling of both legs below knees 06/05/2015  . 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 Vaginal discharge 05/01/2014  . Insomnia 04/04/2012  . Microalbuminuria 04/04/2012  . Hyperlipidemia with target LDL less than 100 03/21/2012  . Obesity 03/21/2012  . Posttraumatic stress disorder 10/01/2011  . Adjustment disorder with anxiety 07/28/2011    Past Surgical History:  Procedure Laterality  Date  . CESAREAN SECTION  2001, 2008, 2009, 2011  . CHOLECYSTECTOMY    . EXCISION OF BREAST LESION N/A 01/04/2018   Procedure: EXCISION OF CHEST WALL ABSCESS. MIDDLE CHEST;  Surgeon: Olean Ree, MD;  Location: [facility] ORS;  Service: General;  Laterality: N/A;  resection of chest wall cyst / abscess  . FRACTURE SURGERY Right    HAND  . TONSILLECTOMY    . TUBAL LIGATION  2011     OB History   No obstetric history on file.      Home Medications    Prior to Admission medications   Medication Sig Start Date End Date Taking? Authorizing Provider  ACCU-CHEK SOFTCLIX LANCETS lancets USE AS INSTRUCTED 4 times daily Patient not taking: Reported on 01/18/2019 03/22/18   Dorena Dew, FNP  ALPRAZolam Duanne Moron) 1 MG tablet  08/01/18   [provider]  ARIPiprazole (ABILIFY) 10 MG tablet  11/30/18   [provider]  atorvastatin (LIPITOR) 10 MG tablet TAKE 1 TABLET (10 MG TOTAL) BY MOUTH DAILY. Patient taking differently: Take 10 mg by mouth daily at 6 PM.  01/01/17   Dorena Dew, FNP  Blood Glucose Monitoring Suppl (ACCU-CHEK AVIVA PLUS) w/Device KIT 1 each by Does not apply route 4 (four) times daily - after meals and at bedtime. Patient not taking: Reported on 01/18/2019 10/21/16   Dorena Dew, FNP  buPROPion (WELLBUTRIN XL) 150 MG 24 hr tablet Take  150 mg by mouth every morning.     [provider]  busPIRone (BUSPAR) 10 MG tablet  12/26/18   [provider]  dolutegravir (TIVICAY) 50 MG tablet Take 1 tablet (50 mg total) by mouth daily. 01/21/19   Towanda Callas, NP  doxycycline (VIBRAMYCIN) 100 MG capsule Take 1 capsule (100 mg total) by mouth 2 (two) times daily. 04/05/19   Massiah Minjares, Dellis Filbert, PA-C  emtricitabine-tenofovir AF (DESCOVY) 200-25 MG tablet Take 1 tablet by mouth daily. 01/21/19   [place] Callas, NP  fluconazole (DIFLUCAN) 150 MG tablet Take 1 tablet (150 mg total) by mouth daily for 1 day. 04/05/19 04/06/19  Marcille Barman, Dellis Filbert, PA-C  GLOBAL  EASE INJECT PEN NEEDLES 32G X 4 MM MISC USE AS DIRECTED TO ADMINISTER INSULIN THREE TIMES DAILY BEFORE MEALS Patient not taking: Reported on 01/18/2019 10/06/18   Lanae Boast, FNP  glucose blood (ACCU-CHEK AVIVA PLUS) test strip 1 each by Other route 4 (four) times daily. Use as instructed Patient not taking: Reported on 01/18/2019 03/22/18   Dorena Dew, FNP  glucose blood (ACCU-CHEK AVIVA) test strip Use as instructed Patient not taking: Reported on 01/18/2019 11/17/16   Dorena Dew, FNP  HYDROcodone-acetaminophen (NORCO/VICODIN) 5-325 MG tablet Take 1 tablet by mouth every 4 (four) hours as needed. 04/05/19   Jerin Franzel, Dellis Filbert, PA-C  insulin aspart (NOVOLOG) 100 UNIT/ML FlexPen Per sliding scale Patient not taking: Reported on 01/18/2019 09/23/17   Dorena Dew, FNP  LANTUS SOLOSTAR 100 UNIT/ML Solostar Pen INJECT 30 UNITS INTO THE SKIN DAILY AT 10 IN THE EVENING 08/25/18   Lanae Boast, FNP  lisinopril-hydrochlorothiazide (PRINZIDE,ZESTORETIC) 20-25 MG tablet TAKE ONE TABLET BY MOUTH DAILY 12/14/18   Lanae Boast, FNP  metFORMIN (GLUCOPHAGE) 500 MG tablet Take 1,000 mg by mouth 2 (two) times daily.  07/26/13   [provider]  ondansetron (ZOFRAN) 4 MG tablet Take 4 mg by mouth every 8 (eight) hours as needed for nausea or vomiting.    [provider]  propranolol (INDERAL) 10 MG tablet Take 10 mg by mouth 2 (two) times daily.    [provider]  ranitidine (ZANTAC) 300 MG tablet TAKE 1 TABLET (300 MG TOTAL) BY MOUTH AT BEDTIME. 08/25/18   Lanae Boast, FNP  sertraline (ZOLOFT) 50 MG tablet  12/26/18   [provider]  sitaGLIPtin-metformin (JANUMET) 50-1000 MG tablet TAKE 1 TABLET BY MOUTH TWO (TWO) TIMES DAILY WITH A MEAL. Patient not taking: Reported on 01/18/2019 09/23/17   Dorena Dew, FNP  traZODone (DESYREL) 50 MG tablet Take 50-100 mg by mouth at bedtime as needed for sleep.  10/28/16   [provider]    Family History Family  History  Problem Relation Age of Onset  . Diabetes Father   . Hypertension Father   . Cancer Mother        cervical     Social History Social History   Tobacco Use  . Smoking status: Current Every Day Smoker    Packs/day: 0.50    Years: 15.00    Pack years: 7.50    Types: Cigarettes  . Smokeless tobacco: Never Used  . Tobacco comment: trying to slow  Substance Use Topics  . Alcohol use: Yes    Alcohol/week: 1.0 standard drinks    Types: 1 Standard drinks or equivalent per week    Comment: socially  . Drug use: No     Allergies   Latex and Percocet [oxycodone-acetaminophen]   Review of Systems Review  of Systems  All other systems reviewed and are negative.    Physical Exam Updated Vital Signs BP (!) 152/81 (BP Location: Left Arm)   Pulse 83   Temp 98.8 F (37.1 C) (Oral)   Resp 18   Ht 5' (1.524 m)   LMP 03/29/2019   SpO2 100%   BMI 55.15 kg/m   Physical Exam Vitals signs and nursing note reviewed.  Constitutional:      Appearance: She is well-developed.  HENT:     Head: Normocephalic and atraumatic.     Comments: Numerous missing teeth and dental decay throughout, is palpated with no tenderness or fluctuance Eyes:     General: No scleral icterus.       Right eye: No discharge.        Left eye: No discharge.     Conjunctiva/sclera: Conjunctivae normal.     Pupils: Pupils are equal, round, and reactive to light.  Neck:     Musculoskeletal: Normal range of motion.     Vascular: No JVD.     Trachea: No tracheal deviation.  Pulmonary:     Effort: Pulmonary effort is normal.     Breath sounds: No stridor.  Skin:    Comments: 1 cm area of induration to the mid low abdomen with purulent drainage-no surrounding redness  Neurological:     Mental Status: She is alert and oriented to person, place, and time.     Coordination: Coordination normal.  Psychiatric:        Behavior: Behavior normal.        Thought Content: Thought content normal.         Judgment: Judgment normal.      ED Treatments / Results  Labs (all labs ordered are listed, but only abnormal results are displayed) Labs Reviewed - No data to display  EKG None  Radiology No results found.  Procedures Procedures (including critical care time)  Medications Ordered in ED Medications  HYDROcodone-acetaminophen (NORCO/VICODIN) 5-325 MG per tablet 1 tablet (1 tablet Oral Given 04/05/19 1809)     Initial Impression / Assessment and Plan / ED Course  I have reviewed the triage vital signs and the nursing notes.  Pertinent labs & imaging results that were available during my care of the patient were reviewed by me and considered in my medical decision making (see chart for details).        38 year old female presents today with abscess.  This is already draining, she is also having dental pain.  She be placed on antibiotics encouraged follow-up with dental resources return if her symptoms worsen follow-up with primary care if abscess continues to drain without resolution.  She verbalized understanding and agreement to today's plan had no further questions or concerns.  Final Clinical Impressions(s) / ED Diagnoses   Final diagnoses:  Abscess  Pain, dental    ED Discharge Orders         Ordered    doxycycline (VIBRAMYCIN) 100 MG capsule  2 times daily     04/05/19 1821    HYDROcodone-acetaminophen (NORCO/VICODIN) 5-325 MG tablet  Every 4 hours PRN     04/05/19 1821    fluconazole (DIFLUCAN) 150 MG tablet  Daily     04/05/19 1832           Okey Regal, PA-C 04/06/19 1158    Isla Pence, MD 04/06/19 1535

## 2019-04-05 NOTE — Discharge Instructions (Addendum)
Please read attached information. If you experience any new or worsening signs or symptoms please return to the emergency room for evaluation. Please follow-up with your primary care provider or specialist as discussed. Please use medication prescribed only as directed and discontinue taking if you have any concerning signs or symptoms.   °

## 2019-04-06 ENCOUNTER — Telehealth: Payer: Self-pay

## 2019-04-06 NOTE — Telephone Encounter (Signed)
Received call from [facility] to confirm patient is being seen by [facility]. LPN confirmed patient is in care with [facility].  Eugenia Mcalpine, LPN

## 2019-04-15 ENCOUNTER — Other Ambulatory Visit: Payer: Self-pay | Admitting: Family Medicine

## 2019-04-15 DIAGNOSIS — IMO0001 Reserved for inherently not codable concepts without codable children: Secondary | ICD-10-CM

## 2019-06-27 ENCOUNTER — Other Ambulatory Visit: Payer: Self-pay | Admitting: Infectious Diseases

## 2019-06-27 ENCOUNTER — Other Ambulatory Visit: Payer: Self-pay | Admitting: Family Medicine

## 2019-06-27 DIAGNOSIS — IMO0001 Reserved for inherently not codable concepts without codable children: Secondary | ICD-10-CM

## 2019-07-03 ENCOUNTER — Other Ambulatory Visit: Payer: Self-pay | Admitting: *Deleted

## 2019-07-03 DIAGNOSIS — B20 Human immunodeficiency virus [HIV] disease: Secondary | ICD-10-CM

## 2019-07-04 ENCOUNTER — Other Ambulatory Visit: Payer: Medicare Other

## 2019-07-11 ENCOUNTER — Ambulatory Visit: Payer: Medicare Other | Admitting: Infectious Diseases

## 2019-07-24 ENCOUNTER — Other Ambulatory Visit: Payer: Self-pay | Admitting: Family Medicine

## 2019-07-24 ENCOUNTER — Other Ambulatory Visit: Payer: Self-pay | Admitting: Infectious Diseases

## 2019-08-08 ENCOUNTER — Telehealth: Payer: Self-pay

## 2019-08-08 NOTE — Telephone Encounter (Signed)
Called patient to schedule overdue appointment with our office. Per patient she would like to have lab work and office visit same time. States she has a lot going on and feels this will be easier for her. Scheduled patient to come in on 10/22 to see NP. [place]

## 2019-08-08 NOTE — Telephone Encounter (Signed)
Thank you kindly!

## 2019-08-10 ENCOUNTER — Encounter: Payer: Self-pay | Admitting: Infectious Diseases

## 2019-08-10 ENCOUNTER — Ambulatory Visit (INDEPENDENT_AMBULATORY_CARE_PROVIDER_SITE_OTHER): Payer: Medicare Other | Admitting: Infectious Diseases

## 2019-08-10 ENCOUNTER — Other Ambulatory Visit: Payer: Self-pay

## 2019-08-10 VITALS — BP 142/91 | HR 114 | Wt 275.0 lb

## 2019-08-10 DIAGNOSIS — E118 Type 2 diabetes mellitus with unspecified complications: Secondary | ICD-10-CM | POA: Diagnosis not present

## 2019-08-10 DIAGNOSIS — Z794 Long term (current) use of insulin: Secondary | ICD-10-CM | POA: Diagnosis not present

## 2019-08-10 DIAGNOSIS — I10 Essential (primary) hypertension: Secondary | ICD-10-CM | POA: Diagnosis not present

## 2019-08-10 DIAGNOSIS — F419 Anxiety disorder, unspecified: Secondary | ICD-10-CM | POA: Diagnosis not present

## 2019-08-10 DIAGNOSIS — Z6841 Body Mass Index (BMI) 40.0 and over, adult: Secondary | ICD-10-CM | POA: Diagnosis not present

## 2019-08-10 DIAGNOSIS — E1065 Type 1 diabetes mellitus with hyperglycemia: Secondary | ICD-10-CM

## 2019-08-10 DIAGNOSIS — Z23 Encounter for immunization: Secondary | ICD-10-CM | POA: Diagnosis not present

## 2019-08-10 DIAGNOSIS — B20 Human immunodeficiency virus [HIV] disease: Secondary | ICD-10-CM | POA: Diagnosis not present

## 2019-08-10 MED ORDER — BLOOD GLUCOSE MONITOR KIT: PACK | 0 refills | Status: DC

## 2019-08-10 MED ORDER — JANUMET 50-1000 MG PO TABS: ORAL_TABLET | ORAL | 2 refills | Status: DC

## 2019-08-10 MED ORDER — ACCU-CHEK AVIVA PLUS VI STRP: 1.0000 | ORAL_STRIP | Freq: Four times a day (QID) | 5 refills | Status: DC

## 2019-08-10 MED ORDER — INSULIN ASPART 100 UNIT/ML FLEXPEN: PEN_INJECTOR | SUBCUTANEOUS | 11 refills | Status: DC

## 2019-08-10 NOTE — Progress Notes (Signed)
Name: Sherry Hall  QIO:[id number]   DOB: October 30, 1980   PCP: Dorena Dew, FNP    Patient Active Problem List   Diagnosis Date Noted  . Type 1 diabetes mellitus with hyperglycemia ([place]) 01/03/2016    Priority: High  . HTN (hypertension) 09/16/2015    Priority: High  . HIV disease ([place]) 09/16/2015    Priority: High  . Obesity 03/21/2012    Priority: Medium  . Fatigue 01/18/2019  . Tobacco dependence 10/20/2016  . Depression 09/16/2015  . Numbness and tingling of both legs below knees 06/05/2015  . 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 Insomnia 04/04/2012  . Microalbuminuria 04/04/2012  . Hyperlipidemia with target LDL less than 100 03/21/2012  . Posttraumatic stress disorder 10/01/2011  . Anxiety disorder 07/28/2011    Brief History ID: Sherry Hall is a 38 y.o. female with HIV disease. Dx 2011. Intermittently on medications. Co morbidities: T1DM, HTN, Hyperlipidemia, obesity. HIV Risk: heterosexual Hx: OIs: none known  Hep B sAg (not on file); Hep BsAb (neg); Hep B cAb (not on file); Hep C (not on file); Hep A (not on file); HLA B*5701 (-); Quantiferon (-)  Previous Regimens:  Complera  [place]   Genotypes:  2017 - no significant mutations PR/RT   CC:  HIV follow up care. Increased anxiety. Ran out of insulin.    HPI:  Sherry Hall is here today after we had a phone visit in April of 2020 to restart her medications. Due to her infrequent adherence with [facility] she was switched to [place]. She likes this regimen a lot and has not had any trouble with side effects to medications. She likes that she does not need to take with food because that was previously a barrier. She did not resume these right away but rather only a month or two ago by her estimate. She had an abscess come up under stomach at mons pubis. This spontaneously ruptured and has been feeling better. She hopes she does not need an antibiotic because she always  gets a yeast infection. Prior to this she had a dental abscess.   Her diabetes is poorly controlled. She "doesn't eat what she should." Is good at staying away from sugary drinks but uses a lot of sugar in her coffee; estimates 3-4 a day. She ran out of her Novolog insulin pen but continues to use Lantus intermittently at night 30 units. She also needs a new meter and supplies.  She is unable to get into her PCP until December at the earliest.   She has continued to struggle with anxiety and depressed mood. Largest stressors have been the murder of her son 3 years ago and now having two special needs kids at home. Her husband whom is also HIV+ is frequently sick because he is off and on medications.  Recently re-engaged with psychiatrist and is working on medication adjustment. She has done counseling in the past but did not find it very helpful for her. She does not feel as if her medications have every been perfect for her.   She has resumed her blood pressure medications and is doing better with that. Pap smear > 5 years ago by her estimate. Needs to update this also.    Review of Systems  Constitutional: Positive for fatigue. Negative for appetite change, chills, fever and unexpected weight change.  HENT: Positive for dental problem. Negative for mouth sores, sore throat and trouble swallowing.   Eyes: Negative for pain and visual  disturbance.  Respiratory: Negative for cough and shortness of breath.   Cardiovascular: Negative for chest pain.  Gastrointestinal: Negative for abdominal pain, diarrhea and nausea.  Endocrine: Positive for polydipsia.  Genitourinary: Negative for dyspareunia, dysuria, frequency, genital sores, menstrual problem and pelvic pain.  Musculoskeletal: Negative for back pain and neck pain.  Skin: Positive for wound. Negative for color change and rash.  Allergic/Immunologic: Positive for immunocompromised state.  Neurological: Negative for weakness, numbness and  headaches.  Hematological: Negative for adenopathy.  Psychiatric/Behavioral: Positive for dysphoric mood and sleep disturbance. The patient is nervous/anxious.     Observations/Objective: Today's Vitals   08/10/19 1509  BP: (!) 142/91  Pulse: (!) 114  Weight: 275 lb (124.7 kg)  PainSc: 0-No pain   Body mass index is 53.71 kg/m.   Physical Exam Constitutional:      Appearance: She is well-developed.     Comments: Seated comfortably in chair.   HENT:     Mouth/Throat:     Mouth: Mucous membranes are moist. No oral lesions.     Dentition: Normal dentition. No dental abscesses.     Pharynx: No oropharyngeal exudate.  Eyes:     General: No scleral icterus.    Pupils: Pupils are equal, round, and reactive to light.  Cardiovascular:     Rate and Rhythm: Regular rhythm. Tachycardia present.     Heart sounds: Normal heart sounds.  Pulmonary:     Effort: Pulmonary effort is normal.     Breath sounds: Normal breath sounds.  Abdominal:     General: There is no distension.     Palpations: Abdomen is soft.     Tenderness: There is no abdominal tenderness.  Lymphadenopathy:     Cervical: No cervical adenopathy.  Skin:    General: Skin is warm and dry.     Capillary Refill: Capillary refill takes less than 2 seconds.     Findings: No rash.          Comments: Ruptured abscess under panus. Small bloody drainage from opening. Tenderness with attempt to express but no purulence detected. No induration or erythema.   Neurological:     Mental Status: She is alert and oriented to person, place, and time.  Psychiatric:        Attention and Perception: Attention and perception normal.        Mood and Affect: Mood is anxious. Affect is tearful.        Speech: Speech normal.        Behavior: Behavior normal.        Thought Content: Thought content normal.        Cognition and Memory: Cognition normal.        Judgment: Judgment normal.     Comments: In good spirits today overall and  motivated to get herself better.       HIV 1 RNA Quant (copies/mL)  Date Value  12/26/2018 170 (H)  07/28/2017 66 (H)  07/08/2016 7,518 (H)   CD4 T Cell Abs (/uL)  Date Value  08/10/2019 535  12/26/2018 390 (L)  07/28/2017 510    Lab Results  Component Value Date   CREATININE 0.97 08/10/2019   CREATININE 0.98 12/26/2018   CREATININE 0.74 01/03/2018    Lab Results  Component Value Date   WBC 5.8 08/10/2019   HGB 13.1 08/10/2019   HCT 39.2 08/10/2019   MCV 94.2 08/10/2019   PLT 212 08/10/2019    Lab Results  Component Value Date   ALT  15 08/10/2019   AST 11 08/10/2019   ALKPHOS 85 09/23/2017   BILITOT 0.4 08/10/2019     Assessment and Plan:  Problem List Items Addressed This Visit      High   HTN (hypertension) (Chronic)    Managed by her primary team. Out of target today in this diabetic patient. provided refills that she needed today and encouraged her to continue taking her medications as prescribed especially prior to health care visits so they can be safely adjusted.       HIV disease ([place]) - Primary (Chronic)    Psalms has had some trouble with adherence in the past due to social/psychological stressors. VL was 170 in March 2020 with CD4 390 indicating she may have been taking her medications at least partially.  Will update her labs today. She likes the change to [place] and will continue this for her. No signs of OI's on exam or history.  Flu shot given today.  Pap to be scheduled in 2 months at return visit.        Relevant Orders   HIV-1 RNA quant-no reflex-bld   T-helper cell (CD4)- ([facility] clinic only) (Completed)   Type 1 diabetes mellitus with hyperglycemia ([place])    I will provide refills for her Novolog (she will resume her sliding scale insulin), oral antidiabetic agents and monitoring supplies to bridge her until she gets to see her PCP. Will check A1C and chemistry today.      Relevant Medications   glucose blood  (ACCU-CHEK AVIVA PLUS) test strip   insulin aspart (NOVOLOG) 100 UNIT/ML FlexPen   sitaGLIPtin-metformin (JANUMET) 50-1000 MG tablet   blood glucose meter kit and supplies KIT   Other Relevant Orders   Hemoglobin A1c (Completed)   Comp Met (CMET) (Completed)   CBC with Differential/Platelet (Completed)     Medium   Obesity    Wt Readings from Last 3 Encounters:  08/10/19 275 lb (124.7 kg)  01/12/18 282 lb 6.4 oz (128.1 kg)  01/04/18 275 lb (124.7 kg)   Weight is stable since 2019 however morbidly obese. Weight loss will be a positive impact for her to help with her diabetes, blood pressure and overall risk for cardiovascular event. Will continue to provide support and assistance for her as needed. She would likely do well on a carbohydrate restricted diet, however given her insulin dependency this will need to be supervised by her prescribing provider to ensure no hypoglycemic events.       Relevant Medications   insulin aspart (NOVOLOG) 100 UNIT/ML FlexPen   sitaGLIPtin-metformin (JANUMET) 50-1000 MG tablet     Unprioritized   Anxiety disorder (Chronic)    Working closely with psychiatrist - appreciate care. Glad to hear Laurren is prioritizing this as it impacts so much of her overall health and ability to provide self-care. Support provided.        Other Visit Diagnoses    Need for immunization against influenza       Relevant Orders   Flu Vaccine QUAD 36+ mos IM (Completed)      Janene Madeira, MSN, NP-C [facility] [email or web address] Pager: [phone] Office: [place]: [phone]

## 2019-08-10 NOTE — Patient Instructions (Signed)
So nice to meet you in person!  Please continue your Tivicay and Descovy every day like you are.   Will repeat your blood work and release the results to your MyChart once available. Will check your diabetes labs also.   I will refill your insulin, diabetes pills and supplies to bridge you until you can get in to see your primary care provider.    So proud of you for working with your psychiatrist! I agree with you that this is very important.   We gave you your flu shot today.   The abscess on your stomach   Please schedule an office visit in 2 months for routine care and a pap smear (30 minute appointment).   Looking forward to seeing you again.    Try some Stevia sweetner in your coffee instead of sugar. It will not bump your blood sugars.

## 2019-08-11 ENCOUNTER — Telehealth: Payer: Self-pay

## 2019-08-11 ENCOUNTER — Encounter: Payer: Self-pay | Admitting: Infectious Diseases

## 2019-08-11 LAB — T-HELPER CELL (CD4) - (RCID CLINIC ONLY)
CD4 % Helper T Cell: 27 % — ABNORMAL LOW (ref 33–65)
CD4 T Cell Abs: 535 /uL (ref 400–1790)

## 2019-08-11 NOTE — Telephone Encounter (Signed)
Thank you - I refilled Sherry Hall's insulin to bridge her to an appointment with her PCP as well as her monitoring supplies.

## 2019-08-11 NOTE — Assessment & Plan Note (Signed)
Sherry Hall has had some trouble with adherence in the past due to social/psychological stressors. VL was 170 in March 2020 with CD4 390 indicating she may have been taking her medications at least partially.  Will update her labs today. She likes the change to [place] and will continue this for her. No signs of OI's on exam or history.  Flu shot given today.  Pap to be scheduled in 2 months at return visit.

## 2019-08-11 NOTE — Assessment & Plan Note (Signed)
Managed by her primary team. Out of target today in this diabetic patient. provided refills that she needed today and encouraged her to continue taking her medications as prescribed especially prior to health care visits so they can be safely adjusted.

## 2019-08-11 NOTE — Assessment & Plan Note (Signed)
Working closely with psychiatrist - appreciate care. Glad to hear Viviene is prioritizing this as it impacts so much of her overall health and ability to provide self-care. Support provided.

## 2019-08-11 NOTE — Telephone Encounter (Signed)
[facility] called office stating patient's glucose was 423 on last lab draw. Will forward message to provider so she is aware. [place]

## 2019-08-11 NOTE — Assessment & Plan Note (Signed)
I will provide refills for her Novolog (she will resume her sliding scale insulin), oral antidiabetic agents and monitoring supplies to bridge her until she gets to see her PCP. Will check A1C and chemistry today.

## 2019-08-11 NOTE — Assessment & Plan Note (Signed)
Wt Readings from Last 3 Encounters:  08/10/19 275 lb (124.7 kg)  01/12/18 282 lb 6.4 oz (128.1 kg)  01/04/18 275 lb (124.7 kg)   Weight is stable since 2019 however morbidly obese. Weight loss will be a positive impact for her to help with her diabetes, blood pressure and overall risk for cardiovascular event. Will continue to provide support and assistance for her as needed. She would likely do well on a carbohydrate restricted diet, however given her insulin dependency this will need to be supervised by her prescribing provider to ensure no hypoglycemic events.

## 2019-08-14 LAB — CBC WITH DIFFERENTIAL/PLATELET
Absolute Monocytes: 360 cells/uL (ref 200–950)
Basophils Absolute: 52 cells/uL (ref 0–200)
Basophils Relative: 0.9 %
Eosinophils Absolute: 151 cells/uL (ref 15–500)
Eosinophils Relative: 2.6 %
HCT: 39.2 % (ref 35.0–45.0)
Hemoglobin: 13.1 g/dL (ref 11.7–15.5)
Lymphs Abs: 2152 cells/uL (ref 850–3900)
MCH: 31.5 pg (ref 27.0–33.0)
MCHC: 33.4 g/dL (ref 32.0–36.0)
MCV: 94.2 fL (ref 80.0–100.0)
MPV: 11.6 fL (ref 7.5–12.5)
Monocytes Relative: 6.2 %
Neutro Abs: 3086 cells/uL (ref 1500–7800)
Neutrophils Relative %: 53.2 %
Platelets: 212 10*3/uL (ref 140–400)
RBC: 4.16 10*6/uL (ref 3.80–5.10)
RDW: 12.1 % (ref 11.0–15.0)
Total Lymphocyte: 37.1 %
WBC: 5.8 10*3/uL (ref 3.8–10.8)

## 2019-08-14 LAB — COMPREHENSIVE METABOLIC PANEL
AG Ratio: 0.9 (calc) — ABNORMAL LOW (ref 1.0–2.5)
ALT: 15 U/L (ref 6–29)
AST: 11 U/L (ref 10–30)
Albumin: 3.6 g/dL (ref 3.6–5.1)
Alkaline phosphatase (APISO): 69 U/L (ref 31–125)
BUN: 15 mg/dL (ref 7–25)
CO2: 23 mmol/L (ref 20–32)
Calcium: 9.1 mg/dL (ref 8.6–10.2)
Chloride: 100 mmol/L (ref 98–110)
Creat: 0.97 mg/dL (ref 0.50–1.10)
Globulin: 4.1 g/dL (calc) — ABNORMAL HIGH (ref 1.9–3.7)
Glucose, Bld: 423 mg/dL — ABNORMAL HIGH (ref 65–99)
Potassium: 4.2 mmol/L (ref 3.5–5.3)
Sodium: 133 mmol/L — ABNORMAL LOW (ref 135–146)
Total Bilirubin: 0.4 mg/dL (ref 0.2–1.2)
Total Protein: 7.7 g/dL (ref 6.1–8.1)

## 2019-08-14 LAB — HEMOGLOBIN A1C
Hgb A1c MFr Bld: 9.9 % of total Hgb — ABNORMAL HIGH (ref <5.7)
Mean Plasma Glucose: 237 (calc)
eAG (mmol/L): 13.2 (calc)

## 2019-08-14 LAB — HIV-1 RNA QUANT-NO REFLEX-BLD
HIV 1 RNA Quant: 836 copies/mL — ABNORMAL HIGH
HIV-1 RNA Quant, Log: 2.92 Log copies/mL — ABNORMAL HIGH

## 2019-08-21 ENCOUNTER — Telehealth: Payer: Self-pay

## 2019-08-21 ENCOUNTER — Other Ambulatory Visit: Payer: Self-pay | Admitting: Family

## 2019-08-21 ENCOUNTER — Other Ambulatory Visit: Payer: Self-pay

## 2019-08-21 DIAGNOSIS — E1065 Type 1 diabetes mellitus with hyperglycemia: Secondary | ICD-10-CM

## 2019-08-21 MED ORDER — JANUMET 50-1000 MG PO TABS: ORAL_TABLET | ORAL | 0 refills | Status: DC

## 2019-08-21 MED ORDER — INSULIN ASPART 100 UNIT/ML FLEXPEN: PEN_INJECTOR | SUBCUTANEOUS | 0 refills | Status: DC

## 2019-08-21 MED ORDER — ACCU-CHEK AVIVA PLUS VI STRP: 1.0000 | ORAL_STRIP | Freq: Four times a day (QID) | 5 refills | Status: DC

## 2019-08-21 MED ORDER — BLOOD GLUCOSE MONITOR KIT: PACK | 0 refills | Status: DC

## 2019-08-21 NOTE — Telephone Encounter (Signed)
Sherry Hall called office today requesting her glucose meter and supplies be sent to [facility] on [address] Terri Piedra, FNP will sign prescription on Stephanie's behalf. Patient understands this is only to bridge her until she is seen by PCP. [place]

## 2019-08-24 ENCOUNTER — Other Ambulatory Visit: Payer: Self-pay

## 2019-08-24 ENCOUNTER — Telehealth: Payer: Self-pay

## 2019-08-24 DIAGNOSIS — E1065 Type 1 diabetes mellitus with hyperglycemia: Secondary | ICD-10-CM

## 2019-08-24 MED ORDER — ACCU-CHEK AVIVA PLUS VI STRP: 1.0000 | ORAL_STRIP | Freq: Four times a day (QID) | 5 refills | Status: DC

## 2019-08-24 MED ORDER — ACCU-CHEK AVIVA PLUS W/DEVICE KIT: 1.0000 | PACK | Freq: Four times a day (QID) | 0 refills | Status: DC

## 2019-08-24 NOTE — Telephone Encounter (Signed)
Received call from pharmacy requesting new RX due to ICD9 codes. LPN contacted PCP office and they agreed to refill and manage Glucose meter with test strips.  Sherry Hall

## 2019-08-25 ENCOUNTER — Other Ambulatory Visit: Payer: Self-pay | Admitting: Infectious Diseases

## 2019-08-25 DIAGNOSIS — E1065 Type 1 diabetes mellitus with hyperglycemia: Secondary | ICD-10-CM

## 2019-08-25 MED ORDER — INSULIN ASPART 100 UNIT/ML FLEXPEN: PEN_INJECTOR | SUBCUTANEOUS | 0 refills | Status: DC

## 2019-08-28 ENCOUNTER — Other Ambulatory Visit: Payer: Self-pay

## 2019-08-28 DIAGNOSIS — E1065 Type 1 diabetes mellitus with hyperglycemia: Secondary | ICD-10-CM

## 2019-08-28 MED ORDER — ACCU-CHEK AVIVA PLUS W/DEVICE KIT: 1.0000 | PACK | Freq: Four times a day (QID) | 0 refills | Status: DC

## 2019-08-28 MED ORDER — ACCU-CHEK AVIVA PLUS VI STRP: 1.0000 | ORAL_STRIP | Freq: Four times a day (QID) | 5 refills | Status: DC

## 2019-08-30 ENCOUNTER — Other Ambulatory Visit: Payer: Self-pay | Admitting: Family Medicine

## 2019-09-04 ENCOUNTER — Other Ambulatory Visit: Payer: Self-pay

## 2019-09-04 MED ORDER — ACCU-CHEK AVIVA PLUS W/DEVICE KIT: 1.0000 | PACK | Freq: Four times a day (QID) | 0 refills | Status: DC

## 2019-10-10 ENCOUNTER — Ambulatory Visit (INDEPENDENT_AMBULATORY_CARE_PROVIDER_SITE_OTHER): Payer: Medicare Other | Admitting: Family Medicine

## 2019-10-10 ENCOUNTER — Other Ambulatory Visit: Payer: Self-pay

## 2019-10-10 ENCOUNTER — Encounter: Payer: Self-pay | Admitting: Family Medicine

## 2019-10-10 VITALS — BP 137/73 | HR 88 | Temp 98.0°F | Ht 60.0 in | Wt 274.6 lb

## 2019-10-10 DIAGNOSIS — E118 Type 2 diabetes mellitus with unspecified complications: Secondary | ICD-10-CM

## 2019-10-10 DIAGNOSIS — B20 Human immunodeficiency virus [HIV] disease: Secondary | ICD-10-CM | POA: Diagnosis not present

## 2019-10-10 DIAGNOSIS — Z794 Long term (current) use of insulin: Secondary | ICD-10-CM

## 2019-10-10 DIAGNOSIS — E119 Type 2 diabetes mellitus without complications: Secondary | ICD-10-CM | POA: Diagnosis not present

## 2019-10-10 DIAGNOSIS — L02211 Cutaneous abscess of abdominal wall: Secondary | ICD-10-CM

## 2019-10-10 DIAGNOSIS — I10 Essential (primary) hypertension: Secondary | ICD-10-CM

## 2019-10-10 DIAGNOSIS — B3731 Acute candidiasis of vulva and vagina: Secondary | ICD-10-CM

## 2019-10-10 DIAGNOSIS — B373 Candidiasis of vulva and vagina: Secondary | ICD-10-CM

## 2019-10-10 LAB — POCT URINALYSIS DIPSTICK
Glucose, UA: POSITIVE — AB
Leukocytes, UA: NEGATIVE
Nitrite, UA: NEGATIVE
Protein, UA: POSITIVE — AB
Spec Grav, UA: >=1.03 — AB (ref 1.010–1.025)
Urobilinogen, UA: 1 E.U./dL
pH, UA: 5.5 (ref 5.0–8.0)

## 2019-10-10 LAB — POCT GLYCOSYLATED HEMOGLOBIN (HGB A1C): Hemoglobin A1C: 10.7 % — AB (ref 4.0–5.6)

## 2019-10-10 LAB — GLUCOSE, POCT (MANUAL RESULT ENTRY): POC Glucose: 260 mg/dl — AB (ref 70–99)

## 2019-10-10 MED ORDER — LISINOPRIL-HYDROCHLOROTHIAZIDE 20-25 MG PO TABS: 1.0000 | ORAL_TABLET | Freq: Every day | ORAL | 5 refills | Status: DC

## 2019-10-10 MED ORDER — JANUMET 50-1000 MG PO TABS: ORAL_TABLET | ORAL | 6 refills | Status: DC

## 2019-10-10 MED ORDER — LANTUS SOLOSTAR 100 UNIT/ML ~~LOC~~ SOPN: PEN_INJECTOR | SUBCUTANEOUS | 11 refills | Status: DC

## 2019-10-10 MED ORDER — ACCU-CHEK AVIVA PLUS W/DEVICE KIT: 1.0000 | PACK | Freq: Four times a day (QID) | 0 refills | Status: DC

## 2019-10-10 MED ORDER — PROPRANOLOL HCL 10 MG PO TABS: 10.0000 mg | ORAL_TABLET | Freq: Two times a day (BID) | ORAL | 5 refills | Status: DC

## 2019-10-10 MED ORDER — INSULIN ASPART 100 UNIT/ML FLEXPEN: PEN_INJECTOR | SUBCUTANEOUS | 11 refills | Status: DC

## 2019-10-10 MED ORDER — ACCU-CHEK AVIVA PLUS VI STRP: 1.0000 | ORAL_STRIP | Freq: Four times a day (QID) | 11 refills | Status: DC

## 2019-10-10 MED ORDER — DOXYCYCLINE HYCLATE 100 MG PO TABS: 100.0000 mg | ORAL_TABLET | Freq: Two times a day (BID) | ORAL | 0 refills | Status: DC

## 2019-10-10 MED ORDER — FLUCONAZOLE 150 MG PO TABS: 150.0000 mg | ORAL_TABLET | Freq: Once | ORAL | 0 refills | Status: AC

## 2019-10-10 NOTE — Progress Notes (Signed)
[facility]   Established Patient Office Visit  Subjective:  Patient ID: Sherry Hall, female    DOB: 11/10/1980  Age: 38 y.o. MRN: [id number]  CC:  Chief Complaint  Patient presents with  . Follow-up    DM    [name], a 38 year old female with a medical history significant for type 2 diabetes mellitus, hypertension, GERD, HIV, depression and anxiety, and morbid obesity presents for follow-up of chronic conditions.  Patient says that she has been lost to follow-up in primary care over the past several months.  However, she says that she has been following with infectious disease provider consistently. Joshua is complaining of an abscess underneath abdominal folds, which has been there for greater than 1 month. She was treated with antibiotic by ID several weeks ago, but continues to have drainage. She characterized abscess as little drainage with a bad odor.   Diabetes She presents for her follow-up (Patient says that she has been unable to check blood glucose consistently being that her glucometer is broken.  So, she has been unable to use sliding scale insulin.  She is only been on long-acting.) diabetic visit. She has type 2 diabetes mellitus. Hypoglycemia symptoms include nervousness/anxiousness. Pertinent negatives for hypoglycemia include no confusion or dizziness. Associated symptoms include fatigue and foot paresthesias. Pertinent negatives for diabetes include no blurred vision, no chest pain, no foot ulcerations, no polydipsia, no polyphagia, no polyuria, no visual change, no weakness and no weight loss. Diabetic complications include peripheral neuropathy. Pertinent negatives for diabetic complications include no CVA. Risk factors for coronary artery disease include obesity, diabetes mellitus, sedentary lifestyle and tobacco exposure. She is following a generally unhealthy diet. When asked about meal planning, she  reported none. She never participates in exercise. She does not see a podiatrist.Eye exam is not current.  Hypertension This is a chronic problem. The current episode started more than 1 year ago. Progression since onset: [name] does not check blood sugars at home. Associated symptoms include anxiety. Pertinent negatives include no blurred vision, chest pain or shortness of breath. There is no history of kidney disease or CVA. There is no history of chronic renal disease or a hypertension causing med.  Anxiety Presents for follow-up ([name] has a long history of anxiety. She has a number of stressors, including raising one child with autism and another with ADHD. She and her partner are also living with HIV. [name] says that she has had med changes with HIV, which is "scary". ) visit. Symptoms include decreased concentration, excessive worry, insomnia, irritability and nervous/anxious behavior. Patient reports no chest pain, confusion, dizziness, shortness of breath or suicidal ideas. The severity of symptoms is moderate. The quality of sleep is poor.   Compliance with medications is 26-50%.  Vaginal Discharge The patient's primary symptoms include genital itching and vaginal discharge. The patient's pertinent negatives include no genital lesions, genital odor, genital rash, missed menses, pelvic pain or vaginal bleeding. This is a recurrent problem. The problem occurs intermittently. The problem affects both sides. She is not pregnant. Pertinent negatives include no dysuria, fever, flank pain or painful intercourse. The vaginal discharge was thick and white. She has tried nothing for the symptoms.    Past Medical History:  Diagnosis Date  . Anemia    H/O  . Anxiety   . Depression   . Diabetes mellitus type 1 ([place])   . GERD (gastroesophageal reflux disease)   . HIV (human immunodeficiency virus infection) ([place])  2011  . Hypertension     Past Surgical History:  Procedure Laterality  Date  . CESAREAN SECTION  2001, 2008, 2009, 2011  . CHOLECYSTECTOMY    . EXCISION OF BREAST LESION N/A 01/04/2018   Procedure: EXCISION OF CHEST WALL ABSCESS. MIDDLE CHEST;  Surgeon: Olean Ree, MD;  Location: [facility] ORS;  Service: General;  Laterality: N/A;  resection of chest wall cyst / abscess  . FRACTURE SURGERY Right    HAND  . TONSILLECTOMY    . TUBAL LIGATION  2011    Family History  Problem Relation Age of Onset  . Diabetes Father   . Hypertension Father   . Cancer Mother        cervical     Social History   Socioeconomic History  . Marital status: Single    Spouse name: Not on file  . Number of children: Not on file  . Years of education: Not on file  . Highest education level: Not on file  Occupational History  . Not on file  Tobacco Use  . Smoking status: Current Every Day Smoker    Packs/day: 0.50    Years: 15.00    Pack years: 7.50    Types: Cigarettes  . Smokeless tobacco: Never Used  . Tobacco comment: trying to slow  Substance and Sexual Activity  . Alcohol use: Yes    Alcohol/week: 1.0 standard drinks    Types: 1 Standard drinks or equivalent per week    Comment: socially  . Drug use: No  . Sexual activity: Yes    Partners: Male    Birth control/protection: Surgical  Other Topics Concern  . Not on file  Social History Narrative  . Not on file   Social Determinants of Health   Financial Resource Strain:   . Difficulty of Paying Living Expenses: Not on file  Food Insecurity:   . Worried About Charity fundraiser in the Last Year: Not on file  . Ran Out of Food in the Last Year: Not on file  Transportation Needs:   . Lack of Transportation (Medical): Not on file  . Lack of Transportation (Non-Medical): Not on file  Physical Activity:   . Days of Exercise per Week: Not on file  . Minutes of Exercise per Session: Not on file  Stress:   . Feeling of Stress : Not on file  Social Connections:   . Frequency of Communication with Friends and  Family: Not on file  . Frequency of Social Gatherings with Friends and Family: Not on file  . Attends Religious Services: Not on file  . Active Member of Clubs or Organizations: Not on file  . Attends Archivist Meetings: Not on file  . Marital Status: Not on file  Intimate Partner Violence:   . Fear of Current or Ex-Partner: Not on file  . Emotionally Abused: Not on file  . Physically Abused: Not on file  . Sexually Abused: Not on file    Outpatient Medications Prior to Visit  Medication Sig Dispense Refill  . ACCU-CHEK SOFTCLIX LANCETS lancets USE AS INSTRUCTED 4 times daily 100 each 12  . ALPRAZolam (XANAX) 1 MG tablet     . ARIPiprazole (ABILIFY) 10 MG tablet     . atorvastatin (LIPITOR) 10 MG tablet TAKE 1 TABLET (10 MG TOTAL) BY MOUTH DAILY. (Patient taking differently: Take 10 mg by mouth daily at 6 PM. ) 90 tablet 3  . blood glucose meter kit and supplies KIT Dispense  based on patient and insurance preference. Use up to four times daily as directed. (FOR ICD-9 250.00, 250.01). 1 each 0  . Blood Glucose Monitoring Suppl (ACCU-CHEK AVIVA PLUS) w/Device KIT 1 each by Does not apply route 4 (four) times daily. *DX Code E10.65* 1 kit 0  . busPIRone (BUSPAR) 10 MG tablet     . DESCOVY 200-25 MG tablet TAKE ONE TABLET BY MOUTH DAILY 30 tablet 0  . GLOBAL EASE INJECT PEN NEEDLES 32G X 4 MM MISC USE AS DIRECTED TO ADMINISTER INSULIN THREE TIMES DAILY BEFORE MEALS 100 each 9  . glucose blood (ACCU-CHEK AVIVA PLUS) test strip 1 each by Other route 4 (four) times daily. Use as instructed. DX code E10.65 100 each 5  . HYDROcodone-acetaminophen (NORCO/VICODIN) 5-325 MG tablet Take 1 tablet by mouth every 4 (four) hours as needed. 6 tablet 0  . insulin aspart (NOVOLOG) 100 UNIT/ML FlexPen Per sliding scale. Max daily dose 40 units. 15 mL 0  . LANTUS SOLOSTAR 100 UNIT/ML Solostar Pen INJECT 30 UNITS INTO THE SKIN DAILY AT 10 IN THE EVENING 15 mL 0  . lisinopril-hydrochlorothiazide  (PRINZIDE,ZESTORETIC) 20-25 MG tablet TAKE ONE TABLET BY MOUTH DAILY 30 tablet 0  . ondansetron (ZOFRAN) 4 MG tablet Take 4 mg by mouth every 8 (eight) hours as needed for nausea or vomiting.    . propranolol (INDERAL) 10 MG tablet Take 10 mg by mouth 2 (two) times daily.    . sertraline (ZOLOFT) 50 MG tablet     . sitaGLIPtin-metformin (JANUMET) 50-1000 MG tablet TAKE 1 TABLET BY MOUTH TWO (TWO) TIMES DAILY WITH A MEAL. 60 tablet 0  . TIVICAY 50 MG tablet TAKE ONE TABLET BY MOUTH DAILY 30 tablet 0  . traZODone (DESYREL) 50 MG tablet Take 50-100 mg by mouth at bedtime as needed for sleep.   1   No facility-administered medications prior to visit.    Allergies  Allergen Reactions  . Latex Rash  . Percocet [Oxycodone-Acetaminophen] Hives, Itching and Rash    ROS Review of Systems  Constitutional: Positive for fatigue and irritability. Negative for fever and weight loss.  Eyes: Negative for blurred vision.  Respiratory: Negative.  Negative for shortness of breath.   Cardiovascular: Negative for chest pain.  Endocrine: Negative.  Negative for polydipsia, polyphagia and polyuria.  Genitourinary: Positive for vaginal discharge. Negative for dysuria, flank pain, missed menses and pelvic pain.  Neurological: Negative for dizziness and weakness.  Psychiatric/Behavioral: Positive for decreased concentration. Negative for confusion and suicidal ideas. The patient is nervous/anxious and has insomnia.       Objective:    Physical Exam  Constitutional: She is oriented to person, place, and time. She appears well-developed.  HENT:  Head: Normocephalic and atraumatic.  Eyes: Pupils are equal, round, and reactive to light.  Cardiovascular: Normal rate and regular rhythm.  Pulmonary/Chest: Effort normal and breath sounds normal.  Abdominal: Soft. Bowel sounds are normal.  Musculoskeletal:        General: Normal range of motion.     Cervical back: Normal range of motion.  Neurological: She is  alert and oriented to person, place, and time.  Skin: Skin is warm.  Scant yellow drainage, unable to determine size of abscess, induration, putrid odor  Abdominal folds moist.   Psychiatric: She has a normal mood and affect. Her behavior is normal. Judgment and thought content normal.    Ht 5' (1.524 m)   Wt 274 lb 9.6 oz (124.6 kg)   BMI 53.63  kg/m  Wt Readings from Last 3 Encounters:  10/10/19 274 lb 9.6 oz (124.6 kg)  08/10/19 275 lb (124.7 kg)  01/12/18 282 lb 6.4 oz (128.1 kg)     Health Maintenance Due  Topic Date Due  . PAP SMEAR-Modifier  08/29/2000  . OPHTHALMOLOGY EXAM  04/06/2017  . FOOT EXAM  06/05/2017    There are no preventive care reminders to display for this patient.  Lab Results  Component Value Date   TSH 1.42 07/08/2016   Lab Results  Component Value Date   WBC 5.8 08/10/2019   HGB 13.1 08/10/2019   HCT 39.2 08/10/2019   MCV 94.2 08/10/2019   PLT 212 08/10/2019   Lab Results  Component Value Date   NA 133 (L) 08/10/2019   K 4.2 08/10/2019   CO2 23 08/10/2019   GLUCOSE 423 (H) 08/10/2019   BUN 15 08/10/2019   CREATININE 0.97 08/10/2019   BILITOT 0.4 08/10/2019   ALKPHOS 85 09/23/2017   AST 11 08/10/2019   ALT 15 08/10/2019   PROT 7.7 08/10/2019   ALBUMIN 3.6 09/23/2017   CALCIUM 9.1 08/10/2019   ANIONGAP 7 01/03/2018   Lab Results  Component Value Date   CHOL 235 (H) 12/26/2018   Lab Results  Component Value Date   HDL 51 12/26/2018   Lab Results  Component Value Date   LDLCALC 147 (H) 12/26/2018   Lab Results  Component Value Date   TRIG 230 (H) 12/26/2018   Lab Results  Component Value Date   CHOLHDL 4.6 12/26/2018   Lab Results  Component Value Date   HGBA1C 9.9 (H) 08/10/2019      Assessment & Plan:   Problem List Items Addressed This Visit      Cardiovascular and Mediastinum   HTN (hypertension) (Chronic)     Other   HIV disease (HCC) (Chronic)    Other Visit Diagnoses    Type 2 diabetes mellitus  with complication, with long-term current use of insulin ([place])    -  Primary   Relevant Orders   Urinalysis Dipstick   Glucose (CBG)     1. Type 2 diabetes mellitus with complication, with long-term current use of insulin (HCC) Discussed medication regimen at length. Also, discussed the fact that it is important to follow medication regimen as prescribed in order to achieve positive outcomes. Hemoglobin a1C is elevated at 10.7, discussed with patient.  - Urinalysis Dipstick - Glucose (CBG) - HgB A1c - sitaGLIPtin-metformin (JANUMET) 50-1000 MG tablet; TAKE 1 TABLET BY MOUTH TWO (TWO) TIMES DAILY WITH A MEAL.  Dispense: 60 tablet; Refill: 6 - insulin aspart (NOVOLOG) 100 UNIT/ML FlexPen; Per sliding scale. Max daily dose 40 units.  Dispense: 15 mL; Refill: 11 - Basic Metabolic Panel  2. HIV disease ([place]) Follow up with infectious disease as scheduled. Discussed the fact that it is important to take medications consistently.  3. Essential hypertension - Continue medication, monitor blood pressure at home. Continue DASH diet. Reminder to go to the ER if any CP, SOB, nausea, dizziness, severe HA, changes vision/speech, left arm numbness and tingling and jaw pain.    - lisinopril-hydrochlorothiazide (ZESTORETIC) 20-25 MG tablet; Take 1 tablet by mouth daily.  Dispense: 30 tablet; Refill: 5 - propranolol (INDERAL) 10 MG tablet; Take 1 tablet (10 mg total) by mouth 2 (two) times daily.  Dispense: 60 tablet; Refill: 5 - Basic Metabolic Panel  4. Type 2 diabetes mellitus without complication, with long-term current use of insulin ([place])  -  Insulin Glargine (LANTUS SOLOSTAR) 100 UNIT/ML Solostar Pen; INJECT 30 UNITS INTO THE SKIN DAILY AT 10 IN THE EVENING  Dispense: 15 mL; Refill: 11  5. Abscess of skin of abdomen - doxycycline (VIBRA-TABS) 100 MG tablet; Take 1 tablet (100 mg total) by mouth 2 (two) times daily.  Dispense: 20 tablet; Refill: 0  6. Vaginal candida - fluconazole (DIFLUCAN)  150 MG tablet; Take 1 tablet (150 mg total) by mouth once for 1 dose.  Dispense: 2 tablet; Refill: 0  Follow-up:   3 month follow up for chronic conditions.    Donia Pounds  APRN, MSN, FNP-C [facility] [address] [place], [place] [address] [phone]

## 2019-10-11 ENCOUNTER — Other Ambulatory Visit: Payer: Self-pay | Admitting: Family Medicine

## 2019-10-11 DIAGNOSIS — Z794 Long term (current) use of insulin: Secondary | ICD-10-CM

## 2019-10-11 DIAGNOSIS — E118 Type 2 diabetes mellitus with unspecified complications: Secondary | ICD-10-CM

## 2019-10-11 LAB — BASIC METABOLIC PANEL
BUN/Creatinine Ratio: 16 (ref 9–23)
BUN: 13 mg/dL (ref 6–24)
CO2: 22 mmol/L (ref 20–29)
Calcium: 9.1 mg/dL (ref 8.7–10.2)
Chloride: 103 mmol/L (ref 96–106)
Creatinine, Ser: 0.8 mg/dL (ref 0.57–1.00)
GFR calc Af Amer: 107 mL/min/{1.73_m2} (ref >59)
GFR calc non Af Amer: 93 mL/min/{1.73_m2} (ref >59)
Glucose: 182 mg/dL — ABNORMAL HIGH (ref 65–99)
Potassium: 4 mmol/L (ref 3.5–5.2)
Sodium: 135 mmol/L (ref 134–144)

## 2019-10-11 MED ORDER — ACCU-CHEK FASTCLIX LANCETS MISC: 1.0000 [IU] | Freq: Four times a day (QID) | 12 refills | Status: DC | PRN

## 2019-10-11 MED ORDER — ACCU-CHEK GUIDE VI STRP: ORAL_STRIP | 12 refills | Status: DC

## 2019-10-11 MED ORDER — ACCU-CHEK GUIDE ME W/DEVICE KIT: 1.0000 [IU] | PACK | 0 refills | Status: DC | PRN

## 2019-10-11 MED ORDER — ACCU-CHEK GUIDE ME W/DEVICE KIT: 1.0000 [IU] | PACK | Freq: Four times a day (QID) | 0 refills | Status: DC | PRN

## 2019-10-16 ENCOUNTER — Other Ambulatory Visit: Payer: Self-pay | Admitting: Family Medicine

## 2019-10-16 DIAGNOSIS — Z794 Long term (current) use of insulin: Secondary | ICD-10-CM

## 2019-10-16 DIAGNOSIS — E118 Type 2 diabetes mellitus with unspecified complications: Secondary | ICD-10-CM

## 2019-10-16 MED ORDER — ACCU-CHEK GUIDE VI STRP: ORAL_STRIP | 12 refills | Status: DC

## 2019-10-17 ENCOUNTER — Other Ambulatory Visit: Payer: Self-pay | Admitting: Family Medicine

## 2019-10-17 ENCOUNTER — Encounter: Payer: Self-pay | Admitting: Family Medicine

## 2019-10-17 DIAGNOSIS — Z794 Long term (current) use of insulin: Secondary | ICD-10-CM

## 2019-10-17 DIAGNOSIS — E118 Type 2 diabetes mellitus with unspecified complications: Secondary | ICD-10-CM

## 2019-10-17 MED ORDER — ACCU-CHEK GUIDE VI STRP: ORAL_STRIP | 12 refills | Status: DC

## 2019-10-19 ENCOUNTER — Telehealth: Payer: Self-pay

## 2019-10-19 NOTE — Telephone Encounter (Signed)
I called the pharmacy to check to see if Sherry Hall was able get her DM supplies. Per Pharmacist everything is available for pick up. She will have to return to pay about $7 for the meter.  Pharmacy has already spoken to the patient. ( I tried to reach her also but voice mail full).

## 2019-10-24 ENCOUNTER — Other Ambulatory Visit: Payer: Self-pay | Admitting: Infectious Diseases

## 2019-10-24 DIAGNOSIS — E118 Type 2 diabetes mellitus with unspecified complications: Secondary | ICD-10-CM

## 2019-10-30 ENCOUNTER — Ambulatory Visit: Payer: Medicare Other | Admitting: Infectious Diseases

## 2019-11-27 IMAGING — CR DG KNEE COMPLETE 4+V*R*
4 series · 4 of 4 positions shown · non-contrast
Comparison: None.

CLINICAL DATA: Acute right knee pain following fall yesterday.
Initial encounter.

EXAM:
RIGHT KNEE - COMPLETE 4+ VIEW

[t knee ap right]
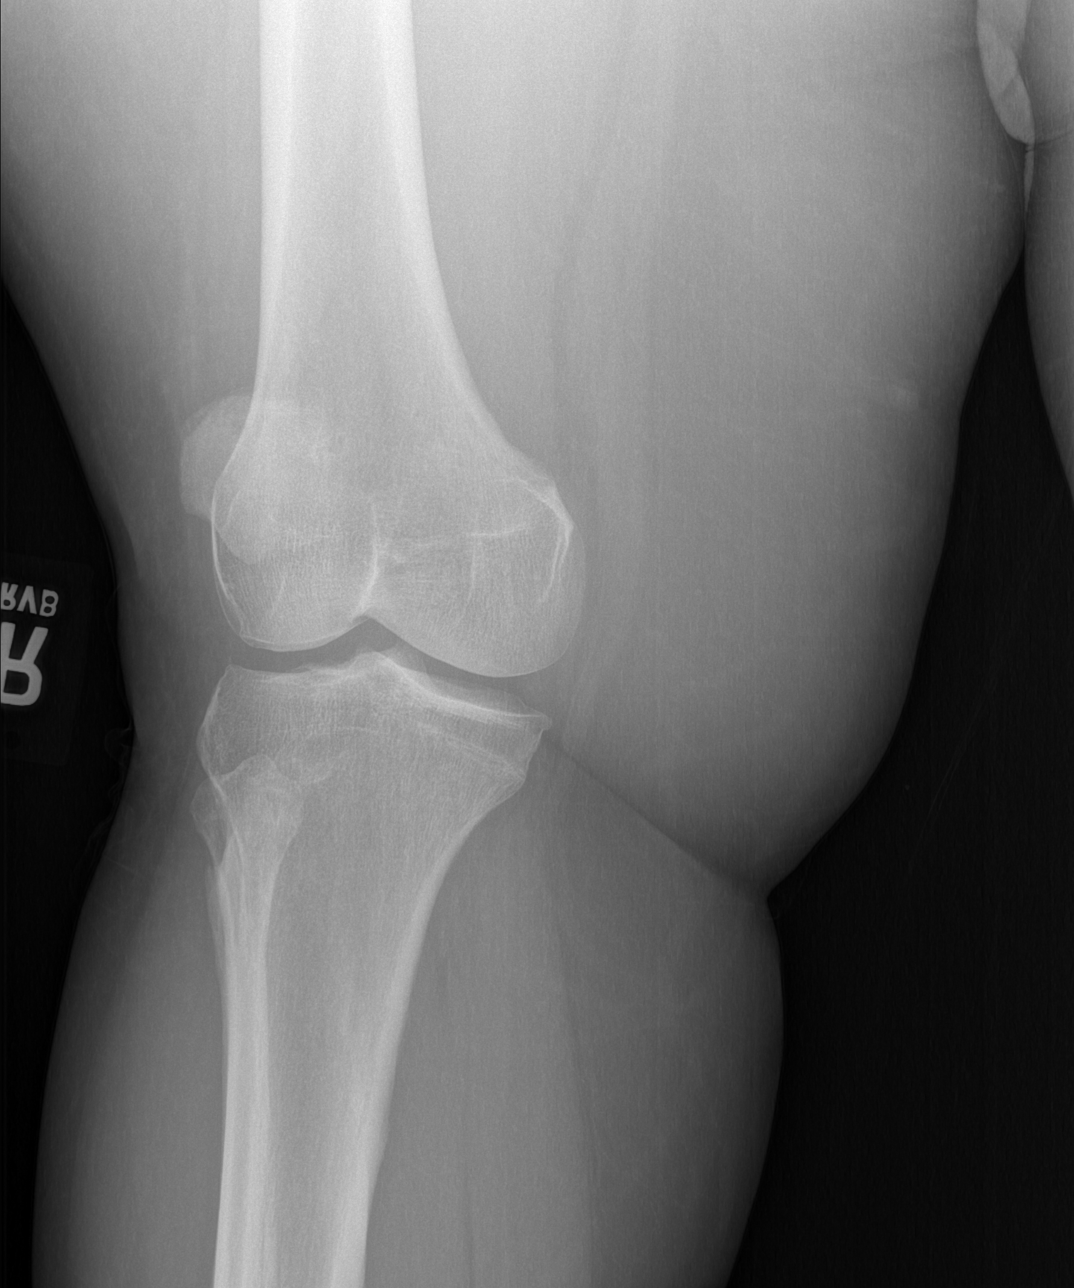

[t knee oblique right (1 of 2)]
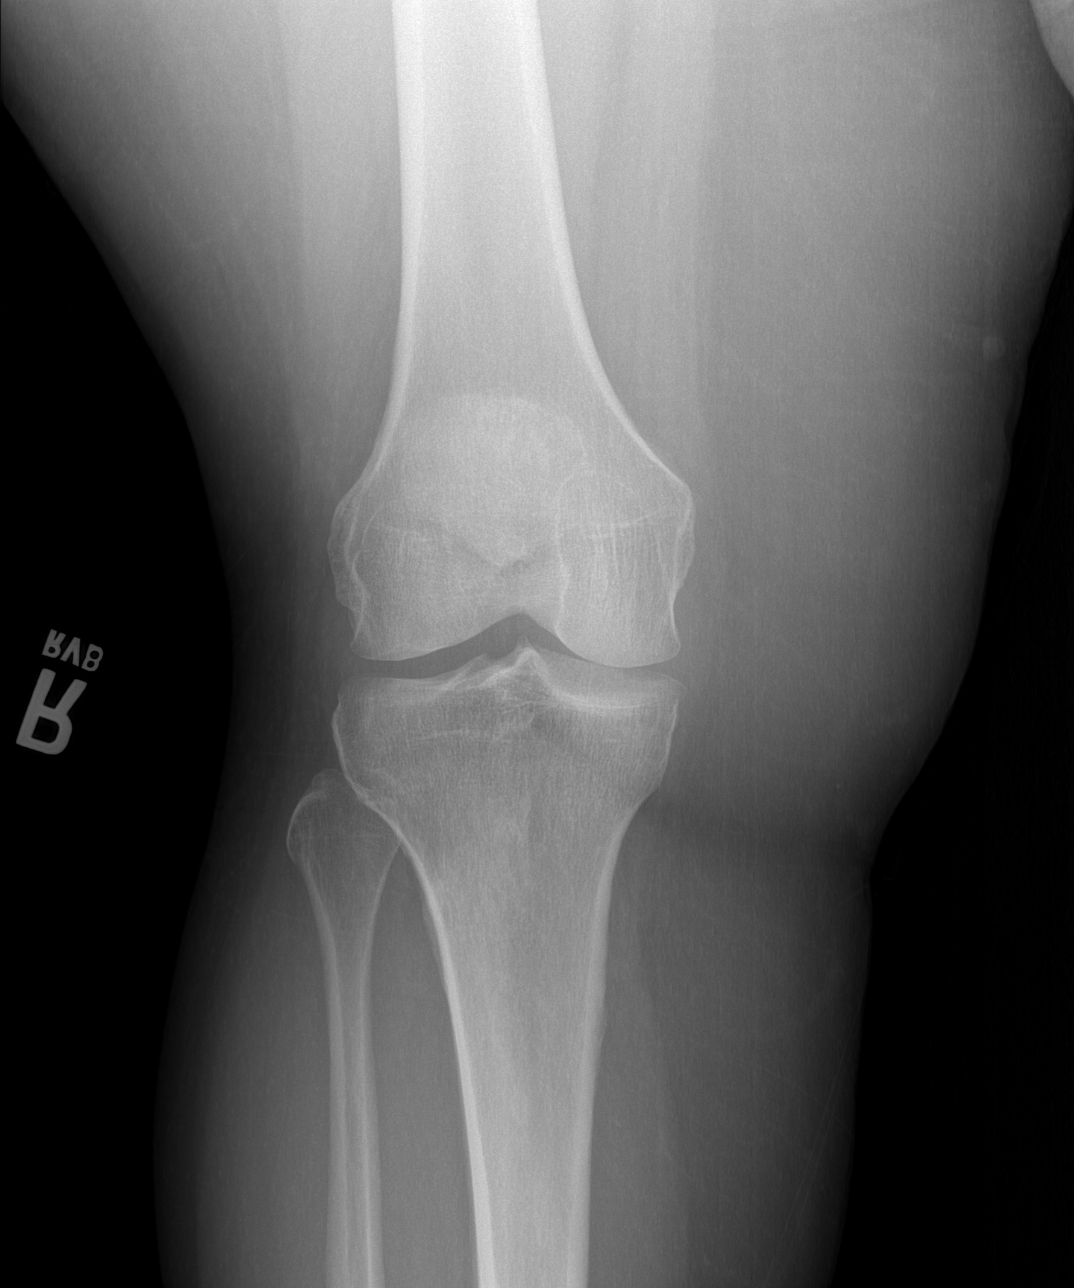

[t knee oblique right (2 of 2)]
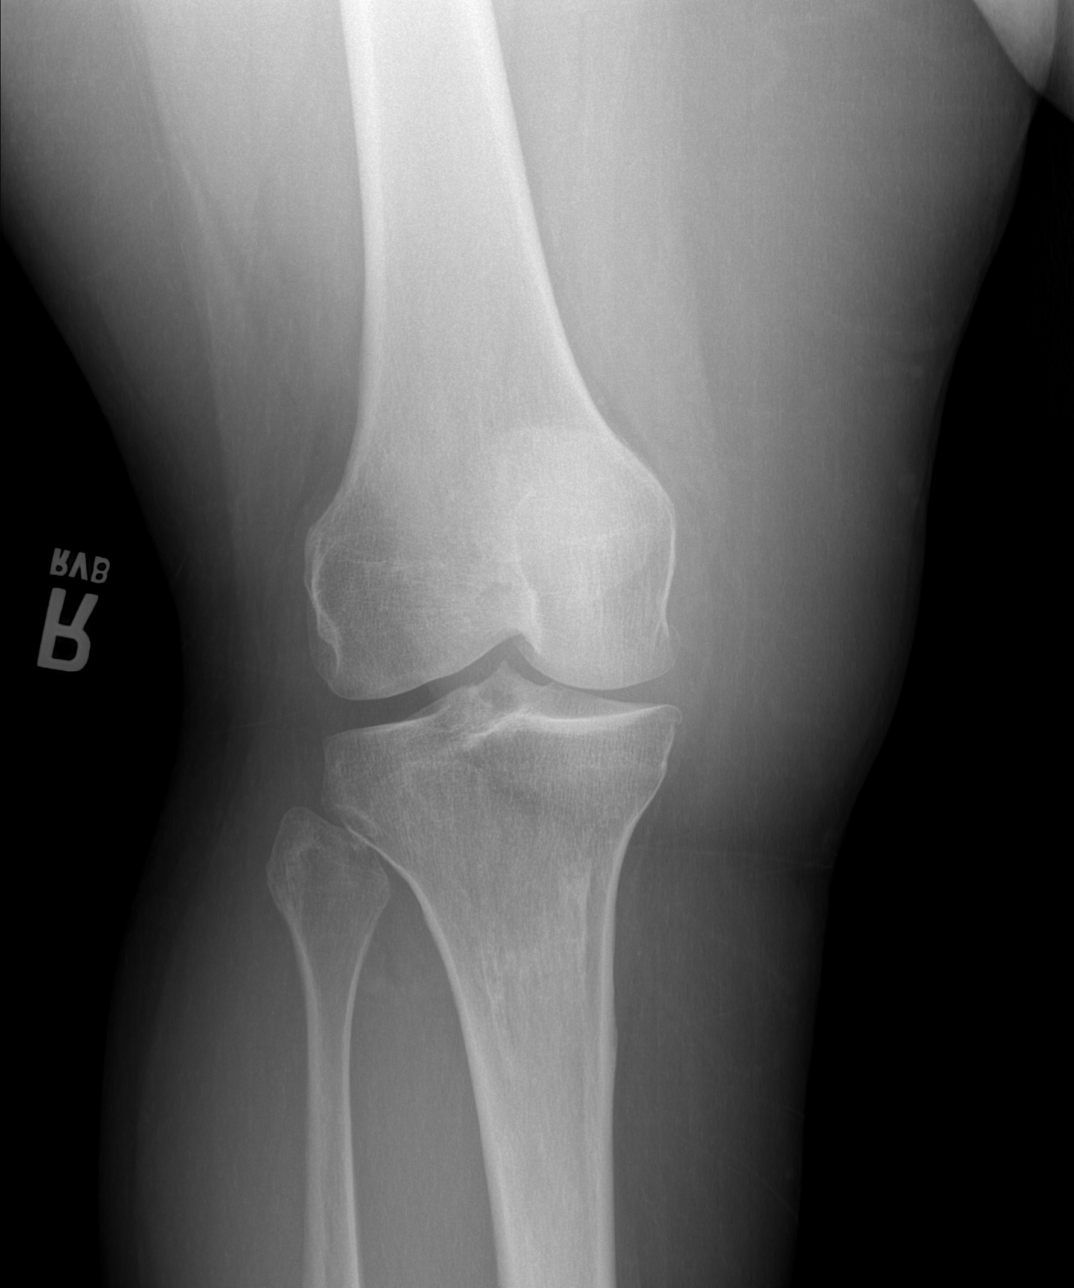

[t knee lat right]
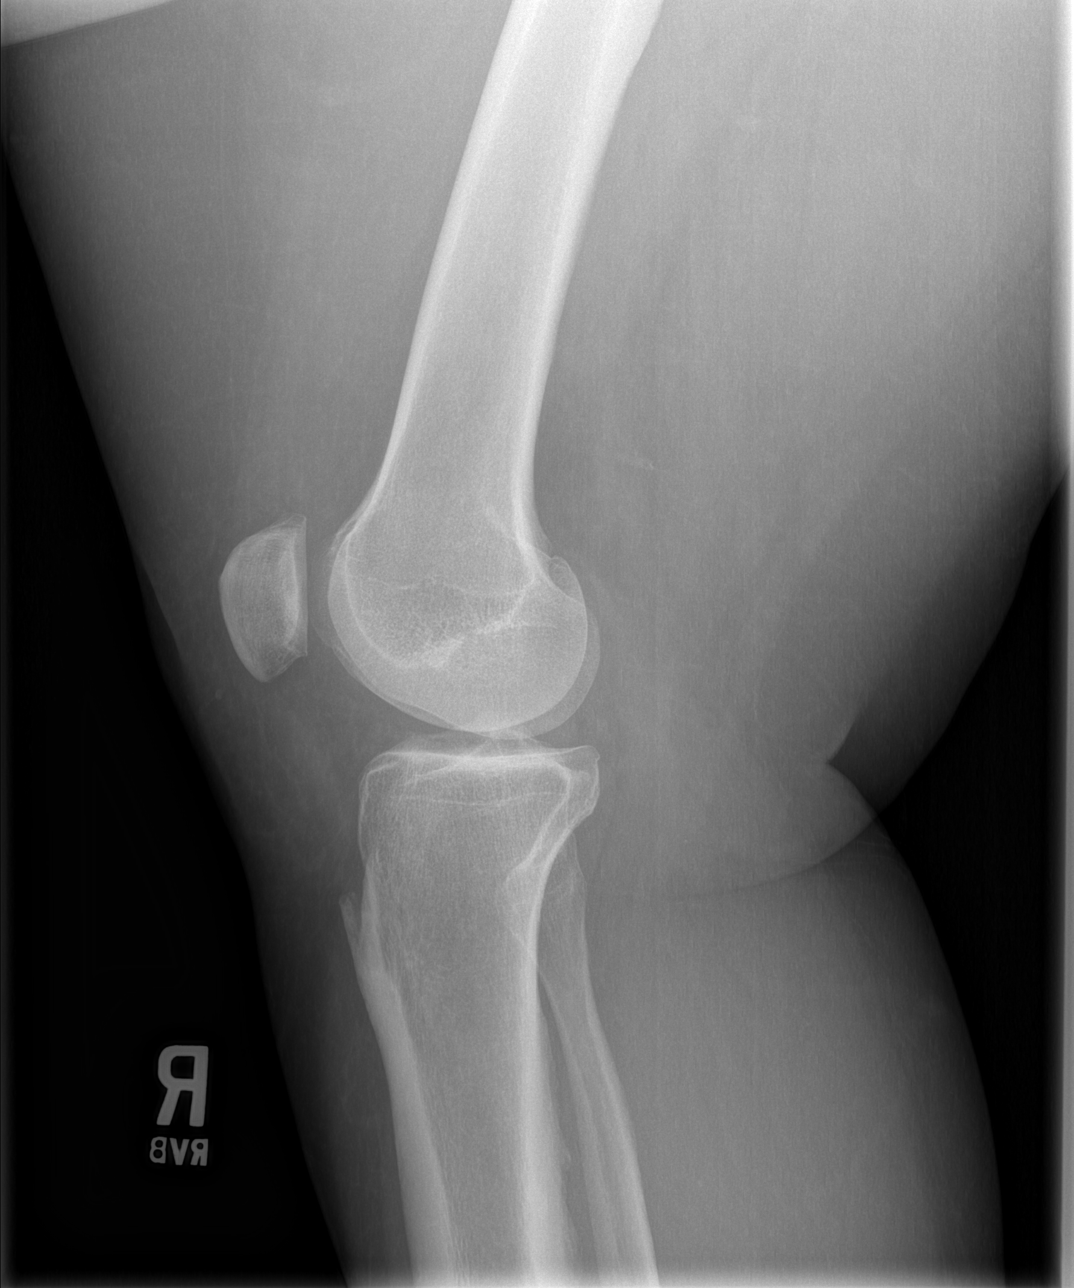

[4 of 4 positions shown; findings below may reference images not displayed]

FINDINGS: No evidence of fracture, dislocation, or joint effusion. No evidence
of arthropathy or other focal bone abnormality. Soft tissues are
unremarkable.
IMPRESSION: Negative.

## 2020-01-16 ENCOUNTER — Ambulatory Visit: Payer: Medicare Other | Admitting: Family Medicine

## 2020-02-06 ENCOUNTER — Ambulatory Visit: Payer: Medicare Other | Admitting: Family Medicine

## 2020-03-05 ENCOUNTER — Ambulatory Visit: Payer: Medicare Other | Admitting: Family Medicine

## 2020-04-02 ENCOUNTER — Encounter: Payer: Self-pay | Admitting: Family Medicine

## 2020-04-02 ENCOUNTER — Other Ambulatory Visit: Payer: Self-pay

## 2020-04-02 ENCOUNTER — Ambulatory Visit (INDEPENDENT_AMBULATORY_CARE_PROVIDER_SITE_OTHER): Payer: Medicare Other | Admitting: Family Medicine

## 2020-04-02 VITALS — BP 158/87 | HR 95 | Temp 98.2°F | Ht 60.0 in | Wt 280.0 lb

## 2020-04-02 DIAGNOSIS — B3731 Acute candidiasis of vulva and vagina: Secondary | ICD-10-CM

## 2020-04-02 DIAGNOSIS — Z794 Long term (current) use of insulin: Secondary | ICD-10-CM | POA: Diagnosis not present

## 2020-04-02 DIAGNOSIS — K047 Periapical abscess without sinus: Secondary | ICD-10-CM

## 2020-04-02 DIAGNOSIS — I10 Essential (primary) hypertension: Secondary | ICD-10-CM

## 2020-04-02 DIAGNOSIS — E118 Type 2 diabetes mellitus with unspecified complications: Secondary | ICD-10-CM | POA: Diagnosis not present

## 2020-04-02 DIAGNOSIS — N898 Other specified noninflammatory disorders of vagina: Secondary | ICD-10-CM

## 2020-04-02 DIAGNOSIS — B20 Human immunodeficiency virus [HIV] disease: Secondary | ICD-10-CM

## 2020-04-02 DIAGNOSIS — F320 Major depressive disorder, single episode, mild: Secondary | ICD-10-CM

## 2020-04-02 DIAGNOSIS — B373 Candidiasis of vulva and vagina: Secondary | ICD-10-CM

## 2020-04-02 DIAGNOSIS — K0889 Other specified disorders of teeth and supporting structures: Secondary | ICD-10-CM

## 2020-04-02 LAB — POCT URINALYSIS DIPSTICK
Glucose, UA: NEGATIVE
Leukocytes, UA: NEGATIVE
Nitrite, UA: 0.2
Protein, UA: POSITIVE — AB
Spec Grav, UA: >=1.03 — AB (ref 1.010–1.025)
Urobilinogen, UA: 0.2 E.U./dL
pH, UA: 6 (ref 5.0–8.0)

## 2020-04-02 LAB — POCT GLYCOSYLATED HEMOGLOBIN (HGB A1C)
HbA1c POC (<> result, manual entry): 9.9 % (ref 4.0–5.6)
HbA1c, POC (controlled diabetic range): 9.9 % — AB (ref 0.0–7.0)
HbA1c, POC (prediabetic range): 9.9 % — AB (ref 5.7–6.4)
Hemoglobin A1C: 9.9 % — AB (ref 4.0–5.6)

## 2020-04-02 LAB — GLUCOSE, POCT (MANUAL RESULT ENTRY): POC Glucose: 142 mg/dl — AB (ref 70–99)

## 2020-04-02 MED ORDER — ACETAMINOPHEN-CODEINE #3 300-30 MG PO TABS: 1.0000 | ORAL_TABLET | Freq: Four times a day (QID) | ORAL | 0 refills | Status: DC | PRN

## 2020-04-02 MED ORDER — IBUPROFEN 800 MG PO TABS: 800.0000 mg | ORAL_TABLET | Freq: Three times a day (TID) | ORAL | 0 refills | Status: DC | PRN

## 2020-04-02 MED ORDER — AMOXICILLIN-POT CLAVULANATE 875-125 MG PO TABS: 1.0000 | ORAL_TABLET | Freq: Two times a day (BID) | ORAL | 0 refills | Status: AC

## 2020-04-02 MED ORDER — LISINOPRIL-HYDROCHLOROTHIAZIDE 20-25 MG PO TABS: 1.0000 | ORAL_TABLET | Freq: Every day | ORAL | 5 refills | Status: DC

## 2020-04-02 MED ORDER — FLUCONAZOLE 150 MG PO TABS: 150.0000 mg | ORAL_TABLET | Freq: Once | ORAL | 0 refills | Status: AC

## 2020-04-02 NOTE — Progress Notes (Signed)
Patient [facility]   Established Patient Office Visit  Subjective:  Patient ID: Sherry Hall, female    DOB: [birth year]  Age: 39 y.o. MRN: [id number]  CC:  Chief Complaint  Patient presents with  . Follow-up    2 month follow up, month abscess  just  completed 10 medication     HPI Sherry Hall is a 39 year old female with a medical history significant for uncontrolled type 2 DM, hypertension, history of HIV, morbid obesity, and tobacco dependence presents for follow-up of chronic conditions. Patient is also complaining of tooth pain that has been present over the past several weeks.  Patient has been followed by mobile dentist and had several teeth removed.  Also, she has been on doxycycline for total of 7 days for a left upper dental abscess.  Patient endorses left facial swelling, left upper and lower pain, gum swelling, and temperature sensitivity.  Patient states that pain has been "excruciating".  She has been unable to gain any relief from ibuprofen.  She states that she has been taking more ibuprofen than directed due to increased pain.  She states that she has been unable to sleep and pain is characterized as constant and throbbing.  Patient endorses headache.  She denies bleeding gums.  Patient also has a history uncontrolled type 2 diabetes mellitus.  Patient states that she has been taking medication somewhat consistently.  She states that she has missed a few days during the time she was unable to eat after tooth extractions.  She denies polydipsia, polyuria, or polyphagia.  She also denies blurred vision.  She is not up-to-date with eye exam.  She checks feet regularly.  She does not check blood glucose consistently.  Patient also has a history of essential hypertension.  She has not been taking medications consistently.  She says that she needs refills on her lisinopril hydrochlorothiazide.  She did not take medication  prior to arrival.  She does not check blood pressure at home.  She denies dizziness, headache, blurry vision, lower extremity swelling, chest pain, shortness of breath, or heart palpitations.  Past Medical History:  Diagnosis Date  . Anemia    H/O  . Anxiety   . Depression   . Diabetes mellitus type 1 ([place])   . GERD (gastroesophageal reflux disease)   . HIV (human immunodeficiency virus infection) ([place]) 2011  . Hypertension   . Type 2 diabetes mellitus ([place])     Past Surgical History:  Procedure Laterality Date  . CESAREAN SECTION  2001, 2008, 2009, 2011  . CHOLECYSTECTOMY    . EXCISION OF BREAST LESION N/A 01/04/2018   Procedure: EXCISION OF CHEST WALL ABSCESS. MIDDLE CHEST;  Surgeon: Olean Ree, MD;  Location: [facility] ORS;  Service: General;  Laterality: N/A;  resection of chest wall cyst / abscess  . FRACTURE SURGERY Right    HAND  . TONSILLECTOMY    . TUBAL LIGATION  2011    Family History  Problem Relation Age of Onset  . Diabetes Father   . Hypertension Father   . Cancer Mother        cervical     Social History   Socioeconomic History  . Marital status: Single    Spouse name: Not on file  . Number of children: Not on file  . Years of education: Not on file  . Highest education level: Not on file  Occupational History  . Not on file  Tobacco  Use  . Smoking status: Current Every Day Smoker    Packs/day: 0.50    Years: 15.00    Pack years: 7.50    Types: Cigarettes  . Smokeless tobacco: Never Used  . Tobacco comment: trying to slow  Vaping Use  . Vaping Use: Never used  Substance and Sexual Activity  . Alcohol use: Yes    Alcohol/week: 1.0 standard drink    Types: 1 Standard drinks or equivalent per week    Comment: socially  . Drug use: No  . Sexual activity: Yes    Partners: Male    Birth control/protection: Surgical  Other Topics Concern  . Not on file  Social History Narrative  . Not on file   Social Determinants of Health   Financial  Resource Strain:   . Difficulty of Paying Living Expenses:   Food Insecurity:   . Worried About Charity fundraiser in the Last Year:   . Arboriculturist in the Last Year:   Transportation Needs:   . Film/video editor (Medical):   Marland Kitchen Lack of Transportation (Non-Medical):   Physical Activity:   . Days of Exercise per Week:   . Minutes of Exercise per Session:   Stress:   . Feeling of Stress :   Social Connections:   . Frequency of Communication with Friends and Family:   . Frequency of Social Gatherings with Friends and Family:   . Attends Religious Services:   . Active Member of Clubs or Organizations:   . Attends Archivist Meetings:   Marland Kitchen Marital Status:   Intimate Partner Violence:   . Fear of Current or Ex-Partner:   . Emotionally Abused:   Marland Kitchen Physically Abused:   . Sexually Abused:     Outpatient Medications Prior to Visit  Medication Sig Dispense Refill  . Accu-Chek FastClix Lancets MISC 1 Units by Does not apply route 4 (four) times daily as needed. 102 each 12  . ALPRAZolam (XANAX) 1 MG tablet     . ARIPiprazole (ABILIFY) 10 MG tablet     . atorvastatin (LIPITOR) 10 MG tablet TAKE 1 TABLET (10 MG TOTAL) BY MOUTH DAILY. (Patient taking differently: Take 10 mg by mouth daily at 6 PM. ) 90 tablet 3  . blood glucose meter kit and supplies KIT Dispense based on patient and insurance preference. Use up to four times daily as directed. (FOR ICD-9 250.00, 250.01). 1 each 0  . Blood Glucose Monitoring Suppl (ACCU-CHEK GUIDE ME) w/Device KIT 1 Units by Does not apply route 4 (four) times daily as needed. 1 kit 0  . busPIRone (BUSPAR) 10 MG tablet     . DESCOVY 200-25 MG tablet TAKE ONE TABLET BY MOUTH DAILY 30 tablet 0  . GLOBAL EASE INJECT PEN NEEDLES 32G X 4 MM MISC USE AS DIRECTED TO ADMINISTER INSULIN THREE TIMES DAILY BEFORE MEALS 100 each 9  . glucose blood (ACCU-CHEK GUIDE) test strip Use 3 times a day. Dx code: E11.9 100 each 12  . insulin aspart (NOVOLOG) 100  UNIT/ML FlexPen Per sliding scale. Max daily dose 40 units. 15 mL 11  . Insulin Glargine (LANTUS SOLOSTAR) 100 UNIT/ML Solostar Pen INJECT 30 UNITS INTO THE SKIN DAILY AT 10 IN THE EVENING 15 mL 11  . lisinopril-hydrochlorothiazide (ZESTORETIC) 20-25 MG tablet Take 1 tablet by mouth daily. 30 tablet 5  . ondansetron (ZOFRAN) 4 MG tablet Take 4 mg by mouth every 8 (eight) hours as needed for nausea or vomiting.    Marland Kitchen  propranolol (INDERAL) 10 MG tablet Take 1 tablet (10 mg total) by mouth 2 (two) times daily. 60 tablet 5  . sertraline (ZOLOFT) 50 MG tablet     . sitaGLIPtin-metformin (JANUMET) 50-1000 MG tablet TAKE 1 TABLET BY MOUTH TWO (TWO) TIMES DAILY WITH A MEAL. 60 tablet 6  . TIVICAY 50 MG tablet TAKE ONE TABLET BY MOUTH DAILY 30 tablet 0  . traZODone (DESYREL) 50 MG tablet Take 50-100 mg by mouth at bedtime as needed for sleep.   1  . doxycycline (VIBRA-TABS) 100 MG tablet Take 1 tablet (100 mg total) by mouth 2 (two) times daily. 20 tablet 0  . HYDROcodone-acetaminophen (NORCO/VICODIN) 5-325 MG tablet Take 1 tablet by mouth every 4 (four) hours as needed. 6 tablet 0   No facility-administered medications prior to visit.    Allergies  Allergen Reactions  . Latex Rash  . Percocet [Oxycodone-Acetaminophen] Hives, Itching and Rash    ROS Review of Systems  Constitutional: Positive for fatigue.  HENT: Positive for dental problem.   Eyes: Negative.   Respiratory: Negative.   Cardiovascular: Negative.   Gastrointestinal: Negative.   Endocrine: Negative for polydipsia, polyphagia and polyuria.  Genitourinary: Negative.   Musculoskeletal: Negative.   Neurological: Negative.   Hematological: Negative.   Psychiatric/Behavioral: Negative.       Objective:    Physical Exam HENT:     Head: Normocephalic.     Mouth/Throat:     Dentition: Abnormal dentition. Dental tenderness and dental abscesses present.     Comments: Left upper/lower gum erythema and swelling Eyes:     Pupils:  Pupils are equal, round, and reactive to light.  Cardiovascular:     Rate and Rhythm: Normal rate and regular rhythm.  Pulmonary:     Effort: Pulmonary effort is normal.  Abdominal:     General: Abdomen is flat. Bowel sounds are normal.  Musculoskeletal:        General: Normal range of motion.  Skin:    General: Skin is warm.  Neurological:     General: No focal deficit present.  Psychiatric:        Mood and Affect: Mood normal.        Thought Content: Thought content normal.        Judgment: Judgment normal.     BP (!) 153/79 (BP Location: Right Arm, Patient Position: Sitting, Cuff Size: Large)   Pulse 95   Temp 98.2 F (36.8 C) (Temporal)   Ht 5' (1.524 m)   Wt 280 lb (127 kg)   LMP 03/22/2020   SpO2 100%   BMI 54.68 kg/m  Wt Readings from Last 3 Encounters:  04/02/20 280 lb (127 kg)  10/10/19 274 lb 9.6 oz (124.6 kg)  08/10/19 275 lb (124.7 kg)     Health Maintenance Due  Topic Date Due  . Hepatitis C Screening  Never done  . COVID-19 Vaccine (1) Never done  . PAP SMEAR-Modifier  Never done  . OPHTHALMOLOGY EXAM  04/06/2017  . FOOT EXAM  06/05/2017    There are no preventive care reminders to display for this patient.  Lab Results  Component Value Date   TSH 1.42 07/08/2016   Lab Results  Component Value Date   WBC 5.8 08/10/2019   HGB 13.1 08/10/2019   HCT 39.2 08/10/2019   MCV 94.2 08/10/2019   PLT 212 08/10/2019   Lab Results  Component Value Date   NA 135 10/10/2019   K 4.0 10/10/2019   CO2 22  10/10/2019   GLUCOSE 182 (H) 10/10/2019   BUN 13 10/10/2019   CREATININE 0.80 10/10/2019   BILITOT 0.4 08/10/2019   ALKPHOS 85 09/23/2017   AST 11 08/10/2019   ALT 15 08/10/2019   PROT 7.7 08/10/2019   ALBUMIN 3.6 09/23/2017   CALCIUM 9.1 10/10/2019   ANIONGAP 7 01/03/2018   Lab Results  Component Value Date   CHOL 235 (H) 12/26/2018   Lab Results  Component Value Date   HDL 51 12/26/2018   Lab Results  Component Value Date   LDLCALC  147 (H) 12/26/2018   Lab Results  Component Value Date   TRIG 230 (H) 12/26/2018   Lab Results  Component Value Date   CHOLHDL 4.6 12/26/2018   Lab Results  Component Value Date   HGBA1C 10.7 (A) 10/10/2019      BP (!) 158/87   Pulse 95   Temp 98.2 F (36.8 C) (Temporal)   Ht 5' (1.524 m)   Wt 280 lb (127 kg)   LMP 03/22/2020   SpO2 100%   BMI 54.68 kg/m  Assessment & Plan:   Problem List Items Addressed This Visit    None    Visit Diagnoses    Type 2 diabetes mellitus with complication, with long-term current use of insulin (HCC)    -  Primary   Relevant Orders   HgB A1c   Glucose (CBG)   POCT Urinalysis Dipstick     Type 2 diabetes mellitus with complication, with long-term current use of insulin (HCC) Hemoglobin A1c is 9.9, which is improved from previous.  Discussed the importance of taking prescribed medications consistently in order to achieve positive outcomes. Also, recommend carbohydrate modified diet. - HgB A1c - Glucose (CBG) - POCT Urinalysis Dipstick - Comprehensive metabolic panel - CBC with Differential  Vaginal discharge Patient completed a course of doxycycline.  After completing doxycycline.  Patient reports some vaginal discharge.  She did ice any vaginal itching.  Patient typically gets yeast infections following antibiotics.  Will review results - NuSwab Vaginitis Plus (VG+)  Dental abscess Patient has erythema and swelling along upper and lower gum lines on left side.  Very poor dentition with multiple dental caries.  Patient is partially edentulous.  Will start a course of Augmentin 875-125 every 12 hours for total of 7 days.  Patient is also advised to follow-up with dentist. - amoxicillin-clavulanate (AUGMENTIN) 875-125 MG tablet; Take 1 tablet by mouth 2 (two) times daily for 7 days.  Dispense: 14 tablet; Refill: 0 . Vaginal yeast infection - fluconazole (DIFLUCAN) 150 MG tablet; Take 1 tablet (150 mg total) by mouth once for 1 dose.   Dispense: 1 tablet; Refill: 0  Pain, dental - acetaminophen-codeine (TYLENOL #3) 300-30 MG tablet; Take 1 tablet by mouth every 6 (six) hours as needed for moderate pain.  Dispense: 30 tablet; Refill: 0 - ibuprofen (ADVIL) 800 MG tablet; Take 1 tablet (800 mg total) by mouth every 8 (eight) hours as needed.  Dispense: 30 tablet; Refill: 0   Essential hypertension There have been some definite compliance issues here. I have discussed with her the great importance of following the treatment plan exactly as directed in order to achieve a good medical outcome. - lisinopril-hydrochlorothiazide (ZESTORETIC) 20-25 MG tablet; Take 1 tablet by mouth daily.  Dispense: 30 tablet; Refill: 5   HIV Disease:  Follow up with ID, patient will need to schedule a first available appointment  Depression: Patient denies suicidal or homicidal intent.  Office Visit from 04/02/2020 in [facility]  PHQ-2 Total Score 3       Follow-up: Return in about 3 months (around 07/03/2020).    Donia Pounds  APRN, MSN, FNP-C Patient [facility] [address] [place], [place] [address] [phone]

## 2020-04-03 LAB — COMPREHENSIVE METABOLIC PANEL
ALT: 15 IU/L (ref 0–32)
AST: 15 IU/L (ref 0–40)
Albumin/Globulin Ratio: 0.9 — ABNORMAL LOW (ref 1.2–2.2)
Albumin: 3.6 g/dL — ABNORMAL LOW (ref 3.8–4.8)
Alkaline Phosphatase: 89 IU/L (ref 48–121)
BUN/Creatinine Ratio: 16 (ref 9–23)
BUN: 14 mg/dL (ref 6–24)
Bilirubin Total: <0.2 mg/dL (ref 0.0–1.2)
CO2: 22 mmol/L (ref 20–29)
Calcium: 9.4 mg/dL (ref 8.7–10.2)
Chloride: 101 mmol/L (ref 96–106)
Creatinine, Ser: 0.89 mg/dL (ref 0.57–1.00)
GFR calc Af Amer: 94 mL/min/{1.73_m2} (ref >59)
GFR calc non Af Amer: 81 mL/min/{1.73_m2} (ref >59)
Globulin, Total: 4.2 g/dL (ref 1.5–4.5)
Glucose: 93 mg/dL (ref 65–99)
Potassium: 3.4 mmol/L — ABNORMAL LOW (ref 3.5–5.2)
Sodium: 137 mmol/L (ref 134–144)
Total Protein: 7.8 g/dL (ref 6.0–8.5)

## 2020-04-03 LAB — CBC WITH DIFFERENTIAL/PLATELET
Basophils Absolute: 0.1 10*3/uL (ref 0.0–0.2)
Basos: 1 %
EOS (ABSOLUTE): 0.3 10*3/uL (ref 0.0–0.4)
Eos: 3 %
Hematocrit: 38.1 % (ref 34.0–46.6)
Hemoglobin: 13.2 g/dL (ref 11.1–15.9)
Immature Grans (Abs): 0 10*3/uL (ref 0.0–0.1)
Immature Granulocytes: 0 %
Lymphocytes Absolute: 4.3 10*3/uL — ABNORMAL HIGH (ref 0.7–3.1)
Lymphs: 41 %
MCH: 31.6 pg (ref 26.6–33.0)
MCHC: 34.6 g/dL (ref 31.5–35.7)
MCV: 91 fL (ref 79–97)
Monocytes Absolute: 0.6 10*3/uL (ref 0.1–0.9)
Monocytes: 6 %
Neutrophils Absolute: 5.2 10*3/uL (ref 1.4–7.0)
Neutrophils: 49 %
Platelets: 216 10*3/uL (ref 150–450)
RBC: 4.18 x10E6/uL (ref 3.77–5.28)
RDW: 12.8 % (ref 11.7–15.4)
WBC: 10.5 10*3/uL (ref 3.4–10.8)

## 2020-04-05 ENCOUNTER — Other Ambulatory Visit: Payer: Self-pay | Admitting: Family Medicine

## 2020-04-05 ENCOUNTER — Telehealth: Payer: Self-pay

## 2020-04-05 DIAGNOSIS — B9689 Other specified bacterial agents as the cause of diseases classified elsewhere: Secondary | ICD-10-CM

## 2020-04-05 LAB — NUSWAB VAGINITIS PLUS (VG+)
Atopobium vaginae: HIGH Score — AB
BVAB 2: HIGH Score — AB
Candida albicans, NAA: NEGATIVE
Candida glabrata, NAA: NEGATIVE
Chlamydia trachomatis, NAA: NEGATIVE
Megasphaera 1: HIGH Score — AB
Neisseria gonorrhoeae, NAA: NEGATIVE
Trich vag by NAA: NEGATIVE

## 2020-04-05 MED ORDER — METRONIDAZOLE 0.75 % VA GEL: 1.0000 | Freq: Two times a day (BID) | VAGINAL | 0 refills | Status: AC

## 2020-04-05 NOTE — Telephone Encounter (Signed)
-----   Message from Lachina M Hollis, FNP sent at 04/05/2020 11:40 AM EDT ----- Regarding: lab results  Nu swab yielded bacterial vaginosis. Will start Metronidazole gel, 1 applicatorful twice daily for 5 days.   Lachina Moore Hollis  APRN, MSN, FNP-C Patient Care [facility] [address]  [place], [place] [address] [phone]   

## 2020-04-05 NOTE — Progress Notes (Signed)
Meds ordered this encounter  Medications  . metroNIDAZOLE (METROGEL) 0.75 % vaginal gel    Sig: Place 1 Applicatorful vaginally 2 (two) times daily for 5 days.    Dispense:  100 g    Refill:  0    Order Specific Question:   Supervising Provider    Answer:   Quentin Angst [[id number]]     Nolon Nations  APRN, MSN, FNP-C Patient Care [facility] [address] [place], [place] [address] [phone]

## 2020-04-05 NOTE — Telephone Encounter (Signed)
lvm for patient to call back about lab results.

## 2020-04-08 ENCOUNTER — Telehealth: Payer: Self-pay

## 2020-04-08 NOTE — Telephone Encounter (Signed)
-----   Message from Massie Maroon, [place] sent at 04/05/2020 11:40 AM EDT ----- Regarding: lab results  Nu swab yielded bacterial vaginosis. Will start Metronidazole gel, 1 applicatorful twice daily for 5 days.   Nolon Nations  APRN, MSN, FNP-C Patient Care [facility] [address] [place], [place] [address] [phone]

## 2020-04-08 NOTE — Telephone Encounter (Signed)
Tried to call patient about wasn't able to lvm  Voicemail was full.

## 2020-04-10 ENCOUNTER — Telehealth: Payer: Self-pay

## 2020-04-10 NOTE — Telephone Encounter (Signed)
Called and informed patient of her results from swab, also informed patient that a medicine was sent to the pharmacy to help with vaginosis. Pt stated that she has p/u the medicine.

## 2020-04-10 NOTE — Telephone Encounter (Signed)
-----   Message from Lachina M Hollis, FNP sent at 04/05/2020 11:40 AM EDT ----- Regarding: lab results  Nu swab yielded bacterial vaginosis. Will start Metronidazole gel, 1 applicatorful twice daily for 5 days.   Lachina Moore Hollis  APRN, MSN, FNP-C Patient [facility] [address]  [place], [place] [address] [phone]   

## 2020-05-08 ENCOUNTER — Telehealth: Payer: Self-pay | Admitting: Family Medicine

## 2020-05-08 ENCOUNTER — Other Ambulatory Visit: Payer: Self-pay | Admitting: Family Medicine

## 2020-05-08 DIAGNOSIS — B3731 Acute candidiasis of vulva and vagina: Secondary | ICD-10-CM

## 2020-05-08 MED ORDER — FLUCONAZOLE 150 MG PO TABS
150.0000 mg | ORAL_TABLET | Freq: Once | ORAL | 0 refills | Status: AC
Start: 2020-05-08 — End: 2020-05-08

## 2020-05-08 NOTE — Progress Notes (Signed)
Meds ordered this encounter  Medications  . fluconazole (DIFLUCAN) 150 MG tablet    Sig: Take 1 tablet (150 mg total) by mouth once for 1 dose.    Dispense:  1 tablet    Refill:  0    Order Specific Question:   Supervising Provider    Answer:   JEGEDE, OLUGBEMIGA E [[id number]]    Sherry Corvin Moore Kashmere Daywalt  APRN, MSN, FNP-C Patient Care [facility] [address]  [place], [place] [address] [phone]  

## 2020-05-09 NOTE — Telephone Encounter (Signed)
Sent to Np °

## 2020-07-09 ENCOUNTER — Ambulatory Visit: Payer: Medicare Other | Admitting: Family Medicine

## 2020-07-30 ENCOUNTER — Telehealth (INDEPENDENT_AMBULATORY_CARE_PROVIDER_SITE_OTHER): Payer: Medicare Other | Admitting: Family Medicine

## 2020-07-30 DIAGNOSIS — J029 Acute pharyngitis, unspecified: Secondary | ICD-10-CM

## 2020-07-30 DIAGNOSIS — R053 Chronic cough: Secondary | ICD-10-CM | POA: Diagnosis not present

## 2020-07-30 DIAGNOSIS — Z20822 Contact with and (suspected) exposure to covid-19: Secondary | ICD-10-CM | POA: Diagnosis not present

## 2020-07-30 DIAGNOSIS — R0609 Other forms of dyspnea: Secondary | ICD-10-CM

## 2020-07-30 DIAGNOSIS — R0600 Dyspnea, unspecified: Secondary | ICD-10-CM | POA: Diagnosis not present

## 2020-07-30 DIAGNOSIS — J399 Disease of upper respiratory tract, unspecified: Secondary | ICD-10-CM

## 2020-07-30 MED ORDER — BENZONATATE 100 MG PO CAPS
100.0000 mg | ORAL_CAPSULE | Freq: Three times a day (TID) | ORAL | 0 refills | Status: DC | PRN
Start: 2020-07-30 — End: 2020-12-24

## 2020-07-30 MED ORDER — ALBUTEROL SULFATE HFA 108 (90 BASE) MCG/ACT IN AERS
2.0000 | INHALATION_SPRAY | RESPIRATORY_TRACT | 0 refills | Status: DC | PRN
Start: 2020-07-30 — End: 2023-10-23

## 2020-07-30 MED ORDER — FLUCONAZOLE 150 MG PO TABS
150.0000 mg | ORAL_TABLET | Freq: Once | ORAL | 0 refills | Status: AC
Start: 2020-07-30 — End: 2020-07-30

## 2020-07-30 MED ORDER — AZITHROMYCIN 250 MG PO TABS: ORAL_TABLET | ORAL | 0 refills | Status: DC

## 2020-07-30 NOTE — Progress Notes (Signed)
[facility]   Virtual Visit via Telephone Note Patient location: Home Provider location: [facility] patient care center  I connected with Sherry Hall on 07/30/20 at  2:00 PM EDT by telephone and verified that I am speaking with the correct person using two identifiers.   I discussed the limitations, risks, security and privacy concerns of performing an evaluation and management service by telephone and the availability of in person appointments. I also discussed with the patient that there may be a patient responsible charge related to this service. The patient expressed understanding and agreed to proceed.   History of Present Illness: Sherry Hall is a 39 year old female with a medical history significant for hypertension, type 2 diabetes mellitus, morbid obesity, tobacco dependence, HIV disease, and depression and anxiety presents via telephone with complaints of sore throat, persistent cough, shortness of breath, and some chest pain.  Patient states that symptoms have been present over the past 3 days and have been unrelieved by albuterol inhaler.  Patient also has a history of seasonal allergies and takes antihistamine daily.  Patient states that her son had similar symptoms and was tested for COVID-19, which was negative.  Patient denies any sick contacts or recent travel.  She denies any headache, rhinorrhea, abdominal pain, nausea, vomiting, or diarrhea.  Review of Systems  Constitutional: Negative.   HENT: Positive for congestion and sore throat. Negative for sinus pain.   Eyes: Negative.   Respiratory: Positive for cough and shortness of breath.   Cardiovascular: Positive for chest pain. Negative for leg swelling.  Gastrointestinal: Negative.   Genitourinary: Negative.   Musculoskeletal: Positive for back pain and joint pain.  Skin: Negative.   Neurological: Negative.   Endo/Heme/Allergies: Negative.     Psychiatric/Behavioral: Negative.     Past Medical History:  Diagnosis Date  . Anemia    H/O  . Anxiety   . Depression   . Diabetes mellitus type 1 (HCC)   . GERD (gastroesophageal reflux disease)   . HIV (human immunodeficiency virus infection) (HCC) 2011  . Hypertension   . Type 2 diabetes mellitus (HCC)     Observations/Objective:   Assessment and Plan:  Upper respiratory disease Registered patient for appointment at mobile unit for COVID-19 testing.  Will start a trial of azithromycin for upper respiratory symptoms. Also, recommend over-the-counter Coricidin BP, being that patient has high blood pressure.  - azithromycin (ZITHROMAX) 250 MG tablet; Take 500 mg day 1. Take 250 mg on days 2-5.  Dispense: 6 tablet; Refill: 0  Dyspnea on exertion Patient advised to continue albuterol inhaler 2 puffs every 4 hours as needed for wheezing, cough, and/or shortness of breath.  Sore throat: Recommend warm salt water gargles.  Tylenol 650 mg every 6 hours as needed for throat pain.  Persistent cough: Patient advised to get tested for COVID-19.   Recommend Coricidin BP Increase rest, handwashing, and water intake.   Follow Up Instructions:      I discussed the assessment and treatment plan with the patient. The patient was provided an opportunity to ask questions and all were answered. The patient agreed with the plan and demonstrated an understanding of the instructions.   The patient was advised to call back or seek an in-person evaluation if the symptoms worsen or if the condition fails to improve as anticipated.  I provided 15 minutes of non-face-to-face time during this encounter.   Nolon Nations  APRN, MSN, FNP-C Patient [facility]  [place] [address] [place], [place] [address] [phone]

## 2020-07-31 ENCOUNTER — Other Ambulatory Visit: Payer: Medicare Other

## 2020-07-31 DIAGNOSIS — Z20822 Contact with and (suspected) exposure to covid-19: Secondary | ICD-10-CM

## 2020-08-01 LAB — NOVEL CORONAVIRUS, NAA: SARS-CoV-2, NAA: NOT DETECTED

## 2020-08-01 LAB — SARS-COV-2, NAA 2 DAY TAT

## 2020-08-19 ENCOUNTER — Other Ambulatory Visit: Payer: Self-pay

## 2020-08-19 ENCOUNTER — Telehealth (INDEPENDENT_AMBULATORY_CARE_PROVIDER_SITE_OTHER): Payer: Medicare Other | Admitting: Nurse Practitioner

## 2020-08-19 ENCOUNTER — Encounter: Payer: Self-pay | Admitting: Nurse Practitioner

## 2020-08-19 DIAGNOSIS — J029 Acute pharyngitis, unspecified: Secondary | ICD-10-CM

## 2020-08-19 MED ORDER — FLUCONAZOLE 150 MG PO TABS
150.0000 mg | ORAL_TABLET | Freq: Once | ORAL | 0 refills | Status: AC
Start: 2020-08-19 — End: 2020-08-19

## 2020-08-19 MED ORDER — AMOXICILLIN-POT CLAVULANATE 875-125 MG PO TABS
1.0000 | ORAL_TABLET | Freq: Two times a day (BID) | ORAL | 0 refills | Status: AC
Start: 2020-08-19 — End: 2020-08-29

## 2020-08-19 NOTE — Progress Notes (Signed)
° °  [facility] Patient [facility] [address] [place] [address] [place], [place]  [address] Phone:  [phone]   Fax:  [phone] Virtual Visit via Telephone Note  I connected with Alijah Karnes on 08/19/20 at 10:20 AM EDT by telephone and verified that I am speaking with the correct person using two identifiers.   I discussed the limitations, risks, security and privacy concerns of performing an evaluation and management service by telephone and the availability of in person appointments. I also discussed with the patient that there may be a patient responsible charge related to this service. The patient expressed understanding and agreed to proceed.  Patient home Provider Office  History of Present Illness:  Sore Throat Patient complains of sore throat. Associated symptoms include sore throat. Onset of symptoms was 3 days ago, and have been gradually worsening since that time. She having some problems swallowing. . She has had recent close exposure to someone with proven streptococcal pharyngitis. She denies any changes in her taste. She has been drinking throat tea, Theraflu. She has also been using cough drops. She is SP tonsillectomy as a child. She admits that it feels like that type of pain.  She was Z-pak 3 weeks ago for for URI. She admits that her CBG has been fluctuating.  Observations/Objective: No exam today due to virtual visit Patient able to carry on full conversation without difficulty  Assessment and Plan: Assessment  Primary Diagnosis & Pertinent Problem List: The encounter diagnosis was Pharyngitis, unspecified etiology.  Visit Diagnosis: 1. Pharyngitis, unspecified etiology  Encouraged the use of Chloraseptic spray along with salt water gargles and due to patient's comorbidities of HIV and diabetes will provide patient with wait-and-see antibiotic therapy if needed     Follow Up Instructions:    I discussed the assessment and treatment plan with the patient. The  patient was provided an opportunity to ask questions and all were answered. The patient agreed with the plan and demonstrated an understanding of the instructions.   The patient was advised to call back or seek an in-person evaluation if the symptoms worsen or if the condition fails to improve as anticipated.  I provided 12 minutes of non-face-to-face time during this encounter.   Barbette Merino, NP

## 2020-08-19 NOTE — Patient Instructions (Signed)
Pharyngitis  Pharyngitis is a sore throat (pharynx). This is when there is redness, pain, and swelling in your throat. Most of the time, this condition gets better on its own. In some cases, you may need medicine. Follow these instructions at home:  Take over-the-counter and prescription medicines only as told by your doctor. ? If you were prescribed an antibiotic medicine, take it as told by your doctor. Do not stop taking the antibiotic even if you start to feel better. ? Do not give children aspirin. Aspirin has been linked to Reye syndrome.  Drink enough water and fluids to keep your pee (urine) clear or pale yellow.  Get a lot of rest.  Rinse your mouth (gargle) with a salt-water mixture 3-4 times a day or as needed. To make a salt-water mixture, completely dissolve -1 tsp of salt in 1 cup of warm water.  If your doctor approves, you may use throat lozenges or sprays to soothe your throat. Contact a doctor if:  You have large, tender lumps in your neck.  You have a rash.  You cough up green, yellow-brown, or bloody spit. Get help right away if:  You have a stiff neck.  You drool or cannot swallow liquids.  You cannot drink or take medicines without throwing up.  You have very bad pain that does not go away with medicine.  You have problems breathing, and it is not from a stuffy nose.  You have new pain and swelling in your knees, ankles, wrists, or elbows. Summary  Pharyngitis is a sore throat (pharynx). This is when there is redness, pain, and swelling in your throat.  If you were prescribed an antibiotic medicine, take it as told by your doctor. Do not stop taking the antibiotic even if you start to feel better.  Most of the time, pharyngitis gets better on its own. Sometimes, you may need medicine. This information is not intended to replace advice given to you by your health care provider. Make sure you discuss any questions you have with your health care  provider. Document Revised: 09/17/2017 Document Reviewed: 11/10/2016 Elsevier Patient Education  2020 Elsevier Inc.  

## 2020-09-02 ENCOUNTER — Other Ambulatory Visit: Payer: Self-pay | Admitting: Family Medicine

## 2020-09-02 DIAGNOSIS — I10 Essential (primary) hypertension: Secondary | ICD-10-CM

## 2020-09-10 ENCOUNTER — Ambulatory Visit: Payer: Medicare Other | Admitting: Family Medicine

## 2020-10-01 NOTE — Telephone Encounter (Signed)
Gave pt 30 day supply of medication , pt needs to make an follow up appt for further refills.

## 2020-12-11 ENCOUNTER — Inpatient Hospital Stay (HOSPITAL_COMMUNITY)
Admission: EM | Admit: 2020-12-11 | Discharge: 2020-12-18 | DRG: 291 | Disposition: A | Discharge status: Home / Self Care | Payer: Medicare Other | Attending: Internal Medicine | Admitting: Internal Medicine

## 2020-12-11 ENCOUNTER — Emergency Department (HOSPITAL_COMMUNITY): Payer: Medicare Other

## 2020-12-11 ENCOUNTER — Encounter (HOSPITAL_COMMUNITY): Payer: Self-pay | Admitting: Emergency Medicine

## 2020-12-11 ENCOUNTER — Inpatient Hospital Stay (HOSPITAL_COMMUNITY): Payer: Medicare Other

## 2020-12-11 DIAGNOSIS — K5900 Constipation, unspecified: Secondary | ICD-10-CM | POA: Diagnosis present

## 2020-12-11 DIAGNOSIS — N179 Acute kidney failure, unspecified: Secondary | ICD-10-CM | POA: Diagnosis not present

## 2020-12-11 DIAGNOSIS — E1169 Type 2 diabetes mellitus with other specified complication: Secondary | ICD-10-CM | POA: Diagnosis not present

## 2020-12-11 DIAGNOSIS — Z7984 Long term (current) use of oral hypoglycemic drugs: Secondary | ICD-10-CM

## 2020-12-11 DIAGNOSIS — Z794 Long term (current) use of insulin: Secondary | ICD-10-CM | POA: Diagnosis not present

## 2020-12-11 DIAGNOSIS — T502X5A Adverse effect of carbonic-anhydrase inhibitors, benzothiadiazides and other diuretics, initial encounter: Secondary | ICD-10-CM | POA: Diagnosis not present

## 2020-12-11 DIAGNOSIS — J45909 Unspecified asthma, uncomplicated: Secondary | ICD-10-CM | POA: Diagnosis present

## 2020-12-11 DIAGNOSIS — F419 Anxiety disorder, unspecified: Secondary | ICD-10-CM | POA: Diagnosis present

## 2020-12-11 DIAGNOSIS — I10 Essential (primary) hypertension: Secondary | ICD-10-CM | POA: Diagnosis not present

## 2020-12-11 DIAGNOSIS — I5043 Acute on chronic combined systolic (congestive) and diastolic (congestive) heart failure: Secondary | ICD-10-CM | POA: Diagnosis present

## 2020-12-11 DIAGNOSIS — Z9049 Acquired absence of other specified parts of digestive tract: Secondary | ICD-10-CM | POA: Diagnosis not present

## 2020-12-11 DIAGNOSIS — I110 Hypertensive heart disease with heart failure: Secondary | ICD-10-CM | POA: Diagnosis present

## 2020-12-11 DIAGNOSIS — Z8049 Family history of malignant neoplasm of other genital organs: Secondary | ICD-10-CM | POA: Diagnosis not present

## 2020-12-11 DIAGNOSIS — I5020 Unspecified systolic (congestive) heart failure: Secondary | ICD-10-CM | POA: Diagnosis not present

## 2020-12-11 DIAGNOSIS — F17210 Nicotine dependence, cigarettes, uncomplicated: Secondary | ICD-10-CM | POA: Diagnosis present

## 2020-12-11 DIAGNOSIS — E785 Hyperlipidemia, unspecified: Secondary | ICD-10-CM

## 2020-12-11 DIAGNOSIS — F32A Depression, unspecified: Secondary | ICD-10-CM | POA: Diagnosis present

## 2020-12-11 DIAGNOSIS — E1065 Type 1 diabetes mellitus with hyperglycemia: Secondary | ICD-10-CM | POA: Diagnosis present

## 2020-12-11 DIAGNOSIS — Z9114 Patient's other noncompliance with medication regimen: Secondary | ICD-10-CM

## 2020-12-11 DIAGNOSIS — I5021 Acute systolic (congestive) heart failure: Secondary | ICD-10-CM | POA: Diagnosis not present

## 2020-12-11 DIAGNOSIS — U071 COVID-19: Secondary | ICD-10-CM | POA: Diagnosis present

## 2020-12-11 DIAGNOSIS — I5031 Acute diastolic (congestive) heart failure: Secondary | ICD-10-CM | POA: Diagnosis not present

## 2020-12-11 DIAGNOSIS — I952 Hypotension due to drugs: Secondary | ICD-10-CM | POA: Diagnosis not present

## 2020-12-11 DIAGNOSIS — Z79899 Other long term (current) drug therapy: Secondary | ICD-10-CM | POA: Diagnosis not present

## 2020-12-11 DIAGNOSIS — R0602 Shortness of breath: Secondary | ICD-10-CM | POA: Diagnosis not present

## 2020-12-11 DIAGNOSIS — B20 Human immunodeficiency virus [HIV] disease: Secondary | ICD-10-CM | POA: Diagnosis present

## 2020-12-11 DIAGNOSIS — R0600 Dyspnea, unspecified: Secondary | ICD-10-CM | POA: Diagnosis present

## 2020-12-11 DIAGNOSIS — Z6841 Body Mass Index (BMI) 40.0 and over, adult: Secondary | ICD-10-CM | POA: Diagnosis not present

## 2020-12-11 DIAGNOSIS — Z8616 Personal history of COVID-19: Secondary | ICD-10-CM

## 2020-12-11 LAB — CBC
HCT: 35.9 % — ABNORMAL LOW (ref 36.0–46.0)
Hemoglobin: 12.1 g/dL (ref 12.0–15.0)
MCH: 32.1 pg (ref 26.0–34.0)
MCHC: 33.7 g/dL (ref 30.0–36.0)
MCV: 95.2 fL (ref 80.0–100.0)
Platelets: 194 10*3/uL (ref 150–400)
RBC: 3.77 MIL/uL — ABNORMAL LOW (ref 3.87–5.11)
RDW: 13.6 % (ref 11.5–15.5)
WBC: 7.6 10*3/uL (ref 4.0–10.5)
nRBC: 0 % (ref 0.0–0.2)

## 2020-12-11 LAB — HEMOGLOBIN A1C
Hgb A1c MFr Bld: 9.8 % — ABNORMAL HIGH (ref 4.8–5.6)
Mean Plasma Glucose: 234.56 mg/dL

## 2020-12-11 LAB — BASIC METABOLIC PANEL
Anion gap: 10 (ref 5–15)
BUN: 18 mg/dL (ref 6–20)
CO2: 19 mmol/L — ABNORMAL LOW (ref 22–32)
Calcium: 8.7 mg/dL — ABNORMAL LOW (ref 8.9–10.3)
Chloride: 103 mmol/L (ref 98–111)
Creatinine, Ser: 0.94 mg/dL (ref 0.44–1.00)
GFR, Estimated: >60 mL/min (ref >60)
Glucose, Bld: 309 mg/dL — ABNORMAL HIGH (ref 70–99)
Potassium: 4.1 mmol/L (ref 3.5–5.1)
Sodium: 132 mmol/L — ABNORMAL LOW (ref 135–145)

## 2020-12-11 LAB — CBG MONITORING, ED: Glucose-Capillary: 224 mg/dL — ABNORMAL HIGH (ref 70–99)

## 2020-12-11 LAB — D-DIMER, QUANTITATIVE: D-Dimer, Quant: 2.49 ug/mL-FEU — ABNORMAL HIGH (ref 0.00–0.50)

## 2020-12-11 LAB — SARS CORONAVIRUS 2 (TAT 6-24 HRS): SARS Coronavirus 2: POSITIVE — AB

## 2020-12-11 LAB — TROPONIN I (HIGH SENSITIVITY)
Troponin I (High Sensitivity): 12 ng/L (ref <18)
Troponin I (High Sensitivity): 12 ng/L (ref <18)

## 2020-12-11 LAB — I-STAT BETA HCG BLOOD, ED (MC, WL, AP ONLY): I-stat hCG, quantitative: <5 m[IU]/mL (ref <5)

## 2020-12-11 LAB — GLUCOSE, CAPILLARY: Glucose-Capillary: 144 mg/dL — ABNORMAL HIGH (ref 70–99)

## 2020-12-11 LAB — BRAIN NATRIURETIC PEPTIDE: B Natriuretic Peptide: 171 pg/mL — ABNORMAL HIGH (ref 0.0–100.0)

## 2020-12-11 MED ORDER — BUSPIRONE HCL 5 MG PO TABS
10.0000 mg | ORAL_TABLET | Freq: Three times a day (TID) | ORAL | Status: DC
  Administered 2020-12-11 – 2020-12-18 (×21): 10 mg via ORAL
  Filled 2020-12-11 (×4): qty 2
  Filled 2020-12-11: qty 1
  Filled 2020-12-11 (×16): qty 2

## 2020-12-11 MED ORDER — HYDROCHLOROTHIAZIDE 25 MG PO TABS
25.0000 mg | ORAL_TABLET | Freq: Every day | ORAL | Status: DC
  Administered 2020-12-11: 25 mg via ORAL
  Filled 2020-12-11: qty 1

## 2020-12-11 MED ORDER — INSULIN GLARGINE 100 UNIT/ML ~~LOC~~ SOLN
15.0000 [IU] | Freq: Every day | SUBCUTANEOUS | Status: DC
  Administered 2020-12-11 – 2020-12-17 (×7): 15 [IU] via SUBCUTANEOUS
  Filled 2020-12-11 (×8): qty 0.15

## 2020-12-11 MED ORDER — LISINOPRIL 20 MG PO TABS
20.0000 mg | ORAL_TABLET | Freq: Every day | ORAL | Status: DC
  Administered 2020-12-11 – 2020-12-12 (×2): 20 mg via ORAL
  Filled 2020-12-11 (×2): qty 1

## 2020-12-11 MED ORDER — SODIUM CHLORIDE 0.9 % IV SOLN
200.0000 mg | Freq: Once | INTRAVENOUS | Status: AC
  Administered 2020-12-11: 200 mg via INTRAVENOUS
  Filled 2020-12-11: qty 40

## 2020-12-11 MED ORDER — ONDANSETRON HCL 4 MG/2ML IJ SOLN
4.0000 mg | Freq: Four times a day (QID) | INTRAMUSCULAR | Status: DC | PRN
  Administered 2020-12-12: 4 mg via INTRAVENOUS
  Filled 2020-12-11: qty 2

## 2020-12-11 MED ORDER — PROPRANOLOL HCL 10 MG PO TABS: 10.0000 mg | ORAL_TABLET | Freq: Two times a day (BID) | ORAL | Status: DC

## 2020-12-11 MED ORDER — ENOXAPARIN SODIUM 40 MG/0.4ML ~~LOC~~ SOLN: 40.0000 mg | SUBCUTANEOUS | Status: DC

## 2020-12-11 MED ORDER — INSULIN ASPART 100 UNIT/ML ~~LOC~~ SOLN
0.0000 [IU] | Freq: Three times a day (TID) | SUBCUTANEOUS | Status: DC
  Administered 2020-12-11: 5 [IU] via SUBCUTANEOUS
  Administered 2020-12-12: 2 [IU] via SUBCUTANEOUS
  Administered 2020-12-12 (×2): 3 [IU] via SUBCUTANEOUS
  Administered 2020-12-13: 2 [IU] via SUBCUTANEOUS
  Administered 2020-12-13: 3 [IU] via SUBCUTANEOUS
  Administered 2020-12-13: 5 [IU] via SUBCUTANEOUS
  Administered 2020-12-14: 3 [IU] via SUBCUTANEOUS
  Administered 2020-12-14: 2 [IU] via SUBCUTANEOUS
  Administered 2020-12-14: 5 [IU] via SUBCUTANEOUS
  Administered 2020-12-15: 3 [IU] via SUBCUTANEOUS
  Administered 2020-12-15: 5 [IU] via SUBCUTANEOUS
  Administered 2020-12-16 – 2020-12-17 (×3): 2 [IU] via SUBCUTANEOUS
  Administered 2020-12-17: 3 [IU] via SUBCUTANEOUS

## 2020-12-11 MED ORDER — ENOXAPARIN SODIUM 40 MG/0.4ML ~~LOC~~ SOLN
40.0000 mg | SUBCUTANEOUS | Status: DC
  Administered 2020-12-11 – 2020-12-13 (×3): 40 mg via SUBCUTANEOUS
  Filled 2020-12-11 (×3): qty 0.4

## 2020-12-11 MED ORDER — EMTRICITABINE-TENOFOVIR AF 200-25 MG PO TABS: 1.0000 | ORAL_TABLET | Freq: Every day | ORAL | Status: DC

## 2020-12-11 MED ORDER — ALBUTEROL SULFATE HFA 108 (90 BASE) MCG/ACT IN AERS
2.0000 | INHALATION_SPRAY | RESPIRATORY_TRACT | Status: DC | PRN
  Filled 2020-12-11: qty 6.7

## 2020-12-11 MED ORDER — LISINOPRIL-HYDROCHLOROTHIAZIDE 20-25 MG PO TABS: 1.0000 | ORAL_TABLET | Freq: Every day | ORAL | Status: DC

## 2020-12-11 MED ORDER — FUROSEMIDE 10 MG/ML IJ SOLN
40.0000 mg | Freq: Once | INTRAMUSCULAR | Status: AC
  Administered 2020-12-11: 40 mg via INTRAVENOUS
  Filled 2020-12-11: qty 4

## 2020-12-11 MED ORDER — IOHEXOL 350 MG/ML SOLN
100.0000 mL | Freq: Once | INTRAVENOUS | Status: AC | PRN
  Administered 2020-12-11: 68 mL via INTRAVENOUS

## 2020-12-11 MED ORDER — ARIPIPRAZOLE 10 MG PO TABS
10.0000 mg | ORAL_TABLET | Freq: Every day | ORAL | Status: DC
Start: 2020-12-11 — End: 2020-12-11

## 2020-12-11 MED ORDER — SODIUM CHLORIDE 0.9 % IV SOLN
100.0000 mg | Freq: Every day | INTRAVENOUS | Status: AC
  Administered 2020-12-12 – 2020-12-13 (×2): 100 mg via INTRAVENOUS
  Filled 2020-12-11 (×3): qty 20

## 2020-12-11 MED ORDER — ACETAMINOPHEN 325 MG PO TABS
650.0000 mg | ORAL_TABLET | Freq: Four times a day (QID) | ORAL | Status: DC | PRN
  Administered 2020-12-17: 650 mg via ORAL
  Filled 2020-12-11: qty 2

## 2020-12-11 MED ORDER — FUROSEMIDE 10 MG/ML IJ SOLN
40.0000 mg | Freq: Every day | INTRAMUSCULAR | Status: DC
  Administered 2020-12-12: 40 mg via INTRAVENOUS
  Filled 2020-12-11: qty 4

## 2020-12-11 MED ORDER — ATORVASTATIN CALCIUM 10 MG PO TABS: 10.0000 mg | ORAL_TABLET | Freq: Every day | ORAL | Status: DC

## 2020-12-11 MED ORDER — ALPRAZOLAM 0.5 MG PO TABS
1.0000 mg | ORAL_TABLET | Freq: Two times a day (BID) | ORAL | Status: DC | PRN
  Administered 2020-12-12 – 2020-12-18 (×11): 1 mg via ORAL
  Filled 2020-12-11 (×11): qty 2

## 2020-12-11 MED ORDER — TRAZODONE HCL 50 MG PO TABS
50.0000 mg | ORAL_TABLET | Freq: Every evening | ORAL | Status: DC | PRN
  Administered 2020-12-11: 50 mg via ORAL
  Filled 2020-12-11 (×2): qty 1

## 2020-12-11 MED ORDER — POLYETHYLENE GLYCOL 3350 17 G PO PACK: 17.0000 g | PACK | Freq: Every day | ORAL | Status: DC | PRN

## 2020-12-11 MED ORDER — BENZONATATE 100 MG PO CAPS
100.0000 mg | ORAL_CAPSULE | Freq: Three times a day (TID) | ORAL | Status: DC | PRN
  Filled 2020-12-11: qty 1

## 2020-12-11 MED ORDER — ACETAMINOPHEN 650 MG RE SUPP: 650.0000 mg | Freq: Four times a day (QID) | RECTAL | Status: DC | PRN

## 2020-12-11 MED ORDER — DOLUTEGRAVIR SODIUM 50 MG PO TABS
50.0000 mg | ORAL_TABLET | Freq: Every day | ORAL | Status: DC
Start: 2020-12-11 — End: 2020-12-11

## 2020-12-11 MED ORDER — ONDANSETRON HCL 4 MG PO TABS: 4.0000 mg | ORAL_TABLET | Freq: Four times a day (QID) | ORAL | Status: DC | PRN

## 2020-12-11 NOTE — ED Notes (Signed)
Unlocked patient from the monitor patient walked to the bathroom patient did fine

## 2020-12-11 NOTE — ED Notes (Signed)
Attempted report x 2 

## 2020-12-11 NOTE — ED Notes (Signed)
Attempted report x1. 

## 2020-12-11 NOTE — ED Triage Notes (Signed)
Patient complains of intermittent exertional shortness of breath for the last several weeks. Seen by PCP, prescribed inhaler with minimal improvement in symptoms. Patient states when she has these episodes her chest feels tight. Patient denies nausea, dizziness. Patient alert, oriented, and in no apparent distress at this time.

## 2020-12-11 NOTE — ED Provider Notes (Signed)
[facility] Provider Note   CSN: [id number] Arrival date & time: 12/11/20  [id number]     History Chief Complaint  Patient presents with  . Shortness of Breath    S[facility] Hall is a 40 y.o. female.  40 year old female who presents with several weeks of increasing dyspnea on exertion as well as orthopnea.  Denies any chest pain or chest pressure.  Has had some chest tightness at times.  States that she has not had any fever but notes nonproductive cough.  Denies any lower extremity edema.  Has a history of asthma and has used her inhaler.  Denies any prior history of CHF.  No treatment use prior to arrival        Past Medical History:  Diagnosis Date  . Anemia    H/O  . Anxiety   . Depression   . Diabetes mellitus type 1 ([place])   . GERD (gastroesophageal reflux disease)   . HIV (human immunodeficiency virus infection) ([place]) 2011  . Hypertension   . Type 2 diabetes mellitus [facility])     Patient Active Problem List   Diagnosis Date Noted  . Fatigue 01/18/2019  . Tobacco dependence 10/20/2016  . Type 1 diabetes mellitus with hyperglycemia ([place]) 01/03/2016  . HTN (hypertension) 09/16/2015  . HIV disease ([place]) 09/16/2015  . Depression 09/16/2015  . Numbness and tingling of both legs below knees 06/05/2015  . 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 Insomnia 04/04/2012  . Microalbuminuria 04/04/2012  . Hyperlipidemia with target LDL less than 100 03/21/2012  . Obesity 03/21/2012  . Posttraumatic stress disorder 10/01/2011  . Anxiety disorder 07/28/2011    Past Surgical History:  Procedure Laterality Date  . CESAREAN SECTION  2001, 2008, 2009, 2011  . CHOLECYSTECTOMY    . EXCISION OF BREAST LESION N/A 01/04/2018   Procedure: EXCISION OF CHEST WALL ABSCESS. MIDDLE CHEST;  Surgeon: Olean Ree, MD;  Location: [facility] ORS;  Service: General;  Laterality: N/A;  resection of chest wall cyst / abscess  . FRACTURE SURGERY Right     HAND  . TONSILLECTOMY    . TUBAL LIGATION  2011     OB History    Gravida  5   Para      Term      Preterm      AB      Living  3     SAB      IAB      Ectopic      Multiple      Live Births              Family History  Problem Relation Age of Onset  . Diabetes Father   . Hypertension Father   . Cancer Mother        cervical     Social History   Tobacco Use  . Smoking status: Current Every Day Smoker    Packs/day: 0.50    Years: 15.00    Pack years: 7.50    Types: Cigarettes  . Smokeless tobacco: Never Used  . Tobacco comment: trying to slow  Vaping Use  . Vaping Use: Never used  Substance Use Topics  . Alcohol use: Yes    Alcohol/week: 1.0 standard drink    Types: 1 Standard drinks or equivalent per week    Comment: socially  . Drug use: No    Home Medications Prior to Admission medications   Medication Sig Start Date End Date Taking?  Authorizing Provider  Accu-Chek FastClix Lancets MISC 1 Units by Does not apply route 4 (four) times daily as needed. 10/11/19   Azzie Glatter, FNP  acetaminophen-codeine (TYLENOL #3) 300-30 MG tablet Take 1 tablet by mouth every 6 (six) hours as needed for moderate pain. Patient not taking: Reported on 07/30/2020 04/02/20   Dorena Dew, FNP  albuterol (VENTOLIN HFA) 108 (90 Base) MCG/ACT inhaler Inhale 2 puffs into the lungs every 4 (four) hours as needed for wheezing or shortness of breath. 07/30/20   Dorena Dew, FNP  ALPRAZolam Duanne Moron) 1 MG tablet  08/01/18   [provider]  ARIPiprazole (ABILIFY) 10 MG tablet  11/30/18   [provider]  atorvastatin (LIPITOR) 10 MG tablet TAKE 1 TABLET (10 MG TOTAL) BY MOUTH DAILY. Patient taking differently: Take 10 mg by mouth daily at 6 PM.  01/01/17   Dorena Dew, FNP  azithromycin (ZITHROMAX) 250 MG tablet Take 500 mg day 1. Take 250 mg on days 2-5. 07/30/20   Dorena Dew, FNP  benzonatate (TESSALON PERLES) 100 MG  capsule Take 1 capsule (100 mg total) by mouth 3 (three) times daily as needed for cough. 07/30/20 07/30/21  Dorena Dew, FNP  blood glucose meter kit and supplies KIT Dispense based on patient and insurance preference. Use up to four times daily as directed. (FOR ICD-9 250.00, 250.01). 08/21/19   [place], FNP  Blood Glucose Monitoring Suppl (ACCU-CHEK GUIDE ME) w/Device KIT 1 Units by Does not apply route 4 (four) times daily as needed. 10/11/19   Azzie Glatter, FNP  busPIRone (BUSPAR) 10 MG tablet  12/26/18   [provider]  DESCOVY 200-25 MG tablet TAKE ONE TABLET BY MOUTH DAILY 07/24/19   [place] Callas, NP  GLOBAL EASE INJECT PEN NEEDLES 32G X 4 MM MISC USE AS DIRECTED TO ADMINISTER INSULIN THREE TIMES DAILY BEFORE MEALS 08/30/19   Jegede, Olugbemiga E, MD  glucose blood (ACCU-CHEK GUIDE) test strip Use 3 times a day. Dx code: E11.9 10/17/19   Azzie Glatter, FNP  ibuprofen (ADVIL) 800 MG tablet Take 1 tablet (800 mg total) by mouth every 8 (eight) hours as needed. Patient not taking: Reported on 07/30/2020 04/02/20   Dorena Dew, FNP  insulin aspart (NOVOLOG) 100 UNIT/ML FlexPen Per sliding scale. Max daily dose 40 units. 10/10/19   Dorena Dew, FNP  Insulin Glargine (LANTUS SOLOSTAR) 100 UNIT/ML Solostar Pen INJECT 30 UNITS INTO THE SKIN DAILY AT 10 IN THE EVENING 10/10/19   Dorena Dew, FNP  lisinopril-hydrochlorothiazide (ZESTORETIC) 20-25 MG tablet TAKE 1 TABLET BY MOUTH DAILY. 10/01/20   Dorena Dew, FNP  ondansetron (ZOFRAN) 4 MG tablet Take 4 mg by mouth every 8 (eight) hours as needed for nausea or vomiting.    [provider]  propranolol (INDERAL) 10 MG tablet Take 1 tablet (10 mg total) by mouth 2 (two) times daily. 10/10/19   Dorena Dew, FNP  sertraline (ZOLOFT) 50 MG tablet  12/26/18   [provider]  sitaGLIPtin-metformin (JANUMET) 50-1000 MG tablet TAKE 1 TABLET BY MOUTH TWO (TWO) TIMES DAILY WITH A  MEAL. 10/10/19   Dorena Dew, FNP  TIVICAY 50 MG tablet TAKE ONE TABLET BY MOUTH DAILY 07/24/19   [place] Callas, NP  traZODone (DESYREL) 50 MG tablet Take 50-100 mg by mouth at bedtime as needed for sleep.  10/28/16   [provider]    Allergies    Latex  and Percocet [oxycodone-acetaminophen]  Review of Systems   Review of Systems  All other systems reviewed and are negative.   Physical Exam Updated Vital Signs BP (!) 139/97 (BP Location: Right Arm)   Pulse 96   Temp 98.1 F (36.7 C) (Oral)   Resp (!) 21   SpO2 100%   Physical Exam Vitals and nursing note reviewed.  Constitutional:      General: She is not in acute distress.    Appearance: Normal appearance. She is well-developed and well-nourished. She is not toxic-appearing.  HENT:     Head: Normocephalic and atraumatic.  Eyes:     General: Lids are normal.     Extraocular Movements: EOM normal.     Conjunctiva/sclera: Conjunctivae normal.     Pupils: Pupils are equal, round, and reactive to light.  Neck:     Thyroid: No thyroid mass.     Trachea: No tracheal deviation.  Cardiovascular:     Rate and Rhythm: Normal rate and regular rhythm.     Heart sounds: Normal heart sounds. No murmur heard. No gallop.   Pulmonary:     Effort: Pulmonary effort is normal. No respiratory distress.     Breath sounds: No stridor. Decreased breath sounds present. No wheezing, rhonchi or rales.  Abdominal:     General: Bowel sounds are normal. There is no distension.     Palpations: Abdomen is soft.     Tenderness: There is no abdominal tenderness. There is no CVA tenderness or rebound.  Musculoskeletal:        General: No tenderness or edema. Normal range of motion.     Cervical back: Normal range of motion and neck supple.  Skin:    General: Skin is warm and dry.     Findings: No abrasion or rash.  Neurological:     Mental Status: She is alert and oriented to person, place, and time.     GCS: GCS eye subscore  is 4. GCS verbal subscore is 5. GCS motor subscore is 6.     Cranial Nerves: No cranial nerve deficit.     Sensory: No sensory deficit.     Deep Tendon Reflexes: Strength normal.  Psychiatric:        Mood and Affect: Mood and affect normal.        Speech: Speech normal.        Behavior: Behavior normal.     ED Results / Procedures / Treatments   Labs (all labs ordered are listed, but only abnormal results are displayed) Labs Reviewed  BASIC METABOLIC PANEL - Abnormal; Notable for the following components:      Result Value   Sodium 132 (*)    CO2 19 (*)    Glucose, Bld 309 (*)    Calcium 8.7 (*)    All other components within normal limits  CBC - Abnormal; Notable for the following components:   RBC 3.77 (*)    HCT 35.9 (*)    All other components within normal limits  SARS CORONAVIRUS 2 (TAT 6-24 HRS)  BRAIN NATRIURETIC PEPTIDE  I-STAT BETA HCG BLOOD, ED ([facility], [facility], [facility] ONLY)    EKG EKG Interpretation  Date/Time:  Wednesday December 11 2020 09:16:50 EST Ventricular Rate:  113 PR Interval:  150 QRS Duration: 86 QT Interval:  348 QTC Calculation: 477 R Axis:   99 Text Interpretation: Sinus tachycardia Possible Left atrial enlargement Rightward axis Borderline ECG Confirmed by Lacretia Leigh ([id number]) on 12/11/2020 11:14:06 AM  Radiology DG Chest 2 View  Result Date: 12/11/2020 CLINICAL DATA:  40 year old female with shortness of breath for several weeks. EXAM: CHEST - 2 VIEW COMPARISON:  Chest radiographs 01/15/2013. FINDINGS: PA and lateral views. Lung volumes and mediastinal contours have not significantly changed since 2014. Visualized tracheal air column is within normal limits. No pneumothorax. No layering pleural effusion. However, suggestion of increased pleural fluid in the fissures compared to prior, including the right minor fissure. Superimposed widespread increased bilateral pulmonary interstitial opacity. No consolidation or confluent opacity. No acute osseous  abnormality identified.  Paucity of bowel gas. IMPRESSION: Constellation suspicious for acute pulmonary interstitial edema with trace pleural fluid in the fissures. Viral/atypical respiratory infection felt less likely. Electronically Signed   By: Genevie Ann M.D.   On: 12/11/2020 09:55    Procedures Procedures   Medications Ordered in ED Medications  furosemide (LASIX) injection 40 mg (has no administration in time range)    ED Course  I have reviewed the triage vital signs and the nursing notes.  Pertinent labs & imaging results that were available during my care of the patient were reviewed by me and considered in my medical decision making (see chart for details).    MDM Rules/Calculators/A&P                          Chest x-ray clinical presentation consistent with CHF.  Troponin negative here.  Given Lasix and will admit to medicine service Final Clinical Impression(s) / ED Diagnoses Final diagnoses:  None    Rx / DC Orders ED Discharge Orders    None       Lacretia Leigh, MD 12/11/20 1321

## 2020-12-11 NOTE — Progress Notes (Signed)
Pick up from IM resident service for 2/24  Signed out from Dr. Mardella Layman   39 yo F with HTN, HIV not on antiviral, uncontrolled Type 2 DM (9.8% HbA1C) who presented with dyspnea with exertion, orthopnea, LE swelling. CXR with likely interstitial edema. BNP of 171. Troponin flat at 12. Concerning for new acute onset CHF. Started on IV Lasix. Echo pending.   Later also found to be COVID positive with elevated d-dimer. CTA pending. IV Remdesivir started. Stable on room air at admission.

## 2020-12-11 NOTE — ED Notes (Signed)
Attempted report x4

## 2020-12-11 NOTE — H&P (Cosign Needed Addendum)
Date: 12/11/2020               Patient Name:  [facility] MRN: [id number]  DOB: 1981-02-25 Age / Sex: 40 y.o., female   PCP: Massie Maroon, FNP         Medical Service: Internal Medicine Teaching Service         Attending Physician: Dr. Sandre Kitty Elwin Mocha, MD    First Contact: Dr. Cyndie Chime Pager: [phone]  Second Contact: Dr. Karilyn Cota Pager: [phone]       After Hours (After 5p/  First Contact Pager: [phone]  weekends / holidays): Second Contact Pager: [phone]   Chief Complaint: shortness of breath  History of Present Illness: [facility] is a 5 yoF with a medical history significant for HTN, type 2 DM, HIV, tobacco use who presented to the ED for worsening dyspnea with exertion.  She has been having trouble breathing since November. In October, she had an URI and this happened again in November. She said her breathing has gotten so severe that any movement makes it worse. She endorses orthopnea, which she has to stack pillows in order to sleep. Endorses mile LE swelling. Endorses worsening of her LE numbness and tingling. She said she feels tightness in her thighs/hips. Endorses chest tightness, dry cough.  Endorses intermittent wheezing.  Denies fevers, chills, sore throat, runny nose, diarrhea, abdominal pain, headaches, dizziness, n/v. Has issues with constipation. No issues with urinating.   Patient reports diagnosed with bronchitis in November and she finished antibiotics course. She has an Albulterol inhaler and uses it PRN.  Patient's not on home oxygen.  Patient denies cardiac or pulmonary history except for hypertension.  Patient states that her diabetes is not well controlled.  She was in a lot of stress and states that "my body is falling apart" in the last few years due to the death of her 17 yo son.  Patient denies any recent traveling or prolonged trips.  Endorses limited immobility due to her dyspnea.  She is not on birth control.  Patient  states that her mom has cervical cancer and passed away from a blood clots in the lungs.  Reports 13 years of smoking, smoke one half a pack a day, just quit 2 weeks ago.    In the ED: Chest x-ray suspect acute interstitial edema.  She was given IV Lasix 40 mg.  BNP elevated 171.  Initial troponin 12.  EKG negative for ST elevation.  CBC unremarkable.  CMP showed hyponatremia 132, corrected sodium 135.  Also showed non-anion gap metabolic acidosis with hyperglycemia of 309.  Meds:   Current Meds  Medication Sig  . albuterol (VENTOLIN HFA) 108 (90 Base) MCG/ACT inhaler Inhale 2 puffs into the lungs every 4 (four) hours as needed for wheezing or shortness of breath.  . ALPRAZolam (XANAX) 1 MG tablet Take 1 mg by mouth 2 (two) times daily as needed for anxiety (panic attack).  . benzonatate (TESSALON PERLES) 100 MG capsule Take 1 capsule (100 mg total) by mouth 3 (three) times daily as needed for cough.  Marland Kitchen buPROPion (WELLBUTRIN XL) 300 MG 24 hr tablet Take 300 mg by mouth daily.  . busPIRone (BUSPAR) 10 MG tablet Take 10 mg by mouth 3 (three) times daily.  . insulin aspart (NOVOLOG) 100 UNIT/ML FlexPen Per sliding scale. Max daily dose 40 units. (Patient taking differently: Inject 2-10 Units into the skin See admin instructions. Injects 2-10 units into the skin per sliding scale.  Max daily dose 40 units. 150-199 mg/dl : 2 units; 614-431 mg/dL: 4 units; 540 - 086 mg/dL: 6 units; 761 - 950 mg/dL: 8 units; 932 -671 mg/ dL: 10 units)  . Insulin Glargine (LANTUS SOLOSTAR) 100 UNIT/ML Solostar Pen INJECT 30 UNITS INTO THE SKIN DAILY AT 10 IN THE EVENING (Patient taking differently: Inject 30 Units into the skin daily.)  . lisinopril-hydrochlorothiazide (ZESTORETIC) 20-25 MG tablet TAKE 1 TABLET BY MOUTH DAILY.  Marland Kitchen sertraline (ZOLOFT) 100 MG tablet Take by mouth daily.  . sitaGLIPtin-metformin (JANUMET) 50-1000 MG tablet TAKE 1 TABLET BY MOUTH TWO (TWO) TIMES DAILY WITH A MEAL. (Patient taking differently:  Take 1 tablet by mouth daily.)  . traZODone (DESYREL) 50 MG tablet Take 50-100 mg by mouth at bedtime as needed for sleep.      Allergies: Allergies as of 12/11/2020 - Review Complete 12/11/2020  Allergen Reaction Noted  . Percocet [oxycodone-acetaminophen] Hives, Itching, and Rash 01/15/2013  . Latex Rash 01/15/2013   Past Medical History:  Diagnosis Date  . Anemia    H/O  . Anxiety   . Depression   . Diabetes mellitus type 1 ([facility])   . GERD (gastroesophageal reflux disease)   . HIV (human immunodeficiency virus infection) ([facility]) 2011  . Hypertension   . Type 2 diabetes mellitus ([facility])     Family History:  Family History  Problem Relation Age of Onset  . Diabetes Father   . Hypertension Father   . Cancer Mother        cervical     Social History: Quit smoking 2 weeks ago. Smoked 1/2 pack a day for 15 years. Socially drinks alcohol. Smokes marijuana on and off, smoked last two weeks ago. Lives at home with her kids.   Review of Systems: A complete ROS was negative except as per HPI.   Physical Exam: Blood pressure (!) 162/92, pulse 93, temperature 98.1 F (36.7 C), temperature source Oral, resp. rate (!) 21, SpO2 100 %.  Physical Exam Constitutional:      General: She is not in acute distress.    Appearance: She is obese.  HENT:     Head: Normocephalic.  Eyes:     General:        Right eye: No discharge.        Left eye: No discharge.  Cardiovascular:     Rate and Rhythm: Regular rhythm. Tachycardia present.  Pulmonary:     Effort: Pulmonary effort is normal. No respiratory distress.     Breath sounds: Normal breath sounds.  Abdominal:     General: Bowel sounds are normal.     Tenderness: There is no abdominal tenderness.  Musculoskeletal:     Cervical back: Normal range of motion.     Right lower leg: Edema (+1) present.     Left lower leg: Edema (+1) present.  Skin:    General: Skin is warm.  Neurological:     Mental Status: She is alert.   Psychiatric:        Mood and Affect: Mood normal.     EKG: personally reviewed my interpretation is sinus tachycardia  CXR: personally reviewed my interpretation is bilateral interstitial edema  Assessment & Plan by Problem: Active Problems:   Dyspnea  [facility] is a 29 yoF with a medical history significant for HTN, type 2 DM, HIV, tobacco use who presented to the ED for worsening dyspnea with exertion, suspected to be new onset heart failure vs COPD exacerbation vs PE.  Dyspnea with exertion New onset heart failure VS COPD exacerbation VS PE Patient report few months history of dyspnea with exertion.  Also reports orthopnea and lower extremity swelling.  Chest x-ray showed bilateral interstitial edema concerning for heart failure.  She does not have history of cardiac issues in the past.  She is high risk for CAD given her uncontrolled diabetes and hypertension.  No past echocardiogram.  We will obtain an echo.  In the meantime we will continue diuresis with IV Lasix. Lower suspicion for COPD exacerbation with the absence of increased productive coughing or wheezing.  Patient has high risk for COPD due to her smoking history.  Will start albuterol inhaler as needed. PEs on the differentials given her history of immobility, tachycardia, and dyspnea.  Wells score of 1.5, put her at low risk for PE.  Obtain D-dimer, low threshold to obtain CTA if D-dimer is elevated. -Pending echocardiogram.  Will consult cardiology if new onset of heart failure for ischemic work-up -Continue IV Lasix 40 mg daily -Pending D-dimer.  CTA if elevated D-dimer -Strict I/O -Daily weight   Hypertension Resume HCTZ and lisinopril   Type 2 diabetes Hemoglobin A1c 9.9 8 months ago.  Home regimen: Janumet, 30 units Lantus daily with NovoLog sliding scale. -Lantus 10 units daily -Sliding scale -Monitor CBG   Depression Anxiety Resume BuSpar, trazodone and Xanax   HIV According pharmacy,  patient is not taking Descovy  Code: Full Diet: Heart/carb IV fluid: None DVT: Lovenox  Dispo: Admit patient to Inpatient with expected length of stay greater than 2 midnights.  Signed: Doran Stabler, DO 12/11/2020, 4:06 PM  Pager: [phone] After 5pm on weekdays and 1pm on weekends: On Call pager: [phone]

## 2020-12-11 NOTE — ED Notes (Signed)
Patient transported to CT 

## 2020-12-11 NOTE — ED Notes (Signed)
Attempted report x 3.  

## 2020-12-12 ENCOUNTER — Inpatient Hospital Stay (HOSPITAL_COMMUNITY): Payer: Medicare Other

## 2020-12-12 ENCOUNTER — Other Ambulatory Visit: Payer: Self-pay | Admitting: Internal Medicine

## 2020-12-12 DIAGNOSIS — R0602 Shortness of breath: Secondary | ICD-10-CM

## 2020-12-12 DIAGNOSIS — Z8616 Personal history of COVID-19: Secondary | ICD-10-CM

## 2020-12-12 DIAGNOSIS — E1169 Type 2 diabetes mellitus with other specified complication: Secondary | ICD-10-CM | POA: Diagnosis not present

## 2020-12-12 DIAGNOSIS — I5031 Acute diastolic (congestive) heart failure: Secondary | ICD-10-CM

## 2020-12-12 DIAGNOSIS — B20 Human immunodeficiency virus [HIV] disease: Secondary | ICD-10-CM

## 2020-12-12 DIAGNOSIS — I10 Essential (primary) hypertension: Secondary | ICD-10-CM

## 2020-12-12 DIAGNOSIS — U071 COVID-19: Secondary | ICD-10-CM

## 2020-12-12 DIAGNOSIS — I5043 Acute on chronic combined systolic (congestive) and diastolic (congestive) heart failure: Secondary | ICD-10-CM

## 2020-12-12 LAB — C-REACTIVE PROTEIN: CRP: 1.5 mg/dL — ABNORMAL HIGH (ref <1.0)

## 2020-12-12 LAB — BASIC METABOLIC PANEL
Anion gap: 9 (ref 5–15)
BUN: 17 mg/dL (ref 6–20)
CO2: 23 mmol/L (ref 22–32)
Calcium: 8.2 mg/dL — ABNORMAL LOW (ref 8.9–10.3)
Chloride: 102 mmol/L (ref 98–111)
Creatinine, Ser: 1.14 mg/dL — ABNORMAL HIGH (ref 0.44–1.00)
GFR, Estimated: >60 mL/min (ref >60)
Glucose, Bld: 258 mg/dL — ABNORMAL HIGH (ref 70–99)
Potassium: 3.5 mmol/L (ref 3.5–5.1)
Sodium: 134 mmol/L — ABNORMAL LOW (ref 135–145)

## 2020-12-12 LAB — CBC
HCT: 31.4 % — ABNORMAL LOW (ref 36.0–46.0)
Hemoglobin: 10.8 g/dL — ABNORMAL LOW (ref 12.0–15.0)
MCH: 32.2 pg (ref 26.0–34.0)
MCHC: 34.4 g/dL (ref 30.0–36.0)
MCV: 93.7 fL (ref 80.0–100.0)
Platelets: 174 10*3/uL (ref 150–400)
RBC: 3.35 MIL/uL — ABNORMAL LOW (ref 3.87–5.11)
RDW: 13.5 % (ref 11.5–15.5)
WBC: 7 10*3/uL (ref 4.0–10.5)
nRBC: 0 % (ref 0.0–0.2)

## 2020-12-12 LAB — D-DIMER, QUANTITATIVE: D-Dimer, Quant: 2.09 ug/mL-FEU — ABNORMAL HIGH (ref 0.00–0.50)

## 2020-12-12 LAB — ECHOCARDIOGRAM COMPLETE
Area-P 1/2: 5.02 cm2
Calc EF: 38.3 %
Height: 60 in
S' Lateral: 4.8 cm
Single Plane A2C EF: 33.5 %
Single Plane A4C EF: 39.5 %
Weight: 4320 oz

## 2020-12-12 LAB — BRAIN NATRIURETIC PEPTIDE: B Natriuretic Peptide: 127.6 pg/mL — ABNORMAL HIGH (ref 0.0–100.0)

## 2020-12-12 LAB — GLUCOSE, CAPILLARY
Glucose-Capillary: 171 mg/dL — ABNORMAL HIGH (ref 70–99)
Glucose-Capillary: 127 mg/dL — ABNORMAL HIGH (ref 70–99)
Glucose-Capillary: 174 mg/dL — ABNORMAL HIGH (ref 70–99)
Glucose-Capillary: 164 mg/dL — ABNORMAL HIGH (ref 70–99)

## 2020-12-12 LAB — T-HELPER CELLS (CD4) COUNT (NOT AT ARMC)
CD4 % Helper T Cell: 24 % — ABNORMAL LOW (ref 33–65)
CD4 T Cell Abs: 570 /uL (ref 400–1790)

## 2020-12-12 MED ORDER — IRBESARTAN 300 MG PO TABS
300.0000 mg | ORAL_TABLET | Freq: Every day | ORAL | Status: DC
  Administered 2020-12-13: 300 mg via ORAL
  Filled 2020-12-12: qty 1

## 2020-12-12 MED ORDER — BIKTARVY 50-200-25 MG PO TABS: 1.0000 | ORAL_TABLET | Freq: Every day | ORAL | 0 refills | Status: DC

## 2020-12-12 MED ORDER — BICTEGRAVIR-EMTRICITAB-TENOFOV 50-200-25 MG PO TABS
1.0000 | ORAL_TABLET | Freq: Every day | ORAL | Status: DC
  Administered 2020-12-12 – 2020-12-18 (×7): 1 via ORAL
  Filled 2020-12-12 (×8): qty 1

## 2020-12-12 MED FILL — BIKTARVY 50-200-25 MG TABS: 50-200-25 | 30 days supply | Qty: 30 | Fill #0

## 2020-12-12 NOTE — Progress Notes (Signed)
  Echocardiogram 2D Echocardiogram has been performed.  Janalyn Harder 12/12/2020, 11:38 AM

## 2020-12-12 NOTE — Consult Note (Signed)
Cardiology Consultation:   Patient ID: Sherry Hall MRN: [id number]; DOB: 1980-10-30  Admit date: 12/11/2020 Date of Consult: 12/12/2020  PCP:  Dorena Dew, [facility]  Cardiologist:  Jax Kentner , new  Advanced Practice Provider:  No care team member to display Electrophysiologist:  None     Patient Profile:   Sherry Hall is a 40 y.o. female with a hx of  HIV, morbid obesity, dy  who is being seen today for the evaluation of covid related diastolic CHF  at the request of  Dr.  Oren Binet .  History of Present Illness:   Sherry Hall  Is a 40 yo with hx of morbid obesity, hypertension, type 2 diabetes mellitus, tobacco abuse HIV ( noncompliant with anti virals)  She has been having increasing shortness of breath since November.  She has orthopnea and PND.  She has mild left leg swelling.  Echocardiogram ( difficult study with poor image quality due to obesity)  performed earlier today reveals moderate left ventricular dysfunction with a LVEF of 35 to 40%.  She has akinesis of the inferior wall basal anteroseptal segment and mid inferior septal segment.  She has grade 2 diastolic dysfunction.  Right ventricle is normal size.  Sherry Hall has been short of breath for the past several months.  She had an episode of chest tightness approximately 1 month ago.  She went to the pharmacy to check her blood pressure and was told that if the pain started to go up into her jaw that she should see her doctor.  The pain lasted for several hours.  She has been short of breath and has had intermittent chest tightness since that time.  She is not had any further episodes of the chest heaviness.  She does have PND and orthopnea.  She has been propping herself up with several pillows at night.  She eats a very high salt diet.  She has been compliant with most of her medications but has been skipping her antiviral HIV medicines.  She has been taking her  hypertensive and diabetic medicines.  She has been getting IV Lasix for the past couple days.  She is diuresed quite a bit although the nurses are not keeping up with her ins and outs.   Past Medical History:  Diagnosis Date  . Anemia    H/O  . Anxiety   . Depression   . Diabetes mellitus type 1 ([place])   . GERD (gastroesophageal reflux disease)   . HIV (human immunodeficiency virus infection) ([place]) 2011  . Hypertension   . Type 2 diabetes mellitus ([place])     Past Surgical History:  Procedure Laterality Date  . CESAREAN SECTION  2001, 2008, 2009, 2011  . CHOLECYSTECTOMY    . EXCISION OF BREAST LESION N/A 01/04/2018   Procedure: EXCISION OF CHEST WALL ABSCESS. MIDDLE CHEST;  Surgeon: Olean Ree, MD;  Location: [facility] ORS;  Service: General;  Laterality: N/A;  resection of chest wall cyst / abscess  . FRACTURE SURGERY Right    HAND  . TONSILLECTOMY    . TUBAL LIGATION  2011     Home Medications:  Prior to Admission medications   Medication Sig Start Date End Date Taking? Authorizing Provider  albuterol (VENTOLIN HFA) 108 (90 Base) MCG/ACT inhaler Inhale 2 puffs into the lungs every 4 (four) hours as needed for wheezing or shortness of breath. 07/30/20  Yes Dorena Dew, FNP  ALPRAZolam Duanne Moron) 1 MG tablet Take  1 mg by mouth 2 (two) times daily as needed for anxiety (panic attack). 08/01/18  Yes [provider]  benzonatate (TESSALON PERLES) 100 MG capsule Take 1 capsule (100 mg total) by mouth 3 (three) times daily as needed for cough. 07/30/20 07/30/21 Yes Dorena Dew, FNP  bictegravir-emtricitabine-tenofovir AF (BIKTARVY) 50-200-25 MG TABS tablet Take 1 tablet by mouth daily. 12/12/20  Yes Ghimire, Henreitta Leber, MD  buPROPion (WELLBUTRIN XL) 300 MG 24 hr tablet Take 300 mg by mouth daily. 12/10/20  Yes [provider]  busPIRone (BUSPAR) 10 MG tablet Take 10 mg by mouth 3 (three) times daily. 12/26/18  Yes [provider]  insulin aspart (NOVOLOG)  100 UNIT/ML FlexPen Per sliding scale. Max daily dose 40 units. Patient taking differently: Inject 2-10 Units into the skin See admin instructions. Injects 2-10 units into the skin per sliding scale. Max daily dose 40 units. 150-199 mg/dl : 2 units; 200-249 mg/dL: 4 units; 250 - 299 mg/dL: 6 units; 300 - 349 mg/dL: 8 units; 350 -399 mg/ dL: 10 units 10/10/19  Yes Cammie Sickle M, FNP  Insulin Glargine (LANTUS SOLOSTAR) 100 UNIT/ML Solostar Pen INJECT 30 UNITS INTO THE SKIN DAILY AT 10 IN THE EVENING Patient taking differently: Inject 30 Units into the skin daily. 10/10/19  Yes Dorena Dew, FNP  lisinopril-hydrochlorothiazide (ZESTORETIC) 20-25 MG tablet TAKE 1 TABLET BY MOUTH DAILY. 10/01/20  Yes Dorena Dew, FNP  sertraline (ZOLOFT) 100 MG tablet Take by mouth daily. 12/26/18  Yes [provider]  sitaGLIPtin-metformin (JANUMET) 50-1000 MG tablet TAKE 1 TABLET BY MOUTH TWO (TWO) TIMES DAILY WITH A MEAL. Patient taking differently: Take 1 tablet by mouth daily. 10/10/19  Yes Dorena Dew, FNP  traZODone (DESYREL) 50 MG tablet Take 50-100 mg by mouth at bedtime as needed for sleep.  10/28/16  Yes [provider]  Accu-Chek FastClix Lancets MISC 1 Units by Does not apply route 4 (four) times daily as needed. 10/11/19   Azzie Glatter, FNP  atorvastatin (LIPITOR) 10 MG tablet TAKE 1 TABLET (10 MG TOTAL) BY MOUTH DAILY. Patient not taking: Reported on 12/11/2020 01/01/17   Dorena Dew, FNP  blood glucose meter kit and supplies KIT Dispense based on patient and insurance preference. Use up to four times daily as directed. (FOR ICD-9 250.00, 250.01). 08/21/19   Golden Circle, FNP  Blood Glucose Monitoring Suppl (ACCU-CHEK GUIDE ME) w/Device KIT 1 Units by Does not apply route 4 (four) times daily as needed. 10/11/19   Azzie Glatter, FNP  DESCOVY 200-25 MG tablet TAKE ONE TABLET BY MOUTH DAILY Patient not taking: No sig reported 07/24/19   Appling Callas,  NP  GLOBAL EASE INJECT PEN NEEDLES 32G X 4 MM MISC USE AS DIRECTED TO ADMINISTER INSULIN THREE TIMES DAILY BEFORE MEALS 08/30/19   Jegede, Olugbemiga E, MD  glucose blood (ACCU-CHEK GUIDE) test strip Use 3 times a day. Dx code: E11.9 10/17/19   Azzie Glatter, FNP  TIVICAY 50 MG tablet TAKE ONE TABLET BY MOUTH DAILY Patient not taking: No sig reported 07/24/19   Pegram Callas, NP    Inpatient Medications: Scheduled Meds: . bictegravir-emtricitabine-tenofovir AF  1 tablet Oral Daily  . busPIRone  10 mg Oral TID  . enoxaparin (LOVENOX) injection  40 mg Subcutaneous Q24H  . furosemide  40 mg Intravenous Daily  . insulin aspart  0-15 Units Subcutaneous TID WC  . insulin glargine  15 Units Subcutaneous QHS  . lisinopril  20 mg Oral Daily   Continuous Infusions: . remdesivir 100 mg in NS 100 mL Stopped (12/12/20 0937)   PRN Meds: acetaminophen **OR** acetaminophen, albuterol, ALPRAZolam, benzonatate, ondansetron **OR** ondansetron (ZOFRAN) IV, polyethylene glycol, traZODone  Allergies:    Allergies  Allergen Reactions  . Percocet [Oxycodone-Acetaminophen] Hives, Itching and Rash  . Latex Rash    Social History:   Social History   Socioeconomic History  . Marital status: Single    Spouse name: Not on file  . Number of children: Not on file  . Years of education: Not on file  . Highest education level: Not on file  Occupational History  . Not on file  Tobacco Use  . Smoking status: Current Every Day Smoker    Packs/day: 0.50    Years: 15.00    Pack years: 7.50    Types: Cigarettes  . Smokeless tobacco: Never Used  . Tobacco comment: trying to slow  Vaping Use  . Vaping Use: Never used  Substance and Sexual Activity  . Alcohol use: Yes    Alcohol/week: 1.0 standard drink    Types: 1 Standard drinks or equivalent per week    Comment: socially  . Drug use: No  . Sexual activity: Yes    Partners: Male    Birth control/protection: Surgical  Other Topics Concern   . Not on file  Social History Narrative  . Not on file   Social Determinants of Health   Financial Resource Strain: Not on file  Food Insecurity: Not on file  Transportation Needs: Not on file  Physical Activity: Not on file  Stress: Not on file  Social Connections: Not on file  Intimate Partner Violence: Not on file    Family History:    Family History  Problem Relation Age of Onset  . Diabetes Father   . Hypertension Father   . Cancer Mother        cervical      ROS:  Please see the history of present illness.   All other ROS reviewed and negative.     Physical Exam/Data:   Vitals:   12/11/20 2340 12/12/20 0436 12/12/20 0854 12/12/20 1222  BP: 119/83 128/81  (!) 133/98  Pulse: 100 94  94  Resp: _0 Temp: 98.3 F (36.8 C) 98.2 F (36.8 C)  99.2 F (37.3 C)  TempSrc: Axillary Axillary  Oral  SpO2: 97% 96% 100% 99%  Weight:      Height:        Intake/Output Summary (Last 24 hours) at 12/12/2020 1637 Last data filed at 12/12/2020 1100 Gross per 24 hour  Intake 340 ml  Output --  Net 340 ml   Last 3 Weights 12/11/2020 04/02/2020 10/10/2019  Weight (lbs) 270 lb 280 lb 274 lb 9.6 oz  Weight (kg) 122.471 kg 127.007 kg 124.558 kg     Body mass index is 52.73 kg/m.  General: Young, morbidly obese female, no acute distress.  She is feeling much better by her report. HEENT: normal Lymph: no adenopathy Neck: She has a relatively thick neck.  I was not able to assess her JVD. Endocrine:  No thryomegaly Vascular: No carotid bruits; FA pulses 2+ bilaterally without bruits  Cardiac: Distant heart sounds.  Normal S1-S2.  I did not hear any significant murmur. Lungs: Distant breath sounds.  There was no rales. Abd: soft, nontender, no hepatomegaly  Ext: no edema Musculoskeletal:  No deformities, BUE and BLE strength normal and equal Skin: warm  and dry  Neuro:  CNs 2-12 intact, no focal abnormalities noted Psych:  Normal affect   EKG:  The EKG was  personally reviewed and demonstrates: Sinus tachycardia at 113 beats minute.  She has no ST or T wave changes.  She has no Q waves. Telemetry:  Telemetry was personally reviewed and demonstrates: Sinus rhythm  Relevant CV Studies:   Laboratory Data:  High Sensitivity Troponin:   Recent Labs  Lab 12/11/20 1206 12/11/20 1600  TROPONINIHS 12 12     Chemistry Recent Labs  Lab 12/11/20 0919 12/12/20 0214  NA 132* 134*  K 4.1 3.5  CL 103 102  CO2 19* 23  GLUCOSE 309* 258*  BUN 18 17  CREATININE 0.94 1.14*  CALCIUM 8.7* 8.2*  GFRNONAA >60 >60  ANIONGAP 10 9    No results for input(s): PROT, ALBUMIN, AST, ALT, ALKPHOS, BILITOT in the last 168 hours. Hematology Recent Labs  Lab 12/11/20 0919 12/12/20 0214  WBC 7.6 7.0  RBC 3.77* 3.35*  HGB 12.1 10.8*  HCT 35.9* 31.4*  MCV 95.2 93.7  MCH 32.1 32.2  MCHC 33.7 34.4  RDW 13.6 13.5  PLT 194 174   BNP Recent Labs  Lab 12/11/20 1206 12/12/20 0806  BNP 171.0* 127.6*    DDimer  Recent Labs  Lab 12/11/20 1622 12/12/20 0806  DDIMER 2.49* 2.09*     Radiology/Studies:  DG Chest 2 View  Result Date: 12/11/2020 CLINICAL DATA:  40 year old female with shortness of breath for several weeks. EXAM: CHEST - 2 VIEW COMPARISON:  Chest radiographs 01/15/2013. FINDINGS: PA and lateral views. Lung volumes and mediastinal contours have not significantly changed since 2014. Visualized tracheal air column is within normal limits. No pneumothorax. No layering pleural effusion. However, suggestion of increased pleural fluid in the fissures compared to prior, including the right minor fissure. Superimposed widespread increased bilateral pulmonary interstitial opacity. No consolidation or confluent opacity. No acute osseous abnormality identified.  Paucity of bowel gas. IMPRESSION: Constellation suspicious for acute pulmonary interstitial edema with trace pleural fluid in the fissures. Viral/atypical respiratory infection felt less likely.  Electronically Signed   By: Genevie Ann M.D.   On: 12/11/2020 09:55   CT ANGIO CHEST PE W OR WO CONTRAST  Result Date: 12/11/2020 CLINICAL DATA:  COVID positive with exertional shortness of breath. EXAM: CT ANGIOGRAPHY CHEST WITH CONTRAST TECHNIQUE: Multidetector CT imaging of the chest was performed using the standard protocol during bolus administration of intravenous contrast. Multiplanar CT image reconstructions and MIPs were obtained to evaluate the vascular anatomy. CONTRAST:  57m OMNIPAQUE IOHEXOL 350 MG/ML SOLN COMPARISON:  None. FINDINGS: Cardiovascular: Satisfactory opacification of the pulmonary arteries to the segmental level. No evidence of pulmonary embolism. Normal heart size. No pericardial effusion. Mediastinum/Nodes: No enlarged mediastinal, hilar, or axillary lymph nodes. Thyroid gland, trachea, and esophagus demonstrate no significant findings. Lungs/Pleura: Very mild atelectasis and/or early infiltrate is seen within the posterior aspect of the bilateral lung bases. Small bilateral pleural effusions are noted. There is no evidence of a pneumothorax. Upper Abdomen: Multiple surgical clips are seen within the gallbladder fossa. Musculoskeletal: No chest wall abnormality. No acute or significant osseous findings. Review of the MIP images confirms the above findings. IMPRESSION: 1. No CT evidence of pulmonary embolism. 2. Very mild posterior bilateral lower lobe atelectasis and/or early infiltrate. 3. Small bilateral pleural effusions. 4. Evidence of prior cholecystectomy. Electronically Signed   By: TVirgina NorfolkM.D.   On: 12/11/2020 19:01   ECHOCARDIOGRAM COMPLETE  Result Date:  12/12/2020    ECHOCARDIOGRAM REPORT   Patient Name:   Sherry Hall Date of Exam: 12/12/2020 Medical Rec #:  [id number]           Height:       60.0 in Accession #:    [id number]          Weight:       270.0 lb Date of Birth:  11/22/1980          BSA:          2.121 m Patient Age:    39 years            BP:            128/81 mmHg Patient Gender: F                   HR:           95 bpm. Exam Location:  Inpatient Procedure: 2D Echo, Cardiac Doppler and Color Doppler Indications:    R06.02 SOB  History:        Patient has no prior history of Echocardiogram examinations.                 Signs/Symptoms:Shortness of Breath and Dyspnea; Risk                 Factors:Current Smoker, Hypertension and Diabetes. Covid                 positive.  Sonographer:    Roseanna Rainbow RDCS Referring Phys: [place]  Sonographer Comments: Patient is morbidly obese and Technically difficult study due to poor echo windows. Image acquisition challenging due to patient body habitus. IMPRESSIONS  1. Left ventricular ejection fraction, by estimation, is 35 to 40%. The left ventricle has moderately decreased function. The left ventricle demonstrates global hypokinesis. There is mild left ventricular hypertrophy. Left ventricular diastolic parameters are consistent with Grade II diastolic dysfunction (pseudonormalization).  2. Right ventricular systolic function is normal. The right ventricular size is normal.  3. The mitral valve is normal in structure. Trivial mitral valve regurgitation. No evidence of mitral stenosis.  4. The aortic valve is normal in structure. Aortic valve regurgitation is not visualized. No aortic stenosis is present.  5. The inferior vena cava is normal in size with greater than 50% respiratory variability, suggesting right atrial pressure of 3 mmHg. FINDINGS  Left Ventricle: Left ventricular ejection fraction, by estimation, is 35 to 40%. The left ventricle has moderately decreased function. The left ventricle demonstrates global hypokinesis. The left ventricular internal cavity size was normal in size. There is mild left ventricular hypertrophy. Left ventricular diastolic parameters are consistent with Grade II diastolic dysfunction (pseudonormalization).  LV Wall Scoring: The inferior wall, basal anteroseptal segment,  and mid inferoseptal segment are akinetic. Right Ventricle: The right ventricular size is normal. No increase in right ventricular wall thickness. Right ventricular systolic function is normal. Left Atrium: Left atrial size was normal in size. Right Atrium: Right atrial size was normal in size. Pericardium: There is no evidence of pericardial effusion. Mitral Valve: The mitral valve is normal in structure. Trivial mitral valve regurgitation. No evidence of mitral valve stenosis. Tricuspid Valve: The tricuspid valve is normal in structure. Tricuspid valve regurgitation is not demonstrated. No evidence of tricuspid stenosis. Aortic Valve: The aortic valve is normal in structure. Aortic valve regurgitation is not visualized. No aortic stenosis is present. Pulmonic Valve: The pulmonic valve was normal in structure. Pulmonic valve regurgitation  is not visualized. No evidence of pulmonic stenosis. Aorta: The aortic root is normal in size and structure. Venous: The inferior vena cava is normal in size with greater than 50% respiratory variability, suggesting right atrial pressure of 3 mmHg. IAS/Shunts: No atrial level shunt detected by color flow Doppler.  LEFT VENTRICLE PLAX 2D LVIDd:         5.00 cm      Diastology LVIDs:         4.80 cm      LV e' medial:    5.87 cm/s LV PW:         1.00 cm      LV E/e' medial:  20.8 LV IVS:        1.30 cm      LV e' lateral:   5.87 cm/s LVOT diam:     1.80 cm      LV E/e' lateral: 20.8 LV SV:         55 LV SV Index:   26 LVOT Area:     2.54 cm  LV Volumes (MOD) LV vol d, MOD A2C: 98.3 ml LV vol d, MOD A4C: 120.0 ml LV vol s, MOD A2C: 65.4 ml LV vol s, MOD A4C: 72.6 ml LV SV MOD A2C:     32.9 ml LV SV MOD A4C:     120.0 ml LV SV MOD BP:      43.0 ml RIGHT VENTRICLE             IVC RV S prime:     11.40 cm/s  IVC diam: 1.40 cm TAPSE (M-mode): 2.0 cm LEFT ATRIUM             Index       RIGHT ATRIUM           Index LA diam:        3.40 cm 1.60 cm/m  RA Area:     12.10 cm LA Vol (A2C):    38.6 ml 18.20 ml/m RA Volume:   24.20 ml  11.41 ml/m LA Vol (A4C):   39.0 ml 18.39 ml/m LA Biplane Vol: 39.3 ml 18.53 ml/m  AORTIC VALVE LVOT Vmax:   122.00 cm/s LVOT Vmean:  88.200 cm/s LVOT VTI:    0.216 m  AORTA Ao Root diam: 2.60 cm Ao Asc diam:  2.90 cm MITRAL VALVE MV Area (PHT): 5.02 cm     SHUNTS MV Decel Time: 151 msec     Systemic VTI:  0.22 m MV E velocity: 122.00 cm/s  Systemic Diam: 1.80 cm MV A velocity: 80.40 cm/s MV E/A ratio:  1.52 Candee Furbish MD Electronically signed by Candee Furbish MD Signature Date/Time: 12/12/2020/12:49:32 PM    Final      Assessment and Plan:   1. 1.  Acute on chronic combined systolic and diastolic congestive heart failure: Her echocardiogram was very difficult due to poor image quality.  She was found to have at least mild to moderate LV dysfunction with an ejection fraction of around 40%.  She has grade 2 diastolic dysfunction.  She has been getting Lasix and is feeling quite a bit better.  Continue current dose of lasix   She has been on lisinopril at home.  Her blood pressure remains mildly elevated.  I think she would be a good candidate for Entresto.  I will discontinue the lisinopril and will start lisinopril after 36-hour washout.  I will give her irbesartan in the meantime.  She had lisinopril this  afternoon.  We will wait and start Entresto on Saturday morning and will discontinue the Irbesartan at that time    2.  Question of wall motion abnormalities: The echo was very difficult.  I personally reviewed the echo images.  Its possible that she has had a myocardial infarction although her troponin levels are completely normal now.  The EKG does not suggest an old myocardial infarction.  I would suggest a repeat limited echo at some point in the near future with Definity contrast to help define her wall motion.  3.  Hypertension: Her blood pressure remains moderately elevated.  She eats a very high salt diet at home.  We reviewed some examples of good  healthy eating.  She is committed to getting healthier.  4.  Covid: Further management per the [facility] service.  5.  Morbid obesity: I encouraged her to work on weight loss program when she is out of the hospital.  Risk Assessment/Risk Scores:        New York Heart Association (NYHA) Functional Class NYHA Class III        For questions or updates, please contact [facility] Please consult [email or web address] for contact info under    Signed, Mertie Moores, MD  12/12/2020 4:37 PM

## 2020-12-12 NOTE — Plan of Care (Signed)
  Problem: Education: Goal: Knowledge of General Education information will improve Description: Including pain rating scale, medication(s)/side effects and non-pharmacologic comfort measures Outcome: Progressing   Problem: Clinical Measurements: Goal: Ability to maintain clinical measurements within normal limits will improve Outcome: Progressing Goal: Respiratory complications will improve Outcome: Progressing   Problem: Activity: Goal: Risk for activity intolerance will decrease Outcome: Progressing   Problem: Nutrition: Goal: Adequate nutrition will be maintained Outcome: Progressing   Problem: Coping: Goal: Level of anxiety will decrease Outcome: Progressing   Problem: Safety: Goal: Ability to remain free from injury will improve Outcome: Progressing   Problem: Skin Integrity: Goal: Risk for impaired skin integrity will decrease Outcome: Progressing  No acute events took place overnight. Will continue to monitor

## 2020-12-12 NOTE — Progress Notes (Addendum)
PROGRESS NOTE                                                                                                                                                                                                             Patient Demographics:    Sherry Hall, is a 40 y.o. female, DOB - 03-17-1981, ELF:[id number]  Outpatient Primary MD for the patient is Massie Maroon, FNP   Admit date - 12/11/2020   LOS - 1  Chief Complaint  Patient presents with  . Shortness of Breath       Brief Narrative: Patient is a 40 y.o. female with PMHx of insulin-dependent DM-2, HTN, HIV (noncompliant to antiretrovirals for almost 8 months)-presented with a 2-week history of orthopnea and dyspnea on exertion   COVID-19 vaccinated status: Unvaccinated  Significant Events: 2/23>> Admit to [facility] for orthopnea/exertional dyspnea  Significant studies: 2/23>>Chest x-ray: Interstitial infiltrates 2/23>> CTA chest: No PE.  Minimal bilateral atelectasis  COVID-19 medications: Remdesivir: 2/23>>  Antibiotics: None  Microbiology data: None  Procedures: None  Consults: None  DVT prophylaxis: enoxaparin (LOVENOX) injection 40 mg Start: 12/11/20 1430    Subjective:    Sherry Hall today feels better after getting IV Lasix.  Claims that she was able to walk to the bathroom without any major   Assessment  & Plan :   Exertional dyspnea/orthopnea: Probably decompensated diastolic heart failure-although no obvious signs of volume overload-however body habitus makes it difficult.  Reasonable to continue with IV Lasix-await echo.  Addendum-echo shows new onset systolic heart failure-have consulted cardiology.  HIV: Noncompliant with ART for almost 8 months-CD4 count stable at 570.  Will reach out to ID to see if ARTs can be restarted prior to discharge.  COVID-19 infection: No major parenchymal disease seen on CT  chest-suspect 3 days of Remdesivir is appropriate in this setting.  No role for steroids.  COVID-19 Labs: Recent Labs    12/11/20 1622 12/12/20 0806  DDIMER 2.49* 2.09*  CRP  --  1.5*   HTN: BP controlled-continue lisinopril-stop HCTZ-May need to be started on a beta-blocker prior to discharge.  Insulin-dependent M-2 (A1c 9.8 on 2/23): CBG stable-continue Lantus 15 units daily and SSI follow and adjust  Recent Labs    12/11/20 1637 12/11/20 2139 12/12/20 0755  GLUCAP 224* 144* 171*   Depression/anxiety: Claims she lost  her 64 year old son in 2017-since that she has had anxiety issues-currently stable-continue trazodone, BuSpar/as needed Xanax-stable for outpatient follow-up with PCP.   Morbid Obesity: Estimated body mass index is 52.73 kg/m as calculated from the following:   Height as of this encounter: 5' (1.524 m).   Weight as of this encounter: 122.5 kg.    ABG: No results found for: PHART, PCO2ART, PO2ART, HCO3, TCO2, ACIDBASEDEF, O2SAT  Vent Settings: N/A    Condition -Stable  Family Communication  : None at bedside-patient awake/alert and able to update family herself.  Code Status :  Full Code  Diet :  Diet Order            Diet heart healthy/carb modified Room service appropriate? Yes; Fluid consistency: Thin  Diet effective now                  Disposition Plan  :   Status is: Inpatient  Remains inpatient appropriate because:Inpatient level of care appropriate due to severity of illness   Dispo: The patient is from: Home              Anticipated d/c is to: Home              Patient currently is not medically stable to d/c.   Difficult to place patient No    Barriers to discharge: Await work-up-on IV Lasix  Antimicorbials  :    Anti-infectives (From admission, onward)   Start     Dose/Rate Route Frequency Ordered Stop   12/12/20 1000  remdesivir 100 mg in sodium chloride 0.9 % 100 mL IVPB       "Followed by" Linked Group Details   100  mg 200 mL/hr over 30 Minutes Intravenous Daily 12/11/20 1833 12/14/20 0959   12/11/20 1930  remdesivir 200 mg in sodium chloride 0.9% 250 mL IVPB       "Followed by" Linked Group Details   200 mg 580 mL/hr over 30 Minutes Intravenous Once 12/11/20 1833 12/11/20 2042   12/11/20 1430  emtricitabine-tenofovir AF (DESCOVY) 200-25 MG per tablet 1 tablet  Status:  Discontinued        1 tablet Oral Daily 12/11/20 1416 12/11/20 1555   12/11/20 1430  dolutegravir (TIVICAY) tablet 50 mg  Status:  Discontinued        50 mg Oral Daily 12/11/20 1416 12/11/20 1555      Inpatient Medications  Scheduled Meds: . busPIRone  10 mg Oral TID  . enoxaparin (LOVENOX) injection  40 mg Subcutaneous Q24H  . furosemide  40 mg Intravenous Daily  . insulin aspart  0-15 Units Subcutaneous TID WC  . insulin glargine  15 Units Subcutaneous QHS  . lisinopril  20 mg Oral Daily   Continuous Infusions: . remdesivir 100 mg in NS 100 mL 100 mg (12/12/20 0905)   PRN Meds:.acetaminophen **OR** acetaminophen, albuterol, ALPRAZolam, benzonatate, ondansetron **OR** ondansetron (ZOFRAN) IV, polyethylene glycol, traZODone   Time Spent in minutes  25  See all Orders from today for further details   Jeoffrey Massed M.D on 12/12/2020 at 11:37 AM  To page go to [email or web address] - use universal password  [facility] -  Office  [phone]    Objective:   Vitals:   12/11/20 1917 12/11/20 2340 12/12/20 0436 12/12/20 0854  BP: 105/85 119/83 128/81   Pulse: (!) 104 100 94   Resp: 20 17 17    Temp: 98.4 F (36.9 C) 98.3 F (36.8 C) 98.2 F (36.8 C)   TempSrc:  Oral Axillary Axillary   SpO2: 100% 97% 96% 100%  Weight:      Height:        Wt Readings from Last 3 Encounters:  12/11/20 122.5 kg  04/02/20 127 kg  10/10/19 124.6 kg    No intake or output data in the 24 hours ending 12/12/20 1137   Physical Exam Gen Exam:Alert awake-not in any distress HEENT:atraumatic, normocephalic Chest: B/L clear to  auscultation anteriorly CVS:S1S2 regular Abdomen:soft non tender, non distended Extremities:no edema Neurology: Non focal Skin: no rash   Data Review:    CBC Recent Labs  Lab 12/11/20 0919 12/12/20 0214  WBC 7.6 7.0  HGB 12.1 10.8*  HCT 35.9* 31.4*  PLT 194 174  MCV 95.2 93.7  MCH 32.1 32.2  MCHC 33.7 34.4  RDW 13.6 13.5    Chemistries  Recent Labs  Lab 12/11/20 0919 12/12/20 0214  NA 132* 134*  K 4.1 3.5  CL 103 102  CO2 19* 23  GLUCOSE 309* 258*  BUN 18 17  CREATININE 0.94 1.14*  CALCIUM 8.7* 8.2*   ------------------------------------------------------------------------------------------------------------------ No results for input(s): CHOL, HDL, LDLCALC, TRIG, CHOLHDL, LDLDIRECT in the last 72 hours.  Lab Results  Component Value Date   HGBA1C 9.8 (H) 12/11/2020   ------------------------------------------------------------------------------------------------------------------ No results for input(s): TSH, T4TOTAL, T3FREE, THYROIDAB in the last 72 hours.  Invalid input(s): FREET3 ------------------------------------------------------------------------------------------------------------------ No results for input(s): VITAMINB12, FOLATE, FERRITIN, TIBC, IRON, RETICCTPCT in the last 72 hours.  Coagulation profile No results for input(s): INR, PROTIME in the last 168 hours.  Recent Labs    12/11/20 1622 12/12/20 0806  DDIMER 2.49* 2.09*    Cardiac Enzymes No results for input(s): CKMB, TROPONINI, MYOGLOBIN in the last 168 hours.  Invalid input(s): CK ------------------------------------------------------------------------------------------------------------------    Component Value Date/Time   BNP 127.6 (H) 12/12/2020 54090806    Micro Results Recent Results (from the past 240 hour(s))  SARS CORONAVIRUS 2 (TAT 6-24 HRS) Nasopharyngeal Nasopharyngeal Swab     Status: Abnormal   Collection Time: 12/11/20 11:18 AM   Specimen: Nasopharyngeal Swab   Result Value Ref Range Status   SARS Coronavirus 2 POSITIVE (A) NEGATIVE Final    Comment: (NOTE) SARS-CoV-2 target nucleic acids are DETECTED.  The SARS-CoV-2 RNA is generally detectable in upper and lower respiratory specimens during the acute phase of infection. Positive results are indicative of the presence of SARS-CoV-2 RNA. Clinical correlation with patient history and other diagnostic information is  necessary to determine patient infection status. Positive results do not rule out bacterial infection or co-infection with other viruses.  The expected result is Negative.  Fact Sheet for Patients: HairSlick.nohttps://www.fda.gov/media/138098/download  Fact Sheet for Healthcare Providers: quierodirigir.comhttps://www.fda.gov/media/138095/download  This test is not yet approved or cleared by the [place] FDA and  has been authorized for detection and/or diagnosis of SARS-CoV-2 by FDA under an Emergency Use Authorization (EUA). This EUA will remain  in effect (meaning this test can be used) for the duration of the COVID-19 declaration under Section 564(b)(1) of the Act, 21 U. S.C. section 360bbb-3(b)(1), unless the authorization is terminated or revoked sooner.   Performed at [facility], [address] [place], [place] [address]     Radiology Reports DG Chest 2 View  Result Date: 12/11/2020 CLINICAL DATA:  40 year old female with shortness of breath for several weeks. EXAM: CHEST - 2 VIEW COMPARISON:  Chest radiographs 01/15/2013. FINDINGS: PA and lateral views. Lung volumes and mediastinal contours have not significantly changed since 2014. Visualized  tracheal air column is within normal limits. No pneumothorax. No layering pleural effusion. However, suggestion of increased pleural fluid in the fissures compared to prior, including the right minor fissure. Superimposed widespread increased bilateral pulmonary interstitial opacity. No consolidation or confluent opacity. No acute osseous  abnormality identified.  Paucity of bowel gas. IMPRESSION: Constellation suspicious for acute pulmonary interstitial edema with trace pleural fluid in the fissures. Viral/atypical respiratory infection felt less likely. Electronically Signed   By: Odessa Fleming M.D.   On: 12/11/2020 09:55   CT ANGIO CHEST PE W OR WO CONTRAST  Result Date: 12/11/2020 CLINICAL DATA:  COVID positive with exertional shortness of breath. EXAM: CT ANGIOGRAPHY CHEST WITH CONTRAST TECHNIQUE: Multidetector CT imaging of the chest was performed using the standard protocol during bolus administration of intravenous contrast. Multiplanar CT image reconstructions and MIPs were obtained to evaluate the vascular anatomy. CONTRAST:  57mL OMNIPAQUE IOHEXOL 350 MG/ML SOLN COMPARISON:  None. FINDINGS: Cardiovascular: Satisfactory opacification of the pulmonary arteries to the segmental level. No evidence of pulmonary embolism. Normal heart size. No pericardial effusion. Mediastinum/Nodes: No enlarged mediastinal, hilar, or axillary lymph nodes. Thyroid gland, trachea, and esophagus demonstrate no significant findings. Lungs/Pleura: Very mild atelectasis and/or early infiltrate is seen within the posterior aspect of the bilateral lung bases. Small bilateral pleural effusions are noted. There is no evidence of a pneumothorax. Upper Abdomen: Multiple surgical clips are seen within the gallbladder fossa. Musculoskeletal: No chest wall abnormality. No acute or significant osseous findings. Review of the MIP images confirms the above findings. IMPRESSION: 1. No CT evidence of pulmonary embolism. 2. Very mild posterior bilateral lower lobe atelectasis and/or early infiltrate. 3. Small bilateral pleural effusions. 4. Evidence of prior cholecystectomy. Electronically Signed   By: Aram Candela M.D.   On: 12/11/2020 19:01

## 2020-12-12 NOTE — Progress Notes (Signed)
VIRTUAL CARE ENCOUNTER  I connected with Sherry Hall on 12/12/2020  at  by TELEPHONE and verified that I am speaking with the correct person using two identifiers.   I discussed the limitations, risks, security and privacy concerns of performing an evaluation and management service by telephone and the availability of in person appointments. I also discussed with the patient that there may be a patient responsible charge related to this service. The patient expressed understanding and agreed to proceed.  Patient Location: [facility]   Provider Location: [facility] Office     Date of Admission:  12/11/2020      Reason for Consult: HIV back to care, COVID  Referring Provider: [place] Primary Care Provider: Massie Maroon, FNP    Assessment: Sherry Hall is a 40 y.o. female with uncontrolled HIV disease off treatment x 18 months with COVID infection. Currently receiving Remdesivir. No supplemental oxygen needed at this time. She feels much better and ready to get back on HIV regimen. I informed her that her recent CD4 count was still intact >500. I will order a viral load now and reassess at outpatient appointment in 3-4 weeks for therapeutic response. We talked about starting Biktarvy once a day. She was happy to accept a simple regimen that was flexible without food.   D/W our ID pharmacy team and will look at securing 30 days of medication prior to discharge so we can get her back to care and financial resources set up through [facility] or other resource.   She was grateful for the call and accepts to start [employer]. Counseled on s/e (insomnia, diarrhea,  Headaches) she may experience with lead in period. Separate multivitamins by 6 hours.   Appt made for 3/17 @ 10:30 am with me.     Plan: 1. Start Biktarvy tonight 2. Draw viral load in AM (ordered) 3. Will get her 30-d Biktarvy for home use 4. COVID treatment per Dr. Jerral Ralph.  5. HIV follow up appointment  has been scheduled.     Principal Problem:   HIV disease (HCC) Active Problems:   Dyspnea   COVID-19 virus infection   . bictegravir-emtricitabine-tenofovir AF  1 tablet Oral Daily  . busPIRone  10 mg Oral TID  . enoxaparin (LOVENOX) injection  40 mg Subcutaneous Q24H  . furosemide  40 mg Intravenous Daily  . insulin aspart  0-15 Units Subcutaneous TID WC  . insulin glargine  15 Units Subcutaneous QHS  . lisinopril  20 mg Oral Daily     HPI: Sherry Hall is a 40 y.o. female admitted for COVID19 infection. She expressed to her hospitalist team that she would like to get back on her HIV medications. Has not been on them for 18 months. Previously on Tivicay and Descovy.     Past Medical History:  Diagnosis Date  . Anemia    H/O  . Anxiety   . Depression   . Diabetes mellitus type 1 (HCC)   . GERD (gastroesophageal reflux disease)   . HIV (human immunodeficiency virus infection) (HCC) 2011  . Hypertension   . Type 2 diabetes mellitus (HCC)     Social History   Tobacco Use  . Smoking status: Current Every Day Smoker    Packs/day: 0.50    Years: 15.00    Pack years: 7.50    Types: Cigarettes  . Smokeless tobacco: Never Used  . Tobacco comment: trying to slow  Vaping Use  . Vaping Use: Never used  Substance  Use Topics  . Alcohol use: Yes    Alcohol/week: 1.0 standard drink    Types: 1 Standard drinks or equivalent per week    Comment: socially  . Drug use: No    Family History  Problem Relation Age of Onset  . Diabetes Father   . Hypertension Father   . Cancer Mother        cervical    Allergies  Allergen Reactions  . Percocet [Oxycodone-Acetaminophen] Hives, Itching and Rash  . Latex Rash    OBJECTIVE: Blood pressure (!) 133/98, pulse 94, temperature 99.2 F (37.3 C), temperature source Oral, resp. rate 14, height 5' (1.524 m), weight 122.5 kg, SpO2 99 %.   Lab Results Lab Results  Component Value Date   WBC 7.0 12/12/2020   HGB 10.8  (L) 12/12/2020   HCT 31.4 (L) 12/12/2020   MCV 93.7 12/12/2020   PLT 174 12/12/2020    Lab Results  Component Value Date   CREATININE 1.14 (H) 12/12/2020   BUN 17 12/12/2020   NA 134 (L) 12/12/2020   K 3.5 12/12/2020   CL 102 12/12/2020   CO2 23 12/12/2020    Lab Results  Component Value Date   ALT 15 04/02/2020   AST 15 04/02/2020   ALKPHOS 89 04/02/2020   BILITOT <0.2 04/02/2020     Microbiology: Recent Results (from the past 240 hour(s))  SARS CORONAVIRUS 2 (TAT 6-24 HRS) Nasopharyngeal Nasopharyngeal Swab     Status: Abnormal   Collection Time: 12/11/20 11:18 AM   Specimen: Nasopharyngeal Swab  Result Value Ref Range Status   SARS Coronavirus 2 POSITIVE (A) NEGATIVE Final    Comment: (NOTE) SARS-CoV-2 target nucleic acids are DETECTED.  The SARS-CoV-2 RNA is generally detectable in upper and lower respiratory specimens during the acute phase of infection. Positive results are indicative of the presence of SARS-CoV-2 RNA. Clinical correlation with patient history and other diagnostic information is  necessary to determine patient infection status. Positive results do not rule out bacterial infection or co-infection with other viruses.  The expected result is Negative.  Fact Sheet for Patients: HairSlick.no  Fact Sheet for Healthcare Providers: quierodirigir.com  This test is not yet approved or cleared by the [place] FDA and  has been authorized for detection and/or diagnosis of SARS-CoV-2 by FDA under an Emergency Use Authorization (EUA). This EUA will remain  in effect (meaning this test can be used) for the duration of the COVID-19 declaration under Section 564(b)(1) of the Act, 21 U. S.C. section 360bbb-3(b)(1), unless the authorization is terminated or revoked sooner.   Performed at [facility], [address], [place], [place] [address]     I provided 12 minutes of non-face-to-face  time during this encounter. No billing occurred    Rexene Alberts, MSN, NP-C [facility] Infectious [facility]  [email or web address] Pager: [phone] Office: [phone] [facility] Main Line: [phone]    12/12/2020 3:37 PM

## 2020-12-13 ENCOUNTER — Other Ambulatory Visit: Payer: Self-pay

## 2020-12-13 ENCOUNTER — Encounter (HOSPITAL_COMMUNITY): Payer: Self-pay | Admitting: Internal Medicine

## 2020-12-13 DIAGNOSIS — B20 Human immunodeficiency virus [HIV] disease: Secondary | ICD-10-CM | POA: Diagnosis not present

## 2020-12-13 DIAGNOSIS — E1169 Type 2 diabetes mellitus with other specified complication: Secondary | ICD-10-CM | POA: Diagnosis not present

## 2020-12-13 DIAGNOSIS — I5031 Acute diastolic (congestive) heart failure: Secondary | ICD-10-CM | POA: Diagnosis not present

## 2020-12-13 DIAGNOSIS — I10 Essential (primary) hypertension: Secondary | ICD-10-CM | POA: Diagnosis not present

## 2020-12-13 LAB — CBC
HCT: 34.1 % — ABNORMAL LOW (ref 36.0–46.0)
Hemoglobin: 11.8 g/dL — ABNORMAL LOW (ref 12.0–15.0)
MCH: 32.2 pg (ref 26.0–34.0)
MCHC: 34.6 g/dL (ref 30.0–36.0)
MCV: 92.9 fL (ref 80.0–100.0)
Platelets: 199 10*3/uL (ref 150–400)
RBC: 3.67 MIL/uL — ABNORMAL LOW (ref 3.87–5.11)
RDW: 13.5 % (ref 11.5–15.5)
WBC: 6.4 10*3/uL (ref 4.0–10.5)
nRBC: 0 % (ref 0.0–0.2)

## 2020-12-13 LAB — COMPREHENSIVE METABOLIC PANEL
ALT: 11 U/L (ref 0–44)
AST: 9 U/L — ABNORMAL LOW (ref 15–41)
Albumin: 2.5 g/dL — ABNORMAL LOW (ref 3.5–5.0)
Alkaline Phosphatase: 50 U/L (ref 38–126)
Anion gap: 9 (ref 5–15)
BUN: 21 mg/dL — ABNORMAL HIGH (ref 6–20)
CO2: 22 mmol/L (ref 22–32)
Calcium: 8.3 mg/dL — ABNORMAL LOW (ref 8.9–10.3)
Chloride: 105 mmol/L (ref 98–111)
Creatinine, Ser: 1.24 mg/dL — ABNORMAL HIGH (ref 0.44–1.00)
GFR, Estimated: 56 mL/min — ABNORMAL LOW (ref >60)
Glucose, Bld: 119 mg/dL — ABNORMAL HIGH (ref 70–99)
Potassium: 3.5 mmol/L (ref 3.5–5.1)
Sodium: 136 mmol/L (ref 135–145)
Total Bilirubin: 0.5 mg/dL (ref 0.3–1.2)
Total Protein: 6.9 g/dL (ref 6.5–8.1)

## 2020-12-13 LAB — D-DIMER, QUANTITATIVE: D-Dimer, Quant: 2.82 ug/mL-FEU — ABNORMAL HIGH (ref 0.00–0.50)

## 2020-12-13 LAB — C-REACTIVE PROTEIN: CRP: 1.8 mg/dL — ABNORMAL HIGH (ref <1.0)

## 2020-12-13 LAB — GLUCOSE, CAPILLARY
Glucose-Capillary: 129 mg/dL — ABNORMAL HIGH (ref 70–99)
Glucose-Capillary: 205 mg/dL — ABNORMAL HIGH (ref 70–99)
Glucose-Capillary: 152 mg/dL — ABNORMAL HIGH (ref 70–99)
Glucose-Capillary: 215 mg/dL — ABNORMAL HIGH (ref 70–99)

## 2020-12-13 MED ORDER — FUROSEMIDE 40 MG PO TABS
40.0000 mg | ORAL_TABLET | Freq: Two times a day (BID) | ORAL | Status: DC
  Administered 2020-12-13 – 2020-12-14 (×2): 40 mg via ORAL
  Filled 2020-12-13 (×2): qty 1

## 2020-12-13 MED ORDER — POTASSIUM CHLORIDE CRYS ER 20 MEQ PO TBCR
20.0000 meq | EXTENDED_RELEASE_TABLET | Freq: Every day | ORAL | Status: DC
  Administered 2020-12-13 – 2020-12-16 (×4): 20 meq via ORAL
  Filled 2020-12-13 (×4): qty 1

## 2020-12-13 NOTE — Progress Notes (Signed)
PROGRESS NOTE                                                                                                                                                                                                             Patient Demographics:    Sherry Hall, is a 40 y.o. female, DOB - [birth year], WUJ:[id number]  Outpatient Primary MD for the patient is Massie Maroon, FNP   Admit date - 12/11/2020   LOS - 2  Chief Complaint  Patient presents with  . Shortness of Breath       Brief Narrative: Patient is a 40 y.o. female with PMHx of insulin-dependent DM-2, HTN, HIV (noncompliant to antiretrovirals for almost 8 months)-presented with a 2-week history of orthopnea and dyspnea on exertion -upon further evaluation-she was found to have acute systolic heart failure.  COVID-19 vaccinated status: Unvaccinated  Significant Events: 2/23>> Admit to [facility] for orthopnea/exertional dyspnea  Significant studies: 2/23>>Chest x-ray: Interstitial infiltrates 2/23>> CTA chest: No PE.  Minimal bilateral atelectasis 2/24>> Echo: EF 35-40%, grade 2 diastolic dysfunction, + wall motion abnormalities.  COVID-19 medications: Remdesivir: 2/23>> 2/25  Antibiotics: None  Microbiology data: None  Procedures: None  Consults: ID, cardiology  DVT prophylaxis: enoxaparin (LOVENOX) injection 40 mg Start: 12/11/20 1430    Subjective:   Feels much better today-less dyspnea with walking to the bathroom.   Assessment  & Plan :   HFrEF with exacerbation: Improved volume status-only minimal exertional dyspnea at this point.  Change to oral Lasix.  Initially on lisinopril-has been switched to ARB-spoke with Dr. Cherlynn June to change to [facility] tomorrow after washout.  Complete.  Cardiology planning on repeating echo with Definity to reassess wall motion abnormality.    HIV: Noncompliant with ART for almost 8 months-CD4 count  stable at 570.  ID consulted-started on ART on 2/24.   COVID-19 infection: Likely incidental finding-no hypoxia-no pneumonia on CT imaging-needs Remdesivir x3 days only.  No role for steroids.    COVID-19 Labs: Recent Labs    12/11/20 1622 12/12/20 0806 12/13/20 0131  DDIMER 2.49* 2.09* 2.82*  CRP  --  1.5* 1.8*   HTN: BP controlled-on ARB with plans to switch to [facility] on Saturday  Insulin-dependent M-2 (A1c 9.8 on 2/23): CBG stable-continue Lantus 15 units daily and SSI follow and adjust  Recent Labs    12/12/20  2041 12/13/20 0757 12/13/20 1207  GLUCAP 164* 129* 205*   Depression/anxiety: Claims she lost her 38 year old son in 2017-since that she has had anxiety issues-currently stable-continue trazodone, BuSpar/as needed Xanax-stable for outpatient follow-up with PCP.   Morbid Obesity: Estimated body mass index is 52.73 kg/m as calculated from the following:   Height as of this encounter: 5' (1.524 m).   Weight as of this encounter: 122.5 kg.    ABG: No results found for: PHART, PCO2ART, PO2ART, HCO3, TCO2, ACIDBASEDEF, O2SAT  Vent Settings: N/A    Condition -Stable  Family Communication  : None at bedside-patient awake/alert and able to update family herself.  Code Status :  Full Code  Diet :  Diet Order            Diet heart healthy/carb modified Room service appropriate? Yes; Fluid consistency: Thin  Diet effective now                  Disposition Plan  :   Status is: Inpatient  Remains inpatient appropriate because:Inpatient level of care appropriate due to severity of illness   Dispo: The patient is from: Home              Anticipated d/c is to: Home              Patient currently is not medically stable to d/c.   Difficult to place patient No    Barriers to discharge: Newly diagnosed systolic heart failure-needs inpatient optimization of heart failure regimen before consideration of discharge.  Antimicorbials  :    Anti-infectives  (From admission, onward)   Start     Dose/Rate Route Frequency Ordered Stop   12/12/20 1600  bictegravir-emtricitabine-tenofovir AF (BIKTARVY) 50-200-25 MG per tablet 1 tablet        1 tablet Oral Daily 12/12/20 1511     12/12/20 1000  remdesivir 100 mg in sodium chloride 0.9 % 100 mL IVPB       "Followed by" Linked Group Details   100 mg 200 mL/hr over 30 Minutes Intravenous Daily 12/11/20 1833 12/13/20 0932   12/12/20 0000  bictegravir-emtricitabine-tenofovir AF (BIKTARVY) 50-200-25 MG TABS tablet        1 tablet Oral Daily 12/12/20 1204     12/11/20 1930  remdesivir 200 mg in sodium chloride 0.9% 250 mL IVPB       "Followed by" Linked Group Details   200 mg 580 mL/hr over 30 Minutes Intravenous Once 12/11/20 1833 12/11/20 2042   12/11/20 1430  emtricitabine-tenofovir AF (DESCOVY) 200-25 MG per tablet 1 tablet  Status:  Discontinued        1 tablet Oral Daily 12/11/20 1416 12/11/20 1555   12/11/20 1430  dolutegravir (TIVICAY) tablet 50 mg  Status:  Discontinued        50 mg Oral Daily 12/11/20 1416 12/11/20 1555      Inpatient Medications  Scheduled Meds: . bictegravir-emtricitabine-tenofovir AF  1 tablet Oral Daily  . busPIRone  10 mg Oral TID  . enoxaparin (LOVENOX) injection  40 mg Subcutaneous Q24H  . insulin aspart  0-15 Units Subcutaneous TID WC  . insulin glargine  15 Units Subcutaneous QHS  . irbesartan  300 mg Oral Daily   Continuous Infusions:  PRN Meds:.acetaminophen **OR** acetaminophen, albuterol, ALPRAZolam, benzonatate, ondansetron **OR** ondansetron (ZOFRAN) IV, polyethylene glycol, traZODone   Time Spent in minutes  25  See all Orders from today for further details   Jeoffrey Massed M.D on 12/13/2020 at 2:42 PM  To page go to [email or web address] - use universal password  [facility] -  Office  [phone]    Objective:   Vitals:   12/12/20 1222 12/12/20 2044 12/13/20 0511 12/13/20 0722  BP: (!) 133/98 (!) 146/87 (!) 125/91   Pulse: 94 100 92    Resp: 14 (!) 22 18   Temp: 99.2 F (37.3 C) 98.7 F (37.1 C) 98.2 F (36.8 C)   TempSrc: Oral Oral Oral   SpO2: 99% 100% 96% 96%  Weight:      Height:        Wt Readings from Last 3 Encounters:  12/11/20 122.5 kg  04/02/20 127 kg  10/10/19 124.6 kg     Intake/Output Summary (Last 24 hours) at 12/13/2020 1442 Last data filed at 12/13/2020 0845 Gross per 24 hour  Intake 690 ml  Output -  Net 690 ml     Physical Exam Gen Exam:Alert awake-not in any distress HEENT:atraumatic, normocephalic Chest: B/L clear to auscultation anteriorly CVS:S1S2 regular Abdomen:soft non tender, non distended Extremities:no edema Neurology: Non focal Skin: no rash   Data Review:    CBC Recent Labs  Lab 12/11/20 0919 12/12/20 0214 12/13/20 0131  WBC 7.6 7.0 6.4  HGB 12.1 10.8* 11.8*  HCT 35.9* 31.4* 34.1*  PLT 194 174 199  MCV 95.2 93.7 92.9  MCH 32.1 32.2 32.2  MCHC 33.7 34.4 34.6  RDW 13.6 13.5 13.5    Chemistries  Recent Labs  Lab 12/11/20 0919 12/12/20 0214 12/13/20 0131  NA 132* 134* 136  K 4.1 3.5 3.5  CL 103 102 105  CO2 19* 23 22  GLUCOSE 309* 258* 119*  BUN 18 17 21*  CREATININE 0.94 1.14* 1.24*  CALCIUM 8.7* 8.2* 8.3*  AST  --   --  9*  ALT  --   --  11  ALKPHOS  --   --  50  BILITOT  --   --  0.5   ------------------------------------------------------------------------------------------------------------------ No results for input(s): CHOL, HDL, LDLCALC, TRIG, CHOLHDL, LDLDIRECT in the last 72 hours.  Lab Results  Component Value Date   HGBA1C 9.8 (H) 12/11/2020   ------------------------------------------------------------------------------------------------------------------ No results for input(s): TSH, T4TOTAL, T3FREE, THYROIDAB in the last 72 hours.  Invalid input(s): FREET3 ------------------------------------------------------------------------------------------------------------------ No results for input(s): VITAMINB12, FOLATE,  FERRITIN, TIBC, IRON, RETICCTPCT in the last 72 hours.  Coagulation profile No results for input(s): INR, PROTIME in the last 168 hours.  Recent Labs    12/12/20 0806 12/13/20 0131  DDIMER 2.09* 2.82*    Cardiac Enzymes No results for input(s): CKMB, TROPONINI, MYOGLOBIN in the last 168 hours.  Invalid input(s): CK ------------------------------------------------------------------------------------------------------------------    Component Value Date/Time   BNP 127.6 (H) 12/12/2020 6222    Micro Results Recent Results (from the past 240 hour(s))  SARS CORONAVIRUS 2 (TAT 6-24 HRS) Nasopharyngeal Nasopharyngeal Swab     Status: Abnormal   Collection Time: 12/11/20 11:18 AM   Specimen: Nasopharyngeal Swab  Result Value Ref Range Status   SARS Coronavirus 2 POSITIVE (A) NEGATIVE Final    Comment: (NOTE) SARS-CoV-2 target nucleic acids are DETECTED.  The SARS-CoV-2 RNA is generally detectable in upper and lower respiratory specimens during the acute phase of infection. Positive results are indicative of the presence of SARS-CoV-2 RNA. Clinical correlation with patient history and other diagnostic information is  necessary to determine patient infection status. Positive results do not rule out bacterial infection or co-infection with other viruses.  The expected result is Negative.  Fact Sheet for  Patients: HairSlick.nohttps://www.fda.gov/media/138098/download  Fact Sheet for Healthcare Providers: quierodirigir.comhttps://www.fda.gov/media/138095/download  This test is not yet approved or cleared by the [place] FDA and  has been authorized for detection and/or diagnosis of SARS-CoV-2 by FDA under an Emergency Use Authorization (EUA). This EUA will remain  in effect (meaning this test can be used) for the duration of the COVID-19 declaration under Section 564(b)(1) of the Act, 21 U. S.C. section 360bbb-3(b)(1), unless the authorization is terminated or revoked sooner.   Performed at [facility], [address] [place], [place] [address]     Radiology Reports DG Chest 2 View  Result Date: 12/11/2020 CLINICAL DATA:  40 year old female with shortness of breath for several weeks. EXAM: CHEST - 2 VIEW COMPARISON:  Chest radiographs 01/15/2013. FINDINGS: PA and lateral views. Lung volumes and mediastinal contours have not significantly changed since 2014. Visualized tracheal air column is within normal limits. No pneumothorax. No layering pleural effusion. However, suggestion of increased pleural fluid in the fissures compared to prior, including the right minor fissure. Superimposed widespread increased bilateral pulmonary interstitial opacity. No consolidation or confluent opacity. No acute osseous abnormality identified.  Paucity of bowel gas. IMPRESSION: Constellation suspicious for acute pulmonary interstitial edema with trace pleural fluid in the fissures. Viral/atypical respiratory infection felt less likely. Electronically Signed   By: Odessa FlemingH  Hall M.D.   On: 12/11/2020 09:55   CT ANGIO CHEST PE W OR WO CONTRAST  Result Date: 12/11/2020 CLINICAL DATA:  COVID positive with exertional shortness of breath. EXAM: CT ANGIOGRAPHY CHEST WITH CONTRAST TECHNIQUE: Multidetector CT imaging of the chest was performed using the standard protocol during bolus administration of intravenous contrast. Multiplanar CT image reconstructions and MIPs were obtained to evaluate the vascular anatomy. CONTRAST:  68mL OMNIPAQUE IOHEXOL 350 MG/ML SOLN COMPARISON:  None. FINDINGS: Cardiovascular: Satisfactory opacification of the pulmonary arteries to the segmental level. No evidence of pulmonary embolism. Normal heart size. No pericardial effusion. Mediastinum/Nodes: No enlarged mediastinal, hilar, or axillary lymph nodes. Thyroid gland, trachea, and esophagus demonstrate no significant findings. Lungs/Pleura: Very mild atelectasis and/or early infiltrate is seen within the posterior aspect of the bilateral  lung bases. Small bilateral pleural effusions are noted. There is no evidence of a pneumothorax. Upper Abdomen: Multiple surgical clips are seen within the gallbladder fossa. Musculoskeletal: No chest wall abnormality. No acute or significant osseous findings. Review of the MIP images confirms the above findings. IMPRESSION: 1. No CT evidence of pulmonary embolism. 2. Very mild posterior bilateral lower lobe atelectasis and/or early infiltrate. 3. Small bilateral pleural effusions. 4. Evidence of prior cholecystectomy. Electronically Signed   By: Aram Candelahaddeus  Houston M.D.   On: 12/11/2020 19:01   ECHOCARDIOGRAM COMPLETE  Result Date: 12/12/2020    ECHOCARDIOGRAM REPORT   Patient Name:   Hughie ClossBRIDGETTE Nasca Date of Exam: 12/12/2020 Medical Rec #:  [id number]           Height:       60.0 in Accession #:    [id number]          Weight:       270.0 lb Date of Birth:  1981/07/25          BSA:          2.121 m Patient Age:    39 years            BP:           128/81 mmHg Patient Gender: F  HR:           95 bpm. Exam Location:  Inpatient Procedure: 2D Echo, Cardiac Doppler and Color Doppler Indications:    R06.02 SOB  History:        Patient has no prior history of Echocardiogram examinations.                 Signs/Symptoms:Shortness of Breath and Dyspnea; Risk                 Factors:Current Smoker, Hypertension and Diabetes. Covid                 positive.  Sonographer:    Sheralyn Boatman RDCS Referring Phys: [phone] ANTHONY ALLEN  Sonographer Comments: Patient is morbidly obese and Technically difficult study due to poor echo windows. Image acquisition challenging due to patient body habitus. IMPRESSIONS  1. Left ventricular ejection fraction, by estimation, is 35 to 40%. The left ventricle has moderately decreased function. The left ventricle demonstrates global hypokinesis. There is mild left ventricular hypertrophy. Left ventricular diastolic parameters are consistent with Grade II diastolic dysfunction  (pseudonormalization).  2. Right ventricular systolic function is normal. The right ventricular size is normal.  3. The mitral valve is normal in structure. Trivial mitral valve regurgitation. No evidence of mitral stenosis.  4. The aortic valve is normal in structure. Aortic valve regurgitation is not visualized. No aortic stenosis is present.  5. The inferior vena cava is normal in size with greater than 50% respiratory variability, suggesting right atrial pressure of 3 mmHg. FINDINGS  Left Ventricle: Left ventricular ejection fraction, by estimation, is 35 to 40%. The left ventricle has moderately decreased function. The left ventricle demonstrates global hypokinesis. The left ventricular internal cavity size was normal in size. There is mild left ventricular hypertrophy. Left ventricular diastolic parameters are consistent with Grade II diastolic dysfunction (pseudonormalization).  LV Wall Scoring: The inferior wall, basal anteroseptal segment, and mid inferoseptal segment are akinetic. Right Ventricle: The right ventricular size is normal. No increase in right ventricular wall thickness. Right ventricular systolic function is normal. Left Atrium: Left atrial size was normal in size. Right Atrium: Right atrial size was normal in size. Pericardium: There is no evidence of pericardial effusion. Mitral Valve: The mitral valve is normal in structure. Trivial mitral valve regurgitation. No evidence of mitral valve stenosis. Tricuspid Valve: The tricuspid valve is normal in structure. Tricuspid valve regurgitation is not demonstrated. No evidence of tricuspid stenosis. Aortic Valve: The aortic valve is normal in structure. Aortic valve regurgitation is not visualized. No aortic stenosis is present. Pulmonic Valve: The pulmonic valve was normal in structure. Pulmonic valve regurgitation is not visualized. No evidence of pulmonic stenosis. Aorta: The aortic root is normal in size and structure. Venous: The inferior vena  cava is normal in size with greater than 50% respiratory variability, suggesting right atrial pressure of 3 mmHg. IAS/Shunts: No atrial level shunt detected by color flow Doppler.  LEFT VENTRICLE PLAX 2D LVIDd:         5.00 cm      Diastology LVIDs:         4.80 cm      LV e' medial:    5.87 cm/s LV PW:         1.00 cm      LV E/e' medial:  20.8 LV IVS:        1.30 cm      LV e' lateral:   5.87 cm/s LVOT diam:  1.80 cm      LV E/e' lateral: 20.8 LV SV:         55 LV SV Index:   26 LVOT Area:     2.54 cm  LV Volumes (MOD) LV vol d, MOD A2C: 98.3 ml LV vol d, MOD A4C: 120.0 ml LV vol s, MOD A2C: 65.4 ml LV vol s, MOD A4C: 72.6 ml LV SV MOD A2C:     32.9 ml LV SV MOD A4C:     120.0 ml LV SV MOD BP:      43.0 ml RIGHT VENTRICLE             IVC RV S prime:     11.40 cm/s  IVC diam: 1.40 cm TAPSE (M-mode): 2.0 cm LEFT ATRIUM             Index       RIGHT ATRIUM           Index LA diam:        3.40 cm 1.60 cm/m  RA Area:     12.10 cm LA Vol (A2C):   38.6 ml 18.20 ml/m RA Volume:   24.20 ml  11.41 ml/m LA Vol (A4C):   39.0 ml 18.39 ml/m LA Biplane Vol: 39.3 ml 18.53 ml/m  AORTIC VALVE LVOT Vmax:   122.00 cm/s LVOT Vmean:  88.200 cm/s LVOT VTI:    0.216 m  AORTA Ao Root diam: 2.60 cm Ao Asc diam:  2.90 cm MITRAL VALVE MV Area (PHT): 5.02 cm     SHUNTS MV Decel Time: 151 msec     Systemic VTI:  0.22 m MV E velocity: 122.00 cm/s  Systemic Diam: 1.80 cm MV A velocity: 80.40 cm/s MV E/A ratio:  1.52 Donato Schultz MD Electronically signed by Donato Schultz MD Signature Date/Time: 12/12/2020/12:49:32 PM    Final

## 2020-12-13 NOTE — Plan of Care (Signed)
  Problem: Health Behavior/Discharge Planning: Goal: Ability to manage health-related needs will improve Outcome: Progressing   Problem: Clinical Measurements: Goal: Ability to maintain clinical measurements within normal limits will improve Outcome: Progressing   Problem: Coping: Goal: Level of anxiety will decrease Outcome: Progressing   Problem: Safety: Goal: Ability to remain free from injury will improve Outcome: Progressing   Problem: Skin Integrity: Goal: Risk for impaired skin integrity will decrease Outcome: Progressing

## 2020-12-14 DIAGNOSIS — B20 Human immunodeficiency virus [HIV] disease: Secondary | ICD-10-CM | POA: Diagnosis not present

## 2020-12-14 DIAGNOSIS — I5020 Unspecified systolic (congestive) heart failure: Secondary | ICD-10-CM

## 2020-12-14 LAB — CBC
HCT: 34.4 % — ABNORMAL LOW (ref 36.0–46.0)
Hemoglobin: 11.8 g/dL — ABNORMAL LOW (ref 12.0–15.0)
MCH: 31.7 pg (ref 26.0–34.0)
MCHC: 34.3 g/dL (ref 30.0–36.0)
MCV: 92.5 fL (ref 80.0–100.0)
Platelets: 195 10*3/uL (ref 150–400)
RBC: 3.72 MIL/uL — ABNORMAL LOW (ref 3.87–5.11)
RDW: 13.4 % (ref 11.5–15.5)
WBC: 6 10*3/uL (ref 4.0–10.5)
nRBC: 0 % (ref 0.0–0.2)

## 2020-12-14 LAB — HIV-1 RNA QUANT-NO REFLEX-BLD
HIV 1 RNA Quant: 3510 copies/mL
LOG10 HIV-1 RNA: 3.545 log10copy/mL

## 2020-12-14 LAB — COMPREHENSIVE METABOLIC PANEL
ALT: 12 U/L (ref 0–44)
AST: 12 U/L — ABNORMAL LOW (ref 15–41)
Albumin: 2.5 g/dL — ABNORMAL LOW (ref 3.5–5.0)
Alkaline Phosphatase: 52 U/L (ref 38–126)
Anion gap: 8 (ref 5–15)
BUN: 23 mg/dL — ABNORMAL HIGH (ref 6–20)
CO2: 22 mmol/L (ref 22–32)
Calcium: 8.3 mg/dL — ABNORMAL LOW (ref 8.9–10.3)
Chloride: 105 mmol/L (ref 98–111)
Creatinine, Ser: 1.25 mg/dL — ABNORMAL HIGH (ref 0.44–1.00)
GFR, Estimated: 56 mL/min — ABNORMAL LOW (ref >60)
Glucose, Bld: 175 mg/dL — ABNORMAL HIGH (ref 70–99)
Potassium: 3.7 mmol/L (ref 3.5–5.1)
Sodium: 135 mmol/L (ref 135–145)
Total Bilirubin: 0.6 mg/dL (ref 0.3–1.2)
Total Protein: 6.8 g/dL (ref 6.5–8.1)

## 2020-12-14 LAB — BRAIN NATRIURETIC PEPTIDE: B Natriuretic Peptide: 73.1 pg/mL (ref 0.0–100.0)

## 2020-12-14 LAB — GLUCOSE, CAPILLARY
Glucose-Capillary: 143 mg/dL — ABNORMAL HIGH (ref 70–99)
Glucose-Capillary: 181 mg/dL — ABNORMAL HIGH (ref 70–99)
Glucose-Capillary: 235 mg/dL — ABNORMAL HIGH (ref 70–99)
Glucose-Capillary: 113 mg/dL — ABNORMAL HIGH (ref 70–99)

## 2020-12-14 LAB — D-DIMER, QUANTITATIVE: D-Dimer, Quant: 2.57 ug/mL-FEU — ABNORMAL HIGH (ref 0.00–0.50)

## 2020-12-14 LAB — C-REACTIVE PROTEIN: CRP: 2.2 mg/dL — ABNORMAL HIGH (ref <1.0)

## 2020-12-14 MED ORDER — IRBESARTAN 75 MG PO TABS
75.0000 mg | ORAL_TABLET | Freq: Every day | ORAL | Status: DC
  Administered 2020-12-14: 75 mg via ORAL
  Filled 2020-12-14: qty 1

## 2020-12-14 MED ORDER — CARVEDILOL 3.125 MG PO TABS
3.1250 mg | ORAL_TABLET | Freq: Two times a day (BID) | ORAL | Status: DC
  Administered 2020-12-14 – 2020-12-15 (×2): 3.125 mg via ORAL
  Filled 2020-12-14 (×2): qty 1

## 2020-12-14 MED ORDER — SACUBITRIL-VALSARTAN 24-26 MG PO TABS
1.0000 | ORAL_TABLET | Freq: Two times a day (BID) | ORAL | Status: DC
  Administered 2020-12-15 – 2020-12-16 (×2): 1 via ORAL
  Filled 2020-12-14 (×2): qty 1

## 2020-12-14 MED ORDER — ENOXAPARIN SODIUM 60 MG/0.6ML ~~LOC~~ SOLN
60.0000 mg | Freq: Two times a day (BID) | SUBCUTANEOUS | Status: DC
  Administered 2020-12-14 – 2020-12-17 (×8): 60 mg via SUBCUTANEOUS
  Filled 2020-12-14 (×8): qty 0.6

## 2020-12-14 MED ORDER — FUROSEMIDE 40 MG PO TABS
40.0000 mg | ORAL_TABLET | Freq: Every day | ORAL | Status: DC
  Administered 2020-12-15 – 2020-12-16 (×2): 40 mg via ORAL
  Filled 2020-12-14 (×2): qty 1

## 2020-12-14 NOTE — Progress Notes (Signed)
Cardiology Progress Note  Patient ID: Sherry Hall MRN: [id number] DOB: August 28, 1981 Date of Encounter: 12/14/2020  Primary Cardiologist: No primary care provider on file.  Subjective   Chief Complaint: Shortness of breath.  HPI: Admitted with heart failure.  Not on heart failure medications.  Starting today.  Euvolemic on examination.  ROS:  All other ROS reviewed and negative. Pertinent positives noted in the HPI.     Inpatient Medications  Scheduled Meds: . bictegravir-emtricitabine-tenofovir AF  1 tablet Oral Daily  . busPIRone  10 mg Oral TID  . carvedilol  3.125 mg Oral BID WC  . enoxaparin (LOVENOX) injection  60 mg Subcutaneous Q12H  . [START ON 12/15/2020] furosemide  40 mg Oral Daily  . insulin aspart  0-15 Units Subcutaneous TID WC  . insulin glargine  15 Units Subcutaneous QHS  . potassium chloride  20 mEq Oral Daily  . [START ON 12/15/2020] sacubitril-valsartan  1 tablet Oral BID   Continuous Infusions:  PRN Meds: acetaminophen **OR** [DISCONTINUED] acetaminophen, albuterol, ALPRAZolam, benzonatate, [DISCONTINUED] ondansetron **OR** ondansetron (ZOFRAN) IV, polyethylene glycol, traZODone   Vital Signs   Vitals:   12/13/20 2000 12/13/20 2023 12/14/20 0354 12/14/20 0430  BP:  123/80  128/89  Pulse: (!) 104 99 91 91  Resp: 20 20  19   Temp:  98 F (36.7 C)  98.2 F (36.8 C)  TempSrc:  Oral  Oral  SpO2: 99% 100% 99% 100%  Weight:      Height:        Intake/Output Summary (Last 24 hours) at 12/14/2020 1203 Last data filed at 12/14/2020 0912 Gross per 24 hour  Intake 240 ml  Output --  Net 240 ml   Last 3 Weights 12/11/2020 04/02/2020 10/10/2019  Weight (lbs) 270 lb 280 lb 274 lb 9.6 oz  Weight (kg) 122.471 kg 127.007 kg 124.558 kg      Telemetry  Overnight telemetry shows sinus rhythm heart rate in the 90s, which I personally reviewed.   ECG  The most recent ECG shows sinus tachycardia heart rate 113, no acute ischemic changes or evidence of  infarction, which I personally reviewed.   Physical Exam   Vitals:   12/13/20 2000 12/13/20 2023 12/14/20 0354 12/14/20 0430  BP:  123/80  128/89  Pulse: (!) 104 99 91 91  Resp: 20 20  19   Temp:  98 F (36.7 C)  98.2 F (36.8 C)  TempSrc:  Oral  Oral  SpO2: 99% 100% 99% 100%  Weight:      Height:         Intake/Output Summary (Last 24 hours) at 12/14/2020 1203 Last data filed at 12/14/2020 0912 Gross per 24 hour  Intake 240 ml  Output --  Net 240 ml    Last 3 Weights 12/11/2020 04/02/2020 10/10/2019  Weight (lbs) 270 lb 280 lb 274 lb 9.6 oz  Weight (kg) 122.471 kg 127.007 kg 124.558 kg    Body mass index is 52.73 kg/m.  General: Well nourished, well developed, in no acute distress Head: Atraumatic, normal size  Eyes: PEERLA, EOMI  Neck: Supple, no JVD Endocrine: No thryomegaly Cardiac: Normal S1, S2; RRR; no murmurs, rubs, or gallops Lungs: Clear to auscultation bilaterally, no wheezing, rhonchi or rales  Abd: Soft, nontender, no hepatomegaly  Ext: No edema, pulses 2+ Musculoskeletal: No deformities, BUE and BLE strength normal and equal Skin: Warm and dry, no rashes   Neuro: Alert and oriented to person, place, time, and situation, CNII-XII grossly intact, no focal deficits  Psych: Normal mood and affect   Labs  High Sensitivity Troponin:   Recent Labs  Lab 12/11/20 1206 12/11/20 1600  TROPONINIHS 12 12     Cardiac EnzymesNo results for input(s): TROPONINI in the last 168 hours. No results for input(s): TROPIPOC in the last 168 hours.  Chemistry Recent Labs  Lab 12/12/20 0214 12/13/20 0131 12/14/20 0102  NA 134* 136 135  K 3.5 3.5 3.7  CL 102 105 105  CO2 23 22 22   GLUCOSE 258* 119* 175*  BUN 17 21* 23*  CREATININE 1.14* 1.24* 1.25*  CALCIUM 8.2* 8.3* 8.3*  PROT  --  6.9 6.8  ALBUMIN  --  2.5* 2.5*  AST  --  9* 12*  ALT  --  11 12  ALKPHOS  --  50 52  BILITOT  --  0.5 0.6  GFRNONAA >60 56* 56*  ANIONGAP 9 9 8     Hematology Recent Labs  Lab  12/12/20 0214 12/13/20 0131 12/14/20 0102  WBC 7.0 6.4 6.0  RBC 3.35* 3.67* 3.72*  HGB 10.8* 11.8* 11.8*  HCT 31.4* 34.1* 34.4*  MCV 93.7 92.9 92.5  MCH 32.2 32.2 31.7  MCHC 34.4 34.6 34.3  RDW 13.5 13.5 13.4  PLT 174 199 195   BNP Recent Labs  Lab 12/11/20 1206 12/12/20 0806  BNP 171.0* 127.6*    DDimer  Recent Labs  Lab 12/12/20 0806 12/13/20 0131 12/14/20 0102  DDIMER 2.09* 2.82* 2.57*     Radiology  No results found.  Cardiac Studies  TTE 12/12/2020 1. Left ventricular ejection fraction, by estimation, is 35 to 40%. The  left ventricle has moderately decreased function. The left ventricle  demonstrates global hypokinesis. There is mild left ventricular  hypertrophy. Left ventricular diastolic  parameters are consistent with Grade II diastolic dysfunction  (pseudonormalization).  2. Right ventricular systolic function is normal. The right ventricular  size is normal.  3. The mitral valve is normal in structure. Trivial mitral valve  regurgitation. No evidence of mitral stenosis.  4. The aortic valve is normal in structure. Aortic valve regurgitation is  not visualized. No aortic stenosis is present.  5. The inferior vena cava is normal in size with greater than 50%  respiratory variability, suggesting right atrial pressure of 3 mmHg.   Patient Profile  Sherry Hall is a 40 y.o. female with diabetes, morbid obesity, HIV who was admitted on 12/12/2020 for new onset systolic heart failure.  Assessment & Plan   1.  New onset systolic heart failure, EF 35-40% -Euvolemic on examination.  Continue Lasix 40 mg daily -Kidney function stable.  Start Entresto 24-26 mg twice daily. -Start on Coreg 3.125 mg twice daily. -We will add Aldactone tomorrow. -We will likely add an SGLT2 inhibitor tomorrow. -Admitted with elevated BNP.  Troponins are negative and flat.  EKG is nonischemic.  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   -Echo with mention of  wall motion abnormality.  Not that obvious on my review.  Will repeat a limited echo with contrast. -I favor treating her medically and then pursuing ischemic evaluation as an outpatient.  Her Covid positivity will be problematic. -She is diabetic with A1c 9.8.  Her CT PE study on admission shows no evidence of coronary calcium.  I think this is reassuring.  She likely can just pursue an outpatient cardiac CTA.  Blood pressure also elevated on arrival.  This could just be nonischemic due to HIV and hypertension.  For questions or updates, please contact  [facility] Please consult [email or web address] for contact info under   Time Spent with Patient: I have spent a total of 25 minutes with patient reviewing hospital notes, telemetry, EKGs, labs and examining the patient as well as establishing an assessment and plan that was discussed with the patient.  > 50% of time was spent in direct patient care.    Signed, Lenna Gilford. Flora Lipps, MD [facility]  [facility]  12/14/2020 12:03 PM

## 2020-12-14 NOTE — Progress Notes (Signed)
PROGRESS NOTE                                                                                                                                                                                                             Patient Demographics:    Sherry Hall, is a 40 y.o. female, DOB - 1981/03/15, XLK:[id number]  Outpatient Primary MD for the patient is Massie Maroon, FNP   Admit date - 12/11/2020   LOS - 3  Chief Complaint  Patient presents with  . Shortness of Breath       Brief Narrative: Patient is a 40 y.o. female with PMHx of insulin-dependent DM-2, HTN, HIV (noncompliant to antiretrovirals for almost 8 months)-presented with a 2-week history of orthopnea and dyspnea on exertion -upon further evaluation-she was found to have acute systolic heart failure.  COVID-19 vaccinated status: Unvaccinated  Significant Events: 2/23>> Admit to [facility] for orthopnea/exertional dyspnea  Significant studies: 2/23>>Chest x-ray: Interstitial infiltrates 2/23>> CTA chest: No PE.  Minimal bilateral atelectasis 2/24>> Echo: EF 35-40%, grade 2 diastolic dysfunction, + wall motion abnormalities.  COVID-19 medications: Remdesivir: 2/23>> 2/25  Antibiotics: None  Microbiology data: None  Procedures: None  Consults: ID, cardiology  DVT prophylaxis: enoxaparin (LOVENOX) injection 40 mg Start: 12/11/20 1430    Subjective:   Patient in bed, appears comfortable, denies any headache, no fever, no chest pain or pressure, no shortness of breath , no abdominal pain. No focal weakness.    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    HIV: Noncompliant with ART for almost 8 months-CD4 count stable at 570.  ID consulted-started on ART on 2/24.   COVID-19 infection: Likely incidental finding-no hypoxia-no pneumonia on CT imaging-needs Remdesivir x3 days only.  No role for steroids.  D dimer is borderline elevated and she is morbidly obese have adjusted her to moderate dose Lovenox on 12/14/2020.  COVID-19 Labs: Recent Labs    12/12/20 0806 12/13/20 0131 12/14/20 0102  DDIMER 2.09* 2.82* 2.57*  CRP 1.5* 1.8* 2.2*   HTN: BP  controlled-on ARB dose dropped on 12/14/2020 due to mild AKI.  AKI.  Reduced diuretic and ARB dose.  Monitor.    Depression/anxiety: Claims she lost her 30 year old son in 2017-since that she has had anxiety issues-currently stable-continue trazodone, BuSpar/as needed Xanax-stable for outpatient follow-up with PCP.   Morbid Obesity: BMI 52 follow with PCP for weight loss.  Insulin-dependent M-2 (A1c 9.8 on 2/23): CBG stable-continue Lantus 15 units daily and SSI follow and adjust  Recent Labs    12/13/20 1649 12/13/20 2024 12/14/20 0731  GLUCAP 152* 215* 143*    Condition -Stable  Family Communication  : None at bedside-patient awake/alert and able to update family herself.  Code Status :  Full Code  Diet :  Diet Order            Diet heart healthy/carb modified Room service appropriate? Yes; Fluid consistency: Thin; Fluid restriction: 1500 mL Fluid  Diet effective now                  Disposition Plan  :   Status is: Inpatient  Remains inpatient appropriate because:Inpatient level of care appropriate due to severity of illness   Dispo: The patient is from: Home              Anticipated d/c is to: Home              Patient currently is not medically stable to d/c.   Difficult to place patient No    Barriers to discharge: Newly diagnosed systolic heart failure-needs inpatient optimization of heart failure regimen before consideration of  discharge.  Antimicorbials  :    Anti-infectives (From admission, onward)   Start     Dose/Rate Route Frequency Ordered Stop   12/12/20 1600  bictegravir-emtricitabine-tenofovir AF (BIKTARVY) 50-200-25 MG per tablet 1 tablet        1 tablet Oral Daily 12/12/20 1511     12/12/20 1000  remdesivir 100 mg in sodium chloride 0.9 % 100 mL IVPB       "Followed by" Linked Group Details   100 mg 200 mL/hr over 30 Minutes Intravenous Daily 12/11/20 1833 12/13/20 1005   12/12/20 0000  bictegravir-emtricitabine-tenofovir AF (BIKTARVY) 50-200-25 MG TABS tablet        1 tablet Oral Daily 12/12/20 1204     12/11/20 1930  remdesivir 200 mg in sodium chloride 0.9% 250 mL IVPB       "Followed by" Linked Group Details   200 mg 580 mL/hr over 30 Minutes Intravenous Once 12/11/20 1833 12/11/20 2042   12/11/20 1430  emtricitabine-tenofovir AF (DESCOVY) 200-25 MG per tablet 1 tablet  Status:  Discontinued        1 tablet Oral Daily 12/11/20 1416 12/11/20 1555   12/11/20 1430  dolutegravir (TIVICAY) tablet 50 mg  Status:  Discontinued        50 mg Oral Daily 12/11/20 1416 12/11/20 1555      Inpatient Medications  Scheduled Meds: . bictegravir-emtricitabine-tenofovir AF  1 tablet Oral Daily  . busPIRone  10 mg Oral TID  . enoxaparin (LOVENOX) injection  40 mg Subcutaneous Q24H  . [START ON 12/15/2020] furosemide  40 mg Oral Daily  . insulin aspart  0-15 Units Subcutaneous TID WC  . insulin glargine  15 Units Subcutaneous QHS  . irbesartan  75 mg Oral Daily  . potassium chloride  20 mEq Oral Daily   Continuous Infusions:  PRN Meds:.acetaminophen **OR** [DISCONTINUED] acetaminophen, albuterol, ALPRAZolam, benzonatate, [DISCONTINUED] ondansetron **OR** ondansetron (ZOFRAN)  IV, polyethylene glycol, traZODone   Time Spent in minutes  25  See all Orders from today for further details   Susa Raring M.D on 12/14/2020 at 10:32 AM  To page go to [email or web address] - use universal password  [facility] -  Office  [phone]    Objective:   Vitals:   12/13/20 2000 12/13/20 2023 12/14/20 0354 12/14/20 0430  BP:  123/80  128/89  Pulse: (!) 104 99 91 91  Resp: 20 20  19   Temp:  98 F (36.7 C)  98.2 F (36.8 C)  TempSrc:  Oral  Oral  SpO2: 99% 100% 99% 100%  Weight:      Height:        Wt Readings from Last 3 Encounters:  12/11/20 122.5 kg  04/02/20 127 kg  10/10/19 124.6 kg     Intake/Output Summary (Last 24 hours) at 12/14/2020 1032 Last data filed at 12/14/2020 0912 Gross per 24 hour  Intake 240 ml  Output --  Net 240 ml     Physical Exam  Awake Alert, No new F.N deficits, Normal affect [place].AT,PERRAL Supple Neck,No JVD, No cervical lymphadenopathy appriciated.  Symmetrical Chest wall movement, Good air movement bilaterally, CTAB RRR,No Gallops, Rubs or new Murmurs, No Parasternal Heave +ve B.Sounds, Abd Soft, No tenderness, No organomegaly appriciated, No rebound - guarding or rigidity. No Cyanosis, Clubbing or edema, No new Rash or bruise    Data Review:    CBC Recent Labs  Lab 12/11/20 0919 12/12/20 0214 12/13/20 0131 12/14/20 0102  WBC 7.6 7.0 6.4 6.0  HGB 12.1 10.8* 11.8* 11.8*  HCT 35.9* 31.4* 34.1* 34.4*  PLT 194 174 199 195  MCV 95.2 93.7 92.9 92.5  MCH 32.1 32.2 32.2 31.7  MCHC 33.7 34.4 34.6 34.3  RDW 13.6 13.5 13.5 13.4    Chemistries  Recent Labs  Lab 12/11/20 0919 12/12/20 0214 12/13/20 0131 12/14/20 0102  NA 132* 134* 136 135  K 4.1 3.5 3.5 3.7  CL 103 102 105 105  CO2 19* 23 22 22   GLUCOSE 309* 258* 119* 175*  BUN 18 17 21* 23*  CREATININE 0.94 1.14* 1.24* 1.25*  CALCIUM 8.7* 8.2* 8.3* 8.3*  AST  --   --  9* 12*  ALT  --   --  11 12  ALKPHOS  --   --  50 52  BILITOT  --   --  0.5 0.6   ------------------------------------------------------------------------------------------------------------------ No results for input(s): CHOL, HDL, LDLCALC, TRIG, CHOLHDL, LDLDIRECT in the last 72 hours.  Lab Results   Component Value Date   HGBA1C 9.8 (H) 12/11/2020   ------------------------------------------------------------------------------------------------------------------ No results for input(s): TSH, T4TOTAL, T3FREE, THYROIDAB in the last 72 hours.  Invalid input(s): FREET3 ------------------------------------------------------------------------------------------------------------------ No results for input(s): VITAMINB12, FOLATE, FERRITIN, TIBC, IRON, RETICCTPCT in the last 72 hours.  Coagulation profile No results for input(s): INR, PROTIME in the last 168 hours.  Recent Labs    12/13/20 0131 12/14/20 0102  DDIMER 2.82* 2.57*    Cardiac Enzymes No results for input(s): CKMB, TROPONINI, MYOGLOBIN in the last 168 hours.  Invalid input(s): CK ------------------------------------------------------------------------------------------------------------------    Component Value Date/Time   BNP 127.6 (H) 12/12/2020 12/16/20    Micro Results Recent Results (from the past 240 hour(s))  SARS CORONAVIRUS 2 (TAT 6-24 HRS) Nasopharyngeal Nasopharyngeal Swab     Status: Abnormal   Collection Time: 12/11/20 11:18 AM   Specimen: Nasopharyngeal Swab  Result Value Ref Range Status   SARS Coronavirus 2 POSITIVE (  A) NEGATIVE Final    Comment: (NOTE) SARS-CoV-2 target nucleic acids are DETECTED.  The SARS-CoV-2 RNA is generally detectable in upper and lower respiratory specimens during the acute phase of infection. Positive results are indicative of the presence of SARS-CoV-2 RNA. Clinical correlation with patient history and other diagnostic information is  necessary to determine patient infection status. Positive results do not rule out bacterial infection or co-infection with other viruses.  The expected result is Negative.  Fact Sheet for Patients: HairSlick.nohttps://www.fda.gov/media/138098/download  Fact Sheet for Healthcare Providers: quierodirigir.comhttps://www.fda.gov/media/138095/download  This test is  not yet approved or cleared by the [place] FDA and  has been authorized for detection and/or diagnosis of SARS-CoV-2 by FDA under an Emergency Use Authorization (EUA). This EUA will remain  in effect (meaning this test can be used) for the duration of the COVID-19 declaration under Section 564(b)(1) of the Act, 21 U. S.C. section 360bbb-3(b)(1), unless the authorization is terminated or revoked sooner.   Performed at [facility], [address] [place], [place] [address]     Radiology Reports DG Chest 2 View  Result Date: 12/11/2020 CLINICAL DATA:  40 year old female with shortness of breath for several weeks. EXAM: CHEST - 2 VIEW COMPARISON:  Chest radiographs 01/15/2013. FINDINGS: PA and lateral views. Lung volumes and mediastinal contours have not significantly changed since 2014. Visualized tracheal air column is within normal limits. No pneumothorax. No layering pleural effusion. However, suggestion of increased pleural fluid in the fissures compared to prior, including the right minor fissure. Superimposed widespread increased bilateral pulmonary interstitial opacity. No consolidation or confluent opacity. No acute osseous abnormality identified.  Paucity of bowel gas. IMPRESSION: Constellation suspicious for acute pulmonary interstitial edema with trace pleural fluid in the fissures. Viral/atypical respiratory infection felt less likely. Electronically Signed   By: Odessa FlemingH  Hall M.D.   On: 12/11/2020 09:55   CT ANGIO CHEST PE W OR WO CONTRAST  Result Date: 12/11/2020 CLINICAL DATA:  COVID positive with exertional shortness of breath. EXAM: CT ANGIOGRAPHY CHEST WITH CONTRAST TECHNIQUE: Multidetector CT imaging of the chest was performed using the standard protocol during bolus administration of intravenous contrast. Multiplanar CT image reconstructions and MIPs were obtained to evaluate the vascular anatomy. CONTRAST:  68mL OMNIPAQUE IOHEXOL 350 MG/ML SOLN COMPARISON:  None.  FINDINGS: Cardiovascular: Satisfactory opacification of the pulmonary arteries to the segmental level. No evidence of pulmonary embolism. Normal heart size. No pericardial effusion. Mediastinum/Nodes: No enlarged mediastinal, hilar, or axillary lymph nodes. Thyroid gland, trachea, and esophagus demonstrate no significant findings. Lungs/Pleura: Very mild atelectasis and/or early infiltrate is seen within the posterior aspect of the bilateral lung bases. Small bilateral pleural effusions are noted. There is no evidence of a pneumothorax. Upper Abdomen: Multiple surgical clips are seen within the gallbladder fossa. Musculoskeletal: No chest wall abnormality. No acute or significant osseous findings. Review of the MIP images confirms the above findings. IMPRESSION: 1. No CT evidence of pulmonary embolism. 2. Very mild posterior bilateral lower lobe atelectasis and/or early infiltrate. 3. Small bilateral pleural effusions. 4. Evidence of prior cholecystectomy. Electronically Signed   By: Aram Candelahaddeus  Houston M.D.   On: 12/11/2020 19:01   ECHOCARDIOGRAM COMPLETE  Result Date: 12/12/2020    ECHOCARDIOGRAM REPORT   Patient Name:   Hughie ClossBRIDGETTE Ridinger Date of Exam: 12/12/2020 Medical Rec #:  [id number]           Height:       60.0 in Accession #:    [id number]  Weight:       270.0 lb Date of Birth:  03/05/1981          BSA:          2.121 m Patient Age:    39 years            BP:           128/81 mmHg Patient Gender: F                   HR:           95 bpm. Exam Location:  Inpatient Procedure: 2D Echo, Cardiac Doppler and Color Doppler Indications:    R06.02 SOB  History:        Patient has no prior history of Echocardiogram examinations.                 Signs/Symptoms:Shortness of Breath and Dyspnea; Risk                 Factors:Current Smoker, Hypertension and Diabetes. Covid                 positive.  Sonographer:    Sheralyn Boatman RDCS Referring Phys: [phone] ANTHONY ALLEN  Sonographer Comments: Patient is morbidly  obese and Technically difficult study due to poor echo windows. Image acquisition challenging due to patient body habitus. IMPRESSIONS  1. Left ventricular ejection fraction, by estimation, is 35 to 40%. The left ventricle has moderately decreased function. The left ventricle demonstrates global hypokinesis. There is mild left ventricular hypertrophy. Left ventricular diastolic parameters are consistent with Grade II diastolic dysfunction (pseudonormalization).  2. Right ventricular systolic function is normal. The right ventricular size is normal.  3. The mitral valve is normal in structure. Trivial mitral valve regurgitation. No evidence of mitral stenosis.  4. The aortic valve is normal in structure. Aortic valve regurgitation is not visualized. No aortic stenosis is present.  5. The inferior vena cava is normal in size with greater than 50% respiratory variability, suggesting right atrial pressure of 3 mmHg. FINDINGS  Left Ventricle: Left ventricular ejection fraction, by estimation, is 35 to 40%. The left ventricle has moderately decreased function. The left ventricle demonstrates global hypokinesis. The left ventricular internal cavity size was normal in size. There is mild left ventricular hypertrophy. Left ventricular diastolic parameters are consistent with Grade II diastolic dysfunction (pseudonormalization).  LV Wall Scoring: The inferior wall, basal anteroseptal segment, and mid inferoseptal segment are akinetic. Right Ventricle: The right ventricular size is normal. No increase in right ventricular wall thickness. Right ventricular systolic function is normal. Left Atrium: Left atrial size was normal in size. Right Atrium: Right atrial size was normal in size. Pericardium: There is no evidence of pericardial effusion. Mitral Valve: The mitral valve is normal in structure. Trivial mitral valve regurgitation. No evidence of mitral valve stenosis. Tricuspid Valve: The tricuspid valve is normal in  structure. Tricuspid valve regurgitation is not demonstrated. No evidence of tricuspid stenosis. Aortic Valve: The aortic valve is normal in structure. Aortic valve regurgitation is not visualized. No aortic stenosis is present. Pulmonic Valve: The pulmonic valve was normal in structure. Pulmonic valve regurgitation is not visualized. No evidence of pulmonic stenosis. Aorta: The aortic root is normal in size and structure. Venous: The inferior vena cava is normal in size with greater than 50% respiratory variability, suggesting right atrial pressure of 3 mmHg. IAS/Shunts: No atrial level shunt detected by color flow Doppler.  LEFT VENTRICLE PLAX 2D LVIDd:  5.00 cm      Diastology LVIDs:         4.80 cm      LV e' medial:    5.87 cm/s LV PW:         1.00 cm      LV E/e' medial:  20.8 LV IVS:        1.30 cm      LV e' lateral:   5.87 cm/s LVOT diam:     1.80 cm      LV E/e' lateral: 20.8 LV SV:         55 LV SV Index:   26 LVOT Area:     2.54 cm  LV Volumes (MOD) LV vol d, MOD A2C: 98.3 ml LV vol d, MOD A4C: 120.0 ml LV vol s, MOD A2C: 65.4 ml LV vol s, MOD A4C: 72.6 ml LV SV MOD A2C:     32.9 ml LV SV MOD A4C:     120.0 ml LV SV MOD BP:      43.0 ml RIGHT VENTRICLE             IVC RV S prime:     11.40 cm/s  IVC diam: 1.40 cm TAPSE (M-mode): 2.0 cm LEFT ATRIUM             Index       RIGHT ATRIUM           Index LA diam:        3.40 cm 1.60 cm/m  RA Area:     12.10 cm LA Vol (A2C):   38.6 ml 18.20 ml/m RA Volume:   24.20 ml  11.41 ml/m LA Vol (A4C):   39.0 ml 18.39 ml/m LA Biplane Vol: 39.3 ml 18.53 ml/m  AORTIC VALVE LVOT Vmax:   122.00 cm/s LVOT Vmean:  88.200 cm/s LVOT VTI:    0.216 m  AORTA Ao Root diam: 2.60 cm Ao Asc diam:  2.90 cm MITRAL VALVE MV Area (PHT): 5.02 cm     SHUNTS MV Decel Time: 151 msec     Systemic VTI:  0.22 m MV E velocity: 122.00 cm/s  Systemic Diam: 1.80 cm MV A velocity: 80.40 cm/s MV E/A ratio:  1.52 Donato Schultz MD Electronically signed by Donato Schultz MD Signature Date/Time:  12/12/2020/12:49:32 PM    Final

## 2020-12-15 ENCOUNTER — Inpatient Hospital Stay (HOSPITAL_COMMUNITY): Payer: Medicare Other

## 2020-12-15 DIAGNOSIS — I5021 Acute systolic (congestive) heart failure: Secondary | ICD-10-CM

## 2020-12-15 DIAGNOSIS — B20 Human immunodeficiency virus [HIV] disease: Secondary | ICD-10-CM | POA: Diagnosis not present

## 2020-12-15 LAB — COMPREHENSIVE METABOLIC PANEL
ALT: 14 U/L (ref 0–44)
AST: 15 U/L (ref 15–41)
Albumin: 2.4 g/dL — ABNORMAL LOW (ref 3.5–5.0)
Alkaline Phosphatase: 51 U/L (ref 38–126)
Anion gap: 8 (ref 5–15)
BUN: 25 mg/dL — ABNORMAL HIGH (ref 6–20)
CO2: 19 mmol/L — ABNORMAL LOW (ref 22–32)
Calcium: 8 mg/dL — ABNORMAL LOW (ref 8.9–10.3)
Chloride: 106 mmol/L (ref 98–111)
Creatinine, Ser: 1.2 mg/dL — ABNORMAL HIGH (ref 0.44–1.00)
GFR, Estimated: 58 mL/min — ABNORMAL LOW (ref >60)
Glucose, Bld: 107 mg/dL — ABNORMAL HIGH (ref 70–99)
Potassium: 3.8 mmol/L (ref 3.5–5.1)
Sodium: 133 mmol/L — ABNORMAL LOW (ref 135–145)
Total Bilirubin: 0.7 mg/dL (ref 0.3–1.2)
Total Protein: 7 g/dL (ref 6.5–8.1)

## 2020-12-15 LAB — CBC
HCT: 34.9 % — ABNORMAL LOW (ref 36.0–46.0)
Hemoglobin: 11.9 g/dL — ABNORMAL LOW (ref 12.0–15.0)
MCH: 31.6 pg (ref 26.0–34.0)
MCHC: 34.1 g/dL (ref 30.0–36.0)
MCV: 92.8 fL (ref 80.0–100.0)
Platelets: 202 10*3/uL (ref 150–400)
RBC: 3.76 MIL/uL — ABNORMAL LOW (ref 3.87–5.11)
RDW: 13.3 % (ref 11.5–15.5)
WBC: 5.4 10*3/uL (ref 4.0–10.5)
nRBC: 0 % (ref 0.0–0.2)

## 2020-12-15 LAB — ECHOCARDIOGRAM LIMITED
Area-P 1/2: 3.99 cm2
Calc EF: 31.2 %
Height: 60 in
S' Lateral: 4.4 cm
Single Plane A2C EF: 38.9 %
Single Plane A4C EF: 27.5 %
Weight: 4320 oz

## 2020-12-15 LAB — BRAIN NATRIURETIC PEPTIDE: B Natriuretic Peptide: 62.8 pg/mL (ref 0.0–100.0)

## 2020-12-15 LAB — D-DIMER, QUANTITATIVE: D-Dimer, Quant: 2 ug/mL-FEU — ABNORMAL HIGH (ref 0.00–0.50)

## 2020-12-15 LAB — GLUCOSE, CAPILLARY
Glucose-Capillary: 120 mg/dL — ABNORMAL HIGH (ref 70–99)
Glucose-Capillary: 241 mg/dL — ABNORMAL HIGH (ref 70–99)
Glucose-Capillary: 157 mg/dL — ABNORMAL HIGH (ref 70–99)
Glucose-Capillary: 174 mg/dL — ABNORMAL HIGH (ref 70–99)

## 2020-12-15 LAB — C-REACTIVE PROTEIN: CRP: 2.4 mg/dL — ABNORMAL HIGH (ref <1.0)

## 2020-12-15 MED ORDER — PERFLUTREN LIPID MICROSPHERE
1.0000 mL | INTRAVENOUS | Status: AC | PRN
  Administered 2020-12-15: 2 mL via INTRAVENOUS
  Filled 2020-12-15: qty 10

## 2020-12-15 MED ORDER — CARVEDILOL 12.5 MG PO TABS
12.5000 mg | ORAL_TABLET | Freq: Two times a day (BID) | ORAL | Status: DC
  Administered 2020-12-15 – 2020-12-16 (×3): 12.5 mg via ORAL
  Filled 2020-12-15 (×3): qty 1

## 2020-12-15 MED ORDER — SPIRONOLACTONE 12.5 MG HALF TABLET
12.5000 mg | ORAL_TABLET | Freq: Every day | ORAL | Status: DC
  Administered 2020-12-15 – 2020-12-16 (×2): 12.5 mg via ORAL
  Filled 2020-12-15 (×2): qty 1

## 2020-12-15 MED ORDER — EMPAGLIFLOZIN 10 MG PO TABS
10.0000 mg | ORAL_TABLET | Freq: Every day | ORAL | Status: DC
  Administered 2020-12-15 – 2020-12-18 (×4): 10 mg via ORAL
  Filled 2020-12-15 (×4): qty 1

## 2020-12-15 NOTE — Progress Notes (Signed)
PROGRESS NOTE                                                                                                                                                                                                             Patient Demographics:    Sherry Hall, is a 40 y.o. female, DOB - [birth year], ZOX:[id number]  Outpatient Primary MD for the patient is Massie MaroonHollis, Lachina M, FNP   Admit date - 12/11/2020   LOS - 4  Chief Complaint  Patient presents with  . Shortness of Breath       Brief Narrative: Patient is a 40 y.o. female with PMHx of insulin-dependent DM-2, HTN, HIV (noncompliant to antiretrovirals for almost 8 months)-presented with a 2-week history of orthopnea and dyspnea on exertion -upon further evaluation-she was found to have acute systolic heart failure.  COVID-19 vaccinated status: Unvaccinated  Significant Events: 2/23>> [facility]exertional dyspnea  Significant studies: 2/23>>Chest x-ray: Interstitial infiltrates 2/23>> CTA chest: No PE.  Minimal bilateral atelectasis 2/24>> Echo: EF 35-40%, grade 2 diastolic dysfunction, + wall motion abnormalities.  COVID-19 medications: Remdesivir: 2/23>> 2/25  Antibiotics: None  Microbiology data: None  Procedures: None  Consults: ID, cardiology  DVT prophylaxis:     Subjective:   Patient in bed, appears comfortable, denies any headache, no fever, no chest pain or pressure, improved shortness of breath , no abdominal pain. No focal weakness.    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  Cardiology following.  Overall much improved will defer further management to cardiology likely will add Aldactone and Farxiga soon, continue to monitor renal function closely along  with blood pressure.    HIV: Noncompliant with ART for almost 8 months-CD4 count stable at 570.  ID consulted-started on ART on 2/24.   COVID-19 infection: Likely incidental finding-no hypoxia-no pneumonia on CT imaging-needs Remdesivir x3 days only.  No role for steroids.  D dimer is borderline elevated and she is morbidly obese have adjusted her to moderate dose Lovenox on 12/14/2020.  COVID-19 Labs: Recent Labs    12/13/20 0131 12/14/20 0102 12/15/20 0332  DDIMER 2.82* 2.57* 2.00*  CRP 1.8* 2.2* 2.4*   HTN: BP controlled-on ARB dose  dropped on 12/14/2020 due to mild AKI.  AKI.  Reduced diuretic and ARB dose.  Monitor.    Depression/anxiety: Claims she lost her 36 year old son in 2017-since that she has had anxiety issues-currently stable-continue trazodone, BuSpar/as needed Xanax-stable for outpatient follow-up with PCP.   Morbid Obesity: BMI 52 follow with PCP for weight loss.  Insulin-dependent M-2 (A1c 9.8 on 2/23): CBG stable-continue Lantus 15 units daily and SSI follow and adjust, diabetic and insulin education ordered.  Recent Labs    12/13/20 1649 12/13/20 2024 12/14/20 0731  GLUCAP 152* 215* 143*    Condition -Stable  Family Communication  : None at bedside-patient awake/alert and able to update family herself.  Code Status :  Full Code  Diet :  Diet Order            Diet heart healthy/carb modified Room service appropriate? Yes; Fluid consistency: Thin; Fluid restriction: 1500 mL Fluid  Diet effective now                  Disposition Plan  :   Status is: Inpatient  Remains inpatient appropriate because:Inpatient level of care appropriate due to severity of illness   Dispo: The patient is from: Home              Anticipated d/c is to: Home              Patient currently is not medically stable to d/c.   Difficult to place patient No    Barriers to discharge: Newly diagnosed systolic heart failure-needs inpatient optimization of heart failure  regimen before consideration of discharge.  Antimicorbials  :    Anti-infectives (From admission, onward)   Start     Dose/Rate Route Frequency Ordered Stop   12/12/20 1600  bictegravir-emtricitabine-tenofovir AF (BIKTARVY) 50-200-25 MG per tablet 1 tablet        1 tablet Oral Daily 12/12/20 1511     12/12/20 1000  remdesivir 100 mg in sodium chloride 0.9 % 100 mL IVPB       "Followed by" Linked Group Details   100 mg 200 mL/hr over 30 Minutes Intravenous Daily 12/11/20 1833 12/13/20 1005   12/12/20 0000  bictegravir-emtricitabine-tenofovir AF (BIKTARVY) 50-200-25 MG TABS tablet        1 tablet Oral Daily 12/12/20 1204     12/11/20 1930  remdesivir 200 mg in sodium chloride 0.9% 250 mL IVPB       "Followed by" Linked Group Details   200 mg 580 mL/hr over 30 Minutes Intravenous Once 12/11/20 1833 12/11/20 2042   12/11/20 1430  emtricitabine-tenofovir AF (DESCOVY) 200-25 MG per tablet 1 tablet  Status:  Discontinued        1 tablet Oral Daily 12/11/20 1416 12/11/20 1555   12/11/20 1430  dolutegravir (TIVICAY) tablet 50 mg  Status:  Discontinued        50 mg Oral Daily 12/11/20 1416 12/11/20 1555      Inpatient Medications  Scheduled Meds: . bictegravir-emtricitabine-tenofovir AF  1 tablet Oral Daily  . busPIRone  10 mg Oral TID  . carvedilol  3.125 mg Oral BID WC  . enoxaparin (LOVENOX) injection  60 mg Subcutaneous Q12H  . furosemide  40 mg Oral Daily  . insulin aspart  0-15 Units Subcutaneous TID WC  . insulin glargine  15 Units Subcutaneous QHS  . potassium chloride  20 mEq Oral Daily  . sacubitril-valsartan  1 tablet Oral BID   Continuous Infusions:  PRN Meds:.acetaminophen **OR** [DISCONTINUED] acetaminophen,  albuterol, ALPRAZolam, benzonatate, [DISCONTINUED] ondansetron **OR** ondansetron (ZOFRAN) IV, polyethylene glycol, traZODone   Time Spent in minutes  25  See all Orders from today for further details   Susa Raring M.D on 12/15/2020 at 9:32 AM  To page go to  [email or web address] - use universal password  [facility] -  Office  [phone]    Objective:   Vitals:   12/14/20 0430 12/14/20 1452 12/14/20 2041 12/15/20 0445  BP: 128/89 (!) 119/94 (!) 105/59 126/83  Pulse: 91 (!) 106 93 88  Resp: Temp: 98.2 F (36.8 C) 98.4 F (36.9 C) (!) 97.5 F (36.4 C) 98.4 F (36.9 C)  TempSrc: Oral Oral Oral Oral  SpO2: 100% 100% 99% 98%  Weight:      Height:        Wt Readings from Last 3 Encounters:  12/11/20 122.5 kg  04/02/20 127 kg  10/10/19 124.6 kg     Intake/Output Summary (Last 24 hours) at 12/15/2020 0932 Last data filed at 12/14/2020 1622 Gross per 24 hour  Intake 240 ml  Output --  Net 240 ml     Physical Exam  Awake Alert, No new F.N deficits, Normal affect [place].AT,PERRAL Supple Neck,No JVD, No cervical lymphadenopathy appriciated.  Symmetrical Chest wall movement, Good air movement bilaterally, CTAB RRR,No Gallops, Rubs or new Murmurs, No Parasternal Heave +ve B.Sounds, Abd Soft, No tenderness, No organomegaly appriciated, No rebound - guarding or rigidity. No Cyanosis, Clubbing or edema, No new Rash or bruise     Data Review:    CBC Recent Labs  Lab 12/11/20 0919 12/12/20 0214 12/13/20 0131 12/14/20 0102 12/15/20 0332  WBC 7.6 7.0 6.4 6.0 5.4  HGB 12.1 10.8* 11.8* 11.8* 11.9*  HCT 35.9* 31.4* 34.1* 34.4* 34.9*  PLT 194 174 199 195 202  MCV 95.2 93.7 92.9 92.5 92.8  MCH 32.1 32.2 32.2 31.7 31.6  MCHC 33.7 34.4 34.6 34.3 34.1  RDW 13.6 13.5 13.5 13.4 13.3    Chemistries  Recent Labs  Lab 12/11/20 0919 12/12/20 0214 12/13/20 0131 12/14/20 0102 12/15/20 0332  NA 132* 134* 136 135 133*  K 4.1 3.5 3.5 3.7 3.8  CL 103 102 105 105 106  CO2 19* 19*  GLUCOSE 309* 258* 119* 175* 107*  BUN 18 17 21* 23* 25*  CREATININE 0.94 1.14* 1.24* 1.25* 1.20*  CALCIUM 8.7* 8.2* 8.3* 8.3* 8.0*  AST  --   --  9* 12* 15  ALT  --   --  ALKPHOS  --   --  50 52 51  BILITOT  --   --   0.5 0.6 0.7   ------------------------------------------------------------------------------------------------------------------ No results for input(s): CHOL, HDL, LDLCALC, TRIG, CHOLHDL, LDLDIRECT in the last 72 hours.  Lab Results  Component Value Date   HGBA1C 9.8 (H) 12/11/2020   ------------------------------------------------------------------------------------------------------------------ No results for input(s): TSH, T4TOTAL, T3FREE, THYROIDAB in the last 72 hours.  Invalid input(s): FREET3 ------------------------------------------------------------------------------------------------------------------ No results for input(s): VITAMINB12, FOLATE, FERRITIN, TIBC, IRON, RETICCTPCT in the last 72 hours.  Coagulation profile No results for input(s): INR, PROTIME in the last 168 hours.  Recent Labs    12/14/20 0102 12/15/20 0332  DDIMER 2.57* 2.00*    Cardiac Enzymes No results for input(s): CKMB, TROPONINI, MYOGLOBIN in the last 168 hours.  Invalid input(s): CK ------------------------------------------------------------------------------------------------------------------    Component Value Date/Time   BNP 62.8 12/15/2020 0332    Micro Results Recent Results (from the past 240  hour(s))  SARS CORONAVIRUS 2 (TAT 6-24 HRS) Nasopharyngeal Nasopharyngeal Swab     Status: Abnormal   Collection Time: 12/11/20 11:18 AM   Specimen: Nasopharyngeal Swab  Result Value Ref Range Status   SARS Coronavirus 2 POSITIVE (A) NEGATIVE Final    Comment: (NOTE) SARS-CoV-2 target nucleic acids are DETECTED.  The SARS-CoV-2 RNA is generally detectable in upper and lower respiratory specimens during the acute phase of infection. Positive results are indicative of the presence of SARS-CoV-2 RNA. Clinical correlation with patient history and other diagnostic information is  necessary to determine patient infection status. Positive results do not rule out bacterial infection or  co-infection with other viruses.  The expected result is Negative.  Fact Sheet for Patients: HairSlick.no  Fact Sheet for Healthcare Providers: quierodirigir.com  This test is not yet approved or cleared by the [place] FDA and  has been authorized for detection and/or diagnosis of SARS-CoV-2 by FDA under an Emergency Use Authorization (EUA). This EUA will remain  in effect (meaning this test can be used) for the duration of the COVID-19 declaration under Section 564(b)(1) of the Act, 21 U. S.C. section 360bbb-3(b)(1), unless the authorization is terminated or revoked sooner.   Performed at [facility], [address] [place], [place] [address]     Radiology Reports DG Chest 2 View  Result Date: 12/11/2020 CLINICAL DATA:  40 year old female with shortness of breath for several weeks. EXAM: CHEST - 2 VIEW COMPARISON:  Chest radiographs 01/15/2013. FINDINGS: PA and lateral views. Lung volumes and mediastinal contours have not significantly changed since 2014. Visualized tracheal air column is within normal limits. No pneumothorax. No layering pleural effusion. However, suggestion of increased pleural fluid in the fissures compared to prior, including the right minor fissure. Superimposed widespread increased bilateral pulmonary interstitial opacity. No consolidation or confluent opacity. No acute osseous abnormality identified.  Paucity of bowel gas. IMPRESSION: Constellation suspicious for acute pulmonary interstitial edema with trace pleural fluid in the fissures. Viral/atypical respiratory infection felt less likely. Electronically Signed   By: Odessa Fleming M.D.   On: 12/11/2020 09:55   CT ANGIO CHEST PE W OR WO CONTRAST  Result Date: 12/11/2020 CLINICAL DATA:  COVID positive with exertional shortness of breath. EXAM: CT ANGIOGRAPHY CHEST WITH CONTRAST TECHNIQUE: Multidetector CT imaging of the chest was performed using the  standard protocol during bolus administration of intravenous contrast. Multiplanar CT image reconstructions and MIPs were obtained to evaluate the vascular anatomy. CONTRAST:  50mL OMNIPAQUE IOHEXOL 350 MG/ML SOLN COMPARISON:  None. FINDINGS: Cardiovascular: Satisfactory opacification of the pulmonary arteries to the segmental level. No evidence of pulmonary embolism. Normal heart size. No pericardial effusion. Mediastinum/Nodes: No enlarged mediastinal, hilar, or axillary lymph nodes. Thyroid gland, trachea, and esophagus demonstrate no significant findings. Lungs/Pleura: Very mild atelectasis and/or early infiltrate is seen within the posterior aspect of the bilateral lung bases. Small bilateral pleural effusions are noted. There is no evidence of a pneumothorax. Upper Abdomen: Multiple surgical clips are seen within the gallbladder fossa. Musculoskeletal: No chest wall abnormality. No acute or significant osseous findings. Review of the MIP images confirms the above findings. IMPRESSION: 1. No CT evidence of pulmonary embolism. 2. Very mild posterior bilateral lower lobe atelectasis and/or early infiltrate. 3. Small bilateral pleural effusions. 4. Evidence of prior cholecystectomy. Electronically Signed   By: Aram Candela M.D.   On: 12/11/2020 19:01   ECHOCARDIOGRAM COMPLETE  Result Date: 12/12/2020    ECHOCARDIOGRAM REPORT   Patient Name:  Romeka Althouse Date of Exam: 12/12/2020 Medical Rec #:  [id number]           Height:       60.0 in Accession #:    [id number]          Weight:       270.0 lb Date of Birth:  Nov 14, 1980          BSA:          2.121 m Patient Age:    39 years            BP:           128/81 mmHg Patient Gender: F                   HR:           95 bpm. Exam Location:  Inpatient Procedure: 2D Echo, Cardiac Doppler and Color Doppler Indications:    R06.02 SOB  History:        Patient has no prior history of Echocardiogram examinations.                 Signs/Symptoms:Shortness of  Breath and Dyspnea; Risk                 Factors:Current Smoker, Hypertension and Diabetes. Covid                 positive.  Sonographer:    Sheralyn Boatman RDCS Referring Phys: [phone] ANTHONY ALLEN  Sonographer Comments: Patient is morbidly obese and Technically difficult study due to poor echo windows. Image acquisition challenging due to patient body habitus. IMPRESSIONS  1. Left ventricular ejection fraction, by estimation, is 35 to 40%. The left ventricle has moderately decreased function. The left ventricle demonstrates global hypokinesis. There is mild left ventricular hypertrophy. Left ventricular diastolic parameters are consistent with Grade II diastolic dysfunction (pseudonormalization).  2. Right ventricular systolic function is normal. The right ventricular size is normal.  3. The mitral valve is normal in structure. Trivial mitral valve regurgitation. No evidence of mitral stenosis.  4. The aortic valve is normal in structure. Aortic valve regurgitation is not visualized. No aortic stenosis is present.  5. The inferior vena cava is normal in size with greater than 50% respiratory variability, suggesting right atrial pressure of 3 mmHg. FINDINGS  Left Ventricle: Left ventricular ejection fraction, by estimation, is 35 to 40%. The left ventricle has moderately decreased function. The left ventricle demonstrates global hypokinesis. The left ventricular internal cavity size was normal in size. There is mild left ventricular hypertrophy. Left ventricular diastolic parameters are consistent with Grade II diastolic dysfunction (pseudonormalization).  LV Wall Scoring: The inferior wall, basal anteroseptal segment, and mid inferoseptal segment are akinetic. Right Ventricle: The right ventricular size is normal. No increase in right ventricular wall thickness. Right ventricular systolic function is normal. Left Atrium: Left atrial size was normal in size. Right Atrium: Right atrial size was normal in size. Pericardium:  There is no evidence of pericardial effusion. Mitral Valve: The mitral valve is normal in structure. Trivial mitral valve regurgitation. No evidence of mitral valve stenosis. Tricuspid Valve: The tricuspid valve is normal in structure. Tricuspid valve regurgitation is not demonstrated. No evidence of tricuspid stenosis. Aortic Valve: The aortic valve is normal in structure. Aortic valve regurgitation is not visualized. No aortic stenosis is present. Pulmonic Valve: The pulmonic valve was normal in structure. Pulmonic valve regurgitation is not visualized. No evidence of pulmonic stenosis. Aorta: The aortic root  is normal in size and structure. Venous: The inferior vena cava is normal in size with greater than 50% respiratory variability, suggesting right atrial pressure of 3 mmHg. IAS/Shunts: No atrial level shunt detected by color flow Doppler.  LEFT VENTRICLE PLAX 2D LVIDd:         5.00 cm      Diastology LVIDs:         4.80 cm      LV e' medial:    5.87 cm/s LV PW:         1.00 cm      LV E/e' medial:  20.8 LV IVS:        1.30 cm      LV e' lateral:   5.87 cm/s LVOT diam:     1.80 cm      LV E/e' lateral: 20.8 LV SV:         55 LV SV Index:   26 LVOT Area:     2.54 cm  LV Volumes (MOD) LV vol d, MOD A2C: 98.3 ml LV vol d, MOD A4C: 120.0 ml LV vol s, MOD A2C: 65.4 ml LV vol s, MOD A4C: 72.6 ml LV SV MOD A2C:     32.9 ml LV SV MOD A4C:     120.0 ml LV SV MOD BP:      43.0 ml RIGHT VENTRICLE             IVC RV S prime:     11.40 cm/s  IVC diam: 1.40 cm TAPSE (M-mode): 2.0 cm LEFT ATRIUM             Index       RIGHT ATRIUM           Index LA diam:        3.40 cm 1.60 cm/m  RA Area:     12.10 cm LA Vol (A2C):   38.6 ml 18.20 ml/m RA Volume:   24.20 ml  11.41 ml/m LA Vol (A4C):   39.0 ml 18.39 ml/m LA Biplane Vol: 39.3 ml 18.53 ml/m  AORTIC VALVE LVOT Vmax:   122.00 cm/s LVOT Vmean:  88.200 cm/s LVOT VTI:    0.216 m  AORTA Ao Root diam: 2.60 cm Ao Asc diam:  2.90 cm MITRAL VALVE MV Area (PHT): 5.02 cm      SHUNTS MV Decel Time: 151 msec     Systemic VTI:  0.22 m MV E velocity: 122.00 cm/s  Systemic Diam: 1.80 cm MV A velocity: 80.40 cm/s MV E/A ratio:  1.52 Donato Schultz MD Electronically signed by Donato Schultz MD Signature Date/Time: 12/12/2020/12:49:32 PM    Final

## 2020-12-15 NOTE — Progress Notes (Signed)
Cardiology Progress Note  Patient ID: Sherry Hall MRN: [id number] DOB: 1981/04/10 Date of Encounter: 12/15/2020  Primary Cardiologist: No primary care provider on file.  Subjective   Chief Complaint: Short of breath  HPI: Short of breath.  Symptoms improving on medical therapy.  Not back to baseline.  ROS:  All other ROS reviewed and negative. Pertinent positives noted in the HPI.     Inpatient Medications  Scheduled Meds: . bictegravir-emtricitabine-tenofovir AF  1 tablet Oral Daily  . busPIRone  10 mg Oral TID  . carvedilol  12.5 mg Oral BID WC  . empagliflozin  10 mg Oral Daily  . enoxaparin (LOVENOX) injection  60 mg Subcutaneous Q12H  . furosemide  40 mg Oral Daily  . insulin aspart  0-15 Units Subcutaneous TID WC  . insulin glargine  15 Units Subcutaneous QHS  . potassium chloride  20 mEq Oral Daily  . sacubitril-valsartan  1 tablet Oral BID  . spironolactone  12.5 mg Oral Daily   Continuous Infusions:  PRN Meds: acetaminophen **OR** [DISCONTINUED] acetaminophen, albuterol, ALPRAZolam, benzonatate, [DISCONTINUED] ondansetron **OR** ondansetron (ZOFRAN) IV, polyethylene glycol, traZODone   Vital Signs   Vitals:   12/14/20 1452 12/14/20 2041 12/15/20 0445 12/15/20 1052  BP: (!) 119/94 (!) 105/59 126/83 121/81  Pulse: (!) 106 93 88 92  Resp: 20 18 18 18   Temp: 98.4 F (36.9 C) (!) 97.5 F (36.4 C) 98.4 F (36.9 C) 98.2 F (36.8 C)  TempSrc: Oral Oral Oral   SpO2: 100% 99% 98% 100%  Weight:      Height:        Intake/Output Summary (Last 24 hours) at 12/15/2020 1131 Last data filed at 12/14/2020 1622 Gross per 24 hour  Intake 240 ml  Output -  Net 240 ml   Last 3 Weights 12/11/2020 04/02/2020 10/10/2019  Weight (lbs) 270 lb 280 lb 274 lb 9.6 oz  Weight (kg) 122.471 kg 127.007 kg 124.558 kg      Telemetry  Overnight telemetry shows sinus rhythm in the 90s, which I personally reviewed.   ECG  The most recent ECG shows sinus tachycardia heart  rate 113, no acute ischemic changes, no evidence of infarction, which I personally reviewed.   Physical Exam   Vitals:   12/14/20 1452 12/14/20 2041 12/15/20 0445 12/15/20 1052  BP: (!) 119/94 (!) 105/59 126/83 121/81  Pulse: (!) 106 93 88 92  Resp: 20 18 18 18   Temp: 98.4 F (36.9 C) (!) 97.5 F (36.4 C) 98.4 F (36.9 C) 98.2 F (36.8 C)  TempSrc: Oral Oral Oral   SpO2: 100% 99% 98% 100%  Weight:      Height:         Intake/Output Summary (Last 24 hours) at 12/15/2020 1131 Last data filed at 12/14/2020 1622 Gross per 24 hour  Intake 240 ml  Output -  Net 240 ml    Last 3 Weights 12/11/2020 04/02/2020 10/10/2019  Weight (lbs) 270 lb 280 lb 274 lb 9.6 oz  Weight (kg) 122.471 kg 127.007 kg 124.558 kg    Body mass index is 52.73 kg/m.  General: Well nourished, well developed, in no acute distress Head: Atraumatic, normal size  Eyes: PEERLA, EOMI  Neck: Supple, no JVD Endocrine: No thryomegaly Cardiac: Normal S1, S2; RRR; no murmurs, rubs, or gallops Lungs: Clear to auscultation bilaterally, no wheezing, rhonchi or rales  Abd: Soft, nontender, no hepatomegaly  Ext: No edema, pulses 2+ Musculoskeletal: No deformities, BUE and BLE strength normal and equal Skin:  Warm and dry, no rashes   Neuro: Alert and oriented to person, place, time, and situation, CNII-XII grossly intact, no focal deficits  Psych: Normal mood and affect   Labs  High Sensitivity Troponin:   Recent Labs  Lab 12/11/20 1206 12/11/20 1600  TROPONINIHS 12 12     Cardiac EnzymesNo results for input(s): TROPONINI in the last 168 hours. No results for input(s): TROPIPOC in the last 168 hours.  Chemistry Recent Labs  Lab 12/13/20 0131 12/14/20 0102 12/15/20 0332  NA 136 135 133*  K 3.5 3.7 3.8  CL 105 105 106  CO2 22 22 19*  GLUCOSE 119* 175* 107*  BUN 21* 23* 25*  CREATININE 1.24* 1.25* 1.20*  CALCIUM 8.3* 8.3* 8.0*  PROT 6.9 6.8 7.0  ALBUMIN 2.5* 2.5* 2.4*  AST 9* 12* 15  ALT 11 12 14    ALKPHOS 50 52 51  BILITOT 0.5 0.6 0.7  GFRNONAA 56* 56* 58*  ANIONGAP 9 8 8     Hematology Recent Labs  Lab 12/13/20 0131 12/14/20 0102 12/15/20 0332  WBC 6.4 6.0 5.4  RBC 3.67* 3.72* 3.76*  HGB 11.8* 11.8* 11.9*  HCT 34.1* 34.4* 34.9*  MCV 92.9 92.5 92.8  MCH 32.2 31.7 31.6  MCHC 34.6 34.3 34.1  RDW 13.5 13.4 13.3  PLT 199 195 202   BNP Recent Labs  Lab 12/12/20 0806 12/14/20 0102 12/15/20 0332  BNP 127.6* 73.1 62.8    DDimer  Recent Labs  Lab 12/13/20 0131 12/14/20 0102 12/15/20 0332  DDIMER 2.82* 2.57* 2.00*     Radiology  No results found.  Cardiac Studies  TTE 12/12/2020 1. Left ventricular ejection fraction, by estimation, is 35 to 40%. The  left ventricle has moderately decreased function. The left ventricle  demonstrates global hypokinesis. There is mild left ventricular  hypertrophy. Left ventricular diastolic  parameters are consistent with Grade II diastolic dysfunction  (pseudonormalization).  2. Right ventricular systolic function is normal. The right ventricular  size is normal.  3. The mitral valve is normal in structure. Trivial mitral valve  regurgitation. No evidence of mitral stenosis.  4. The aortic valve is normal in structure. Aortic valve regurgitation is  not visualized. No aortic stenosis is present.  5. The inferior vena cava is normal in size with greater than 50%  respiratory variability, suggesting right atrial pressure of 3 mmHg.   Patient Profile  Sherry Hall is a 40 y.o. female with diabetes, morbid obesity, HIV who was admitted on 12/12/2020 for new onset systolic heart failure.  Assessment & Plan   1.  New onset systolic heart failure, EF 35-40% -Admitted with new onset systolic heart failure.  EKG is nonischemic.  Uncontrolled hypertension, obesity diabetes and HIV is present.  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  Troponins on admission were negative and flat.  I really doubt this is due to  coronary disease.  CT PE study on admission was 0 coronary calcium. -Increase Coreg to 12.5 twice a day. -Add Aldactone 12.5 mg daily. -Add Jardiance 10 mg daily. -Start Entresto 24-26 mg twice daily. -Repeat limited echocardiogram with contrast pending.  No real wall motion abnormality on my review.  We will make sure. -Plan for discharge tomorrow.  Outpatient follow-up with me.  For questions or updates, please contact [facility] Please consult [email or web address] for contact info under   Time Spent with Patient: I have spent a total of 25 minutes with patient reviewing hospital notes, telemetry, EKGs, labs and examining the patient  as well as establishing an assessment and plan that was discussed with the patient.  > 50% of time was spent in direct patient care.    Signed, Lenna Gilford. Flora Lipps, MD [facility]  [facility]  12/15/2020 11:31 AM

## 2020-12-15 NOTE — Progress Notes (Signed)
  Echocardiogram 2D Echocardiogram limited with defintiy has been performed.  Sherry Hall 12/15/2020, 2:41 PM

## 2020-12-16 DIAGNOSIS — B20 Human immunodeficiency virus [HIV] disease: Secondary | ICD-10-CM | POA: Diagnosis not present

## 2020-12-16 LAB — CBC
HCT: 37 % (ref 36.0–46.0)
Hemoglobin: 11.8 g/dL — ABNORMAL LOW (ref 12.0–15.0)
MCH: 30.9 pg (ref 26.0–34.0)
MCHC: 31.9 g/dL (ref 30.0–36.0)
MCV: 96.9 fL (ref 80.0–100.0)
Platelets: 215 10*3/uL (ref 150–400)
RBC: 3.82 MIL/uL — ABNORMAL LOW (ref 3.87–5.11)
RDW: 13.6 % (ref 11.5–15.5)
WBC: 6.4 10*3/uL (ref 4.0–10.5)
nRBC: 0 % (ref 0.0–0.2)

## 2020-12-16 LAB — C-REACTIVE PROTEIN: CRP: 2.4 mg/dL — ABNORMAL HIGH (ref <1.0)

## 2020-12-16 LAB — COMPREHENSIVE METABOLIC PANEL
ALT: 16 U/L (ref 0–44)
AST: 17 U/L (ref 15–41)
Albumin: 2.5 g/dL — ABNORMAL LOW (ref 3.5–5.0)
Alkaline Phosphatase: 53 U/L (ref 38–126)
Anion gap: 11 (ref 5–15)
BUN: 31 mg/dL — ABNORMAL HIGH (ref 6–20)
CO2: 20 mmol/L — ABNORMAL LOW (ref 22–32)
Calcium: 8.4 mg/dL — ABNORMAL LOW (ref 8.9–10.3)
Chloride: 107 mmol/L (ref 98–111)
Creatinine, Ser: 1.54 mg/dL — ABNORMAL HIGH (ref 0.44–1.00)
GFR, Estimated: 43 mL/min — ABNORMAL LOW (ref >60)
Glucose, Bld: 80 mg/dL (ref 70–99)
Potassium: 4.2 mmol/L (ref 3.5–5.1)
Sodium: 138 mmol/L (ref 135–145)
Total Bilirubin: 0.8 mg/dL (ref 0.3–1.2)
Total Protein: 7.2 g/dL (ref 6.5–8.1)

## 2020-12-16 LAB — GLUCOSE, CAPILLARY
Glucose-Capillary: 128 mg/dL — ABNORMAL HIGH (ref 70–99)
Glucose-Capillary: 135 mg/dL — ABNORMAL HIGH (ref 70–99)
Glucose-Capillary: 103 mg/dL — ABNORMAL HIGH (ref 70–99)
Glucose-Capillary: 179 mg/dL — ABNORMAL HIGH (ref 70–99)

## 2020-12-16 LAB — BRAIN NATRIURETIC PEPTIDE: B Natriuretic Peptide: 60.1 pg/mL (ref 0.0–100.0)

## 2020-12-16 LAB — D-DIMER, QUANTITATIVE: D-Dimer, Quant: 1.61 ug/mL-FEU — ABNORMAL HIGH (ref 0.00–0.50)

## 2020-12-16 MED ORDER — LACTATED RINGERS IV SOLN: INTRAVENOUS | Status: DC

## 2020-12-16 MED ORDER — LACTATED RINGERS IV SOLN: INTRAVENOUS | Status: AC

## 2020-12-16 MED ORDER — LIVING WELL WITH DIABETES BOOK
Freq: Once | Status: AC
  Filled 2020-12-16: qty 1

## 2020-12-16 NOTE — Progress Notes (Signed)
PROGRESS NOTE                                                                                                                                                                                                             Patient Demographics:    Sherry Hall, is a 40 y.o. female, DOB - 1981-06-26, TSV:[id number]  Outpatient Primary MD for the patient is Sherry Maroon, FNP   Admit date - 12/11/2020   LOS - 5  Chief Complaint  Patient presents with  . Shortness of Breath       Brief Narrative: Patient is a 40 y.o. female with PMHx of insulin-dependent DM-2, HTN, HIV (noncompliant to antiretrovirals for almost 8 months)-presented with a 2-week history of orthopnea and dyspnea on exertion -upon further evaluation-she was found to have acute systolic heart failure.  COVID-19 vaccinated status: Unvaccinated  Significant Events: 2/23>> Admit to [facility] for orthopnea/exertional dyspnea  Significant studies: 2/23>>Chest x-ray: Interstitial infiltrates 2/23>> CTA chest: No PE.  Minimal bilateral atelectasis 2/24>> Echo: EF 35-40%, grade 2 diastolic dysfunction, + wall motion abnormalities.  COVID-19 medications: Remdesivir: 2/23>> 2/25  Antibiotics: None  Microbiology data: None  Procedures: None  Consults: ID, cardiology  DVT prophylaxis:     Subjective:   Patient in a recliner, appears comfortable, denies any headache, no fever, no chest pain or pressure, no shortness of breath , no abdominal pain. No focal weakness.   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    AKI.  See above.  HIV: Noncompliant with ART for almost 8 months-CD4 count stable at 570.  ID consulted-started on ART on 2/24.  Hold on compliance post discharge outpatient ID follow-up.  COVID-19 infection: Likely incidental finding-no hypoxia-no pneumonia on CT imaging-needs Remdesivir x3 days only.  No role for steroids.  D dimer is borderline elevated and she is morbidly obese have adjusted her to moderate dose Lovenox on 12/14/2020.  COVID-19 Labs:  Recent Labs    12/14/20 0102 12/15/20 0332 12/16/20  0107  DDIMER 2.57* 2.00* 1.61*  CRP 2.2* 2.4* 2.4*    HTN: BP controlled-on ARB dose dropped on 12/14/2020 due to mild AKI.  Depression/anxiety: Claims she lost her 36 year old son in 2017-since that she has had anxiety issues-currently stable-continue trazodone, BuSpar/as needed Xanax-stable for outpatient follow-up with PCP.   Morbid Obesity: BMI 52 follow with PCP for weight loss.  Insulin-dependent M-2 (A1c 9.8 on 2/23): CBG stable-continue Lantus 15 units daily and SSI follow and adjust, diabetic and insulin education ordered.  Recent Labs    12/13/20 1649 12/13/20 2024 12/14/20 0731  GLUCAP 152* 215* 143*    Condition - Stable  Family Communication  : None at bedside-patient awake/alert and able to update family herself.  Code Status :  Full Code  Diet :  Diet Order            Diet heart healthy/carb modified Room service appropriate? Yes; Fluid consistency: Thin; Fluid restriction: 1500 mL Fluid  Diet effective now                  Disposition Plan  :   Status is: Inpatient  Remains inpatient appropriate because:Inpatient level of care appropriate due to severity of illness   Dispo: The patient is from: Home              Anticipated d/c is to: Home              Patient currently is not medically stable to d/c.   Difficult to place patient No    Barriers to discharge: Newly diagnosed systolic heart failure-needs  inpatient optimization of heart failure regimen before consideration of discharge.  Antimicorbials  :    Anti-infectives (From admission, onward)   Start     Dose/Rate Route Frequency Ordered Stop   12/12/20 1600  bictegravir-emtricitabine-tenofovir AF (BIKTARVY) 50-200-25 MG per tablet 1 tablet        1 tablet Oral Daily 12/12/20 1511     12/12/20 1000  remdesivir 100 mg in sodium chloride 0.9 % 100 mL IVPB       "Followed by" Linked Group Details   100 mg 200 mL/hr over 30 Minutes Intravenous Daily 12/11/20 1833 12/13/20 1005   12/12/20 0000  bictegravir-emtricitabine-tenofovir AF (BIKTARVY) 50-200-25 MG TABS tablet        1 tablet Oral Daily 12/12/20 1204     12/11/20 1930  remdesivir 200 mg in sodium chloride 0.9% 250 mL IVPB       "Followed by" Linked Group Details   200 mg 580 mL/hr over 30 Minutes Intravenous Once 12/11/20 1833 12/11/20 2042   12/11/20 1430  emtricitabine-tenofovir AF (DESCOVY) 200-25 MG per tablet 1 tablet  Status:  Discontinued        1 tablet Oral Daily 12/11/20 1416 12/11/20 1555   12/11/20 1430  dolutegravir (TIVICAY) tablet 50 mg  Status:  Discontinued        50 mg Oral Daily 12/11/20 1416 12/11/20 1555      Inpatient Medications  Scheduled Meds: . bictegravir-emtricitabine-tenofovir AF  1 tablet Oral Daily  . busPIRone  10 mg Oral TID  . carvedilol  12.5 mg Oral BID WC  . empagliflozin  10 mg Oral Daily  . enoxaparin (LOVENOX) injection  60 mg Subcutaneous Q12H  . insulin aspart  0-15 Units Subcutaneous TID WC  . insulin glargine  15 Units Subcutaneous QHS   Continuous Infusions: . lactated ringers     PRN Meds:.acetaminophen **OR** [DISCONTINUED] acetaminophen, albuterol, ALPRAZolam, benzonatate, [  DISCONTINUED] ondansetron **OR** ondansetron (ZOFRAN) IV, polyethylene glycol, traZODone   Time Spent in minutes  25  See all Orders from today for further details   Susa Raring M.D on 12/16/2020 at 10:05 AM  To page go to [email or web address] - use  universal password  [facility] -  Office  [phone]    Objective:   Vitals:   12/15/20 2034 12/16/20 0439 12/16/20 0810 12/16/20 0854  BP: 118/80 112/72  127/88  Pulse: 89 86  91  Resp: Temp: 97.6 F (36.4 C) 97.8 F (36.6 C)  98.2 F (36.8 C)  TempSrc: Oral Oral    SpO2: 99% 100% 94% 95%  Weight:      Height:        Wt Readings from Last 3 Encounters:  12/11/20 122.5 kg  04/02/20 127 kg  10/10/19 124.6 kg    No intake or output data in the 24 hours ending 12/16/20 1005   Physical Exam  Awake Alert, No new F.N deficits, Normal affect [place].AT,PERRAL Supple Neck,No JVD, No cervical lymphadenopathy appriciated.  Symmetrical Chest wall movement, Good air movement bilaterally, CTAB RRR,No Gallops, Rubs or new Murmurs, No Parasternal Heave +ve B.Sounds, Abd Soft, No tenderness, No organomegaly appriciated, No rebound - guarding or rigidity. No Cyanosis, Clubbing or edema, No new Rash or bruise    Data Review:    CBC Recent Labs  Lab 12/12/20 0214 12/13/20 0131 12/14/20 0102 12/15/20 0332 12/16/20 0107  WBC 7.0 6.4 6.0 5.4 6.4  HGB 10.8* 11.8* 11.8* 11.9* 11.8*  HCT 31.4* 34.1* 34.4* 34.9* 37.0  PLT 174 199 195 202 215  MCV 93.7 92.9 92.5 92.8 96.9  MCH 32.2 32.2 31.7 31.6 30.9  MCHC 34.4 34.6 34.3 34.1 31.9  RDW 13.5 13.5 13.4 13.3 13.6    Chemistries  Recent Labs  Lab 12/12/20 0214 12/13/20 0131 12/14/20 0102 12/15/20 0332 12/16/20 0107  NA 134* 136 135 133* 138  K 3.5 3.5 3.7 3.8 4.2  CL 102 105 105 106 107  CO2 19* 20*  GLUCOSE 258* 119* 175* 107* 80  BUN 17 21* 23* 25* 31*  CREATININE 1.14* 1.24* 1.25* 1.20* 1.54*  CALCIUM 8.2* 8.3* 8.3* 8.0* 8.4*  AST  --  9* 12* 15 17  ALT  --  ALKPHOS  --  50 52 51 53  BILITOT  --  0.5 0.6 0.7 0.8   ------------------------------------------------------------------------------------------------------------------ No results for input(s): CHOL, HDL, LDLCALC,  TRIG, CHOLHDL, LDLDIRECT in the last 72 hours.  Lab Results  Component Value Date   HGBA1C 9.8 (H) 12/11/2020   ------------------------------------------------------------------------------------------------------------------ No results for input(s): TSH, T4TOTAL, T3FREE, THYROIDAB in the last 72 hours.  Invalid input(s): FREET3 ------------------------------------------------------------------------------------------------------------------ No results for input(s): VITAMINB12, FOLATE, FERRITIN, TIBC, IRON, RETICCTPCT in the last 72 hours.  Coagulation profile No results for input(s): INR, PROTIME in the last 168 hours.  Recent Labs    12/15/20 0332 12/16/20 0107  DDIMER 2.00* 1.61*    Cardiac Enzymes No results for input(s): CKMB, TROPONINI, MYOGLOBIN in the last 168 hours.  Invalid input(s): CK ------------------------------------------------------------------------------------------------------------------    Component Value Date/Time   BNP 60.1 12/16/2020 0107    Micro Results Recent Results (from the past 240 hour(s))  SARS CORONAVIRUS 2 (TAT 6-24 HRS) Nasopharyngeal Nasopharyngeal Swab     Status: Abnormal   Collection Time: 12/11/20 11:18 AM   Specimen: Nasopharyngeal Swab  Result Value Ref Range Status   SARS Coronavirus  2 POSITIVE (A) NEGATIVE Final    Comment: (NOTE) SARS-CoV-2 target nucleic acids are DETECTED.  The SARS-CoV-2 RNA is generally detectable in upper and lower respiratory specimens during the acute phase of infection. Positive results are indicative of the presence of SARS-CoV-2 RNA. Clinical correlation with patient history and other diagnostic information is  necessary to determine patient infection status. Positive results do not rule out bacterial infection or co-infection with other viruses.  The expected result is Negative.  Fact Sheet for Patients: HairSlick.nohttps://www.fda.gov/media/138098/download  Fact Sheet for Healthcare  Providers: quierodirigir.comhttps://www.fda.gov/media/138095/download  This test is not yet approved or cleared by the [place] FDA and  has been authorized for detection and/or diagnosis of SARS-CoV-2 by FDA under an Emergency Use Authorization (EUA). This EUA will remain  in effect (meaning this test can be used) for the duration of the COVID-19 declaration under Section 564(b)(1) of the Act, 21 U. S.C. section 360bbb-3(b)(1), unless the authorization is terminated or revoked sooner.   Performed at [facility], [address] [place], [place] [address]     Radiology Reports DG Chest 2 View  Result Date: 12/11/2020 CLINICAL DATA:  40 year old female with shortness of breath for several weeks. EXAM: CHEST - 2 VIEW COMPARISON:  Chest radiographs 01/15/2013. FINDINGS: PA and lateral views. Lung volumes and mediastinal contours have not significantly changed since 2014. Visualized tracheal air column is within normal limits. No pneumothorax. No layering pleural effusion. However, suggestion of increased pleural fluid in the fissures compared to prior, including the right minor fissure. Superimposed widespread increased bilateral pulmonary interstitial opacity. No consolidation or confluent opacity. No acute osseous abnormality identified.  Paucity of bowel gas. IMPRESSION: Constellation suspicious for acute pulmonary interstitial edema with trace pleural fluid in the fissures. Viral/atypical respiratory infection felt less likely. Electronically Signed   By: Odessa FlemingH  Hall M.D.   On: 12/11/2020 09:55   CT ANGIO CHEST PE W OR WO CONTRAST  Result Date: 12/11/2020 CLINICAL DATA:  COVID positive with exertional shortness of breath. EXAM: CT ANGIOGRAPHY CHEST WITH CONTRAST TECHNIQUE: Multidetector CT imaging of the chest was performed using the standard protocol during bolus administration of intravenous contrast. Multiplanar CT image reconstructions and MIPs were obtained to evaluate the vascular anatomy.  CONTRAST:  68mL OMNIPAQUE IOHEXOL 350 MG/ML SOLN COMPARISON:  None. FINDINGS: Cardiovascular: Satisfactory opacification of the pulmonary arteries to the segmental level. No evidence of pulmonary embolism. Normal heart size. No pericardial effusion. Mediastinum/Nodes: No enlarged mediastinal, hilar, or axillary lymph nodes. Thyroid gland, trachea, and esophagus demonstrate no significant findings. Lungs/Pleura: Very mild atelectasis and/or early infiltrate is seen within the posterior aspect of the bilateral lung bases. Small bilateral pleural effusions are noted. There is no evidence of a pneumothorax. Upper Abdomen: Multiple surgical clips are seen within the gallbladder fossa. Musculoskeletal: No chest wall abnormality. No acute or significant osseous findings. Review of the MIP images confirms the above findings. IMPRESSION: 1. No CT evidence of pulmonary embolism. 2. Very mild posterior bilateral lower lobe atelectasis and/or early infiltrate. 3. Small bilateral pleural effusions. 4. Evidence of prior cholecystectomy. Electronically Signed   By: Aram Candelahaddeus  Houston M.D.   On: 12/11/2020 19:01   ECHOCARDIOGRAM COMPLETE  Result Date: 12/12/2020    ECHOCARDIOGRAM REPORT   Patient Name:   Sherry ClossBRIDGETTE Taddei Date of Exam: 12/12/2020 Medical Rec #:  [id number]           Height:       60.0 in Accession #:    [id number]  Weight:       270.0 lb Date of Birth:  1980-12-04          BSA:          2.121 m Patient Age:    39 years            BP:           128/81 mmHg Patient Gender: F                   HR:           95 bpm. Exam Location:  Inpatient Procedure: 2D Echo, Cardiac Doppler and Color Doppler Indications:    R06.02 SOB  History:        Patient has no prior history of Echocardiogram examinations.                 Signs/Symptoms:Shortness of Breath and Dyspnea; Risk                 Factors:Current Smoker, Hypertension and Diabetes. Covid                 positive.  Sonographer:    Sheralyn Boatman RDCS Referring  Phys: [phone] ANTHONY ALLEN  Sonographer Comments: Patient is morbidly obese and Technically difficult study due to poor echo windows. Image acquisition challenging due to patient body habitus. IMPRESSIONS  1. Left ventricular ejection fraction, by estimation, is 35 to 40%. The left ventricle has moderately decreased function. The left ventricle demonstrates global hypokinesis. There is mild left ventricular hypertrophy. Left ventricular diastolic parameters are consistent with Grade II diastolic dysfunction (pseudonormalization).  2. Right ventricular systolic function is normal. The right ventricular size is normal.  3. The mitral valve is normal in structure. Trivial mitral valve regurgitation. No evidence of mitral stenosis.  4. The aortic valve is normal in structure. Aortic valve regurgitation is not visualized. No aortic stenosis is present.  5. The inferior vena cava is normal in size with greater than 50% respiratory variability, suggesting right atrial pressure of 3 mmHg. FINDINGS  Left Ventricle: Left ventricular ejection fraction, by estimation, is 35 to 40%. The left ventricle has moderately decreased function. The left ventricle demonstrates global hypokinesis. The left ventricular internal cavity size was normal in size. There is mild left ventricular hypertrophy. Left ventricular diastolic parameters are consistent with Grade II diastolic dysfunction (pseudonormalization).  LV Wall Scoring: The inferior wall, basal anteroseptal segment, and mid inferoseptal segment are akinetic. Right Ventricle: The right ventricular size is normal. No increase in right ventricular wall thickness. Right ventricular systolic function is normal. Left Atrium: Left atrial size was normal in size. Right Atrium: Right atrial size was normal in size. Pericardium: There is no evidence of pericardial effusion. Mitral Valve: The mitral valve is normal in structure. Trivial mitral valve regurgitation. No evidence of mitral valve  stenosis. Tricuspid Valve: The tricuspid valve is normal in structure. Tricuspid valve regurgitation is not demonstrated. No evidence of tricuspid stenosis. Aortic Valve: The aortic valve is normal in structure. Aortic valve regurgitation is not visualized. No aortic stenosis is present. Pulmonic Valve: The pulmonic valve was normal in structure. Pulmonic valve regurgitation is not visualized. No evidence of pulmonic stenosis. Aorta: The aortic root is normal in size and structure. Venous: The inferior vena cava is normal in size with greater than 50% respiratory variability, suggesting right atrial pressure of 3 mmHg. IAS/Shunts: No atrial level shunt detected by color flow Doppler.  LEFT VENTRICLE PLAX 2D LVIDd:  5.00 cm      Diastology LVIDs:         4.80 cm      LV e' medial:    5.87 cm/s LV PW:         1.00 cm      LV E/e' medial:  20.8 LV IVS:        1.30 cm      LV e' lateral:   5.87 cm/s LVOT diam:     1.80 cm      LV E/e' lateral: 20.8 LV SV:         55 LV SV Index:   26 LVOT Area:     2.54 cm  LV Volumes (MOD) LV vol d, MOD A2C: 98.3 ml LV vol d, MOD A4C: 120.0 ml LV vol s, MOD A2C: 65.4 ml LV vol s, MOD A4C: 72.6 ml LV SV MOD A2C:     32.9 ml LV SV MOD A4C:     120.0 ml LV SV MOD BP:      43.0 ml RIGHT VENTRICLE             IVC RV S prime:     11.40 cm/s  IVC diam: 1.40 cm TAPSE (M-mode): 2.0 cm LEFT ATRIUM             Index       RIGHT ATRIUM           Index LA diam:        3.40 cm 1.60 cm/m  RA Area:     12.10 cm LA Vol (A2C):   38.6 ml 18.20 ml/m RA Volume:   24.20 ml  11.41 ml/m LA Vol (A4C):   39.0 ml 18.39 ml/m LA Biplane Vol: 39.3 ml 18.53 ml/m  AORTIC VALVE LVOT Vmax:   122.00 cm/s LVOT Vmean:  88.200 cm/s LVOT VTI:    0.216 m  AORTA Ao Root diam: 2.60 cm Ao Asc diam:  2.90 cm MITRAL VALVE MV Area (PHT): 5.02 cm     SHUNTS MV Decel Time: 151 msec     Systemic VTI:  0.22 m MV E velocity: 122.00 cm/s  Systemic Diam: 1.80 cm MV A velocity: 80.40 cm/s MV E/A ratio:  1.52 Donato Schultz MD  Electronically signed by Donato Schultz MD Signature Date/Time: 12/12/2020/12:49:32 PM    Final    ECHOCARDIOGRAM LIMITED  Result Date: 12/15/2020    ECHOCARDIOGRAM LIMITED REPORT   Patient Name:   PREETHI Hashimi Date of Exam: 12/15/2020 Medical Rec #:  [id number]           Height:       60.0 in Accession #:    [id number]          Weight:       270.0 lb Date of Birth:  26-Feb-1981          BSA:          2.121 m Patient Age:    39 years            BP:           121/81 mmHg Patient Gender: F                   HR:           93 bpm. Exam Location:  Inpatient Procedure: Limited Echo, Limited Color Doppler, Cardiac Doppler and Intracardiac            Opacification Agent Indications:  CHF-Acute Systolic I50.21  History:        Patient has prior history of Echocardiogram examinations, most                 recent 12/12/2020. Pulmonary HTN. Shortness of Breath and                 Dyspnea; Risk Factors:Current Smoker, Hypertension and Diabetes.                 Covid positive.  Sonographer:    Leta Jungling RDCS Referring Phys: [id number] Ronnald Ramp O'NEAL IMPRESSIONS  1. Left ventricular ejection fraction, by estimation, is 35 to 40%. The left ventricle has moderately decreased function. The left ventricle demonstrates global hypokinesis. Left ventricular diastolic parameters are consistent with Grade I diastolic dysfunction (impaired relaxation).  2. Right ventricular systolic function is normal. The right ventricular size is normal.  3. The aortic valve was not well visualized.  4. Limited echocardiogram FINDINGS  Left Ventricle: Left ventricular ejection fraction, by estimation, is 35 to 40%. The left ventricle has moderately decreased function. The left ventricle demonstrates global hypokinesis. Definity contrast agent was given IV to delineate the left ventricular endocardial borders. There is no left ventricular hypertrophy. Left ventricular diastolic parameters are consistent with Grade I diastolic dysfunction  (impaired relaxation). Normal left ventricular filling pressure. Right Ventricle: The right ventricular size is normal. No increase in right ventricular wall thickness. Right ventricular systolic function is normal. Aortic Valve: The aortic valve was not well visualized. Pulmonic Valve: The pulmonic valve was not well visualized. Pulmonic valve regurgitation is not visualized. No evidence of pulmonic stenosis. Aorta: The aortic root is normal in size and structure. LEFT VENTRICLE PLAX 2D LVIDd:         5.00 cm      Diastology LVIDs:         4.40 cm      LV e' medial:    5.11 cm/s LV PW:         0.80 cm      LV E/e' medial:  11.5 LV IVS:        0.90 cm      LV e' lateral:   6.65 cm/s LVOT diam:     1.70 cm      LV E/e' lateral: 8.8 LV SV:         30 LV SV Index:   14 LVOT Area:     2.27 cm  LV Volumes (MOD) LV vol d, MOD A2C: 126.7 ml LV vol d, MOD A4C: 142.5 ml LV vol s, MOD A2C: 77.4 ml LV vol s, MOD A4C: 103.3 ml LV SV MOD A2C:     49.3 ml LV SV MOD A4C:     142.5 ml LV SV MOD BP:      41.9 ml LEFT ATRIUM         Index LA diam:    3.50 cm 1.65 cm/m  AORTIC VALVE LVOT Vmax:   96.00 cm/s LVOT Vmean:  61.400 cm/s LVOT VTI:    0.134 m MITRAL VALVE MV Area (PHT): 3.99 cm    SHUNTS MV Decel Time: 190 msec    Systemic VTI:  0.13 m MV E velocity: 58.70 cm/s  Systemic Diam: 1.70 cm MV A velocity: 78.40 cm/s MV E/A ratio:  0.75 Dina Rich MD Electronically signed by Dina Rich MD Signature Date/Time: 12/15/2020/2:47:04 PM    Final

## 2020-12-16 NOTE — Progress Notes (Signed)
Cardiology Progress Note  Patient ID: Sherry Hall MRN: [id number] DOB: 1980/11/15 Date of Encounter: 12/16/2020  Primary Cardiologist: No primary care provider on file.  Subjective   Chief Complaint: None  HPI: Discharge was planned.  Creatinine bumped.  Hospital medicine to give intravenous fluids.  Entresto on hold.  ROS:  All other ROS reviewed and negative. Pertinent positives noted in the HPI.     Inpatient Medications  Scheduled Meds: . bictegravir-emtricitabine-tenofovir AF  1 tablet Oral Daily  . busPIRone  10 mg Oral TID  . carvedilol  12.5 mg Oral BID WC  . empagliflozin  10 mg Oral Daily  . enoxaparin (LOVENOX) injection  60 mg Subcutaneous Q12H  . insulin aspart  0-15 Units Subcutaneous TID WC  . insulin glargine  15 Units Subcutaneous QHS   Continuous Infusions: . lactated ringers     PRN Meds: acetaminophen **OR** [DISCONTINUED] acetaminophen, albuterol, ALPRAZolam, benzonatate, [DISCONTINUED] ondansetron **OR** ondansetron (ZOFRAN) IV, polyethylene glycol, traZODone   Vital Signs   Vitals:   12/15/20 2034 12/16/20 0439 12/16/20 0810 12/16/20 0854  BP: 118/80 112/72  127/88  Pulse: 89 86  91  Resp: 18 18  12   Temp: 97.6 F (36.4 C) 97.8 F (36.6 C)  98.2 F (36.8 C)  TempSrc: Oral Oral    SpO2: 99% 100% 94% 95%  Weight:      Height:       No intake or output data in the 24 hours ending 12/16/20 1125 Last 3 Weights 12/11/2020 04/02/2020 10/10/2019  Weight (lbs) 270 lb 280 lb 274 lb 9.6 oz  Weight (kg) 122.471 kg 127.007 kg 124.558 kg      Telemetry  Overnight telemetry shows sinus rhythm in the 70s, which I personally reviewed.   Physical Exam   Vitals:   12/15/20 2034 12/16/20 0439 12/16/20 0810 12/16/20 0854  BP: 118/80 112/72  127/88  Pulse: 89 86  91  Resp: 18 18  12   Temp: 97.6 F (36.4 C) 97.8 F (36.6 C)  98.2 F (36.8 C)  TempSrc: Oral Oral    SpO2: 99% 100% 94% 95%  Weight:      Height:       No intake or output data  in the 24 hours ending 12/16/20 1125  Last 3 Weights 12/11/2020 04/02/2020 10/10/2019  Weight (lbs) 270 lb 280 lb 274 lb 9.6 oz  Weight (kg) 122.471 kg 127.007 kg 124.558 kg    Body mass index is 52.73 kg/m.  General: Well nourished, well developed, in no acute distress Head: Atraumatic, normal size  Eyes: PEERLA, EOMI  Neck: Supple, no JVD Endocrine: No thryomegaly Cardiac: Normal S1, S2; RRR; no murmurs, rubs, or gallops Lungs: Clear to auscultation bilaterally, no wheezing, rhonchi or rales  Abd: Soft, nontender, no hepatomegaly  Ext: No edema, pulses 2+ Musculoskeletal: No deformities, BUE and BLE strength normal and equal Skin: Warm and dry, no rashes   Neuro: Alert and oriented to person, place, time, and situation, CNII-XII grossly intact, no focal deficits  Psych: Normal mood and affect   Labs  High Sensitivity Troponin:   Recent Labs  Lab 12/11/20 1206 12/11/20 1600  TROPONINIHS 12 12     Cardiac EnzymesNo results for input(s): TROPONINI in the last 168 hours. No results for input(s): TROPIPOC in the last 168 hours.  Chemistry Recent Labs  Lab 12/14/20 0102 12/15/20 0332 12/16/20 0107  NA 135 133* 138  K 3.7 3.8 4.2  CL 105 106 107  CO2 22 19* 20*  GLUCOSE 175* 107* 80  BUN 23* 25* 31*  CREATININE 1.25* 1.20* 1.54*  CALCIUM 8.3* 8.0* 8.4*  PROT 6.8 7.0 7.2  ALBUMIN 2.5* 2.4* 2.5*  AST 12* 15 17  ALT 12 14 16   ALKPHOS 52 51 53  BILITOT 0.6 0.7 0.8  GFRNONAA 56* 58* 43*  ANIONGAP 8 8 11     Hematology Recent Labs  Lab 12/14/20 0102 12/15/20 0332 12/16/20 0107  WBC 6.0 5.4 6.4  RBC 3.72* 3.76* 3.82*  HGB 11.8* 11.9* 11.8*  HCT 34.4* 34.9* 37.0  MCV 92.5 92.8 96.9  MCH 31.7 31.6 30.9  MCHC 34.3 34.1 31.9  RDW 13.4 13.3 13.6  PLT 195 202 215   BNP Recent Labs  Lab 12/14/20 0102 12/15/20 0332 12/16/20 0107  BNP 73.1 62.8 60.1    DDimer  Recent Labs  Lab 12/14/20 0102 12/15/20 0332 12/16/20 0107  DDIMER 2.57* 2.00* 1.61*      Radiology  ECHOCARDIOGRAM LIMITED  Result Date: 12/15/2020    ECHOCARDIOGRAM LIMITED REPORT   Patient Name:   Sherry Hall Date of Exam: 12/15/2020 Medical Rec #:  Hughie Closs           Height:       60.0 in Accession #:    12/17/2020          Weight:       270.0 lb Date of Birth:  [birth year]          BSA:          2.121 m Patient Age:    39 years            BP:           121/81 mmHg Patient Gender: F                   HR:           93 bpm. Exam Location:  Inpatient Procedure: Limited Echo, Limited Color Doppler, Cardiac Doppler and Intracardiac            Opacification Agent Indications:    CHF-Acute Systolic I50.21  History:        Patient has prior history of Echocardiogram examinations, most                 recent 12/12/2020. Pulmonary HTN. Shortness of Breath and                 Dyspnea; Risk Factors:Current Smoker, Hypertension and Diabetes.                 Covid positive.  Sonographer:    [birth year]1979 RDCS Referring Phys: 12/14/2020 Leta Jungling O'NEAL IMPRESSIONS  1. Left ventricular ejection fraction, by estimation, is 35 to 40%. The left ventricle has moderately decreased function. The left ventricle demonstrates global hypokinesis. Left ventricular diastolic parameters are consistent with Grade I diastolic dysfunction (impaired relaxation).  2. Right ventricular systolic function is normal. The right ventricular size is normal.  3. The aortic valve was not well visualized.  4. Limited echocardiogram FINDINGS  Left Ventricle: Left ventricular ejection fraction, by estimation, is 35 to 40%. The left ventricle has moderately decreased function. The left ventricle demonstrates global hypokinesis. Definity contrast agent was given IV to delineate the left ventricular endocardial borders. There is no left ventricular hypertrophy. Left ventricular diastolic parameters are consistent with Grade I diastolic dysfunction (impaired relaxation). Normal left ventricular filling pressure. Right Ventricle: The  right ventricular size is normal. No increase in right ventricular  wall thickness. Right ventricular systolic function is normal. Aortic Valve: The aortic valve was not well visualized. Pulmonic Valve: The pulmonic valve was not well visualized. Pulmonic valve regurgitation is not visualized. No evidence of pulmonic stenosis. Aorta: The aortic root is normal in size and structure. LEFT VENTRICLE PLAX 2D LVIDd:         5.00 cm      Diastology LVIDs:         4.40 cm      LV e' medial:    5.11 cm/s LV PW:         0.80 cm      LV E/e' medial:  11.5 LV IVS:        0.90 cm      LV e' lateral:   6.65 cm/s LVOT diam:     1.70 cm      LV E/e' lateral: 8.8 LV SV:         30 LV SV Index:   14 LVOT Area:     2.27 cm  LV Volumes (MOD) LV vol d, MOD A2C: 126.7 ml LV vol d, MOD A4C: 142.5 ml LV vol s, MOD A2C: 77.4 ml LV vol s, MOD A4C: 103.3 ml LV SV MOD A2C:     49.3 ml LV SV MOD A4C:     142.5 ml LV SV MOD BP:      41.9 ml LEFT ATRIUM         Index LA diam:    3.50 cm 1.65 cm/m  AORTIC VALVE LVOT Vmax:   96.00 cm/s LVOT Vmean:  61.400 cm/s LVOT VTI:    0.134 m MITRAL VALVE MV Area (PHT): 3.99 cm    SHUNTS MV Decel Time: 190 msec    Systemic VTI:  0.13 m MV E velocity: 58.70 cm/s  Systemic Diam: 1.70 cm MV A velocity: 78.40 cm/s MV E/A ratio:  0.75 Dina RichJonathan Branch MD Electronically signed by Dina RichJonathan Branch MD Signature Date/Time: 12/15/2020/2:47:04 PM    Final     Cardiac Studies  TTE 12/15/2020 1. Left ventricular ejection fraction, by estimation, is 35 to 40%. The  left ventricle has moderately decreased function. The left ventricle  demonstrates global hypokinesis. Left ventricular diastolic parameters are  consistent with Grade I diastolic  dysfunction (impaired relaxation).  2. Right ventricular systolic function is normal. The right ventricular  size is normal.  3. The aortic valve was not well visualized.  4. Limited echocardiogram   Patient Profile  Cybil Pettifordis a 40 y.o.femalewith  diabetes, morbid obesity, HIV who was admitted on 12/12/2020 for new onset systolic heart failure.  Assessment & Plan   1.  New onset systolic heart failure, EF 35 to 40% -Limited echo yesterday with contrast confirms there is global hypokinesis.  I suspect this is a nonischemic cardiomyopathy in setting of uncontrolled hypertension, diabetes and HIV. -Creatinine slightly elevated today.  Hospital medicine to get back some fluids.  Entresto on hold. -As long as kidney function is going back down she can just go out on Coreg 12.5 twice a day, Aldactone 12.5 daily, Jardiance 10 mg daily, Entresto-24-26 mg twice daily.  She has an appointment to see me next week in the office. -I have plans to treat her medically.  EKG without ischemic changes.  Troponins negative.  This all points to a nonischemic cardiomyopathy.  CT PE study negative for coronary calcium.  I will plan to touch base with hospital medicine tomorrow regarding her discharge.  [facility] will  sign off.   Medication Recommendations: Medications as above.  Restart Entresto tomorrow. Other recommendations (labs, testing, etc): None Follow up as an outpatient: She already has follow-up with me next week.  For questions or updates, please contact [facility] Please consult [email or web address] for contact info under   Time Spent with Patient: I have spent a total of 25 minutes with patient reviewing hospital notes, telemetry, EKGs, labs and examining the patient as well as establishing an assessment and plan that was discussed with the patient.  > 50% of time was spent in direct patient care.    Signed, Lenna Gilford. Flora Lipps, MD [facility]  [facility]  12/16/2020 11:25 AM

## 2020-12-16 NOTE — Progress Notes (Addendum)
Inpatient Diabetes Program Recommendations  AACE/ADA: New Consensus Statement on Inpatient Glycemic Control (2015)  Target Ranges:  Prepandial:   less than 140 mg/dL      Peak postprandial:   less than 180 mg/dL (1-2 hours)      Critically ill patients:  140 - 180 mg/dL   Lab Results  Component Value Date   GLUCAP 128 (H) 12/16/2020   HGBA1C 9.8 (H) 12/11/2020    Review of Glycemic Control Results for Sherry Hall, Sherry Hall (MRN [id number]) as of 12/16/2020 11:49  Ref. Range 12/15/2020 07:23 12/15/2020 11:53 12/15/2020 15:58 12/15/2020 20:37 12/16/2020 07:30  Glucose-Capillary Latest Ref Range: 70 - 99 mg/dL 885 (H) 027 (H) 741 (H) 174 (H) 128 (H)   Diabetes history: DM2  Outpatient Diabetes medications: Lantus 30 units daily, Humulin R SSI TID, Janumet 50-1000 mg daily   Current orders for Inpatient glycemic control: Lantus 15 units QHS, Novolog 0-15 units TID, Jardiance 10 mg daily  Inpatient diabetes consult for diabetes teaching and insulin teaching.  Attempted to call patient last week several times and today;  not answering phone.  Called Leavy Cella, RN and asked her to let patient know the DM coordinator will be calling and to please answer her phone.  Will continue to contact patient.  [email or web address] :  Spoke with patient on the phone. She was diagnosed with DM45 at 40 years old.  She confirms above home medications.   She states she does not always remember to take Janumet and is inconsistent with Lantus administration timing; she usually administers in the morning.   Reviewed patient's current A1c of 9.8% (average blood sugar of 235 mg/dL). Explained what a A1c is and what it measures. Also reviewed goal A1c with patient, importance of good glucose control @ home, and blood sugar goals.  She does not eat 3 meals a day.  She states she does not check her blood sugar consistently.  Reviewed insulin administration with meals after checking her blood sugar.  Ordered LWWD booklet for further  education.  She does not drink beverages with sugar and states she has a hard time modifying CHO's.  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 Placed dietician consult as she has further questions about low sodium diet.     Will continue to follow while inpatient.  Thank you, Dulce Sellar, RN, BSN Diabetes Coordinator Inpatient Diabetes Program [phone] (team pager from 8a-5p)

## 2020-12-16 NOTE — Care Management Important Message (Signed)
Important Message  Patient Details  Name: Sherry Hall MRN: [id number] Date of Birth: 07/30/1981   Medicare Important Message Given:  Yes - Important Message mailed due to current National Emergency  Verbal consent obtained due to current National Emergency  Relationship to patient: Self Contact Name: Evea Call Date: 12/16/20  Time: 1144 Phone: [phone] Outcome: Spoke with contact (information/ phone number for [facility] appeals was delivered over the phone as well as mailed to patients address on file) Important Message mailed to: Patient address on file     Orson Aloe 12/16/2020, 11:44 AM

## 2020-12-17 LAB — BASIC METABOLIC PANEL
Anion gap: 8 (ref 5–15)
BUN: 32 mg/dL — ABNORMAL HIGH (ref 6–20)
CO2: 23 mmol/L (ref 22–32)
Calcium: 8.8 mg/dL — ABNORMAL LOW (ref 8.9–10.3)
Chloride: 105 mmol/L (ref 98–111)
Creatinine, Ser: 1.8 mg/dL — ABNORMAL HIGH (ref 0.44–1.00)
GFR, Estimated: 36 mL/min — ABNORMAL LOW (ref >60)
Glucose, Bld: 159 mg/dL — ABNORMAL HIGH (ref 70–99)
Potassium: 4.1 mmol/L (ref 3.5–5.1)
Sodium: 136 mmol/L (ref 135–145)

## 2020-12-17 LAB — GLUCOSE, CAPILLARY
Glucose-Capillary: 133 mg/dL — ABNORMAL HIGH (ref 70–99)
Glucose-Capillary: 160 mg/dL — ABNORMAL HIGH (ref 70–99)
Glucose-Capillary: 93 mg/dL (ref 70–99)
Glucose-Capillary: 94 mg/dL (ref 70–99)

## 2020-12-17 LAB — MAGNESIUM: Magnesium: 2.1 mg/dL (ref 1.7–2.4)

## 2020-12-17 MED ORDER — LACTATED RINGERS IV SOLN: INTRAVENOUS | Status: AC

## 2020-12-17 MED ORDER — CARVEDILOL 3.125 MG PO TABS
3.1250 mg | ORAL_TABLET | Freq: Two times a day (BID) | ORAL | Status: DC
  Administered 2020-12-18: 3.125 mg via ORAL
  Filled 2020-12-17 (×2): qty 1

## 2020-12-17 NOTE — Plan of Care (Signed)
Nutrition Education Note  RD consulted for nutrition education regarding low sodium/DM diet.  RD provided "Low Sodium Nutrition Therapy" and "Counting Carbohydrates for People with Diabetes" handouts from the Academy of [employer] and placed in pt discharge instructions. Reviewed patient's dietary recall. Provided examples on ways to decrease sodium intake in diet. Discouraged intake of processed foods and use of salt shaker. Encouraged fresh fruits and vegetables as well as whole grain sources of carbohydrates to maximize fiber intake.   RD discussed why it is important for patient to adhere to diet recommendations, and emphasized the role of fluids and foods to avoid. Discussed diabetic friendly drink options. Teach back method used.  Expect good compliance.  Current diet order is heart healthy/carbohydrate modified, patient is consuming approximately 100% of meals at this time. Pt reports having a good appetite currently and PTA with no difficulties. Labs and medications reviewed. No further nutrition interventions warranted at this time. RD contact information provided. If additional nutrition issues arise, please re-consult RD.   Sherry Smiling, MS, RD, LDN RD pager number/after hours weekend pager number on Amion.

## 2020-12-18 ENCOUNTER — Other Ambulatory Visit: Payer: Self-pay | Admitting: Internal Medicine

## 2020-12-18 DIAGNOSIS — B20 Human immunodeficiency virus [HIV] disease: Secondary | ICD-10-CM | POA: Diagnosis not present

## 2020-12-18 LAB — BASIC METABOLIC PANEL
Anion gap: 8 (ref 5–15)
BUN: 24 mg/dL — ABNORMAL HIGH (ref 6–20)
CO2: 21 mmol/L — ABNORMAL LOW (ref 22–32)
Calcium: 9.1 mg/dL (ref 8.9–10.3)
Chloride: 106 mmol/L (ref 98–111)
Creatinine, Ser: 1.41 mg/dL — ABNORMAL HIGH (ref 0.44–1.00)
GFR, Estimated: 48 mL/min — ABNORMAL LOW (ref >60)
Glucose, Bld: 95 mg/dL (ref 70–99)
Potassium: 4.2 mmol/L (ref 3.5–5.1)
Sodium: 135 mmol/L (ref 135–145)

## 2020-12-18 LAB — GLUCOSE, CAPILLARY: Glucose-Capillary: 92 mg/dL (ref 70–99)

## 2020-12-18 LAB — MAGNESIUM: Magnesium: 2.3 mg/dL (ref 1.7–2.4)

## 2020-12-18 MED ORDER — LISINOPRIL 5 MG PO TABS: 5.0000 mg | ORAL_TABLET | Freq: Every day | ORAL | 0 refills | Status: DC

## 2020-12-18 MED ORDER — ATORVASTATIN CALCIUM 10 MG PO TABS: 10.0000 mg | ORAL_TABLET | Freq: Every day | ORAL | 0 refills | Status: DC

## 2020-12-18 MED ORDER — CARVEDILOL 3.125 MG PO TABS: 3.1250 mg | ORAL_TABLET | Freq: Two times a day (BID) | ORAL | 0 refills | Status: DC

## 2020-12-18 MED ORDER — FUROSEMIDE 40 MG PO TABS: 20.0000 mg | ORAL_TABLET | Freq: Every day | ORAL | 0 refills | Status: DC

## 2020-12-18 MED ORDER — SPIRONOLACTONE 25 MG PO TABS: 12.5000 mg | ORAL_TABLET | Freq: Every day | ORAL | 0 refills | Status: DC

## 2020-12-18 MED ORDER — BIKTARVY 50-200-25 MG PO TABS: 1.0000 | ORAL_TABLET | Freq: Every day | ORAL | 0 refills | Status: DC

## 2020-12-18 MED ORDER — EMPAGLIFLOZIN 10 MG PO TABS
10.0000 mg | ORAL_TABLET | Freq: Every day | ORAL | 0 refills | Status: DC
Start: 2020-12-19 — End: 2020-12-23

## 2020-12-18 MED FILL — JARDIANCE 10 MG TABLET: 10 | 30 days supply | Qty: 30 | Fill #0

## 2020-12-18 MED FILL — CARVEDILOL 3.125 MG TABLET: 3.125 | 30 days supply | Qty: 60 | Fill #0

## 2020-12-18 MED FILL — SPIRONOLACTONE 25 MG TABLET: 25 | 30 days supply | Qty: 15 | Fill #0

## 2020-12-18 MED FILL — LISINOPRIL 5 MG TABS: 5 | 30 days supply | Qty: 30 | Fill #0

## 2020-12-18 MED FILL — FUROSEMIDE 40 MG TABLET: 40 | 30 days supply | Qty: 15 | Fill #0

## 2020-12-18 MED FILL — ATORVASTATIN CALCIUM 10 MG: 10 | 30 days supply | Qty: 30 | Fill #0

## 2020-12-18 NOTE — Discharge Instructions (Signed)
Follow with Primary MD Massie Maroon, FNP in 7 days   Get CBC, CMP, 2 view Chest X ray -  checked next visit within 1 week by Primary MD   Activity: As tolerated with Full fall precautions use walker/cane & assistance as needed  Disposition Home   Diet: Heart Healthy low carbohydrate diet with 1.5 L fluid restriction per day.   Check your Weight same time everyday, if you gain over 2 pounds, or you develop in leg swelling, experience more shortness of breath or chest pain, call your Primary MD immediately. Follow Cardiac Low Salt Diet and 1.5 lit/day fluid restriction.  Accuchecks 4 times/day, Once in AM empty stomach and then before each meal. Log in all results and show them to your Prim.MD in 3 days. If any glucose reading is under 80 or above 300 call your Prim MD immidiately. Follow Low glucose instructions for glucose under 80 as instructed.  Special Instructions: If you have smoked or chewed Tobacco  in the last 2 yrs please stop smoking, stop any regular Alcohol  and or any Recreational drug use.  On your next visit with your primary care physician please Get Medicines reviewed and adjusted.  Please request your Prim.MD to go over all Hospital Tests and Procedure/Radiological results at the follow up, please get all Hospital records sent to your Prim MD by signing hospital release before you go home.  If you experience worsening of your admission symptoms, develop shortness of breath, life threatening emergency, suicidal or homicidal thoughts you must seek medical attention immediately by calling 911 or calling your MD immediately  if symptoms less severe.  You Must read complete instructions/literature along with all the possible adverse reactions/side effects for all the Medicines you take and that have been prescribed to you. Take any new Medicines after you have completely understood and accpet all the possible adverse reactions/side effects.          Person Under  Monitoring Name: Sherry Hall  Location: [address] [place] [address]   Infection Prevention Recommendations for Individuals Confirmed to have, or Being Evaluated for, 2019 Novel Coronavirus (COVID-19) Infection Who Receive Care at Home  Individuals who are confirmed to have, or are being evaluated for, COVID-19 should follow the prevention steps below until a healthcare provider or local or state health department says they can return to normal activities.  Stay home except to get medical care You should restrict activities outside your home, except for getting medical care. Do not go to work, school, or public areas, and do not use public transportation or taxis.  Call ahead before visiting your doctor Before your medical appointment, call the healthcare provider and tell them that you have, or are being evaluated for, COVID-19 infection. This will help the healthcare provider's office take steps to keep other people from getting infected. Ask your healthcare provider to call the local or state health department.  Monitor your symptoms Seek prompt medical attention if your illness is worsening (e.g., difficulty breathing). Before going to your medical appointment, call the healthcare provider and tell them that you have, or are being evaluated for, COVID-19 infection. Ask your healthcare provider to call the local or state health department.  Wear a facemask You should wear a facemask that covers your nose and mouth when you are in the same room with other people and when you visit a healthcare provider. People who live with or visit you should also wear a facemask while they are  in the same room with you.  Separate yourself from other people in your home As much as possible, you should stay in a different room from other people in your home. Also, you should use a separate bathroom, if available.  Avoid sharing household items You should not share dishes,  drinking glasses, cups, eating utensils, towels, bedding, or other items with other people in your home. After using these items, you should wash them thoroughly with soap and water.  Cover your coughs and sneezes Cover your mouth and nose with a tissue when you cough or sneeze, or you can cough or sneeze into your sleeve. Throw used tissues in a lined trash can, and immediately wash your hands with soap and water for at least 20 seconds or use an alcohol-based hand rub.  Wash your [employer] your hands often and thoroughly with soap and water for at least 20 seconds. You can use an alcohol-based hand sanitizer if soap and water are not available and if your hands are not visibly dirty. Avoid touching your eyes, nose, and mouth with unwashed hands.   Prevention Steps for Caregivers and Household Members of Individuals Confirmed to have, or Being Evaluated for, COVID-19 Infection Being Cared for in the Home  If you live with, or provide care at home for, a person confirmed to have, or being evaluated for, COVID-19 infection please follow these guidelines to prevent infection:  Follow healthcare provider's instructions Make sure that you understand and can help the patient follow any healthcare provider instructions for all care.  Provide for the patient's basic needs You should help the patient with basic needs in the home and provide support for getting groceries, prescriptions, and other personal needs.  Monitor the patient's symptoms If they are getting sicker, call his or her medical provider and tell them that the patient has, or is being evaluated for, COVID-19 infection. This will help the healthcare provider's office take steps to keep other people from getting infected. Ask the healthcare provider to call the local or state health department.  Limit the number of people who have contact with the patient  If possible, have only one caregiver for the patient.  Other  household members should stay in another home or place of residence. If this is not possible, they should stay  in another room, or be separated from the patient as much as possible. Use a separate bathroom, if available.  Restrict visitors who do not have an essential need to be in the home.  Keep older adults, very young children, and other sick people away from the patient Keep older adults, very young children, and those who have compromised immune systems or chronic health conditions away from the patient. This includes people with chronic heart, lung, or kidney conditions, diabetes, and cancer.  Ensure good ventilation Make sure that shared spaces in the home have good air flow, such as from an air conditioner or an opened window, weather permitting.  Wash your hands often  Wash your hands often and thoroughly with soap and water for at least 20 seconds. You can use an alcohol based hand sanitizer if soap and water are not available and if your hands are not visibly dirty.  Avoid touching your eyes, nose, and mouth with unwashed hands.  Use disposable paper towels to dry your hands. If not available, use dedicated cloth towels and replace them when they become wet.  Wear a facemask and gloves  Wear a disposable facemask at all  times in the room and gloves when you touch or have contact with the patient's blood, body fluids, and/or secretions or excretions, such as sweat, saliva, sputum, nasal mucus, vomit, urine, or feces.  Ensure the mask fits over your nose and mouth tightly, and do not touch it during use.  Throw out disposable facemasks and gloves after using them. Do not reuse.  Wash your hands immediately after removing your facemask and gloves.  If your personal clothing becomes contaminated, carefully remove clothing and launder. Wash your hands after handling contaminated clothing.  Place all used disposable facemasks, gloves, and other waste in a lined container before  disposing them with other household waste.  Remove gloves and wash your hands immediately after handling these items.  Do not share dishes, glasses, or other household items with the patient  Avoid sharing household items. You should not share dishes, drinking glasses, cups, eating utensils, towels, bedding, or other items with a patient who is confirmed to have, or being evaluated for, COVID-19 infection.  After the person uses these items, you should wash them thoroughly with soap and water.  Wash laundry thoroughly  Immediately remove and wash clothes or bedding that have blood, body fluids, and/or secretions or excretions, such as sweat, saliva, sputum, nasal mucus, vomit, urine, or feces, on them.  Wear gloves when handling laundry from the patient.  Read and follow directions on labels of laundry or clothing items and detergent. In general, wash and dry with the warmest temperatures recommended on the label.  Clean all areas the individual has used often  Clean all touchable surfaces, such as counters, tabletops, doorknobs, bathroom fixtures, toilets, phones, keyboards, tablets, and bedside tables, every day. Also, clean any surfaces that may have blood, body fluids, and/or secretions or excretions on them.  Wear gloves when cleaning surfaces the patient has come in contact with.  Use a diluted bleach solution (e.g., dilute bleach with 1 part bleach and 10 parts water) or a household disinfectant with a label that says EPA-registered for coronaviruses. To make a bleach solution at home, add 1 tablespoon of bleach to 1 quart (4 cups) of water. For a larger supply, add  cup of bleach to 1 gallon (16 cups) of water.  Read labels of cleaning products and follow recommendations provided on product labels. Labels contain instructions for safe and effective use of the cleaning product including precautions you should take when applying the product, such as wearing gloves or eye protection  and making sure you have good ventilation during use of the product.  Remove gloves and wash hands immediately after cleaning.  Monitor yourself for signs and symptoms of illness Caregivers and household members are considered close contacts, should monitor their health, and will be asked to limit movement outside of the home to the extent possible. Follow the monitoring steps for close contacts listed on the symptom monitoring form.   ? If you have additional questions, contact your local health department or call the epidemiologist on call at [phone] (available 24/7). ? This guidance is subject to change. For the most up-to-date guidance from [facility], please refer to their website: [email or web address].hu           Carbohydrate Counting For People With Diabetes  Foods with carbohydrates make your blood glucose level go up. Learning how to count carbohydrates can help you control your blood glucose levels. First, identify the foods you eat that contain carbohydrates. Then, using the Foods with Carbohydrates chart, determine about how much carbohydrates  are in your meals and snacks. Make sure you are eating foods with fiber, protein, and healthy fat along with your carbohydrate foods. Foods with Carbohydrates The following table shows carbohydrate foods that have about 15 grams of carbohydrate each. Using measuring cups, spoons, or a food scale when you first begin learning about carbohydrate counting can help you learn about the portion sizes you typically eat. The following foods have 15 grams carbohydrate each:  Grains . 1 slice bread (1 ounce)  . 1 small tortilla (6-inch size)  .  large bagel (1 ounce)  . 1/3 cup pasta or rice (cooked)  .  hamburger or hot dog bun ( ounce)  .  cup cooked cereal  .  to  cup ready-to-eat cereal  . 2 taco shells (5-inch size) Fruit . 1 small fresh fruit ( to 1 cup)  .  medium banana   . 17 small grapes (3 ounces)  . 1 cup melon or berries  .  cup canned or frozen fruit  . 2 tablespoons dried fruit (blueberries, cherries, cranberries, raisins)  .  cup unsweetened fruit juice  Starchy Vegetables .  cup cooked beans, peas, corn, potatoes/sweet potatoes  .  large baked potato (3 ounces)  . 1 cup acorn or butternut squash  Snack Foods . 3 to 6 crackers  . 8 potato chips or 13 tortilla chips ( ounce to 1 ounce)  . 3 cups popped popcorn  Dairy . 3/4 cup (6 ounces) nonfat plain yogurt, or yogurt with sugar-free sweetener  . 1 cup milk  . 1 cup plain rice, soy, coconut or flavored almond milk Sweets and Desserts .  cup ice cream or frozen yogurt  . 1 tablespoon jam, jelly, pancake syrup, table sugar, or honey  . 2 tablespoons light pancake syrup  . 1 inch square of frosted cake or 2 inch square of unfrosted cake  . 2 small cookies (2/3 ounce each) or  large cookie  Sometimes you'll have to estimate carbohydrate amounts if you don't know the exact recipe. One cup of mixed foods like soups can have 1 to 2 carbohydrate servings, while some casseroles might have 2 or more servings of carbohydrate. Foods that have less than 20 calories in each serving can be counted as "free" foods. Count 1 cup raw vegetables, or  cup cooked non-starchy vegetables as "free" foods. If you eat 3 or more servings at one meal, then count them as 1 carbohydrate serving.  Foods without Carbohydrates  Not all foods contain carbohydrates. Meat, some dairy, fats, non-starchy vegetables, and many beverages don't contain carbohydrate. So when you count carbohydrates, you can generally exclude chicken, pork, beef, fish, seafood, eggs, tofu, cheese, butter, sour cream, avocado, nuts, seeds, olives, mayonnaise, water, black coffee, unsweetened tea, and zero-calorie drinks. Vegetables with no or low carbohydrate include green beans, cauliflower, tomatoes, and onions. How much carbohydrate should I eat at  each meal?  Carbohydrate counting can help you plan your meals and manage your weight. Following are some starting points for carbohydrate intake at each meal. Work with your registered dietitian nutritionist to find the best range that works for your blood glucose and weight.   To Lose Weight To Maintain Weight  Women 2 - 3 carb servings 3 - 4 carb servings  Men 3 - 4 carb servings 4 - 5 carb servings  Checking your blood glucose after meals will help you know if you need to adjust the timing, type, or number of carbohydrate servings  in your meal plan. Achieve and keep a healthy body weight by balancing your food intake and physical activity.  Tips How should I plan my meals?  Plan for half the food on your plate to include non-starchy vegetables, like salad greens, broccoli, or carrots. Try to eat 3 to 5 servings of non-starchy vegetables every day. Have a protein food at each meal. Protein foods include chicken, fish, meat, eggs, or beans (note that beans contain carbohydrate). These two food groups (non-starchy vegetables and proteins) are low in carbohydrate. If you fill up your plate with these foods, you will eat less carbohydrate but still fill up your stomach. Try to limit your carbohydrate portion to  of the plate.  What fats are healthiest to eat?  Diabetes increases risk for heart disease. To help protect your heart, eat more healthy fats, such as olive oil, nuts, and avocado. Eat less saturated fats like butter, cream, and high-fat meats, like bacon and sausage. Avoid trans fats, which are in all foods that list "partially hydrogenated oil" as an ingredient. What should I drink?  Choose drinks that are not sweetened with sugar. The healthiest choices are water, carbonated or seltzer waters, and tea and coffee without added sugars.  Sweet drinks will make your blood glucose go up very quickly. One serving of soda or energy drink is  cup. It is best to drink these beverages only if your  blood glucose is low.  Artificially sweetened, or diet drinks, typically do not increase your blood glucose if they have zero calories in them. Read labels of beverages, as some diet drinks do have carbohydrate and will raise your blood glucose. Label Reading Tips Read Nutrition Facts labels to find out how many grams of carbohydrate are in a food you want to eat. Don't forget: sometimes serving sizes on the label aren't the same as how much food you are going to eat, so you may need to calculate how much carbohydrate is in the food you are serving yourself.   Carbohydrate Counting for People with Diabetes Sample 1-Day Menu  Breakfast  cup yogurt, low fat, low sugar (1 carbohydrate serving)   cup cereal, ready-to-eat, unsweetened (1 carbohydrate serving)  1 cup strawberries (1 carbohydrate serving)   cup almonds ( carbohydrate serving)  Lunch 1, 5 ounce can chunk light tuna  2 ounces cheese, low fat cheddar  6 whole wheat crackers (1 carbohydrate serving)  1 small apple (1 carbohydrate servings)   cup carrots ( carbohydrate serving)   cup snap peas  1 cup 1% milk (1 carbohydrate serving)   Evening Meal Stir fry made with: 3 ounces chicken  1 cup brown rice (3 carbohydrate servings)   cup broccoli ( carbohydrate serving)   cup green beans   cup onions  1 tablespoon olive oil  2 tablespoons teriyaki sauce ( carbohydrate serving)  Evening Snack 1 extra small banana (1 carbohydrate serving)  1 tablespoon peanut butter   Carbohydrate Counting for People with Diabetes Vegan Sample 1-Day Menu  Breakfast 1 cup cooked oatmeal (2 carbohydrate servings)   cup blueberries (1 carbohydrate serving)  2 tablespoons flaxseeds  1 cup soymilk fortified with calcium and vitamin D  1 cup coffee  Lunch 2 slices whole wheat bread (2 carbohydrate servings)   cup baked tofu   cup lettuce  2 slices tomato  2 slices avocado   cup baby carrots ( carbohydrate serving)  1 orange (1  carbohydrate serving)  1 cup soymilk fortified with calcium  and vitamin D   Evening Meal Burrito made with: 1 6-inch corn tortilla (1 carbohydrate serving)  1 cup refried vegetarian beans (2 carbohydrate servings)   cup chopped tomatoes   cup lettuce   cup salsa  1/3 cup brown rice (1 carbohydrate serving)  1 tablespoon olive oil for rice   cup zucchini   Evening Snack 6 small whole grain crackers (1 carbohydrate serving)  2 apricots ( carbohydrate serving)   cup unsalted peanuts ( carbohydrate serving)    Carbohydrate Counting for People with Diabetes Vegetarian (Lacto-Ovo) Sample 1-Day Menu  Breakfast 1 cup cooked oatmeal (2 carbohydrate servings)   cup blueberries (1 carbohydrate serving)  2 tablespoons flaxseeds  1 egg  1 cup 1% milk (1 carbohydrate serving)  1 cup coffee  Lunch 2 slices whole wheat bread (2 carbohydrate servings)  2 ounces low-fat cheese   cup lettuce  2 slices tomato  2 slices avocado   cup baby carrots ( carbohydrate serving)  1 orange (1 carbohydrate serving)  1 cup unsweetened tea  Evening Meal Burrito made with: 1 6-inch corn tortilla (1 carbohydrate serving)   cup refried vegetarian beans (1 carbohydrate serving)   cup tomatoes   cup lettuce   cup salsa  1/3 cup brown rice (1 carbohydrate serving)  1 tablespoon olive oil for rice   cup zucchini  1 cup 1% milk (1 carbohydrate serving)  Evening Snack 6 small whole grain crackers (1 carbohydrate serving)  2 apricots ( carbohydrate serving)   cup unsalted peanuts ( carbohydrate serving)    Copyright 2020  Academy of [employer]. All rights reserved.  Using Nutrition Labels: Carbohydrate  . Serving Size  . Look at the serving size. All the information on the label is based on this portion. Sherry Hall. Servings Per Container  . The number of servings contained in the package. . Guidelines for Carbohydrate  . Look at the total grams of carbohydrate in the serving size.   . 1 carbohydrate choice = 15 grams of carbohydrate. Range of Carbohydrate Grams Per Choice  Carbohydrate Grams/Choice Carbohydrate Choices  6-10   11-20 1  21-25 1  26-35 2  36-40 2  41-50 3  51-55 3  56-65 4  66-70 4  71-80 5    Copyright 2020  Academy of [employer]etics. All rights reserved.  Low Sodium Nutrition Therapy  Eating less sodium can help you if you have high blood pressure, heart failure, or kidney or liver disease.   Your body needs a little sodium, but too much sodium can cause your body to hold onto extra water. This extra water will raise your blood pressure and can cause damage to your heart, kidneys, or liver as they are forced to work harder.   Sometimes you can see how the extra fluid affects you because your hands, legs, or belly swell. You may also hold water around your heart and lungs, which makes it hard to breathe.   Even if you take medication for blood pressure or a water pill (diuretic) to remove fluid, it is still important to have less salt in your diet.   Check with your primary care provider before drinking alcohol since it may affect the amount of fluid in your body and how your heart, kidneys, or liver work. Sodium in Food A low-sodium meal plan limits the sodium that you get from food and beverages to 1,500-2,000 milligrams (mg) per day. Salt is the main source of sodium. Read the  nutrition label on the package to find out how much sodium is in one serving of a food.  . Select foods with 140 milligrams (mg) of sodium or less per serving.  . You may be able to eat one or two servings of foods with a little more than 140 milligrams (mg) of sodium if you are closely watching how much sodium you eat in a day.  . Check the serving size on the label. The amount of sodium listed on the label shows the amount in one serving of the food. So, if you eat more than one serving, you will get more sodium than the amount listed.  Tips Cutting  Back on Sodium . Eat more fresh foods.  . Fresh fruits and vegetables are low in sodium, as well as frozen vegetables and fruits that have no added juices or sauces.  . Fresh meats are lower in sodium than processed meats, such as bacon, sausage, and hotdogs.  . Not all processed foods are unhealthy, but some processed foods may have too much sodium.  . Eat less salt at the table and when cooking. One of the ingredients in salt is sodium.  . One teaspoon of table salt has 2,300 milligrams of sodium.  . Leave the salt out of recipes for pasta, casseroles, and soups. . Be a smart shopper.  . Food packages that say "Salt-free", sodium-free", "very low sodium," and "low sodium" have less than 140 milligrams of sodium per serving.  . Beware of products identified as "Unsalted," "No Salt Added," "Reduced Sodium," or "Lower Sodium." These items may still be high in sodium. You should always check the nutrition label. . Add flavors to your food without adding sodium.  . Try lemon juice, lime juice, or vinegar.  . Dry or fresh herbs add flavor.  Arnoldo Morale a sodium-free seasoning blend or make your own at home. . You can purchase salt-free or sodium-free condiments like barbeque sauce in stores and online. Ask your registered dietitian nutritionist for recommendations and where to find them.  .  Eating in Restaurants . Choose foods carefully when you eat outside your home. Restaurant foods can be very high in sodium. Many restaurants provide nutrition facts on their menus or their websites. If you cannot find that information, ask your server. Let your server know that you want your food to be cooked without salt and that you would like your salad dressing and sauces to be served on the side.  .   . Foods Recommended . Food Group . Foods Recommended  . Grains . Bread, bagels, rolls without salted tops Homemade bread made with reduced-sodium baking powder Cold cereals, especially shredded wheat and puffed  rice Oats, grits, or cream of wheat Pastas, quinoa, and rice Popcorn, pretzels or crackers without salt Corn tortillas  . Protein Foods . Fresh meats and fish; [place] bacon (check the nutrition labels - make sure they are not packaged in a sodium solution) Canned or packed tuna (no more than 4 ounces at 1 serving) Beans and peas Soybeans) and tofu Eggs Nuts or nut butters without salt  . Dairy . Milk or milk powder Plant milks, such as rice and soy Yogurt, including Greek yogurt Small amounts of natural cheese (blocks of cheese) or reduced-sodium cheese can be used in moderation. (Swiss, ricotta, and fresh mozzarella cheese are lower in sodium than the others) Cream Cheese Low sodium cottage cheese  . Vegetables . Fresh and frozen vegetables without added sauces or salt  Homemade soups (without salt) Low-sodium, salt-free or sodium-free canned vegetables and soups  . Fruit . Fresh and canned fruits Dried fruits, such as raisins, cranberries, and prunes  . Oils . Tub or liquid margarine, regular or without salt [place], corn, peanut, olive, safflower, or sunflower oils  . Condiments . Fresh or dried herbs such as basil, bay leaf, dill, mustard (dry), nutmeg, paprika, parsley, rosemary, sage, or thyme.  Low sodium ketchup Vinegar  Lemon or lime juice Pepper, red pepper flakes, and cayenne. Hot sauce contains sodium, but if you use just a drop or two, it will not add up to much.  Salt-free or sodium-free seasoning mixes and marinades Simple salad dressings: vinegar and oil  .  Marland Kitchen Foods Not Recommended . Food Group . Foods Not Recommended  . Grains . Breads or crackers topped with salt Cereals (hot/cold) with more than 300 mg sodium per serving Biscuits, cornbread, and other "quick" breads prepared with baking soda Pre-packaged bread crumbs Seasoned and packaged rice and pasta mixes Self-rising flours  . Protein Foods . Cured meats: Bacon, ham, sausage, pepperoni and hot  dogs Canned meats (chili, vienna sausage, or sardines) Smoked fish and meats Frozen meals that have more than 600 mg of sodium per serving Egg substitute (with added sodium)  . Dairy . Buttermilk Processed cheese spreads Cottage cheese (1 cup may have over 500 mg of sodium; look for low-sodium.) American or feta cheese Shredded Cheese has more sodium than blocks of cheese String cheese  . Vegetables . Canned vegetables (unless they are salt-free, sodium-free or low sodium) Frozen vegetables with seasoning and sauces Sauerkraut and pickled vegetables Canned or dried soups (unless they are salt-free, sodium-free, or low sodium) [place] fries and onion rings  . Fruit . Dried fruits preserved with additives that have sodium  . Oils . Salted butter or margarine, all types of olives  . Condiments . Salt, sea salt, kosher salt, onion salt, and garlic salt Seasoning mixes with salt Bouillon cubes Ketchup Barbeque sauce and Worcestershire sauce unless low sodium Soy sauce Salsa, pickles, olives, relish Salad dressings: ranch, blue cheese, [place], and [place].  .  . Low Sodium Sample 1-Day Menu  . Breakfast . 1 cup cooked oatmeal  . 1 slice whole wheat bread toast  . 1 tablespoon peanut butter without salt  . 1 banana  . 1 cup 1% milk  . Lunch . Tacos made with: 2 corn tortillas  .  cup black beans, low sodium  .  cup roasted or grilled chicken (without skin)  .  avocado  . Squeeze of lime juice  . 1 cup salad greens  . 1 tablespoon low-sodium salad dressing  .  cup strawberries  . 1 orange  . Afternoon Snack . 1/3 cup grapes  . 6 ounces yogurt  . Evening Meal . 3 ounces herb-baked fish  . 1 baked potato  . 2 teaspoons olive oil  .  cup cooked carrots  . 2 thick slices tomatoes on:  . 2 lettuce leaves  . 1 teaspoon olive oil  . 1 teaspoon balsamic vinegar  . 1 cup 1% milk  . Evening Snack . 1 apple  .  cup almonds without salt  .  Marland Kitchen Low-Sodium Vegetarian (Lacto-Ovo)  Sample 1-Day Menu  . Breakfast . 1 cup cooked oatmeal  . 1 slice whole wheat toast  . 1 tablespoon peanut butter without salt  . 1 banana  . 1 cup 1% milk  . Lunch . Tacos  made with: 2 corn tortillas  .  cup black beans, low sodium  .  cup roasted or grilled chicken (without skin)  .  avocado  . Squeeze of lime juice  . 1 cup salad greens  . 1 tablespoon low-sodium salad dressing  .  cup strawberries  . 1 orange  . Evening Meal . Stir fry made with:  cup tofu  . 1 cup brown rice  .  cup broccoli  .  cup green beans  .  cup peppers  .  tablespoon peanut oil  . 1 orange  . 1 cup 1% milk  . Evening Snack . 4 strips celery  . 2 tablespoons hummus  . 1 hard-boiled egg  .  Marland Kitchen Low-Sodium Vegan Sample 1-Day Menu  . Breakfast . 1 cup cooked oatmeal  . 1 tablespoon peanut butter without salt  . 1 cup blueberries  . 1 cup soymilk fortified with calcium, vitamin B12, and vitamin D  . Lunch . 1 small whole wheat pita  .  cup cooked lentils  . 2 tablespoons hummus  . 4 carrot sticks  . 1 medium apple  . 1 cup soymilk fortified with calcium, vitamin B12, and vitamin D  . Evening Meal . Stir fry made with:  cup tofu  . 1 cup brown rice  .  cup broccoli  .  cup green beans  .  cup peppers  .  tablespoon peanut oil  . 1 cup cantaloupe  . Evening Snack . 1 cup soy yogurt  .  cup mixed nuts  . Copyright 2020  [employer]y [employer]. All rights reserved .  Marland Kitchen Sodium Free Flavoring Tips .  Marland Kitchen When cooking, the following items may be used for flavoring instead of salt or seasonings that contain sodium. . Remember: A little bit of spice goes a long way! Be careful not to overseason. Marland Kitchen Spice Blend Recipe (makes about ? cup) . 5 teaspoons onion powder  . 2 teaspoons garlic powder  . 2 teaspoons paprika  . 2 teaspoon dry mustard  . 1 teaspoon crushed thyme leaves  .  teaspoon white pepper  .  teaspoon celery seed Food Item Flavorings  Beef Basil, bay  leaf, caraway, curry, dill, dry mustard, garlic, grape jelly, green pepper, mace, marjoram, mushrooms (fresh), nutmeg, onion or onion powder, parsley, pepper, rosemary, sage  Chicken Basil, cloves, cranberries, mace, mushrooms (fresh), nutmeg, oregano, paprika, parsley, pineapple, saffron, sage, savory, tarragon, thyme, tomato, turmeric  Egg Chervil, curry, dill, dry mustard, garlic or garlic powder, green pepper, jelly, mushrooms (fresh), nutmeg, onion powder, paprika, parsley, rosemary, tarragon, tomato  Fish Basil, bay leaf, chervil, curry, dill, dry mustard, green pepper, lemon juice, marjoram, mushrooms (fresh), paprika, pepper, tarragon, tomato, turmeric  Lamb Cloves, curry, dill, garlic or garlic powder, mace, mint, mint jelly, onion, oregano, parsley, pineapple, rosemary, tarragon, thyme  Pork Applesauce, basil, caraway, chives, cloves, garlic or garlic powder, onion or onion powder, rosemary, thyme  Veal Apricots, basil, bay leaf, currant jelly, curry, ginger, marjoram, mushrooms (fresh), oregano, paprika  Vegetables Basil, dill, garlic or garlic powder, ginger, lemon juice, mace, marjoram, nutmeg, onion or onion powder, tarragon, tomato, sugar or sugar substitute, salt-free salad dressing, vinegar  Desserts Allspice, anise, cinnamon, cloves, ginger, mace, nutmeg, vanilla extract, other extracts   Copyright 2020  Academy [employer]. All rights reserved  Fluid Restricted Nutrition Therapy  You have been prescribed this diet because your condition affects how much fluid you can  eat or drink. If your heart, liver, or kidneys aren't working properly, you may not be able to effectively eliminate fluids from the body and this may cause swelling (edema) in the legs, arms, and/or stomach. Drink no more than ____1.5_____ liters or ____50____ ounces or ___6.5_____cups of fluid per day.  . You don't need to stop eating or drinking the same fluids you normally would, but you may need to  eat or drink less than usual.  . Your registered dietitian nutritionist will help you determine the correct amount of fluid to consume during the day Breakfast Include fluids taken with medications  Lunch Include fluids taken with medications  Dinner Include fluids taken with medications  Bedtime Snack Include fluids taken with medications     Tips What Are Fluids?  A fluid is anything that is liquid or anything that would melt if left at room temperature. You will need to count these foods and liquids--including any liquid used to take medication--as part of your daily fluid intake. Some examples are: . Alcohol (drink only with your doctor's permission)  . Coffee, tea, and other hot beverages  . Gelatin (Jell-O)  . Gravy  . Ice cream, sherbet, sorbet  . Ice cubes, ice chips  . Milk, liquid creamer  . Nutritional supplements  . Popsicles  . Vegetable and fruit juices; fluid in canned fruit  . Watermelon  . Yogurt  . Soft drinks, lemonade, limeade  . Soups  . Syrup How Do I Measure My Fluid Intake? Marland Kitchen Record your fluid intake daily.  . Tip: Every day, each time you eat or drink fluids, pour water in the same amount into an empty container that can hold the same amount of fluids you are allowed daily. This may help you keep track of how much fluid you are taking in throughout the day.  . To accurately keep track of how much liquid you take in, measure the size of the cups, glasses, and bowls you use. If you eat soup, measure how much of it is liquid and how much is solid (such as noodles, vegetables, meat). Conversions for Measuring Fluid Intake  Milliliters (mL) Liters (L) Ounces (oz) Cups (c)  1000 1 32 4  1200 1.2 40 5  1500 1.5 50 6 1/4  1800 1.8 60 7 1/2  2000 2 67 8 1/3  Tips to Reduce Your Thirst . Chew gum or suck on hard candy.  . Rinse or gargle with mouthwash. Do not swallow.  . Ice chips or popsicles my help quench thirst, but this too needs to be calculated into  the total restriction. Melt ice chips or cubes first to figure out how much fluid they produce (for example, experiment with melting  cup ice chips or 2 ice cubes).  . Add a lemon wedge to your water.  . Limit how much salt you take in. A high salt intake might make you thirstier.  . Don't eat or drink all your allowed liquids at once. Space your liquids out through the day.  . Use small glasses and cups and sip slowly. If allowed, take your medications with fluids you eat or drink during a meal.   Fluid-Restricted Nutrition Therapy Sample 1-Day Menu  Breakfast 1 slice wheat toast  1 tablespoon peanut butter  1/2 cup yogurt (120 milliliters)  1/2 cup blueberries  1 cup milk (240 milliliters)   Lunch 3 ounces sliced [place]  2 slices whole wheat bread  1/2 cup lettuce for sandwich  2 slices tomato for sandwich  1 ounce reduced-fat, reduced-sodium cheese  1/2 cup fresh carrot sticks  1 banana  1 cup unsweetened tea (240 milliliters)   Evening Meal 8 ounces soup (240 milliliters)  3 ounces salmon  1/2 cup quinoa  1 cup green beans  1 cup mixed greens salad  1 tablespoon olive oil  1 cup coffee (240 milliliters)  Evening Snack 1/2 cup sliced peaches  1/2 cup frozen yogurt (120 milliliters)  1 cup water (240 milliliters)  Copyright 2020  Academy [employer]. All rights reserved  Roslyn Smiling, MS, RD, LDN Clinical Dietitian Office phone [phone]

## 2020-12-18 NOTE — Progress Notes (Signed)
Hurley Lesperance to be D/C'd Home per MD order.  Discussed with the patient and all questions fully answered.  VSS, Skin clean, dry and intact without evidence of skin break down, no evidence of skin tears noted. IV catheter discontinued intact. Site without signs and symptoms of complications. Dressing and pressure applied.  An After Visit Summary was printed and given to the patient. Patient received prescription medications.  D/c education completed with patient/family including follow up instructions, medication list, d/c activities limitations if indicated, with other d/c instructions as indicated by MD - patient able to verbalize understanding, all questions fully answered.   Patient instructed to return to ED, call 911, or call MD for any changes in condition.   Patient escorted via WC, and D/C home via private auto.  Velva Harman 12/18/2020 12:18 PM

## 2020-12-18 NOTE — Discharge Summary (Signed)
Sherry Hall TXM:[id number] DOB: November 18, 1980 DOA: 12/11/2020  PCP: Dorena Dew, FNP  Admit date: 12/11/2020  Discharge date: 12/18/2020  Admitted From: Home   Disposition:  Home   Recommendations for Outpatient Follow-up:   Follow up with PCP in 1-2 weeks  PCP Please obtain BMP/CBC, 2 view CXR in 1week,  (see Discharge instructions)   PCP Please follow up on the following pending results: Monitor blood pressure, CBC, CMP and diuretic dose closely.  Needs outpatient cardiology follow-up on a close basis along with ID follow-up for HIV.   Home Health: None   Equipment/Devices: None  Consultations: Cards Discharge Condition: Stable    CODE STATUS: Full    Diet Recommendation: Heart Healthy Low Carb, 1.5 L/day total fluid restriction.  Diet Order            Diet heart healthy/carb modified Room service appropriate? Yes; Fluid consistency: Thin; Fluid restriction: 1500 mL Fluid  Diet effective now                  Chief Complaint  Patient presents with  . Shortness of Breath     Brief history of present illness from the day of admission and additional interim summary    Patient is a 40 y.o. female with PMHx of insulin-dependent DM-2, HTN, HIV (noncompliant to antiretrovirals for almost 8 months)-presented with a 2-week history of orthopnea and dyspnea on exertion -upon further evaluation-she was found to have acute systolic heart failure.  COVID-19 vaccinated status: Unvaccinated  Significant Events: 2/23>> Admit to [facility] for orthopnea/exertional dyspnea  Significant studies: 2/23>>Chest x-ray: Interstitial infiltrates 2/23>> CTA chest: No PE.  Minimal bilateral atelectasis 2/24>> Echo: EF 48-25%, grade 2 diastolic dysfunction, + wall motion abnormalities.  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    AKI.   Much improved, resuming ACE inhibitor on 12/20/2020.  This was due to hypotension and excessive diuresis  HIV: Noncompliant with ART for almost 8 months-CD4 count stable at 570.  ID consulted-started on ART on 2/24. Resuming Biktarvy per ID recommendation, will be discharged on that with 1 week follow-up with PCP and ID outpatient.  COVID-19 infection: Likely incidental finding-no hypoxia-no pneumonia on CT imaging-needs Remdesivir x3 days only.  No role for steroids.  D dimer is borderline elevated and she is morbidly obese have adjusted her to moderate dose Lovenox on 12/14/2020, D-dimer improved, no leg swelling or chest discomfort.  HTN: Blood pressure stable on present regimen.  Depression/anxiety: Claims she lost her 41 year old son in 2017-since that she has had anxiety issues-currently stable-continue trazodone, BuSpar/as needed Xanax-stable for outpatient follow-up with PCP.   Morbid Obesity: BMI 52 follow with PCP for weight loss.  Insulin-dependent M-2 (A1c 9.8 on 2/23):  Poor outpatient control due to hyperglycemia, continue home regimen, added Jardiance due to CHF, requested to do Accu-Cheks before every meal at bedtime and follow with PCP in a week for further adjustment.   Discharge diagnosis     Principal Problem:   HIV disease ([place]) Active Problems:   Dyspnea   COVID-19 virus infection    Discharge instructions    Discharge  Instructions    Discharge instructions   Complete by: As directed    Follow with Primary MD Dorena Dew, FNP in 7 days   Get CBC, CMP, 2 view Chest X ray -  checked next visit within 1 week by Primary MD   Activity: As tolerated with Full fall precautions use walker/cane & assistance as needed  Disposition Home   Diet: Heart Healthy low carbohydrate diet with 1.5 L fluid restriction per day.  Accuchecks 4 times/day, Once in AM empty stomach and then before each meal. Log in all results and show them to your Prim.MD in 3 days. If any glucose reading is under 80 or above 300 call your Prim MD immidiately. Follow Low glucose instructions for glucose under 80 as instructed.  Check your Weight same time everyday, if you gain over 2 pounds, or you develop in leg swelling, experience more shortness of breath or chest pain, call your Primary MD immediately. Follow Cardiac Low Salt Diet and 1.5 lit/day fluid restriction.  Special Instructions: If you have smoked or chewed Tobacco  in the last 2 yrs please stop smoking, stop any regular Alcohol  and or any Recreational drug use.  On your next visit with your primary care physician please Get Medicines reviewed and adjusted.  Please request your Prim.MD to go over all Hospital Tests and Procedure/Radiological results at the follow up, please get all Hospital records sent to your Prim MD by signing hospital release before you go home.  If you experience worsening of your admission symptoms, develop shortness of breath, life threatening emergency, suicidal or homicidal thoughts you must seek medical attention immediately by calling 911 or calling your MD immediately  if symptoms less severe.  You Must read complete instructions/literature along with all the possible adverse reactions/side effects for all the Medicines you take and that have been prescribed to you. Take any new Medicines after you have completely understood and accpet all the  possible adverse reactions/side effects.   Increase activity slowly   Complete by: As directed       Discharge Medications   Allergies as of 12/18/2020      Reactions   Percocet [oxycodone-acetaminophen] Hives, Itching, Rash   Latex Rash      Medication List    STOP taking these medications   Descovy 200-25 MG tablet Generic drug: emtricitabine-tenofovir AF   Janumet 50-1000 MG tablet Generic drug: sitaGLIPtin-metformin   lisinopril-hydrochlorothiazide 20-25 MG tablet Commonly known as: ZESTORETIC   Tivicay 50 MG tablet Generic drug: dolutegravir     TAKE these medications   Accu-Chek FastClix Lancets Misc 1 Units by Does  not apply route 4 (four) times daily as needed.   Accu-Chek Guide Me w/Device Kit 1 Units by Does not apply route 4 (four) times daily as needed.   Accu-Chek Guide test strip Generic drug: glucose blood Use 3 times a day. Dx code: E11.9   albuterol 108 (90 Base) MCG/ACT inhaler Commonly known as: VENTOLIN HFA Inhale 2 puffs into the lungs every 4 (four) hours as needed for wheezing or shortness of breath.   ALPRAZolam 1 MG tablet Commonly known as: XANAX Take 1 mg by mouth 2 (two) times daily as needed for anxiety (panic attack).   atorvastatin 10 MG tablet Commonly known as: LIPITOR Take 1 tablet (10 mg total) by mouth daily.   benzonatate 100 MG capsule Commonly known as: Tessalon Perles Take 1 capsule (100 mg total) by mouth 3 (three) times daily as needed for cough.   Biktarvy 50-200-25 MG Tabs tablet Generic drug: bictegravir-emtricitabine-tenofovir AF Take 1 tablet by mouth daily.   blood glucose meter kit and supplies Kit Dispense based on patient and insurance preference. Use up to four times daily as directed. (FOR ICD-9 250.00, 250.01).   buPROPion 300 MG 24 hr tablet Commonly known as: WELLBUTRIN XL Take 300 mg by mouth daily.   busPIRone 10 MG tablet Commonly known as: BUSPAR Take 10 mg by mouth 3 (three) times daily.    carvedilol 3.125 MG tablet Commonly known as: COREG Take 1 tablet (3.125 mg total) by mouth 2 (two) times daily with a meal.   empagliflozin 10 MG Tabs tablet Commonly known as: JARDIANCE Take 1 tablet (10 mg total) by mouth daily. Start taking on: December 19, 2020   furosemide 40 MG tablet Commonly known as: Lasix Take 0.5 tablets (20 mg total) by mouth daily.   Global Ease Inject Pen Needles 32G X 4 MM Misc Generic drug: Insulin Pen Needle USE AS DIRECTED TO ADMINISTER INSULIN THREE TIMES DAILY BEFORE MEALS   insulin aspart 100 UNIT/ML FlexPen Commonly known as: NOVOLOG Per sliding scale. Max daily dose 40 units. What changed:   how much to take  how to take this  when to take this  additional instructions   Lantus SoloStar 100 UNIT/ML Solostar Pen Generic drug: insulin glargine INJECT 30 UNITS INTO THE SKIN DAILY AT 10 IN THE EVENING What changed:   how much to take  how to take this  when to take this  additional instructions   lisinopril 5 MG tablet Commonly known as: ZESTRIL Take 1 tablet (5 mg total) by mouth daily. Start taking on: December 20, 2020   sertraline 100 MG tablet Commonly known as: ZOLOFT Take by mouth daily.   spironolactone 25 MG tablet Commonly known as: Aldactone Take 0.5 tablets (12.5 mg total) by mouth daily.   traZODone 50 MG tablet Commonly known as: DESYREL Take 50-100 mg by mouth at bedtime as needed for sleep.        Follow-up Information    O'Neal, Cassie Freer, MD Follow up.   Specialties: Internal Medicine, Cardiology, Radiology Why: office will contact you for follow up Contact information: [place] [address] [address], [place], [place]. Schedule an appointment as soon as possible for a visit in 1 week(s).   Specialty: Family Medicine Contact information: [place]. [place] [address] [phone]        Carlyle Basques, MD. Schedule an appointment as  soon as possible for a visit in  1 week(s).   Specialty: Infectious Diseases Why: HIV medication management Contact information: [address] [place] [address] [phone]               Major procedures and Radiology Reports - PLEASE review detailed and final reports thoroughly  -      DG Chest 2 View  Result Date: 12/11/2020 CLINICAL DATA:  40 year old female with shortness of breath for several weeks. EXAM: CHEST - 2 VIEW COMPARISON:  Chest radiographs 01/15/2013. FINDINGS: PA and lateral views. Lung volumes and mediastinal contours have not significantly changed since 2014. Visualized tracheal air column is within normal limits. No pneumothorax. No layering pleural effusion. However, suggestion of increased pleural fluid in the fissures compared to prior, including the right minor fissure. Superimposed widespread increased bilateral pulmonary interstitial opacity. No consolidation or confluent opacity. No acute osseous abnormality identified.  Paucity of bowel gas. IMPRESSION: Constellation suspicious for acute pulmonary interstitial edema with trace pleural fluid in the fissures. Viral/atypical respiratory infection felt less likely. Electronically Signed   By: Genevie Ann M.D.   On: 12/11/2020 09:55   CT ANGIO CHEST PE W OR WO CONTRAST  Result Date: 12/11/2020 CLINICAL DATA:  COVID positive with exertional shortness of breath. EXAM: CT ANGIOGRAPHY CHEST WITH CONTRAST TECHNIQUE: Multidetector CT imaging of the chest was performed using the standard protocol during bolus administration of intravenous contrast. Multiplanar CT image reconstructions and MIPs were obtained to evaluate the vascular anatomy. CONTRAST:  14m OMNIPAQUE IOHEXOL 350 MG/ML SOLN COMPARISON:  None. FINDINGS: Cardiovascular: Satisfactory opacification of the pulmonary arteries to the segmental level. No evidence of pulmonary embolism. Normal heart size. No pericardial effusion. Mediastinum/Nodes: No  enlarged mediastinal, hilar, or axillary lymph nodes. Thyroid gland, trachea, and esophagus demonstrate no significant findings. Lungs/Pleura: Very mild atelectasis and/or early infiltrate is seen within the posterior aspect of the bilateral lung bases. Small bilateral pleural effusions are noted. There is no evidence of a pneumothorax. Upper Abdomen: Multiple surgical clips are seen within the gallbladder fossa. Musculoskeletal: No chest wall abnormality. No acute or significant osseous findings. Review of the MIP images confirms the above findings. IMPRESSION: 1. No CT evidence of pulmonary embolism. 2. Very mild posterior bilateral lower lobe atelectasis and/or early infiltrate. 3. Small bilateral pleural effusions. 4. Evidence of prior cholecystectomy. Electronically Signed   By: TVirgina NorfolkM.D.   On: 12/11/2020 19:01   ECHOCARDIOGRAM COMPLETE  Result Date: 12/12/2020    ECHOCARDIOGRAM REPORT   Patient Name:   BLYNZIEPETTIFORD Date of Exam: 12/12/2020 Medical Rec #:  [id number]          Height:       60.0 in Accession #:    [id number]         Weight:       270.0 lb Date of Birth:  1March 10, 1982         BSA:          2.121 m Patient Age:    89years            BP:           128/81 mmHg Patient Gender: F                   HR:           95 bpm. Exam Location:  Inpatient Procedure: 2D Echo, Cardiac Doppler and Color Doppler Indications:    R06.02 SOB  History:  Patient has no prior history of Echocardiogram examinations.                 Signs/Symptoms:Shortness of Breath and Dyspnea; Risk                 Factors:Current Smoker, Hypertension and Diabetes. Covid                 positive.  Sonographer:    Roseanna Rainbow RDCS Referring Phys: [place]  Sonographer Comments: Patient is morbidly obese and Technically difficult study due to poor echo windows. Image acquisition challenging due to patient body habitus. IMPRESSIONS  1. Left ventricular ejection fraction, by estimation, is 35 to 40%. The  left ventricle has moderately decreased function. The left ventricle demonstrates global hypokinesis. There is mild left ventricular hypertrophy. Left ventricular diastolic parameters are consistent with Grade II diastolic dysfunction (pseudonormalization).  2. Right ventricular systolic function is normal. The right ventricular size is normal.  3. The mitral valve is normal in structure. Trivial mitral valve regurgitation. No evidence of mitral stenosis.  4. The aortic valve is normal in structure. Aortic valve regurgitation is not visualized. No aortic stenosis is present.  5. The inferior vena cava is normal in size with greater than 50% respiratory variability, suggesting right atrial pressure of 3 mmHg. FINDINGS  Left Ventricle: Left ventricular ejection fraction, by estimation, is 35 to 40%. The left ventricle has moderately decreased function. The left ventricle demonstrates global hypokinesis. The left ventricular internal cavity size was normal in size. There is mild left ventricular hypertrophy. Left ventricular diastolic parameters are consistent with Grade II diastolic dysfunction (pseudonormalization).  LV Wall Scoring: The inferior wall, basal anteroseptal segment, and mid inferoseptal segment are akinetic. Right Ventricle: The right ventricular size is normal. No increase in right ventricular wall thickness. Right ventricular systolic function is normal. Left Atrium: Left atrial size was normal in size. Right Atrium: Right atrial size was normal in size. Pericardium: There is no evidence of pericardial effusion. Mitral Valve: The mitral valve is normal in structure. Trivial mitral valve regurgitation. No evidence of mitral valve stenosis. Tricuspid Valve: The tricuspid valve is normal in structure. Tricuspid valve regurgitation is not demonstrated. No evidence of tricuspid stenosis. Aortic Valve: The aortic valve is normal in structure. Aortic valve regurgitation is not visualized. No aortic stenosis  is present. Pulmonic Valve: The pulmonic valve was normal in structure. Pulmonic valve regurgitation is not visualized. No evidence of pulmonic stenosis. Aorta: The aortic root is normal in size and structure. Venous: The inferior vena cava is normal in size with greater than 50% respiratory variability, suggesting right atrial pressure of 3 mmHg. IAS/Shunts: No atrial level shunt detected by color flow Doppler.  LEFT VENTRICLE PLAX 2D LVIDd:         5.00 cm      Diastology LVIDs:         4.80 cm      LV e' medial:    5.87 cm/s LV PW:         1.00 cm      LV E/e' medial:  20.8 LV IVS:        1.30 cm      LV e' lateral:   5.87 cm/s LVOT diam:     1.80 cm      LV E/e' lateral: 20.8 LV SV:         55 LV SV Index:   26 LVOT Area:     2.54 cm  LV Volumes (MOD) LV vol d, MOD A2C: 98.3 ml LV vol d, MOD A4C: 120.0 ml LV vol s, MOD A2C: 65.4 ml LV vol s, MOD A4C: 72.6 ml LV SV MOD A2C:     32.9 ml LV SV MOD A4C:     120.0 ml LV SV MOD BP:      43.0 ml RIGHT VENTRICLE             IVC RV S prime:     11.40 cm/s  IVC diam: 1.40 cm TAPSE (M-mode): 2.0 cm LEFT ATRIUM             Index       RIGHT ATRIUM           Index LA diam:        3.40 cm 1.60 cm/m  RA Area:     12.10 cm LA Vol (A2C):   38.6 ml 18.20 ml/m RA Volume:   24.20 ml  11.41 ml/m LA Vol (A4C):   39.0 ml 18.39 ml/m LA Biplane Vol: 39.3 ml 18.53 ml/m  AORTIC VALVE LVOT Vmax:   122.00 cm/s LVOT Vmean:  88.200 cm/s LVOT VTI:    0.216 m  AORTA Ao Root diam: 2.60 cm Ao Asc diam:  2.90 cm MITRAL VALVE MV Area (PHT): 5.02 cm     SHUNTS MV Decel Time: 151 msec     Systemic VTI:  0.22 m MV E velocity: 122.00 cm/s  Systemic Diam: 1.80 cm MV A velocity: 80.40 cm/s MV E/A ratio:  1.52 Candee Furbish MD Electronically signed by Candee Furbish MD Signature Date/Time: 12/12/2020/12:49:32 PM    Final    ECHOCARDIOGRAM LIMITED  Result Date: 12/15/2020    ECHOCARDIOGRAM LIMITED REPORT   Patient Name:   Sherry Hall Date of Exam: 12/15/2020 Medical Rec #:  [id number]            Height:       60.0 in Accession #:    [id number]          Weight:       270.0 lb Date of Birth:  1981-08-28          BSA:          2.121 m Patient Age:    39 years            BP:           121/81 mmHg Patient Gender: F                   HR:           93 bpm. Exam Location:  Inpatient Procedure: Limited Echo, Limited Color Doppler, Cardiac Doppler and Intracardiac            Opacification Agent Indications:    CHF-Acute Systolic D42.87  History:        Patient has prior history of Echocardiogram examinations, most                 recent 12/12/2020. Pulmonary HTN. Shortness of Breath and                 Dyspnea; Risk Factors:Current Smoker, Hypertension and Diabetes.                 Covid positive.  Sonographer:    Darlina Sicilian RDCS Referring Phys: [id number] [place]  1. Left ventricular ejection fraction, by estimation, is 35 to 40%. The left ventricle has moderately decreased function. The  left ventricle demonstrates global hypokinesis. Left ventricular diastolic parameters are consistent with Grade I diastolic dysfunction (impaired relaxation).  2. Right ventricular systolic function is normal. The right ventricular size is normal.  3. The aortic valve was not well visualized.  4. Limited echocardiogram FINDINGS  Left Ventricle: Left ventricular ejection fraction, by estimation, is 35 to 40%. The left ventricle has moderately decreased function. The left ventricle demonstrates global hypokinesis. Definity contrast agent was given IV to delineate the left ventricular endocardial borders. There is no left ventricular hypertrophy. Left ventricular diastolic parameters are consistent with Grade I diastolic dysfunction (impaired relaxation). Normal left ventricular filling pressure. Right Ventricle: The right ventricular size is normal. No increase in right ventricular wall thickness. Right ventricular systolic function is normal. Aortic Valve: The aortic valve was not well visualized. Pulmonic  Valve: The pulmonic valve was not well visualized. Pulmonic valve regurgitation is not visualized. No evidence of pulmonic stenosis. Aorta: The aortic root is normal in size and structure. LEFT VENTRICLE PLAX 2D LVIDd:         5.00 cm      Diastology LVIDs:         4.40 cm      LV e' medial:    5.11 cm/s LV PW:         0.80 cm      LV E/e' medial:  11.5 LV IVS:        0.90 cm      LV e' lateral:   6.65 cm/s LVOT diam:     1.70 cm      LV E/e' lateral: 8.8 LV SV:         30 LV SV Index:   14 LVOT Area:     2.27 cm  LV Volumes (MOD) LV vol d, MOD A2C: 126.7 ml LV vol d, MOD A4C: 142.5 ml LV vol s, MOD A2C: 77.4 ml LV vol s, MOD A4C: 103.3 ml LV SV MOD A2C:     49.3 ml LV SV MOD A4C:     142.5 ml LV SV MOD BP:      41.9 ml LEFT ATRIUM         Index LA diam:    3.50 cm 1.65 cm/m  AORTIC VALVE LVOT Vmax:   96.00 cm/s LVOT Vmean:  61.400 cm/s LVOT VTI:    0.134 m MITRAL VALVE MV Area (PHT): 3.99 cm    SHUNTS MV Decel Time: 190 msec    Systemic VTI:  0.13 m MV E velocity: 58.70 cm/s  Systemic Diam: 1.70 cm MV A velocity: 78.40 cm/s MV E/A ratio:  0.75 Carlyle Dolly MD Electronically signed by Carlyle Dolly MD Signature Date/Time: 12/15/2020/2:47:04 PM    Final     Micro Results    Recent Results (from the past 240 hour(s))  SARS CORONAVIRUS 2 (TAT 6-24 HRS) Nasopharyngeal Nasopharyngeal Swab     Status: Abnormal   Collection Time: 12/11/20 11:18 AM   Specimen: Nasopharyngeal Swab  Result Value Ref Range Status   SARS Coronavirus 2 POSITIVE (A) NEGATIVE Final    Comment: (NOTE) SARS-CoV-2 target nucleic acids are DETECTED.  The SARS-CoV-2 RNA is generally detectable in upper and lower respiratory specimens during the acute phase of infection. Positive results are indicative of the presence of SARS-CoV-2 RNA. Clinical correlation with patient history and other diagnostic information is  necessary to determine patient infection status. Positive results do not rule out bacterial infection or  co-infection with other viruses.  The expected  result is Negative.  Fact Sheet for Patients: SugarRoll.be  Fact Sheet for Healthcare Providers: https://www.woods-mathews.com/  This test is not yet approved or cleared by the [place] FDA and  has been authorized for detection and/or diagnosis of SARS-CoV-2 by FDA under an Emergency Use Authorization (EUA). This EUA will remain  in effect (meaning this test can be used) for the duration of the COVID-19 declaration under Section 564(b)(1) of the Act, 21 U. S.C. section 360bbb-3(b)(1), unless the authorization is terminated or revoked sooner.   Performed at [facility], [place] [address] [place], [place] [address]     Today   Subjective    Amana Suchan today has no headache,no chest abdominal pain,no new weakness tingling or numbness, feels much better wants to go home today.    Objective   Blood pressure 117/81, pulse 85, temperature (!) 97.4 F (36.3 C), temperature source Axillary, resp. rate 17, height 5' (1.524 m), weight 122.5 kg, SpO2 100 %.   Intake/Output Summary (Last 24 hours) at 12/18/2020 0950 Last data filed at 12/18/2020 0015 Gross per 24 hour  Intake 1248.67 ml  Output --  Net 1248.67 ml    Exam  Awake Alert, No new F.N deficits, Normal affect [place].AT,PERRAL Supple Neck,No JVD, No cervical lymphadenopathy appriciated.  Symmetrical Chest wall movement, Good air movement bilaterally, CTAB RRR,No Gallops,Rubs or new Murmurs, No Parasternal Heave +ve B.Sounds, Abd Soft, Non tender, No organomegaly appriciated, No rebound -guarding or rigidity. No Cyanosis, Clubbing or edema, No new Rash or bruise   Data Review   CBC w Diff:  Lab Results  Component Value Date   WBC 6.4 12/16/2020   HGB 11.8 (L) 12/16/2020   HGB 13.2 04/02/2020   HCT 37.0 12/16/2020   HCT 38.1 04/02/2020   PLT 215 12/16/2020   PLT 216 04/02/2020   LYMPHOPCT 34 06/05/2016    MONOPCT 6.2 08/10/2019   EOSPCT 2.6 08/10/2019   BASOPCT 0.9 08/10/2019    CMP:  Lab Results  Component Value Date   NA 135 12/18/2020   NA 137 04/02/2020   K 4.2 12/18/2020   CL 106 12/18/2020   CO2 21 (L) 12/18/2020   BUN 24 (H) 12/18/2020   BUN 14 04/02/2020   CREATININE 1.41 (H) 12/18/2020   CREATININE 0.97 08/10/2019   PROT 7.2 12/16/2020   PROT 7.8 04/02/2020   ALBUMIN 2.5 (L) 12/16/2020   ALBUMIN 3.6 (L) 04/02/2020   BILITOT 0.8 12/16/2020   BILITOT <0.2 04/02/2020   ALKPHOS 53 12/16/2020   AST 17 12/16/2020   ALT 16 12/16/2020  .   Total Time in preparing paper work, data evaluation and todays exam - 47 minutes  Lala Lund M.D on 12/18/2020 at 9:50 AM  [facility]

## 2020-12-22 NOTE — Progress Notes (Unsigned)
Cardiology Office Note:   Date:  12/23/2020  NAME:  Sherry Hall    MRN: [id number] DOB:  1980-12-17   PCP:  Sherry Dew, FNP  Cardiologist:  No primary care provider on file.   Referring MD: Sherry Dew, FNP   Chief Complaint  Patient presents with  . Congestive Heart Failure   History of Present Illness:   Sherry Hall is a 40 y.o. female with a hx of CHF, DM, obesity, HLD who presents for follow-up. Seen in hospital for new diagnosis of CHF. Started on medical therapy. Needs entresto. Her blood pressure in office is well controlled 126/84.  She reports some dizziness and lightheadedness.  She does have some low blood pressure values on her blood pressure log.  She is lost weight again.  She appears euvolemic.  I suspect she may be a bit dry.  I recommended taking Lasix daily as needed.  She was not started on Entresto due to elevated creatinine.  We need to get her on that today.  She will need lisinopril washout.  She reports she is try to become more active.  She reports some numbness and tingling in her fingers as well.  Again I suspect this is due to dehydration and her blood pressure being lower than she is used to.  She reports no exertional chest pain or pressure.  She does snore.  We discussed a sleep study.  She likely needs this as well.  She is okay to proceed with this.  She overall appears to be doing well since leaving the hospital.  Problem List 1. Systolic HF -25-05% 3/97/6734 -0 CAC on CT PE 12/11/2020 2. Diabetes -A1c 9.8 3. Obesity -BMI 52 4. HLD -T chol 235, HDL 51, LDL 147, TG 230 5. HTN  Past Medical History: Past Medical History:  Diagnosis Date  . Anemia    H/O  . Anxiety   . Depression   . Diabetes mellitus type 1 ([place])   . GERD (gastroesophageal reflux disease)   . HIV (human immunodeficiency virus infection) ([place]) 2011  . Hypertension   . Type 2 diabetes mellitus ([place])     Past Surgical History: Past Surgical History:   Procedure Laterality Date  . CESAREAN SECTION  2001, 2008, 2009, 2011  . CHOLECYSTECTOMY    . EXCISION OF BREAST LESION N/A 01/04/2018   Procedure: EXCISION OF CHEST WALL ABSCESS. MIDDLE CHEST;  Surgeon: Olean Ree, MD;  Location: [facility] ORS;  Service: General;  Laterality: N/A;  resection of chest wall cyst / abscess  . FRACTURE SURGERY Right    HAND  . TONSILLECTOMY    . TUBAL LIGATION  2011    Current Medications: Current Meds  Medication Sig  . Accu-Chek FastClix Lancets MISC 1 Units by Does not apply route 4 (four) times daily as needed.  Marland Kitchen albuterol (VENTOLIN HFA) 108 (90 Base) MCG/ACT inhaler Inhale 2 puffs into the lungs every 4 (four) hours as needed for wheezing or shortness of breath.  . ALPRAZolam (XANAX) 1 MG tablet Take 1 mg by mouth 2 (two) times daily as needed for anxiety (panic attack).  Marland Kitchen atorvastatin (LIPITOR) 10 MG tablet Take 1 tablet (10 mg total) by mouth daily.  . benzonatate (TESSALON PERLES) 100 MG capsule Take 1 capsule (100 mg total) by mouth 3 (three) times daily as needed for cough.  . bictegravir-emtricitabine-tenofovir AF (BIKTARVY) 50-200-25 MG TABS tablet Take 1 tablet by mouth daily.  . blood glucose meter kit and supplies KIT Dispense  based on patient and insurance preference. Use up to four times daily as directed. (FOR ICD-9 250.00, 250.01).  . Blood Glucose Monitoring Suppl (ACCU-CHEK GUIDE ME) w/Device KIT 1 Units by Does not apply route 4 (four) times daily as needed.  Marland Kitchen buPROPion (WELLBUTRIN XL) 300 MG 24 hr tablet Take 300 mg by mouth daily.  . busPIRone (BUSPAR) 10 MG tablet Take 10 mg by mouth 3 (three) times daily.  Marland Kitchen GLOBAL EASE INJECT PEN NEEDLES 32G X 4 MM MISC USE AS DIRECTED TO ADMINISTER INSULIN THREE TIMES DAILY BEFORE MEALS  . glucose blood (ACCU-CHEK GUIDE) test strip Use 3 times a day. Dx code: E11.9  . insulin aspart (NOVOLOG) 100 UNIT/ML FlexPen Per sliding scale. Max daily dose 40 units. (Patient taking differently: Inject 2-10  Units into the skin See admin instructions. Injects 2-10 units into the skin per sliding scale. Max daily dose 40 units. 150-199 mg/dl : 2 units; 200-249 mg/dL: 4 units; 250 - 299 mg/dL: 6 units; 300 - 349 mg/dL: 8 units; 350 -399 mg/ dL: 10 units)  . Insulin Glargine (LANTUS SOLOSTAR) 100 UNIT/ML Solostar Pen INJECT 30 UNITS INTO THE SKIN DAILY AT 10 IN THE EVENING (Patient taking differently: Inject 30 Units into the skin daily.)  . sertraline (ZOLOFT) 100 MG tablet Take by mouth daily.  . traZODone (DESYREL) 50 MG tablet Take 50-100 mg by mouth at bedtime as needed for sleep.   . [DISCONTINUED] carvedilol (COREG) 3.125 MG tablet Take 1 tablet (3.125 mg total) by mouth 2 (two) times daily with a meal.  . [DISCONTINUED] empagliflozin (JARDIANCE) 10 MG TABS tablet Take 1 tablet (10 mg total) by mouth daily.  . [DISCONTINUED] furosemide (LASIX) 40 MG tablet Take 0.5 tablets (20 mg total) by mouth daily.  . [DISCONTINUED] lisinopril (ZESTRIL) 5 MG tablet Take 1 tablet (5 mg total) by mouth daily.  . [DISCONTINUED] spironolactone (ALDACTONE) 25 MG tablet Take 0.5 tablets (12.5 mg total) by mouth daily.     Allergies:    Percocet [oxycodone-acetaminophen] and Latex   Social History: Social History   Socioeconomic History  . Marital status: Single    Spouse name: Not on file  . Number of children: Not on file  . Years of education: Not on file  . Highest education level: Not on file  Occupational History  . Not on file  Tobacco Use  . Smoking status: Current Every Day Smoker    Packs/day: 0.50    Years: 15.00    Pack years: 7.50    Types: Cigarettes  . Smokeless tobacco: Never Used  . Tobacco comment: trying to slow  Vaping Use  . Vaping Use: Never used  Substance and Sexual Activity  . Alcohol use: Yes    Alcohol/week: 1.0 standard drink    Types: 1 Standard drinks or equivalent per week    Comment: socially  . Drug use: No  . Sexual activity: Yes    Partners: Male    Birth  control/protection: Surgical  Other Topics Concern  . Not on file  Social History Narrative  . Not on file   Social Determinants of Health   Financial Resource Strain: Not on file  Food Insecurity: Not on file  Transportation Needs: Not on file  Physical Activity: Not on file  Stress: Not on file  Social Connections: Not on file     Family History: The patient's family history includes Cancer in her mother; Diabetes in her father; Hypertension in her father.  ROS:  All other ROS reviewed and negative. Pertinent positives noted in the HPI.     EKGs/Labs/Other Studies Reviewed:   The following studies were personally reviewed by me today:   TTE 12/15/2020 1. Left ventricular ejection fraction, by estimation, is 35 to 40%. The  left ventricle has moderately decreased function. The left ventricle  demonstrates global hypokinesis. Left ventricular diastolic parameters are  consistent with Grade I diastolic  dysfunction (impaired relaxation).  2. Right ventricular systolic function is normal. The right ventricular  size is normal.  3. The aortic valve was not well visualized.  4. Limited echocardiogram   Recent Labs: 12/16/2020: ALT 16; B Natriuretic Peptide 60.1; Hemoglobin 11.8; Platelets 215 12/18/2020: BUN 24; Creatinine, Ser 1.41; Magnesium 2.3; Potassium 4.2; Sodium 135   Recent Lipid Panel    Component Value Date/Time   CHOL 235 (H) 12/26/2018 1209   TRIG 230 (H) 12/26/2018 1209   HDL 51 12/26/2018 1209   CHOLHDL 4.6 12/26/2018 1209   VLDL 23 11/23/2016 1045   LDLCALC 147 (H) 12/26/2018 1209    Physical Exam:   VS:  BP 126/84   Pulse 90   Ht 5' (1.524 m)   Wt 262 lb (118.8 kg)   SpO2 93%   BMI 51.17 kg/m    Wt Readings from Last 3 Encounters:  12/23/20 262 lb (118.8 kg)  12/11/20 270 lb (122.5 kg)  04/02/20 280 lb (127 kg)    General: Well nourished, well developed, in no acute distress Head: Atraumatic, normal size  Eyes: PEERLA, EOMI  Neck:  Supple, no JVD Endocrine: No thryomegaly Cardiac: Normal S1, S2; RRR; no murmurs, rubs, or gallops Lungs: Clear to auscultation bilaterally, no wheezing, rhonchi or rales  Abd: Soft, nontender, no hepatomegaly  Ext: No edema, pulses 2+ Musculoskeletal: No deformities, BUE and BLE strength normal and equal Skin: Warm and dry, no rashes   Neuro: Alert and oriented to person, place, time, and situation, CNII-XII grossly intact, no focal deficits  Psych: Normal mood and affect   ASSESSMENT:   Lonie Pancake is a 40 y.o. female who presents for the following: 1. Chronic systolic heart failure ([place])   2. Primary hypertension   3. Mixed hyperlipidemia   4. Snoring     PLAN:   1. Chronic systolic heart failure (HCC) -Recently diagnosed in the hospital with systolic heart failure EF 35-40%.  She has global hypokinesis.  This was in the setting of uncontrolled diabetes and hypertension.  Cardiac enzymes are negative x2.  EKG was nonischemic.  Her CT PE study showed no evidence of coronary calcium.  Overall suspect this is a nonischemic cardiomyopathy.  I have recommended medical management for 3 to 6 months to reevaluate her ejection fraction. -She is describing some dizziness and lightheadedness.  I think she is over diuresed.  She will take Lasix 40 mg daily as needed. -Stop lisinopril 5 mg daily.  She will start Entresto 24-26 mg twice daily on Wednesday. -She will continue Jardiance 10 mg daily. -I recommended she take Aldactone 12.5 mg at night. -Recheck BMP and BNP today.  2. Primary hypertension -Well-controlled on above medications.  3. Mixed hyperlipidemia -Continue statin.  Need to recheck lipid profile at some point.  4. Snoring -Home sleep study recommended.  I suspect this is the source of her hypertension.  Her BMI is 52.  Disposition: Return in about 1 month (around 01/23/2021).  Medication Adjustments/Labs and Tests Ordered: Current medicines are reviewed at length  with the patient today.  Concerns regarding medicines are outlined above.  Orders Placed This Encounter  Procedures  . Basic metabolic panel  . Brain natriuretic peptide  . Home sleep test   Meds ordered this encounter  Medications  . spironolactone (ALDACTONE) 25 MG tablet    Sig: Take 0.5 tablets (12.5 mg total) by mouth at bedtime.    Dispense:  15 tablet    Refill:  0  . furosemide (LASIX) 40 MG tablet    Sig: Take 0.5 tablets (20 mg total) by mouth daily as needed.    Dispense:  30 tablet    Refill:  2  . empagliflozin (JARDIANCE) 10 MG TABS tablet    Sig: Take 1 tablet (10 mg total) by mouth daily.    Dispense:  30 tablet    Refill:  0  . carvedilol (COREG) 3.125 MG tablet    Sig: Take 1 tablet (3.125 mg total) by mouth 2 (two) times daily with a meal.    Dispense:  60 tablet    Refill:  0    Patient Instructions  Medication Instructions:  Stop Lisinopril. Start Entresto 24/26 twice daily (take samples given)  Take Lasix daily AS NEEDED. Take Aldactone at night.  *If you need a refill on your cardiac medications before your next appointment, please call your pharmacy*   Lab Work: BNP, BMET today  If you have labs (blood work) drawn today and your tests are completely normal, you will receive your results only by: Marland Kitchen MyChart Message (if you have MyChart) OR . A paper copy in the mail If you have any lab test that is abnormal or we need to change your treatment, we will call you to review the results.   Testing/Procedures: Your physician has recommended that you have a sleep study. This test records several body functions during sleep, including: brain activity, eye movement, oxygen and carbon dioxide blood levels, heart rate and rhythm, breathing rate and rhythm, the flow of air through your mouth and nose, snoring, body muscle movements, and chest and belly movement.   Follow-Up: At [facility], you and your health needs are our priority.  As part of our  continuing mission to provide you with exceptional heart care, we have created designated Provider Care Teams.  These Care Teams include your primary Cardiologist (physician) and Advanced Practice Providers (APPs -  Physician Assistants and Nurse Practitioners) who all work together to provide you with the care you need, when you need it.  We recommend signing up for the patient portal called "MyChart".  Sign up information is provided on this After Visit Summary.  MyChart is used to connect with patients for Virtual Visits (Telemedicine).  Patients are able to view lab/test results, encounter notes, upcoming appointments, etc.  Non-urgent messages can be sent to your provider as well.   To learn more about what you can do with MyChart, go to [email or web address].    Your next appointment:   1 month(s)  The format for your next appointment:   In Person  Provider:   Eleonore Chiquito, MD   Other Instructions Please see PHARMD in 2 weeks for titration of Entresto.      Time Spent with Patient: I have spent a total of 35 minutes with patient reviewing hospital notes, telemetry, EKGs, labs and examining the patient as well as establishing an assessment and plan that was discussed with the patient.  > 50% of time was spent in direct patient care.  Signed, Addison Naegeli. Audie Box, MD [facility]  [address] [place], [place] [address] [phone]  12/23/2020 11:50 AM

## 2020-12-23 ENCOUNTER — Ambulatory Visit (INDEPENDENT_AMBULATORY_CARE_PROVIDER_SITE_OTHER): Payer: Medicare Other | Admitting: Cardiovascular Disease

## 2020-12-23 ENCOUNTER — Other Ambulatory Visit: Payer: Self-pay

## 2020-12-23 ENCOUNTER — Encounter: Payer: Self-pay | Admitting: Cardiovascular Disease

## 2020-12-23 VITALS — BP 126/84 | HR 90 | Ht 60.0 in | Wt 262.0 lb

## 2020-12-23 DIAGNOSIS — I5022 Chronic systolic (congestive) heart failure: Secondary | ICD-10-CM

## 2020-12-23 DIAGNOSIS — I10 Essential (primary) hypertension: Secondary | ICD-10-CM

## 2020-12-23 DIAGNOSIS — R0683 Snoring: Secondary | ICD-10-CM | POA: Diagnosis not present

## 2020-12-23 DIAGNOSIS — E782 Mixed hyperlipidemia: Secondary | ICD-10-CM

## 2020-12-23 MED ORDER — CARVEDILOL 3.125 MG PO TABS: 3.1250 mg | ORAL_TABLET | Freq: Two times a day (BID) | ORAL | 0 refills | Status: DC

## 2020-12-23 MED ORDER — FUROSEMIDE 40 MG PO TABS: 20.0000 mg | ORAL_TABLET | Freq: Every day | ORAL | 2 refills | Status: DC | PRN

## 2020-12-23 MED ORDER — SPIRONOLACTONE 25 MG PO TABS: 12.5000 mg | ORAL_TABLET | Freq: Every day | ORAL | 0 refills | Status: DC

## 2020-12-23 MED ORDER — EMPAGLIFLOZIN 10 MG PO TABS
10.0000 mg | ORAL_TABLET | Freq: Every day | ORAL | 0 refills | Status: DC
Start: 2020-12-23 — End: 2021-01-21

## 2020-12-23 NOTE — Patient Instructions (Addendum)
Medication Instructions:  Stop Lisinopril. Start Entresto 24/26 twice daily (take samples given)  Take Lasix daily AS NEEDED. Take Aldactone at night.  *If you need a refill on your cardiac medications before your next appointment, please call your pharmacy*   Lab Work: BNP, BMET today  If you have labs (blood work) drawn today and your tests are completely normal, you will receive your results only by: Marland Kitchen MyChart Message (if you have MyChart) OR . A paper copy in the mail If you have any lab test that is abnormal or we need to change your treatment, we will call you to review the results.   Testing/Procedures: Your physician has recommended that you have a sleep study. This test records several body functions during sleep, including: brain activity, eye movement, oxygen and carbon dioxide blood levels, heart rate and rhythm, breathing rate and rhythm, the flow of air through your mouth and nose, snoring, body muscle movements, and chest and belly movement.   Follow-Up: At [facility], you and your health needs are our priority.  As part of our continuing mission to provide you with exceptional heart care, we have created designated Provider Care Teams.  These Care Teams include your primary Cardiologist (physician) and Advanced Practice Providers (APPs -  Physician Assistants and Nurse Practitioners) who all work together to provide you with the care you need, when you need it.  We recommend signing up for the patient portal called "MyChart".  Sign up information is provided on this After Visit Summary.  MyChart is used to connect with patients for Virtual Visits (Telemedicine).  Patients are able to view lab/test results, encounter notes, upcoming appointments, etc.  Non-urgent messages can be sent to your provider as well.   To learn more about what you can do with MyChart, go to [email or web address].    Your next appointment:   1 month(s)  The format for your next appointment:    In Person  Provider:   Lennie Odor, MD   Other Instructions Please see PHARMD in 2 weeks for titration of Entresto.

## 2020-12-24 ENCOUNTER — Telehealth (INDEPENDENT_AMBULATORY_CARE_PROVIDER_SITE_OTHER): Payer: Medicare Other | Admitting: Family Medicine

## 2020-12-24 ENCOUNTER — Encounter: Payer: Self-pay | Admitting: Family Medicine

## 2020-12-24 VITALS — Ht 60.0 in | Wt 262.0 lb

## 2020-12-24 DIAGNOSIS — F320 Major depressive disorder, single episode, mild: Secondary | ICD-10-CM | POA: Diagnosis not present

## 2020-12-24 DIAGNOSIS — B20 Human immunodeficiency virus [HIV] disease: Secondary | ICD-10-CM | POA: Diagnosis not present

## 2020-12-24 DIAGNOSIS — I10 Essential (primary) hypertension: Secondary | ICD-10-CM | POA: Diagnosis not present

## 2020-12-24 DIAGNOSIS — E118 Type 2 diabetes mellitus with unspecified complications: Secondary | ICD-10-CM

## 2020-12-24 DIAGNOSIS — Z09 Encounter for follow-up examination after completed treatment for conditions other than malignant neoplasm: Secondary | ICD-10-CM

## 2020-12-24 DIAGNOSIS — Z794 Long term (current) use of insulin: Secondary | ICD-10-CM

## 2020-12-24 LAB — BASIC METABOLIC PANEL
BUN/Creatinine Ratio: 19 (ref 9–23)
BUN: 29 mg/dL — ABNORMAL HIGH (ref 6–24)
CO2: 20 mmol/L (ref 20–29)
Calcium: 9.9 mg/dL (ref 8.7–10.2)
Chloride: 96 mmol/L (ref 96–106)
Creatinine, Ser: 1.53 mg/dL — ABNORMAL HIGH (ref 0.57–1.00)
Glucose: 224 mg/dL — ABNORMAL HIGH (ref 65–99)
Potassium: 4.9 mmol/L (ref 3.5–5.2)
Sodium: 132 mmol/L — ABNORMAL LOW (ref 134–144)
eGFR: 44 mL/min/{1.73_m2} — ABNORMAL LOW (ref >59)

## 2020-12-24 LAB — BRAIN NATRIURETIC PEPTIDE: BNP: 82.8 pg/mL (ref 0.0–100.0)

## 2020-12-24 NOTE — Progress Notes (Signed)
Virtual Visit via Telephone Note  I connected with Sherry Hall on 12/24/20 at  2:20 PM EST by telephone and verified that I am speaking with the correct person using two identifiers.  Location: Patient: Home Provider: [facility]   I discussed the limitations, risks, security and privacy concerns of performing an evaluation and management service by telephone and the availability of in person appointments. I also discussed with the patient that there may be a patient responsible charge related to this service. The patient expressed understanding and agreed to proceed.   History of Present Illness:   Sherry Hall is a 40 year old female with a medical history significant for uncontrolled type 2 diabetes mellitus, hypertension, HIV, hyperlipidemia, morbid obesity, CHF, status post Covid pneumonia, and depression presents via video for a follow-up of chronic conditions. Patient was admitted to hospital on 12/11/2020 with dyspnea on exertion and fatigue.  She was found to have COVID-19 positive test (U07.1, COVID-19) with Acute Respiratory Distress Syndrome (ARDS) (J80, ARDS) (If respiratory failure or sepsis present, add as separate assessment)  Related pneumonia at that time.  Patient continues to have some shortness of breath with exertion.  Also, during that time patient had an exacerbation of CHF.  She was followed by cardiology during admission and has an outpatient follow-up scheduled.  Patient has had a difficult time adhering to medication regimen over the past 5-6 months.  She says that she has had some depression.  She endorses anhedonia, constant worrying, sadness, and hopelessness.  She says that that sometimes interferes with taking medications.  Patient has a history of HIV and had been nonadherent to medication regimen and appointments for greater than 6 months prior to hospital admission.  Patient says that she is scheduled to follow-up with infectious disease. She also has a  history of type 2 diabetes mellitus.  She says that she has been taking all prescribed medications since hospital discharge.  She does not check blood glucose levels routinely.  Diabetes She presents for her follow-up diabetic visit. She has type 2 diabetes mellitus. Her disease course has been stable. Pertinent negatives for hypoglycemia include no confusion, headaches, hunger, seizures, sweats or tremors. Pertinent negatives for diabetes include no blurred vision, no chest pain, no fatigue, no polydipsia, no polyphagia, no polyuria, no weakness and no weight loss. Symptoms are stable. Risk factors for coronary artery disease include obesity, sedentary lifestyle and tobacco exposure. When asked about meal planning, she reported none. She has not had a previous visit with a dietitian. An ACE inhibitor/angiotensin II receptor blocker is being taken. She does not see a podiatrist.Eye exam is not current.  Hypertension The problem is uncontrolled. Pertinent negatives include no blurred vision, chest pain, headaches or sweats. Risk factors for coronary artery disease include diabetes mellitus, obesity and smoking/tobacco exposure. There is no history of heart failure or left ventricular hypertrophy.    Past Medical History:  Diagnosis Date  . Anemia    H/O  . Anxiety   . Depression   . Diabetes mellitus type 1 (HCC)   . GERD (gastroesophageal reflux disease)   . HIV (human immunodeficiency virus infection) (HCC) 2011  . Hypertension   . Type 2 diabetes mellitus (HCC)    Social History   Socioeconomic History  . Marital status: Single    Spouse name: Not on file  . Number of children: Not on file  . Years of education: Not on file  . Highest education level: Not on file  Occupational History  .  Not on file  Tobacco Use  . Smoking status: Current Every Day Smoker    Packs/day: 0.50    Years: 15.00    Pack years: 7.50    Types: Cigarettes  . Smokeless tobacco: Never Used  . Tobacco  comment: trying to slow  Vaping Use  . Vaping Use: Never used  Substance and Sexual Activity  . Alcohol use: Yes    Alcohol/week: 1.0 standard drink    Types: 1 Standard drinks or equivalent per week    Comment: socially  . Drug use: No  . Sexual activity: Yes    Partners: Male    Birth control/protection: Surgical  Other Topics Concern  . Not on file  Social History Narrative  . Not on file   Social Determinants of Health   Financial Resource Strain: Not on file  Food Insecurity: Not on file  Transportation Needs: Not on file  Physical Activity: Not on file  Stress: Not on file  Social Connections: Not on file  Intimate Partner Violence: Not on file   Immunization History  Administered Date(s) Administered  . Hep A / Hep B 01/28/2010, 03/11/2010  . Hepatitis A, Adult 01/28/2010, 03/11/2010  . Hepatitis B 01/28/2010, 03/11/2010  . Influenza,inj,Quad PF,6+ Mos 09/16/2015, 08/10/2019  . Influenza-Unspecified 11/16/2011, 10/20/2012  . PPD Test 01/28/2010, 11/16/2011, 01/11/2013  . Pneumococcal Conjugate-13 10/20/2012, 01/03/2016  . Pneumococcal Polysaccharide-23 02/04/2010, 10/20/2012  . Tdap 01/11/2013    Observations/Objective:  1. Type 2 diabetes mellitus with complication, with long-term current use of insulin (HCC) Patient to follow-up for labs in 1 month.  Discussed hemoglobin A1c at length.  Goal is less than 7 is to resume checking blood glucose 3 times daily. - Comprehensive metabolic panel; Future - Hemoglobin A1c; Future - CBC with Differential; Future  2. HIV disease (HCC) Schedule first available follow up with infectious disease  3. Essential hypertension - Comprehensive metabolic panel; Future - Hemoglobin A1c; Future - CBC with Differential; Future  4. Current mild episode of major depressive disorder, unspecified whether recurrent (HCC) PHQ9 SCORE ONLY 12/24/2020 08/19/2020 04/02/2020  PHQ-9 Total Score 9 0 16     5. Hospital discharge  follow-up Patient advised to follow-up with cardiology as scheduled.  Also, discussed the importance of adhering to prescribed medication regimen in order to achieve positive outcomes.  Assessment and Plan:   Follow Up Instructions:    I discussed the assessment and treatment plan with the patient. The patient was provided an opportunity to ask questions and all were answered. The patient agreed with the plan and demonstrated an understanding of the instructions.   The patient was advised to call back or seek an in-person evaluation if the symptoms worsen or if the condition fails to improve as anticipated.  I provided 13 minutes of non-face-to-face time during this encounter.  Nolon Nations  APRN, MSN, FNP-C Patient Care [facility] [address] [place], [place] [address] [phone]

## 2021-01-02 ENCOUNTER — Telehealth: Payer: Self-pay | Admitting: *Deleted

## 2021-01-02 NOTE — Telephone Encounter (Signed)
Called to notify of HST appointment. Unable to leave a message. Voice mailbox not set up.

## 2021-01-03 ENCOUNTER — Encounter: Payer: Self-pay | Admitting: Infectious Diseases

## 2021-01-03 ENCOUNTER — Other Ambulatory Visit: Payer: Self-pay

## 2021-01-03 ENCOUNTER — Ambulatory Visit (INDEPENDENT_AMBULATORY_CARE_PROVIDER_SITE_OTHER): Payer: Medicare Other | Admitting: Infectious Diseases

## 2021-01-03 VITALS — BP 139/83 | HR 89 | Temp 97.3°F | Wt 268.0 lb

## 2021-01-03 DIAGNOSIS — H538 Other visual disturbances: Secondary | ICD-10-CM

## 2021-01-03 DIAGNOSIS — E1065 Type 1 diabetes mellitus with hyperglycemia: Secondary | ICD-10-CM | POA: Diagnosis not present

## 2021-01-03 DIAGNOSIS — I5022 Chronic systolic (congestive) heart failure: Secondary | ICD-10-CM | POA: Diagnosis not present

## 2021-01-03 DIAGNOSIS — F419 Anxiety disorder, unspecified: Secondary | ICD-10-CM | POA: Diagnosis not present

## 2021-01-03 DIAGNOSIS — R200 Anesthesia of skin: Secondary | ICD-10-CM | POA: Diagnosis not present

## 2021-01-03 DIAGNOSIS — B20 Human immunodeficiency virus [HIV] disease: Secondary | ICD-10-CM

## 2021-01-03 NOTE — Assessment & Plan Note (Signed)
I suspect this could be neuropathy given her long standing diabetes but the way she describes it with certain activities and sudden onset with resolution after repositioning could be carpal tunnel. Discussed trial with braces to see if that helps. Wear overnight if the numbness occurs with waking in AM.

## 2021-01-03 NOTE — Assessment & Plan Note (Signed)
Working with her psychiatrist - stable on current regimen.

## 2021-01-03 NOTE — Assessment & Plan Note (Signed)
Back on track with her medications. She is working with PCP and has had a visit for hospital follow up recently.  With blurry vision complaint will refer her to diabetic retinal specialist for care.

## 2021-01-03 NOTE — Patient Instructions (Addendum)
For the numbness I am not sure if it is a medication thing or a combination with a condition called carpal tunnel. You could try to pick up a carpal tunnel brace from the pharmacy to see if that helps prevent them from going to sleep.   Increase the Entresto to twice a day with your Coreg.   Stop by the lab on your way out and we can see your progress with your medication.   Keep up the good work - I am proud of you and happy to help keep you well.   Please come back in 3 months so we can check in again. Can do labs at the visit same day.

## 2021-01-03 NOTE — Progress Notes (Signed)
Name: Sherry Hall  JSR:[id number]   DOB: Aug 26, 1981   PCP: Dorena Dew, FNP    Brief History ID: Sherry Hall is a 40 y.o. female with HIV disease. Dx 2011. Intermittently on medications. Co morbidities: T1DM, HTN, Hyperlipidemia, obesity. HIV Risk: heterosexual Hx: OIs: none known  Hep B sAg (not on file); Hep BsAb (neg); Hep B cAb (not on file); Hep C (not on file); Hep A (not on file); HLA B*5701 (-); Quantiferon (-)  Previous Regimens:  Complera  Odefsey  Biktarvy    Genotypes:  2017 - no significant mutations PR/RT   CC:  HIV follow up care. Increased anxiety. Ran out of insulin.    HPI:  Recently discharged from the hospital - has had some intense dizziness with her new medications. Hand numbness and feet numbness is challenging. All of a sudden the arm feels dead and heavy. This happened pretty consistently the first week home but this has improved over this week. Does not last long. Feels like it is asleep not tingling. Usually when she is sitting in certain positions and doing certain things  Happens very suddenly. She also notices some blurry vision at this time with a "blue tint".   She has a lot of new medications - lasix is now as needed. This has improved her dizziness for the most part. The spironolactone is one she feels is causing the most side effects. This hospitalization and a death of a childhood friend recently have really been a wake up call for her. She wants to be well and here for the long term.   Has not missed a dose of the biktarvy since we started her back on it in the hospital. No side effects to this medication from what she can tell. Happy to have freedom from taking it with food like the odefsey before. One regular female partner, her fiance, who is also a patient here.   Working with her psychiatrist currently - great relationship with her.    Review of Systems  Constitutional: Negative for chills, fatigue and fever.   HENT: Negative for sore throat.        No dental problems  Eyes:       Blurry vision  Respiratory: Negative for cough.   Cardiovascular: Negative for chest pain and leg swelling.  Gastrointestinal: Positive for constipation (intermittently ). Negative for abdominal pain, diarrhea and vomiting.  Genitourinary: Negative for dysuria and flank pain.  Musculoskeletal: Negative for myalgias and neck pain.  Skin: Negative for rash.  Neurological: Positive for numbness. Negative for dizziness and headaches.  Psychiatric/Behavioral: Positive for dysphoric mood. The patient is nervous/anxious.     Observations/Objective: Today's Vitals   01/03/21 1046  BP: 139/83  Pulse: 89  Temp: (!) 97.3 F (36.3 C)  TempSrc: Oral  Weight: 268 lb (121.6 kg)   Body mass index is 52.34 kg/m.   Physical Exam Vitals reviewed.  Constitutional:      Appearance: She is well-developed.     Comments: Seated comfortably in chair.   HENT:     Mouth/Throat:     Mouth: No oral lesions.     Dentition: Normal dentition. No dental abscesses.     Pharynx: No oropharyngeal exudate.  Cardiovascular:     Rate and Rhythm: Normal rate and regular rhythm.     Heart sounds: Normal heart sounds.  Pulmonary:     Effort: Pulmonary effort is normal.     Breath sounds: Normal breath sounds.  Abdominal:  General: There is no distension.     Palpations: Abdomen is soft.     Tenderness: There is no abdominal tenderness.  Lymphadenopathy:     Cervical: No cervical adenopathy.  Skin:    General: Skin is warm and dry.     Findings: No rash.  Neurological:     Mental Status: She is alert and oriented to person, place, and time.  Psychiatric:        Judgment: Judgment normal.     Comments: In good spirits today and engaged in care discussion      HIV 1 RNA Quant (copies/mL)  Date Value  12/13/2020 3,510  08/10/2019 836 (H)  12/26/2018 170 (H)   CD4 T Cell Abs (/uL)  Date Value  12/12/2020 570   08/10/2019 535  12/26/2018 390 (L)    Lab Results  Component Value Date   CREATININE 1.53 (H) 12/23/2020   CREATININE 1.41 (H) 12/18/2020   CREATININE 1.80 (H) 12/17/2020    Lab Results  Component Value Date   WBC 6.4 12/16/2020   HGB 11.8 (L) 12/16/2020   HCT 37.0 12/16/2020   MCV 96.9 12/16/2020   PLT 215 12/16/2020    Lab Results  Component Value Date   ALT 16 12/16/2020   AST 17 12/16/2020   ALKPHOS 53 12/16/2020   BILITOT 0.8 12/16/2020     Assessment and Plan:  Problem List Items Addressed This Visit      High   Type 1 diabetes mellitus with hyperglycemia ([place]) - Primary    Back on track with her medications. She is working with PCP and has had a visit for hospital follow up recently.  With blurry vision complaint will refer her to diabetic retinal specialist for care.       Relevant Orders   Ambulatory referral to Ophthalmology   HIV disease [facility]) (Chronic)    We reviewed labs from 3 weeks ago during hospitalization. VL 3500 and CD4 still preserved around 500. I suspect she may be undetectable already with the excellent adherence she describes. Will check VL for her today and let her know results.  Will have her return to see me in 3 months for repeat blood work to ensure she is well controlled on new regimen. She seems to be tolerating this well overall without side effects.       Relevant Orders   HIV-1 RNA quant-no reflex-bld     Unprioritized   Chronic systolic congestive heart failure ([place])    Recently diagnosed with reduce ejection fracture in the setting of pulmonary edema. In care with cardiology and now on Entresto , carvedilol and spironolactone. She was only taking Entresto once a day - reviewed rx with her and asked her to take this twice daily. She is feeling the best she has in a long time overall.  I gave her two weekly pill boxes for AM, afternoon and PM dosing to make things easier for her.       Bilateral hand numbness    I suspect  this could be neuropathy given her long standing diabetes but the way she describes it with certain activities and sudden onset with resolution after repositioning could be carpal tunnel. Discussed trial with braces to see if that helps. Wear overnight if the numbness occurs with waking in AM.       Anxiety disorder (Chronic)    Working with her psychiatrist - stable on current regimen.        Other Visit Diagnoses  Blurry vision       Relevant Orders   Ambulatory referral to Ophthalmology      Janene Madeira, MSN, NP-C [facility] [email or web address] Pager: [phone] Office: [place]: [phone]

## 2021-01-03 NOTE — Assessment & Plan Note (Signed)
Recently diagnosed with reduce ejection fracture in the setting of pulmonary edema. In care with cardiology and now on Entresto , carvedilol and spironolactone. She was only taking Entresto once a day - reviewed rx with her and asked her to take this twice daily. She is feeling the best she has in a long time overall.  I gave her two weekly pill boxes for AM, afternoon and PM dosing to make things easier for her.

## 2021-01-03 NOTE — Assessment & Plan Note (Signed)
We reviewed labs from 3 weeks ago during hospitalization. VL 3500 and CD4 still preserved around 500. I suspect she may be undetectable already with the excellent adherence she describes. Will check VL for her today and let her know results.  Will have her return to see me in 3 months for repeat blood work to ensure she is well controlled on new regimen. She seems to be tolerating this well overall without side effects.

## 2021-01-06 ENCOUNTER — Ambulatory Visit (INDEPENDENT_AMBULATORY_CARE_PROVIDER_SITE_OTHER): Payer: Medicare Other | Admitting: Pharmacist

## 2021-01-06 ENCOUNTER — Other Ambulatory Visit: Payer: Self-pay

## 2021-01-06 ENCOUNTER — Telehealth: Payer: Self-pay

## 2021-01-06 VITALS — BP 120/72 | HR 86 | Wt 265.0 lb

## 2021-01-06 DIAGNOSIS — I5022 Chronic systolic (congestive) heart failure: Secondary | ICD-10-CM

## 2021-01-06 LAB — HIV-1 RNA QUANT-NO REFLEX-BLD
HIV 1 RNA Quant: <20 Copies/mL — ABNORMAL HIGH
HIV-1 RNA Quant, Log: <1.3 Log cps/mL — ABNORMAL HIGH

## 2021-01-06 MED ORDER — CARVEDILOL 6.25 MG PO TABS: 6.2500 mg | ORAL_TABLET | Freq: Two times a day (BID) | ORAL | 3 refills | Status: DC

## 2021-01-06 NOTE — Patient Instructions (Addendum)
Please increase your carvedilol to 6.25mg  twice a day. You may take 2 of the 3.125mg  twice a day to equal 6.25mg  twice a day. Until you start the 6.25mg  tablets, then it is 1 in the AM and 1 at PM.  Continue Entresto 24/26 mg twice a day, Jardiance 10mg  daily and spironolactone 12.5mg  at bedtime  Keep checking BP and weight at home and bring your log with you. If you gain 3 or more pounds over night or 5lb in a week - please take a dose of furosemide  Please also bring your blood pressure meter with you.  Call me at [phone] with any questions

## 2021-01-06 NOTE — Progress Notes (Signed)
Patient ID: Sherry Hall                 DOB: 1981/06/25                      MRN: [id number]     HPI: Sherry Hall is a 40 y.o. female referred by Dr. Gardiner Rhyme to pharmacy clinic for HF medication management. PMH is significant for CHF, DM, obesity (BMI 52) and HLD. Most recent LVEF 35-40% on 12/15/20.  Today she returns to pharmacy clinic for further medication titration. At last visit with MD her blood pressure was well controlled. Sometimes a little low. she complained of some dizziness and lightheadedness. Thought to be too dry so furosemide was changed to prn. Symptomatically, she is feeling way better. She does have some dizziness. Occurs 1-2 hr after AM meds on and off until afternoon. It is improved from when she first started [facility] and seems to be getting better. Also improved since taking spironolactone at bedtime. No fatigue at rest, only after doing a lot. No chest pain or just a few palpitations. Feels SOB when walking uphill,stairs or walking longer than 5 min. Able to complete almost all ADLs. Energy level is still a little low. Hard to do housework or cooking. Has to sit down. She does not checks her weight at home, needs to get batteries for scale. No LEE, PND, or orthopnea. Appetite has been pretty good. She tires to adheres to a low-salt diet.  Patient also complains of numbness in arms hands and feet- comes on suddenly. Only has happened since discharge. Also complains of blurred vision for 1-2 hours in the AM. This is not associated with dizziness. She checked her blood pressure when she felt dizzy last night. It was 138/83. However from discussion, it seem like her home wrist cuff runs high. Reports BP at home is highest 147/89 and lowest 120. But it sounds like cuff in 20 pt higher.  Has not need to take furosemide since it was changed to prn.  Current CHF meds: Entresto 24/26 mg twice a day, Jardiance 20m daily, spironolactone 12.566mdaily, carvedilol 3.12562mtwice a day Previously tried:  BP goal: <130/80  Family History:   Social History:  Quite smoking 12 days ago, glass of wine per week, no illicit drugs  Diet: breakfast: coffee with splenda and creamer Lunch: tur[place]wich, pesto pasta salad and water Dinner: meat in air fryer, salad (lettuce, cucumber, tomatoes, cheese w/ cauliflower chips and little bit of ranch dressing)  Exercise: can only walk for 5 min before SOB  Home BP readings:   Wt Readings from Last 3 Encounters:  01/03/21 268 lb (121.6 kg)  12/24/20 262 lb (118.8 kg)  12/23/20 262 lb (118.8 kg)   BP Readings from Last 3 Encounters:  01/03/21 139/83  12/23/20 126/84  12/17/20 117/81   Pulse Readings from Last 3 Encounters:  01/03/21 89  12/23/20 90  12/17/20 85    Renal function: Estimated Creatinine Clearance: 58 mL/min (A) (by C-G formula based on SCr of 1.53 mg/dL (H)).  Past Medical History:  Diagnosis Date   Anemia    H/O   Anxiety    Depression    Diabetes mellitus type 1 (HCC)    GERD (gastroesophageal reflux disease)    HIV (human immunodeficiency virus infection) ([facility]   Hypertension    Type 2 diabetes mellitus ([facility]   Current Outpatient Medications on File Prior to Visit  Medication  Sig Dispense Refill   Accu-Chek FastClix Lancets MISC 1 Units by Does not apply route 4 (four) times daily as needed. 102 each 12   albuterol (VENTOLIN HFA) 108 (90 Base) MCG/ACT inhaler Inhale 2 puffs into the lungs every 4 (four) hours as needed for wheezing or shortness of breath. 8 g 0   ALPRAZolam (XANAX) 1 MG tablet Take 1 mg by mouth 2 (two) times daily as needed for anxiety (panic attack).     atorvastatin (LIPITOR) 10 MG tablet Take 1 tablet (10 mg total) by mouth daily. 30 tablet 0   bictegravir-emtricitabine-tenofovir AF (BIKTARVY) 50-200-25 MG TABS tablet Take 1 tablet by mouth daily. 30 tablet 0   blood glucose meter kit and supplies KIT Dispense based on patient and insurance  preference. Use up to four times daily as directed. (FOR ICD-9 250.00, 250.01). 1 each 0   Blood Glucose Monitoring Suppl (ACCU-CHEK GUIDE ME) w/Device KIT 1 Units by Does not apply route 4 (four) times daily as needed. 1 kit 0   buPROPion (WELLBUTRIN XL) 300 MG 24 hr tablet Take 300 mg by mouth daily.     busPIRone (BUSPAR) 10 MG tablet Take 10 mg by mouth 3 (three) times daily.     carvedilol (COREG) 3.125 MG tablet Take 1 tablet (3.125 mg total) by mouth 2 (two) times daily with a meal. 60 tablet 0   empagliflozin (JARDIANCE) 10 MG TABS tablet Take 1 tablet (10 mg total) by mouth daily. 30 tablet 0   furosemide (LASIX) 40 MG tablet Take 0.5 tablets (20 mg total) by mouth daily as needed. 30 tablet 2   GLOBAL EASE INJECT PEN NEEDLES 32G X 4 MM MISC USE AS DIRECTED TO ADMINISTER INSULIN THREE TIMES DAILY BEFORE MEALS 100 each 9   glucose blood (ACCU-CHEK GUIDE) test strip Use 3 times a day. Dx code: E11.9 100 each 12   insulin aspart (NOVOLOG) 100 UNIT/ML FlexPen Per sliding scale. Max daily dose 40 units. (Patient taking differently: Inject 2-10 Units into the skin See admin instructions. Injects 2-10 units into the skin per sliding scale. Max daily dose 40 units. 150-199 mg/dl : 2 units; 200-249 mg/dL: 4 units; 250 - 299 mg/dL: 6 units; 300 - 349 mg/dL: 8 units; 350 -399 mg/ dL: 10 units) 15 mL 11   Insulin Glargine (LANTUS SOLOSTAR) 100 UNIT/ML Solostar Pen INJECT 30 UNITS INTO THE SKIN DAILY AT 10 IN THE EVENING (Patient taking differently: Inject 30 Units into the skin daily.) 15 mL 11   sertraline (ZOLOFT) 100 MG tablet Take by mouth daily.     spironolactone (ALDACTONE) 25 MG tablet Take 0.5 tablets (12.5 mg total) by mouth at bedtime. 15 tablet 0   traZODone (DESYREL) 50 MG tablet Take 50-100 mg by mouth at bedtime as needed for sleep.   1   No current facility-administered medications on file prior to visit.    Allergies  Allergen Reactions   Percocet  [Oxycodone-Acetaminophen] Hives, Itching and Rash   Latex Rash     Assessment/Plan:  1. CHF - Blood pressure in clinic today 120/72. I suspect her dizziness is just her body getting use to lower blood pressure. I will hold on increasing Entresto at this time. Hoping at next visit that dizziness will be improved and we can increase to 48/54m twice a day. Home blood pressures are much higher, probably due to possible inaccurate wrist cuff and poor technique. Proper technique reviewed. I have asked her to bring her cuff with her  to next visit. HR has plently of room. Will increase carvedilol to 6.60m twice a day. I have advised her with increase in BB to keep an eye of swelling and weight. If you gain 3 or more pounds over night or 5lb in a week - please take a dose of furosemide. Continue Jardiance 138mdaily and spironolactone 12.8m19mt bedtime. Follow up in clinic in 2 weeks. BMP today. Diet reviewed in detail and suggestions including making her own salad dressing and reading food labels were provided.  2. Numbness/tingling- sounds like it could be diabetic neuropathy. Unlikely from meds. Follow up with PCP  3. Blurred vision- Advised she see eye Dr.  4. Tobacco cessation- Patient quite smoking 12 days ago. Would like to do on her own without medications. Has quite in past. Educated about 1-800 quite now line for assistance.   Thank you,  Sherry Hall.D, BCPS, CPP [id number] StreetreMidlothianC [id number]: [phone]: [phone]

## 2021-01-06 NOTE — Telephone Encounter (Signed)
-----   Message from Blanchard Kelch, NP sent at 01/06/2021  1:48 PM EDT ----- Please call Sherry Hall to let her know that her viral load is already undetectable on her new medication. Please continue the [place] everyday. Hope she continues to feel better.

## 2021-01-06 NOTE — Telephone Encounter (Signed)
Per Judeth Cornfield, VL is undetectable. Test results relayed to patient. No further questions/concerns at this time.   Sinead Hockman Loyola Mast, RN

## 2021-01-07 ENCOUNTER — Telehealth: Payer: Self-pay | Admitting: Pharmacist

## 2021-01-07 LAB — BASIC METABOLIC PANEL
BUN/Creatinine Ratio: 15 (ref 9–23)
BUN: 23 mg/dL (ref 6–24)
CO2: 21 mmol/L (ref 20–29)
Calcium: 8.8 mg/dL (ref 8.7–10.2)
Chloride: 103 mmol/L (ref 96–106)
Creatinine, Ser: 1.51 mg/dL — ABNORMAL HIGH (ref 0.57–1.00)
Glucose: 166 mg/dL — ABNORMAL HIGH (ref 65–99)
Potassium: 4 mmol/L (ref 3.5–5.2)
Sodium: 137 mmol/L (ref 134–144)
eGFR: 44 mL/min/{1.73_m2} — ABNORMAL LOW (ref >59)

## 2021-01-07 NOTE — Telephone Encounter (Signed)
Tried to call pt to review labs. No answer. No VM set up.  Scr is stable after starting Entresto. [place] can also cause a decline initially, which stabilizes over 4 weeks. Although there is a decline, SGLT2 are also renally protective and provide an overall reduction in adverse kidney disease. I would recommend continuing Entresto 24/26mg  twice a day, Jardiance 10mg , spironolactone 12.5mg  and carvedilol 6.25mg  twice a day.

## 2021-01-15 ENCOUNTER — Telehealth: Payer: Self-pay | Admitting: Cardiovascular Disease

## 2021-01-15 DIAGNOSIS — E785 Hyperlipidemia, unspecified: Secondary | ICD-10-CM

## 2021-01-15 MED ORDER — ATORVASTATIN CALCIUM 10 MG PO TABS: 10.0000 mg | ORAL_TABLET | Freq: Every day | ORAL | 1 refills | Status: DC

## 2021-01-15 NOTE — Telephone Encounter (Signed)
Patient calling the office for samples of medication:   1.  What medication and dosage are you requesting samples for?  Entresto  2.  Are you currently out of this medication?  Yes, patient states she took her last tablet last night

## 2021-01-15 NOTE — Telephone Encounter (Signed)
Entresto 24-26 mg confirmed with patient #1 Lot [id number] exp 4/24 Patient aware at front desk for pick up

## 2021-01-20 ENCOUNTER — Telehealth: Payer: Self-pay

## 2021-01-20 DIAGNOSIS — B20 Human immunodeficiency virus [HIV] disease: Secondary | ICD-10-CM

## 2021-01-20 MED ORDER — BIKTARVY 50-200-25 MG PO TABS: 1.0000 | ORAL_TABLET | Freq: Every day | ORAL | 2 refills | Status: DC

## 2021-01-20 NOTE — Telephone Encounter (Signed)
Patient called requesting refill for Sherry Hall, states she uses [facility]. RN confirmed refill is appropriate with Rexene Alberts, NP. RN will send [place] to [facility].   Sandie Ano, RN

## 2021-01-21 ENCOUNTER — Other Ambulatory Visit: Payer: Self-pay | Admitting: Cardiovascular Disease

## 2021-01-21 ENCOUNTER — Ambulatory Visit: Payer: Medicare Other | Admitting: Family Medicine

## 2021-01-23 ENCOUNTER — Ambulatory Visit (INDEPENDENT_AMBULATORY_CARE_PROVIDER_SITE_OTHER): Payer: Medicare Other | Admitting: Pharmacist

## 2021-01-23 ENCOUNTER — Other Ambulatory Visit: Payer: Self-pay

## 2021-01-23 VITALS — BP 108/70 | HR 85

## 2021-01-23 DIAGNOSIS — I5022 Chronic systolic (congestive) heart failure: Secondary | ICD-10-CM | POA: Diagnosis not present

## 2021-01-23 MED ORDER — ENTRESTO 49-51 MG PO TABS: 1.0000 | ORAL_TABLET | Freq: Two times a day (BID) | ORAL | 3 refills | Status: DC

## 2021-01-23 NOTE — Patient Instructions (Addendum)
Your blood pressure goal if <130/80 However, I would not want your blood pressure to run consistently <90/50  Please increase your Entresto to 49/51mg  twice a day. You may take 2 of the 24/26mg  tablets twice a day until you run out  Continue Jardiance 10mg  daily, spironolactone 12.5mg  daily and carvedilol 6.25mg  twice a day  Call me at [phone] with any questions  Keep checking your blood pressure at home with your LEFT arm

## 2021-01-23 NOTE — Progress Notes (Addendum)
Patient ID: Sherry Hall                 DOB: October 26, 1980                      MRN: [id number]     HPI: Sherry Hall is a 40 y.o. female referred by Dr. Audie Box to pharmacy clinic for HF medication management. PMH is significant for CHF, DM, obesity (BMI 52) and HLD. Most recent LVEF 35-40% on 12/15/20.  Today she returns to pharmacy clinic for further medication titration. At last visit her blood pressure was well controlled. She complained of some dizziness after taking her Delene Loll but it is improving. Carvedilol was increased to 6.54m twice a day. Her home wrist cuff seems to be inaccurate per her account. Technique was also incorrect. I asked her to bring it with her to next visit. BMP was stable at last visit.  Patient presents today to clinic for follow up. She brings in her home wrist cuff. She had been using her right arm to check blood pressure. Compared to clinic readings using her left arm for both manual and home wrist cuff 108/70 (clinic) vs 110/71 (home cuff). She stated her dizziness and breathing are a lot better. However, she complains of a lot of fatigue. Tired all day- has to force herself to stay busy and not nap. She is still smoking but only a cigarette here or there.  Symptomatically, she is feeling way better. She does have blurred vision in the AM for about an hour and then occurs again after her medications. No chest pain or just a few palpitations. Feels SOB when walking uphill,stairs but breathing is much better. Able to complete almost all ADLs. Energy level is still low. Hard to do housework or cooking. Has to sit down. She has been checking her weight. Stable around 266lb. No LEE, PND, or orthopnea. Appetite has been pretty good. She tires to adheres to a low-salt diet. Has not needed furosemide.  Patient also complains of numbness in arms hands and feet- comes on suddenly.  Current CHF meds: Entresto 24/26 mg twice a day, Jardiance 1364mdaily, spironolactone  12.64m63maily, carvedilol 6.264m73mice a day Previously tried:  BP goal: <130/80  Family History:  Family History  Problem Relation Age of Onset  . Diabetes Father   . Hypertension Father   . Cancer Mother        cervical      Social History:  Trying to quite smoking- cigarette here or there, glass of wine per week, no illicit drugs  Diet: breakfast: coffee with splenda and creamer Lunch: turkKuwaitdwich, pesto pasta salad and water Dinner: meat in air fryer, salad (lettuce, cucumber, tomatoes, cheese w/ cauliflower chips and little bit of ranch dressing)  Exercise: can only walk for 5 min before SOB  Home BP readings: 128/79, 134/83, 114/76, 134/86, 131/83,137/80, 132/82, 136/83, 137/97, 140/86, 111/67, 134/89, 127/79,125/85 HR 80's-103  Wt Readings from Last 3 Encounters:  01/06/21 265 lb (120.2 kg)  01/03/21 268 lb (121.6 kg)  12/24/20 262 lb (118.8 kg)   BP Readings from Last 3 Encounters:  01/06/21 120/72  01/03/21 139/83  12/23/20 126/84   Pulse Readings from Last 3 Encounters:  01/06/21 86  01/03/21 89  12/23/20 90    Renal function: CrCl cannot be calculated (Unknown ideal weight.).  Past Medical History:  Diagnosis Date  . Anemia    H/O  . Anxiety   . Depression   .  Diabetes mellitus type 1 ([place])   . GERD (gastroesophageal reflux disease)   . HIV (human immunodeficiency virus infection) ([place]) 2011  . Hypertension   . Type 2 diabetes mellitus ([place])     Current Outpatient Medications on File Prior to Visit  Medication Sig Dispense Refill  . Accu-Chek FastClix Lancets MISC 1 Units by Does not apply route 4 (four) times daily as needed. 102 each 12  . albuterol (VENTOLIN HFA) 108 (90 Base) MCG/ACT inhaler Inhale 2 puffs into the lungs every 4 (four) hours as needed for wheezing or shortness of breath. 8 g 0  . ALPRAZolam (XANAX) 1 MG tablet Take 1 mg by mouth 2 (two) times daily as needed for anxiety (panic attack).    Marland Kitchen atorvastatin (LIPITOR) 10 MG  tablet Take 1 tablet (10 mg total) by mouth daily. 90 tablet 1  . atorvastatin (LIPITOR) 10 MG tablet TAKE 1 TABLET (10 MG TOTAL) BY MOUTH DAILY. 30 tablet 0  . atorvastatin (LIPITOR) 10 MG tablet TAKE 1 TABLET (10 MG TOTAL) BY MOUTH DAILY. 30 tablet 0  . bictegravir-emtricitabine-tenofovir AF (BIKTARVY) 50-200-25 MG TABS tablet Take 1 tablet by mouth daily. 30 tablet 2  . bictegravir-emtricitabine-tenofovir AF (BIKTARVY) 50-200-25 MG TABS tablet TAKE 1 TABLET BY MOUTH DAILY. 30 tablet 0  . bictegravir-emtricitabine-tenofovir AF (BIKTARVY) 50-200-25 MG TABS tablet TAKE 1 TABLET BY MOUTH DAILY. 30 tablet 0  . blood glucose meter kit and supplies KIT Dispense based on patient and insurance preference. Use up to four times daily as directed. (FOR ICD-9 250.00, 250.01). 1 each 0  . Blood Glucose Monitoring Suppl (ACCU-CHEK GUIDE ME) w/Device KIT 1 Units by Does not apply route 4 (four) times daily as needed. 1 kit 0  . buPROPion (WELLBUTRIN XL) 300 MG 24 hr tablet Take 300 mg by mouth daily.    . busPIRone (BUSPAR) 10 MG tablet Take 10 mg by mouth 3 (three) times daily.    . carvedilol (COREG) 3.125 MG tablet TAKE 1 TABLET (3.125 MG TOTAL) BY MOUTH TWO TIMES DAILY WITH A MEAL. 60 tablet 0  . carvedilol (COREG) 6.25 MG tablet Take 1 tablet (6.25 mg total) by mouth 2 (two) times daily. 180 tablet 3  . empagliflozin (JARDIANCE) 10 MG TABS tablet TAKE 1 TABLET (10 MG TOTAL) BY MOUTH DAILY. 30 tablet 0  . furosemide (LASIX) 40 MG tablet Take 0.5 tablets (20 mg total) by mouth daily as needed. 30 tablet 2  . furosemide (LASIX) 40 MG tablet TAKE 1/2 TABLET (20 MG TOTAL) BY MOUTH DAILY. 30 tablet 0  . GLOBAL EASE INJECT PEN NEEDLES 32G X 4 MM MISC USE AS DIRECTED TO ADMINISTER INSULIN THREE TIMES DAILY BEFORE MEALS 100 each 9  . glucose blood (ACCU-CHEK GUIDE) test strip Use 3 times a day. Dx code: E11.9 100 each 12  . insulin aspart (NOVOLOG) 100 UNIT/ML FlexPen Per sliding scale. Max daily dose 40 units.  (Patient taking differently: Inject 2-10 Units into the skin See admin instructions. Injects 2-10 units into the skin per sliding scale. Max daily dose 40 units. 150-199 mg/dl : 2 units; 200-249 mg/dL: 4 units; 250 - 299 mg/dL: 6 units; 300 - 349 mg/dL: 8 units; 350 -399 mg/ dL: 10 units) 15 mL 11  . Insulin Glargine (LANTUS SOLOSTAR) 100 UNIT/ML Solostar Pen INJECT 30 UNITS INTO THE SKIN DAILY AT 10 IN THE EVENING (Patient taking differently: Inject 30 Units into the skin daily.) 15 mL 11  . JARDIANCE 10 MG TABS tablet TAKE  ONE TABLET BY MOUTH DAILY 30 tablet 0  . lisinopril (ZESTRIL) 5 MG tablet TAKE 1 TABLET (5 MG TOTAL) BY MOUTH DAILY. 30 tablet 0  . sacubitril-valsartan (ENTRESTO) 24-26 MG Take 1 tablet by mouth 2 (two) times daily.    . sertraline (ZOLOFT) 100 MG tablet Take by mouth daily.    Marland Kitchen spironolactone (ALDACTONE) 25 MG tablet TAKE HALF TABLET BY MOUTH AT BEDTIME 15 tablet 0  . spironolactone (ALDACTONE) 25 MG tablet TAKE 1/2 TABLET (12.5 MG TOTAL) BY MOUTH DAILY. 15 tablet 0  . traZODone (DESYREL) 50 MG tablet Take 50-100 mg by mouth at bedtime as needed for sleep.   1   No current facility-administered medications on file prior to visit.    Allergies  Allergen Reactions  . Percocet [Oxycodone-Acetaminophen] Hives, Itching and Rash  . Latex Rash     Assessment/Plan:  1. CHF - Blood pressure in clinic 108/71. Will increase Entresto to 49/85m twice a day. Continue carvedilol 6.239mtwice a day, Jardiance 1029maily and spironolactone 12.5mg41m bedtime. Her increased fatigue could be from increased dose of carvedilol at last appointment. Its been about 2 weeks. This should start to improve. Continue checking BP on left arm. Log sheet given along with DASH diet handout. Follow up in clinic in 3 weeks. Recheck BMP at that visit.  2. Numbness/tingling- sounds like it could be diabetic neuropathy. Unlikely from meds. Advised again to follow up with PCP  3. Blurred vision- Advised  she see eye Dr.  4. Tobacco cessation- Encouraged patient to quite completely.Would like to do on her own without medications. Has quite in past. Educated about 1-800 quite now line for assistance.   Thank you,  MeliRamond Dialarm.D, BCPS, CPP [id number] [place] [phone]: [phone]: [phone]

## 2021-01-26 NOTE — Progress Notes (Signed)
Cardiology Office Note:   Date:  01/28/2021  NAME:  Sherry Hall    MRN: [id number] DOB:  Apr 13, 1981   PCP:  Dorena Dew, FNP  Cardiologist:  No primary care provider on file.  Electrophysiologist:  None   Referring MD: Dorena Dew, FNP   Chief Complaint  Patient presents with  . Congestive Heart Failure        History of Present Illness:   Sherry Hall is a 40 y.o. female with a hx of HTN, HLD, DM, CHF who presents for follow-up. Needs up titration of HF medications.  She seems to be doing well.  Weights are stable.  She has been working with pharmacy for further titration of [employer].  She is only taking Lasix as needed.  BP 124/76.  Denies any chest pain or shortness of breath.  Overall doing really well.  A1c down to 8.2.  Doing really well.  We discussed increasing her Coreg.  She has plans to increase her Entresto next week.  Will need a repeat echocardiogram in 3 months.  Problem List 1. Systolic HF -40-98% 11/06/1476 -0 CAC on CT PE 12/11/2020 -dx in setting of uncontrolled HTN 2. Diabetes -A1c 8.2 3. Obesity -BMI 52 4. HLD -T chol 235, HDL 51, LDL 147, TG 230 5. HTN  Past Medical History: Past Medical History:  Diagnosis Date  . Anemia    H/O  . Anxiety   . Depression   . Diabetes mellitus type 1 ([place])   . GERD (gastroesophageal reflux disease)   . HIV (human immunodeficiency virus infection) ([place]) 2011  . Hypertension   . Type 2 diabetes mellitus ([place])     Past Surgical History: Past Surgical History:  Procedure Laterality Date  . CESAREAN SECTION  2001, 2008, 2009, 2011  . CHOLECYSTECTOMY    . EXCISION OF BREAST LESION N/A 01/04/2018   Procedure: EXCISION OF CHEST WALL ABSCESS. MIDDLE CHEST;  Surgeon: Olean Ree, MD;  Location: [facility] ORS;  Service: General;  Laterality: N/A;  resection of chest wall cyst / abscess  . FRACTURE SURGERY Right    HAND  . TONSILLECTOMY    . TUBAL LIGATION  2011    Current  Medications: Current Meds  Medication Sig  . Accu-Chek FastClix Lancets MISC 1 Units by Does not apply route 4 (four) times daily as needed.  Marland Kitchen albuterol (VENTOLIN HFA) 108 (90 Base) MCG/ACT inhaler Inhale 2 puffs into the lungs every 4 (four) hours as needed for wheezing or shortness of breath.  . ALPRAZolam (XANAX) 1 MG tablet Take 1 mg by mouth 2 (two) times daily as needed for anxiety (panic attack).  Marland Kitchen atorvastatin (LIPITOR) 10 MG tablet Take 1 tablet (10 mg total) by mouth daily.  . bictegravir-emtricitabine-tenofovir AF (BIKTARVY) 50-200-25 MG TABS tablet Take 1 tablet by mouth daily.  . blood glucose meter kit and supplies KIT Dispense based on patient and insurance preference. Use up to four times daily as directed. (FOR ICD-9 250.00, 250.01).  . Blood Glucose Monitoring Suppl (ACCU-CHEK GUIDE ME) w/Device KIT 1 Units by Does not apply route 4 (four) times daily as needed.  Marland Kitchen buPROPion (WELLBUTRIN XL) 300 MG 24 hr tablet Take 300 mg by mouth daily.  . busPIRone (BUSPAR) 10 MG tablet Take 10 mg by mouth 3 (three) times daily.  . furosemide (LASIX) 40 MG tablet Take 0.5 tablets (20 mg total) by mouth daily as needed.  Marland Kitchen GLOBAL EASE INJECT PEN NEEDLES 32G X 4 MM MISC USE AS  DIRECTED TO ADMINISTER INSULIN THREE TIMES DAILY BEFORE MEALS  . glucose blood (ACCU-CHEK GUIDE) test strip Use 3 times a day. Dx code: E11.9  . insulin aspart (NOVOLOG) 100 UNIT/ML FlexPen Per sliding scale. Max daily dose 40 units. (Patient taking differently: Inject 2-10 Units into the skin See admin instructions. Injects 2-10 units into the skin per sliding scale. Max daily dose 40 units. 150-199 mg/dl : 2 units; 200-249 mg/dL: 4 units; 250 - 299 mg/dL: 6 units; 300 - 349 mg/dL: 8 units; 350 -399 mg/ dL: 10 units)  . Insulin Glargine (LANTUS SOLOSTAR) 100 UNIT/ML Solostar Pen INJECT 30 UNITS INTO THE SKIN DAILY AT 10 IN THE EVENING (Patient taking differently: Inject 30 Units into the skin daily.)  . JARDIANCE 10 MG  TABS tablet TAKE ONE TABLET BY MOUTH DAILY  . sacubitril-valsartan (ENTRESTO) 49-51 MG Take 1 tablet by mouth 2 (two) times daily.  . sertraline (ZOLOFT) 100 MG tablet Take by mouth daily.  Marland Kitchen spironolactone (ALDACTONE) 25 MG tablet TAKE HALF TABLET BY MOUTH AT BEDTIME  . traZODone (DESYREL) 50 MG tablet Take 50-100 mg by mouth at bedtime as needed for sleep.   . [DISCONTINUED] carvedilol (COREG) 6.25 MG tablet Take 1 tablet (6.25 mg total) by mouth 2 (two) times daily.     Allergies:    Percocet [oxycodone-acetaminophen] and Latex   Social History: Social History   Socioeconomic History  . Marital status: Single    Spouse name: Not on file  . Number of children: Not on file  . Years of education: Not on file  . Highest education level: Not on file  Occupational History  . Not on file  Tobacco Use  . Smoking status: Current Every Day Smoker    Packs/day: 0.50    Years: 15.00    Pack years: 7.50    Types: Cigarettes  . Smokeless tobacco: Never Used  . Tobacco comment: trying to slow  Vaping Use  . Vaping Use: Never used  Substance and Sexual Activity  . Alcohol use: Yes    Alcohol/week: 1.0 standard drink    Types: 1 Standard drinks or equivalent per week    Comment: socially  . Drug use: No  . Sexual activity: Yes    Partners: Male    Birth control/protection: Surgical    Comment: declined condoms 12/2020  Other Topics Concern  . Not on file  Social History Narrative  . Not on file   Social Determinants of Health   Financial Resource Strain: Not on file  Food Insecurity: Not on file  Transportation Needs: Not on file  Physical Activity: Not on file  Stress: Not on file  Social Connections: Not on file     Family History: The patient's family history includes Cancer in her mother; Diabetes in her father; Hypertension in her father.  ROS:   All other ROS reviewed and negative. Pertinent positives noted in the HPI.     EKGs/Labs/Other Studies Reviewed:   The  following studies were personally reviewed by me today:  TTE 12/15/2020 1. Left ventricular ejection fraction, by estimation, is 35 to 40%. The  left ventricle has moderately decreased function. The left ventricle  demonstrates global hypokinesis. Left ventricular diastolic parameters are  consistent with Grade I diastolic  dysfunction (impaired relaxation).  2. Right ventricular systolic function is normal. The right ventricular  size is normal.  3. The aortic valve was not well visualized.  4. Limited echocardiogram   Recent Labs: 12/18/2020: Magnesium 2.3 12/23/2020:  BNP 82.8 01/27/2021: ALT 10; BUN 18; Creatinine, Ser 1.33; Hemoglobin 13.1; Platelets 231; Potassium 4.3; Sodium 136   Recent Lipid Panel    Component Value Date/Time   CHOL 235 (H) 12/26/2018 1209   TRIG 230 (H) 12/26/2018 1209   HDL 51 12/26/2018 1209   CHOLHDL 4.6 12/26/2018 1209   VLDL 23 11/23/2016 1045   LDLCALC 147 (H) 12/26/2018 1209    Physical Exam:   VS:  BP 124/76 (BP Location: Left Arm, Patient Position: Sitting)   Pulse 82   Ht 5' (1.524 m)   Wt 266 lb 9.6 oz (120.9 kg)   SpO2 100%   BMI 52.07 kg/m    Wt Readings from Last 3 Encounters:  01/28/21 266 lb 9.6 oz (120.9 kg)  01/06/21 265 lb (120.2 kg)  01/03/21 268 lb (121.6 kg)    General: Well nourished, well developed, in no acute distress Head: Atraumatic, normal size  Eyes: PEERLA, EOMI  Neck: Supple, no JVD Endocrine: No thryomegaly Cardiac: Normal S1, S2; RRR; no murmurs, rubs, or gallops Lungs: Clear to auscultation bilaterally, no wheezing, rhonchi or rales  Abd: Soft, nontender, no hepatomegaly  Ext: No edema, pulses 2+ Musculoskeletal: No deformities, BUE and BLE strength normal and equal Skin: Warm and dry, no rashes   Neuro: Alert and oriented to person, place, time, and situation, CNII-XII grossly intact, no focal deficits  Psych: Normal mood and affect   ASSESSMENT:   Sherry Hall is a 40 y.o. female who presents  for the following: 1. Chronic systolic congestive heart failure ([place])   2. Primary hypertension   3. Hyperlipidemia LDL goal <100   4. Obesity, morbid, BMI 50 or higher ([place])     PLAN:   1. Chronic systolic congestive heart failure (HCC) -EF 35 to 40%.  Diagnosed in the setting of uncontrolled hypertension and diabetes.  On optimal medical therapy.  We will continue with further titration.  Increase Coreg to 12.5 mg twice daily.  She is on Entresto 49-51 mg twice daily.  Plans for titration next week up to 97-103.  Continue Aldactone 12.5 mg daily.  She is on Jardiance 10 mg daily.  Kidney function stable.  Continue current medications. -She was diagnosed with normal troponins and a nonischemic EKG.  Overall I feel this is a nonischemic cardiomyopathy in the setting of hypertension, diabetes and likely HIV.  We will plan to repeat an echocardiogram in 3 months.  I would like to defer ischemia evaluation until we see what her EF does.  I expected to improve.  She has no symptoms of angina.  2. Primary hypertension -At goal.  Continue medications as above.  3. Hyperlipidemia LDL goal <100 -Continue Lipitor 10 mg daily.  She will need a repeat check on her lipid profile at some point.  4. Obesity, morbid, BMI 50 or higher ([place]) -Referral to healthy weight and wellness.  I think she does merit bariatric surgery.  She should start to work towards this goal.  Disposition: Return in about 3 months (around 04/29/2021).  Medication Adjustments/Labs and Tests Ordered: Current medicines are reviewed at length with the patient today.  Concerns regarding medicines are outlined above.  Orders Placed This Encounter  Procedures  . Amb Ref to Medical Weight Management  . ECHOCARDIOGRAM COMPLETE   Meds ordered this encounter  Medications  . carvedilol (COREG) 6.25 MG tablet    Sig: Take 2 tablets (12.5 mg total) by mouth 2 (two) times daily.    Dispense:  180  tablet    Refill:  3    Patient  Instructions  Medication Instructions:  Increase Coreg 12.5 twice daily   *If you need a refill on your cardiac medications before your next appointment, please call your pharmacy*   Testing/Procedures: Echocardiogram (before appointment in 3 months) - Your physician has requested that you have an echocardiogram. Echocardiography is a painless test that uses sound waves to create images of your heart. It provides your doctor with information about the size and shape of your heart and how well your heart's chambers and valves are working. This procedure takes approximately one hour. There are no restrictions for this procedure. This will be performed at our [facility] location - [address], [address].    Follow-Up: At [facility], you and your health needs are our priority.  As part of our continuing mission to provide you with exceptional heart care, we have created designated Provider Care Teams.  These Care Teams include your primary Cardiologist (physician) and Advanced Practice Providers (APPs -  Physician Assistants and Nurse Practitioners) who all work together to provide you with the care you need, when you need it.  We recommend signing up for the patient portal called "MyChart".  Sign up information is provided on this After Visit Summary.  MyChart is used to connect with patients for Virtual Visits (Telemedicine).  Patients are able to view lab/test results, encounter notes, upcoming appointments, etc.  Non-urgent messages can be sent to your provider as well.   To learn more about what you can do with MyChart, go to [email or web address].    Your next appointment:   3 month(s)  The format for your next appointment:   In Person  Provider:   Sande Rives, PA-C       Time Spent with Patient: I have spent a total of 25 minutes with patient reviewing hospital notes, telemetry, EKGs, labs and examining the patient as well as establishing an assessment and plan that  was discussed with the patient.  > 50% of time was spent in direct patient care.  Signed, Addison Naegeli. Audie Box, MD, [place]  [address], [place], [place] [address] [phone]  01/28/2021 12:04 PM

## 2021-01-27 ENCOUNTER — Ambulatory Visit: Payer: Medicare Other | Admitting: Family Medicine

## 2021-01-27 ENCOUNTER — Telehealth: Payer: Self-pay

## 2021-01-27 ENCOUNTER — Other Ambulatory Visit: Payer: Self-pay

## 2021-01-27 ENCOUNTER — Other Ambulatory Visit: Payer: Self-pay | Admitting: Family Medicine

## 2021-01-27 ENCOUNTER — Other Ambulatory Visit: Payer: Medicare Other

## 2021-01-27 VITALS — Temp 98.0°F

## 2021-01-27 DIAGNOSIS — N898 Other specified noninflammatory disorders of vagina: Secondary | ICD-10-CM

## 2021-01-27 DIAGNOSIS — I10 Essential (primary) hypertension: Secondary | ICD-10-CM

## 2021-01-27 DIAGNOSIS — E118 Type 2 diabetes mellitus with unspecified complications: Secondary | ICD-10-CM

## 2021-01-27 DIAGNOSIS — B9689 Other specified bacterial agents as the cause of diseases classified elsewhere: Secondary | ICD-10-CM

## 2021-01-27 DIAGNOSIS — Z794 Long term (current) use of insulin: Secondary | ICD-10-CM

## 2021-01-27 NOTE — Telephone Encounter (Signed)
Pt needs a call back cause she thinks she has a yeast infection.

## 2021-01-27 NOTE — Telephone Encounter (Signed)
Spoke w/ pt she  having a lot of itching, vaginal discharge  and wanted to to if she can have do self swab. Informed patient that she could come to do an self swab.

## 2021-01-28 ENCOUNTER — Ambulatory Visit (INDEPENDENT_AMBULATORY_CARE_PROVIDER_SITE_OTHER): Payer: Medicare Other | Admitting: Cardiovascular Disease

## 2021-01-28 ENCOUNTER — Telehealth: Payer: Self-pay | Admitting: Pharmacist

## 2021-01-28 ENCOUNTER — Encounter: Payer: Self-pay | Admitting: Cardiovascular Disease

## 2021-01-28 VITALS — BP 124/76 | HR 82 | Ht 60.0 in | Wt 266.6 lb

## 2021-01-28 DIAGNOSIS — I5022 Chronic systolic (congestive) heart failure: Secondary | ICD-10-CM | POA: Diagnosis not present

## 2021-01-28 DIAGNOSIS — I10 Essential (primary) hypertension: Secondary | ICD-10-CM

## 2021-01-28 DIAGNOSIS — E785 Hyperlipidemia, unspecified: Secondary | ICD-10-CM | POA: Diagnosis not present

## 2021-01-28 LAB — COMPREHENSIVE METABOLIC PANEL
ALT: 10 IU/L (ref 0–32)
AST: 7 IU/L (ref 0–40)
Albumin/Globulin Ratio: 0.9 — ABNORMAL LOW (ref 1.2–2.2)
Albumin: 3.7 g/dL — ABNORMAL LOW (ref 3.8–4.8)
Alkaline Phosphatase: 67 IU/L (ref 44–121)
BUN/Creatinine Ratio: 14 (ref 9–23)
BUN: 18 mg/dL (ref 6–24)
Bilirubin Total: 0.2 mg/dL (ref 0.0–1.2)
CO2: 19 mmol/L — ABNORMAL LOW (ref 20–29)
Calcium: 8.7 mg/dL (ref 8.7–10.2)
Chloride: 102 mmol/L (ref 96–106)
Creatinine, Ser: 1.33 mg/dL — ABNORMAL HIGH (ref 0.57–1.00)
Globulin, Total: 4.2 g/dL (ref 1.5–4.5)
Glucose: 241 mg/dL — ABNORMAL HIGH (ref 65–99)
Potassium: 4.3 mmol/L (ref 3.5–5.2)
Sodium: 136 mmol/L (ref 134–144)
Total Protein: 7.9 g/dL (ref 6.0–8.5)
eGFR: 52 mL/min/{1.73_m2} — ABNORMAL LOW (ref >59)

## 2021-01-28 LAB — CBC WITH DIFFERENTIAL/PLATELET
Basophils Absolute: 0.1 10*3/uL (ref 0.0–0.2)
Basos: 1 %
EOS (ABSOLUTE): 0.2 10*3/uL (ref 0.0–0.4)
Eos: 4 %
Hematocrit: 39.5 % (ref 34.0–46.6)
Hemoglobin: 13.1 g/dL (ref 11.1–15.9)
Immature Grans (Abs): 0 10*3/uL (ref 0.0–0.1)
Immature Granulocytes: 0 %
Lymphocytes Absolute: 2.5 10*3/uL (ref 0.7–3.1)
Lymphs: 38 %
MCH: 31.3 pg (ref 26.6–33.0)
MCHC: 33.2 g/dL (ref 31.5–35.7)
MCV: 95 fL (ref 79–97)
Monocytes Absolute: 0.3 10*3/uL (ref 0.1–0.9)
Monocytes: 5 %
Neutrophils Absolute: 3.5 10*3/uL (ref 1.4–7.0)
Neutrophils: 52 %
Platelets: 231 10*3/uL (ref 150–450)
RBC: 4.18 x10E6/uL (ref 3.77–5.28)
RDW: 13.6 % (ref 11.7–15.4)
WBC: 6.7 10*3/uL (ref 3.4–10.8)

## 2021-01-28 LAB — HEMOGLOBIN A1C
Est. average glucose Bld gHb Est-mCnc: 189 mg/dL
Hgb A1c MFr Bld: 8.2 % — ABNORMAL HIGH (ref 4.8–5.6)

## 2021-01-28 MED ORDER — CARVEDILOL 6.25 MG PO TABS: 12.5000 mg | ORAL_TABLET | Freq: Two times a day (BID) | ORAL | 3 refills | Status: DC

## 2021-01-28 NOTE — Telephone Encounter (Signed)
Called [facility] to see if we could add on a vit b12 level to labs drawn yesterday. They have ordered add on test and will let me know if there is an issue with it. Wanting to add on vit b12 as pt has complained of tingling and numbness.

## 2021-01-28 NOTE — Progress Notes (Signed)
Pt come  Wanted to swab for a possible yeast infection, per [place] . Okay sent out lab nu swab for pt.

## 2021-01-28 NOTE — Patient Instructions (Addendum)
Medication Instructions:  Increase Coreg 12.5 twice daily   *If you need a refill on your cardiac medications before your next appointment, please call your pharmacy*   Testing/Procedures: Echocardiogram (before appointment in 3 months) - Your physician has requested that you have an echocardiogram. Echocardiography is a painless test that uses sound waves to create images of your heart. It provides your doctor with information about the size and shape of your heart and how well your heart's chambers and valves are working. This procedure takes approximately one hour. There are no restrictions for this procedure. This will be performed at our [facility] location - [address].    Follow-Up: At [facility], you and your health needs are our priority.  As part of our continuing mission to provide you with exceptional heart care, we have created designated Provider Care Teams.  These Care Teams include your primary Cardiologist (physician) and Advanced Practice Providers (APPs -  Physician Assistants and Nurse Practitioners) who all work together to provide you with the care you need, when you need it.  We recommend signing up for the patient portal called "MyChart".  Sign up information is provided on this After Visit Summary.  MyChart is used to connect with patients for Virtual Visits (Telemedicine).  Patients are able to view lab/test results, encounter notes, upcoming appointments, etc.  Non-urgent messages can be sent to your provider as well.   To learn more about what you can do with MyChart, go to [email or web address].    Your next appointment:   3 month(s)  The format for your next appointment:   In Person  Provider:   Marjie Skiff, PA-C

## 2021-01-29 ENCOUNTER — Other Ambulatory Visit: Payer: Self-pay | Admitting: Family Medicine

## 2021-01-29 DIAGNOSIS — B3731 Acute candidiasis of vulva and vagina: Secondary | ICD-10-CM

## 2021-01-29 DIAGNOSIS — B373 Candidiasis of vulva and vagina: Secondary | ICD-10-CM

## 2021-01-29 LAB — VAGINITIS/VAGINOSIS, DNA PROBE

## 2021-01-29 MED ORDER — FLUCONAZOLE 150 MG PO TABS: 150.0000 mg | ORAL_TABLET | Freq: Once | ORAL | 1 refills | Status: AC

## 2021-01-29 NOTE — Progress Notes (Signed)
Meds ordered this encounter  Medications  . fluconazole (DIFLUCAN) 150 MG tablet    Sig: Take 1 tablet (150 mg total) by mouth once for 1 dose.    Dispense:  1 tablet    Refill:  1    Order Specific Question:   Supervising Provider    Answer:   Quentin Angst [[id number]]     Nolon Nations  APRN, MSN, FNP-C [facility] [address] [place], [place] [address] [phone]

## 2021-01-30 ENCOUNTER — Telehealth: Payer: Self-pay

## 2021-01-30 NOTE — Telephone Encounter (Signed)
Patient aware of results and recommendations. °

## 2021-01-30 NOTE — Telephone Encounter (Signed)
Pt is called in and said that someone call her about test results

## 2021-01-31 NOTE — Telephone Encounter (Signed)
B12 normal at 501. Pt aware of results and advised to contact her PCP for follow up if numbness/tingling continues or becomes bothersome.

## 2021-02-03 ENCOUNTER — Encounter (HOSPITAL_BASED_OUTPATIENT_CLINIC_OR_DEPARTMENT_OTHER): Payer: Medicare Other | Admitting: Cardiovascular Disease

## 2021-02-04 LAB — VAGINITIS/VAGINOSIS, DNA PROBE

## 2021-02-11 ENCOUNTER — Other Ambulatory Visit: Payer: Self-pay

## 2021-02-11 ENCOUNTER — Ambulatory Visit (HOSPITAL_BASED_OUTPATIENT_CLINIC_OR_DEPARTMENT_OTHER): Payer: Medicare Other | Attending: Cardiovascular Disease | Admitting: Cardiovascular Disease

## 2021-02-11 DIAGNOSIS — R0683 Snoring: Secondary | ICD-10-CM

## 2021-02-11 DIAGNOSIS — G4733 Obstructive sleep apnea (adult) (pediatric): Secondary | ICD-10-CM | POA: Diagnosis not present

## 2021-02-13 ENCOUNTER — Other Ambulatory Visit: Payer: Self-pay

## 2021-02-13 ENCOUNTER — Ambulatory Visit (INDEPENDENT_AMBULATORY_CARE_PROVIDER_SITE_OTHER): Payer: Medicare Other | Admitting: Pharmacist

## 2021-02-13 VITALS — BP 116/74 | HR 85

## 2021-02-13 DIAGNOSIS — I5022 Chronic systolic (congestive) heart failure: Secondary | ICD-10-CM

## 2021-02-13 LAB — BASIC METABOLIC PANEL
BUN/Creatinine Ratio: 13 (ref 9–23)
BUN: 17 mg/dL (ref 6–24)
CO2: 23 mmol/L (ref 20–29)
Calcium: 8.7 mg/dL (ref 8.7–10.2)
Chloride: 103 mmol/L (ref 96–106)
Creatinine, Ser: 1.26 mg/dL — ABNORMAL HIGH (ref 0.57–1.00)
Glucose: 212 mg/dL — ABNORMAL HIGH (ref 65–99)
Potassium: 4.9 mmol/L (ref 3.5–5.2)
Sodium: 136 mmol/L (ref 134–144)
eGFR: 55 mL/min/{1.73_m2} — ABNORMAL LOW (ref >59)

## 2021-02-13 NOTE — Progress Notes (Signed)
Patient ID: Sherry Hall                 DOB: 1981-03-31                      MRN: [id number]     HPI: Sherry Hall is a 40 y.o. female referred by Dr. Audie Box to pharmacy clinic for HF medication management. PMH is significant for CHF, DM, obesity (BMI 52) and HLD. Most recent LVEF 35-40% on 12/15/20.  Today she returns to pharmacy clinic for further medication titration. At last visit her blood pressure was well controlled. She complained of some fatigue after carvedilol was increased to 6.761m twice a day. Entresto was increased to 49/540mBID. Home blood pressure cuff was found accurate. She saw Dr. O'Audie Boxn 4/12. He increased her carvedilol to 12.61m82mID and told her to increase her Entresto to 97/103 the following week.   Patient presents today to clinic for follow up. She states she does have dizziness when bending over than last a few min. No SOB. However, she complains of a lot of fatigue. Tired all day- has to force herself to stay busy and not nap. She is still smoking but only a cigarette here or there. Suffers from depression and PTSD- seeing a therapist. Sticking with it this time. Her son keeps getting in trouble at school. On the waiting list to speak with a counselor.  Symptomatically, she is feeling way better. Able to complete almost all ADLs. Energy level is still low. Only sleeps 4-5 hours per night. Has trouble falling asleep and then only stays a sleep 4-5 hours. No LEE, PND, or orthopnea. Appetite has been pretty good. She tires to adheres to a low-salt diet. Has not needed furosemide. Numbness in arms hands and feet is improving. She is taking carvedilol 12.61mg41mD, but is only on Entresto 49/51mg69m.  Current CHF meds: Entresto 49/51 mg twice a day, Jardiance 10mg 40my, spironolactone 12.61mg da21m, carvedilol 12.61mg twi19ma day Previously tried:  BP goal: <130/80  Family History:  Family History  Problem Relation Age of Onset  . Diabetes Father   .  Hypertension Father   . Cancer Mother        cervical      Social History:  Trying to quite smoking- cigarette here or there, glass of wine per week, no illicit drugs  Diet: breakfast: coffee with splenda and creamer Lunch: turkey sKuwaith, pesto pasta salad and water Dinner: meat in air fryer, salad (lettuce, cucumber, tomatoes, cheese w/ cauliflower chips and little bit of ranch dressing)  Exercise: can only walk for 5 min before SOB  Home BP readings: 120's  Wt Readings from Last 3 Encounters:  02/11/21 266 lb (120.7 kg)  01/28/21 266 lb 9.6 oz (120.9 kg)  01/06/21 265 lb (120.2 kg)   BP Readings from Last 3 Encounters:  01/28/21 124/76  01/23/21 108/70  01/06/21 120/72   Pulse Readings from Last 3 Encounters:  01/28/21 82  01/23/21 85  01/06/21 86    Renal function: Estimated Creatinine Clearance: 66.4 mL/min (A) (by C-G formula based on SCr of 1.33 mg/dL (H)).  Past Medical History:  Diagnosis Date  . Anemia    H/O  . Anxiety   . Depression   . Diabetes mellitus type 1 (HCC)   [place] (gastroes[place] disease[place]   . HIV (human immunodeficiency virus infection) (HCC) [place]. Hypertension   . Type 2 diabetes mellitus (HCC)[place]  Current Outpatient Medications on File Prior to Visit  Medication Sig Dispense Refill  . Accu-Chek FastClix Lancets MISC 1 Units by Does not apply route 4 (four) times daily as needed. 102 each 12  . albuterol (VENTOLIN HFA) 108 (90 Base) MCG/ACT inhaler Inhale 2 puffs into the lungs every 4 (four) hours as needed for wheezing or shortness of breath. 8 g 0  . ALPRAZolam (XANAX) 1 MG tablet Take 1 mg by mouth 2 (two) times daily as needed for anxiety (panic attack).    Marland Kitchen atorvastatin (LIPITOR) 10 MG tablet Take 1 tablet (10 mg total) by mouth daily. 90 tablet 1  . bictegravir-emtricitabine-tenofovir AF (BIKTARVY) 50-200-25 MG TABS tablet Take 1 tablet by mouth daily. 30 tablet 2  . blood glucose meter kit and supplies KIT Dispense  based on patient and insurance preference. Use up to four times daily as directed. (FOR ICD-9 250.00, 250.01). 1 each 0  . Blood Glucose Monitoring Suppl (ACCU-CHEK GUIDE ME) w/Device KIT 1 Units by Does not apply route 4 (four) times daily as needed. 1 kit 0  . buPROPion (WELLBUTRIN XL) 300 MG 24 hr tablet Take 300 mg by mouth daily.    . busPIRone (BUSPAR) 10 MG tablet Take 10 mg by mouth 3 (three) times daily.    . carvedilol (COREG) 6.25 MG tablet Take 2 tablets (12.5 mg total) by mouth 2 (two) times daily. 180 tablet 3  . furosemide (LASIX) 40 MG tablet Take 0.5 tablets (20 mg total) by mouth daily as needed. 30 tablet 2  . GLOBAL EASE INJECT PEN NEEDLES 32G X 4 MM MISC USE AS DIRECTED TO ADMINISTER INSULIN THREE TIMES DAILY BEFORE MEALS 100 each 9  . glucose blood (ACCU-CHEK GUIDE) test strip Use 3 times a day. Dx code: E11.9 100 each 12  . insulin aspart (NOVOLOG) 100 UNIT/ML FlexPen Per sliding scale. Max daily dose 40 units. (Patient taking differently: Inject 2-10 Units into the skin See admin instructions. Injects 2-10 units into the skin per sliding scale. Max daily dose 40 units. 150-199 mg/dl : 2 units; 200-249 mg/dL: 4 units; 250 - 299 mg/dL: 6 units; 300 - 349 mg/dL: 8 units; 350 -399 mg/ dL: 10 units) 15 mL 11  . Insulin Glargine (LANTUS SOLOSTAR) 100 UNIT/ML Solostar Pen INJECT 30 UNITS INTO THE SKIN DAILY AT 10 IN THE EVENING (Patient taking differently: Inject 30 Units into the skin daily.) 15 mL 11  . JARDIANCE 10 MG TABS tablet TAKE ONE TABLET BY MOUTH DAILY 30 tablet 0  . sacubitril-valsartan (ENTRESTO) 49-51 MG Take 1 tablet by mouth 2 (two) times daily. 180 tablet 3  . sertraline (ZOLOFT) 100 MG tablet Take by mouth daily.    Marland Kitchen spironolactone (ALDACTONE) 25 MG tablet TAKE HALF TABLET BY MOUTH AT BEDTIME 15 tablet 0  . traZODone (DESYREL) 50 MG tablet Take 50-100 mg by mouth at bedtime as needed for sleep.   1   No current facility-administered medications on file prior to  visit.    Allergies  Allergen Reactions  . Percocet [Oxycodone-Acetaminophen] Hives, Itching and Rash  . Latex Rash     Assessment/Plan:  1. CHF - Blood pressure in clinic 116/74. Will get a BMP today due to increase Entresto dose. If stable, would like to increase Entresto to target dose of 97/156m BID. Discussed target doses of each medication with patient and rational behind attempting to achieve those doses. I will call patient tomorrow with lab results and discuss medication changes. Follow up  in clinic in 3 weeks.  2. Tobacco cessation- Encouraged patient to quite completely. She is not sure if she can completely due to the stressors in her life. We did discuss other methods for stress release. Patient does not feel those will work for her due to her PTSD/depression. Congratulated her on decreasing the amount she smokes.  3. Depression/PTSD- Patient seeing therapist. Encouraged her to stick with it. Congratulated her on being brave enough to see help.  Thank you,  Ramond Dial, Pharm.D, BCPS, CPP [place]  [address] [place], [place] [address]  Phone: [phone]; Fax: [phone]

## 2021-02-13 NOTE — Patient Instructions (Signed)
It was nice to see you today!  I will call you tomorrow with your lab results and we can discuss which medication we will increase  Call me at [phone] with any questions

## 2021-02-14 ENCOUNTER — Telehealth: Payer: Self-pay | Admitting: Pharmacist

## 2021-02-14 NOTE — Telephone Encounter (Signed)
BMP stable, K 4.9. Would like to attempt to increase Entresto to 97/103 if patient willing. Finally got hold of patient after several attempts. Will increase Entresto as planned. Follow up in the office 5/19

## 2021-02-18 MED ORDER — SACUBITRIL-VALSARTAN 97-103 MG PO TABS
1.0000 | ORAL_TABLET | Freq: Two times a day (BID) | ORAL | 3 refills | Status: DC
Start: 2021-02-18 — End: 2022-04-13

## 2021-02-20 ENCOUNTER — Encounter (HOSPITAL_BASED_OUTPATIENT_CLINIC_OR_DEPARTMENT_OTHER): Payer: Self-pay | Admitting: Cardiovascular Disease

## 2021-02-20 NOTE — Procedures (Signed)
    Patient Name: Sherry Hall, Sherry Hall Study Date: 02/12/2021 Gender: Female D.O.B: 02-18-1981 Age (years): 39 Referring Provider: Ronnald Ramp ONeal Height (inches): 60 Interpreting Physician: Nicki Guadalajara MD, ABSM Weight (lbs): 266 RPSGT: Burien Sink BMI: 52 MRN: [id number] Neck Size: 14.00  CLINICAL INFORMATION Sleep Study Type: HST  Indication for sleep study: OSA  Epworth Sleepiness Score:  8  Most recent polysomnogram dated 03/13/2016 revealed an AHI of 1.4/h and RDI of 2.6/h.  SLEEP STUDY TECHNIQUE A multi-channel overnight portable sleep study was performed. The channels recorded were: nasal airflow, thoracic respiratory movement, and oxygen saturation with a pulse oximetry. Snoring was also monitored.  MEDICATIONS Patient self administered medications include: N/A.  SLEEP ARCHITECTURE Patient was studied for 372.3 minutes. The sleep efficiency was 100.0 % and the patient was supine for 16.5%. The arousal index was 0.0 per hour.  RESPIRATORY PARAMETERS The overall AHI was 9.8 per hour, with a central apnea index of 0 per hour.  The oxygen nadir was 88% during sleep.  CARDIAC DATA Mean heart rate during sleep was 79.8 bpm.  IMPRESSIONS - Mild obstructive sleep apnea occurred during this study (AHI 9.8/h). The severity during REM sleep cannot be assessed on this home study. - Mild oxygen desaturation to a nadir of 88%. - Patient snored 2.6% during the sleep.  DIAGNOSIS - Obstructive Sleep Apnea (G47.33)  RECOMMENDATIONS - Therapeutic CPAP titration to determine optimal pressure required to alleviate sleep disordered breathing. If unable to obtain an in-lab titration, initiate Auto-PAP with EPR of 3 at 6 - 16 cm of water. - Effort should be made to optinmize nasal and oropharyngeal patency - If patient is against CPAP can consider a customized oral appliance as an alternative. - Avoid alcohol, sedatives and other CNS depressants that may worsen  sleep apnea and disrupt normal sleep architecture. - Sleep hygiene should be reviewed to assess factors that may improve sleep quality. - Weight management (BMI 52) and regular exercise should be initiated or continued. - Recommend a download and sleep clinic evaluation after initiation of therapy.   [Electronically signed] 02/20/2021 08:53 AM  Nicki Guadalajara MD, [facility], ABSM Diplomate, American Board of Sleep Medicine   NPI: [id number] [facility] PH: [id number]   FX: [phone] ACCREDITED BY THE AMERICAN ACADEMY OF SLEEP MEDICINE

## 2021-02-21 LAB — SPECIMEN STATUS REPORT

## 2021-02-21 LAB — VITAMIN B12: Vitamin B-12: 501 pg/mL (ref 232–1245)

## 2021-02-27 ENCOUNTER — Other Ambulatory Visit: Payer: Self-pay | Admitting: Cardiovascular Disease

## 2021-02-27 ENCOUNTER — Telehealth: Payer: Self-pay | Admitting: *Deleted

## 2021-02-27 DIAGNOSIS — G4736 Sleep related hypoventilation in conditions classified elsewhere: Secondary | ICD-10-CM

## 2021-02-27 DIAGNOSIS — G4733 Obstructive sleep apnea (adult) (pediatric): Secondary | ICD-10-CM

## 2021-02-27 NOTE — Telephone Encounter (Signed)
-----   Message from Lennette Bihari, MD sent at 02/20/2021  8:57 AM EDT ----- Burna Mortimer, please notify pt of the results and attempt in lab titration; otherwise Auto-PAP per note

## 2021-02-27 NOTE — Telephone Encounter (Signed)
Patient notified of HST results and recommendations. CPAP titration scheduled for 04/25/21. Patient aware.

## 2021-02-27 NOTE — Telephone Encounter (Signed)
-----   Message from Thomas A Kelly, MD sent at 02/20/2021  8:57 AM EDT ----- Sherry Hall, please notify pt of the results and attempt in lab titration; otherwise Auto-PAP per note 

## 2021-03-06 ENCOUNTER — Ambulatory Visit (INDEPENDENT_AMBULATORY_CARE_PROVIDER_SITE_OTHER): Payer: Medicare Other | Admitting: Pharmacist

## 2021-03-06 ENCOUNTER — Other Ambulatory Visit: Payer: Self-pay

## 2021-03-06 VITALS — BP 138/78 | HR 74

## 2021-03-06 DIAGNOSIS — I5022 Chronic systolic (congestive) heart failure: Secondary | ICD-10-CM | POA: Diagnosis not present

## 2021-03-06 LAB — BASIC METABOLIC PANEL
BUN/Creatinine Ratio: 17 (ref 9–23)
BUN: 19 mg/dL (ref 6–24)
CO2: 26 mmol/L (ref 20–29)
Calcium: 8.5 mg/dL — ABNORMAL LOW (ref 8.7–10.2)
Chloride: 97 mmol/L (ref 96–106)
Creatinine, Ser: 1.14 mg/dL — ABNORMAL HIGH (ref 0.57–1.00)
Glucose: 321 mg/dL — ABNORMAL HIGH (ref 65–99)
Potassium: 4.2 mmol/L (ref 3.5–5.2)
Sodium: 133 mmol/L — ABNORMAL LOW (ref 134–144)
eGFR: 62 mL/min/{1.73_m2} (ref >59)

## 2021-03-06 MED ORDER — BLOOD PRESSURE CUFF MISC: 1.0000 [IU] | Freq: Every day | 0 refills | Status: DC

## 2021-03-06 NOTE — Progress Notes (Signed)
Patient ID: Sherry Hall                 DOB: 1981/04/24                      MRN: [id number]     HPI: Sherry Hall is a 40 y.o. female referred by Dr. Audie Box to pharmacy clinic for HF medication management. PMH is significant for CHF, DM, obesity (BMI 52) and HLD. Most recent LVEF 35-40% on 12/15/20.  Today she returns to pharmacy clinic for further medication titration. At last visit her blood pressure was well controlled.  Entresto was increased to 97/130m BID. Home blood pressure cuff was found accurate.   Patient presents today to clinic for follow up. She states she does have dizziness when bending over than last a few min. No SOB. She had her sleep study done which was positive for sleep apnea. She does not have a CPAP titration study until July. Complains of worsening acid reflux symptoms. Has been using tums and famotidine. She lost her wrist cuff. Now using an OMRON upper arm cuff. Cuff is too small for her however. The cuff also errors out several times. She bought it at a warehouse. Did not get an accurate reading with home cuff compared to clinic reading. Symptomatically, she is feeling way better. Able to complete almost all ADLs. Energy level is still low. Only sleeps 4-5 hours per night. Has trouble falling asleep and then only stays a sleep 4-5 hours. No LEE, PND, or orthopnea. Appetite has been pretty good. She tires to adheres to a low-salt diet. Has not needed furosemide. Numbness in arms hands and feet is improving.  She is still going to counseling, sons have not gotten in yet.  Current CHF meds: Entresto 97/1013mtwice a day, Jardiance 1036maily, spironolactone 12.5mg36mily, carvedilol 12.5mg 93mce a day Previously tried:  BP goal: <130/80  Family History:  Family History  Problem Relation Age of Onset  . Diabetes Father   . Hypertension Father   . Cancer Mother        cervical      Social History:  Trying to quite smoking- cigarette here or there,  glass of wine per week, no illicit drugs  Diet: breakfast: coffee with splenda and creamer Lunch: turkeKuwaitwich, pesto pasta salad and water Dinner: meat in air fryer, salad (lettuce, cucumber, tomatoes, cheese w/ cauliflower chips and little bit of ranch dressing)  Exercise: can only walk for 5 min before SOB  Home BP readings: 128/79, 134/83, 111/67, 138/85, 132/79, 100/74, 123/85, 123/88, 131/89, 108/68, 132/89, 129/83, 110/68, 108/72, 138/85 HR 62-102  Wt Readings from Last 3 Encounters:  02/11/21 266 lb (120.7 kg)  01/28/21 266 lb 9.6 oz (120.9 kg)  01/06/21 265 lb (120.2 kg)   BP Readings from Last 3 Encounters:  02/13/21 116/74  01/28/21 124/76  01/23/21 108/70   Pulse Readings from Last 3 Encounters:  02/13/21 85  01/28/21 82  01/23/21 85    Renal function: CrCl cannot be calculated (Unknown ideal weight.).  Past Medical History:  Diagnosis Date  . Anemia    H/O  . Anxiety   . Depression   . Diabetes mellitus type 1 (HCC) [place] GERD (gastroesophageal reflux disease)   . HIV (human immunodeficiency virus infection) (HCC) [place]  . Hypertension   . Type 2 diabetes mellitus (HCC) [place] Current Outpatient Medications on File Prior to Visit  Medication Sig Dispense Refill  .  Accu-Chek FastClix Lancets MISC 1 Units by Does not apply route 4 (four) times daily as needed. 102 each 12  . albuterol (VENTOLIN HFA) 108 (90 Base) MCG/ACT inhaler Inhale 2 puffs into the lungs every 4 (four) hours as needed for wheezing or shortness of breath. 8 g 0  . ALPRAZolam (XANAX) 1 MG tablet Take 1 mg by mouth 2 (two) times daily as needed for anxiety (panic attack).    Marland Kitchen atorvastatin (LIPITOR) 10 MG tablet Take 1 tablet (10 mg total) by mouth daily. 90 tablet 1  . bictegravir-emtricitabine-tenofovir AF (BIKTARVY) 50-200-25 MG TABS tablet Take 1 tablet by mouth daily. 30 tablet 2  . blood glucose meter kit and supplies KIT Dispense based on patient and insurance preference. Use up to  four times daily as directed. (FOR ICD-9 250.00, 250.01). 1 each 0  . Blood Glucose Monitoring Suppl (ACCU-CHEK GUIDE ME) w/Device KIT 1 Units by Does not apply route 4 (four) times daily as needed. 1 kit 0  . buPROPion (WELLBUTRIN XL) 300 MG 24 hr tablet Take 300 mg by mouth daily.    . busPIRone (BUSPAR) 10 MG tablet Take 10 mg by mouth 3 (three) times daily.    . carvedilol (COREG) 6.25 MG tablet Take 2 tablets (12.5 mg total) by mouth 2 (two) times daily. 180 tablet 3  . furosemide (LASIX) 40 MG tablet Take 0.5 tablets (20 mg total) by mouth daily as needed. 30 tablet 2  . GLOBAL EASE INJECT PEN NEEDLES 32G X 4 MM MISC USE AS DIRECTED TO ADMINISTER INSULIN THREE TIMES DAILY BEFORE MEALS 100 each 9  . glucose blood (ACCU-CHEK GUIDE) test strip Use 3 times a day. Dx code: E11.9 100 each 12  . insulin aspart (NOVOLOG) 100 UNIT/ML FlexPen Per sliding scale. Max daily dose 40 units. (Patient taking differently: Inject 2-10 Units into the skin See admin instructions. Injects 2-10 units into the skin per sliding scale. Max daily dose 40 units. 150-199 mg/dl : 2 units; 200-249 mg/dL: 4 units; 250 - 299 mg/dL: 6 units; 300 - 349 mg/dL: 8 units; 350 -399 mg/ dL: 10 units) 15 mL 11  . Insulin Glargine (LANTUS SOLOSTAR) 100 UNIT/ML Solostar Pen INJECT 30 UNITS INTO THE SKIN DAILY AT 10 IN THE EVENING (Patient taking differently: Inject 30 Units into the skin daily.) 15 mL 11  . JARDIANCE 10 MG TABS tablet TAKE ONE TABLET BY MOUTH DAILY 30 tablet 0  . sacubitril-valsartan (ENTRESTO) 97-103 MG Take 1 tablet by mouth 2 (two) times daily. 180 tablet 3  . sertraline (ZOLOFT) 100 MG tablet Take by mouth daily.    Marland Kitchen spironolactone (ALDACTONE) 25 MG tablet TAKE HALF TABLET BY MOUTH AT BEDTIME 15 tablet 0  . traZODone (DESYREL) 50 MG tablet Take 50-100 mg by mouth at bedtime as needed for sleep.   1   No current facility-administered medications on file prior to visit.    Allergies  Allergen Reactions  .  Percocet [Oxycodone-Acetaminophen] Hives, Itching and Rash  . Latex Rash     Assessment/Plan:  1. CHF - Blood pressure in clinic 138/78. This is above goal of <130/80. Patient states she has been a little worked up today. States she took her meds this AM. Newer home BP cuff if not accurate. Will get a BMP today due to increase Entresto dose. If stable, would like to increase spironolactone to 77m daily. If unable to increase spironolactone, will increase carvedilol to 286mBID. I will call patient tomorrow with results  and discuss. I have sent her in an Rx for XL BP cuff to see if insurance will cover. I have asked her to let me know if they cover. If they do not, I will reach out to social work to see if we can order an XL cuff.  2. Depression/PTSD- Patient seeing therapist. Encouraged her to stick with it. Congratulated her on being brave enough to see help. I have provided her two counseling groups she can reach out to if her sons are unable to get in with the same practice she is in.  3. Weight loss- Patient asked about bariatric surgery. States that Dr. Audie Box said he would refer her for surgery, but [facility] which [facility] not do [facility]ric surgery. She does not want to do the [facility] as it would cost her too much and she states she has tried every diet and always plateau after 40lb. I encouraged her to reach out to Dr. Audie Box. I will also send him message.    Thank you,  Ramond Dial, Pharm.D, BCPS, CPP [place]  [address] [place], [place] [address]  Phone: [phone]; Fax: [phone]

## 2021-03-06 NOTE — Patient Instructions (Addendum)
[facility] ([email or web address]) [facility] ([email or web address])  I will call you tomorrow with your lab results  The phone number that will appear on caller ID should be [phone]  You can call me at [phone]  Please let me know if you were able to get the blood pressure cuff filled at pharmacy

## 2021-03-07 ENCOUNTER — Telehealth: Payer: Self-pay | Admitting: Pharmacist

## 2021-03-07 ENCOUNTER — Telehealth: Payer: Self-pay

## 2021-03-07 ENCOUNTER — Other Ambulatory Visit: Payer: Self-pay

## 2021-03-07 MED ORDER — CARVEDILOL 25 MG PO TABS: 25.0000 mg | ORAL_TABLET | Freq: Two times a day (BID) | ORAL | 3 refills | Status: DC

## 2021-03-07 NOTE — Telephone Encounter (Signed)
I attempted to contact patient that I had sent over a referral to [facility] for surgery consult.   Patient did not answer, not able to leave a message.

## 2021-03-07 NOTE — Telephone Encounter (Signed)
-----   Message from Sande Rives, MD sent at 03/07/2021 10:19 AM EDT -----   ----- Message ----- From: Olene Floss, RPH-CPP Sent: 03/07/2021   9:59 AM EDT To: Sande Rives, MD  I spoke with one of NP [facility]. She said that you can place a direct referral to [facility] for bariatric surgery.  ----- Message ----- From: Sande Rives, MD Sent: 03/06/2021   1:19 PM EDT To: Olene Floss, RPH-CPP  That is the only way to get bariatric surgery. They have to go through that clinic I thought.   [name] T. Flora Lipps, MD, [facility]  [facility]  [address] [place], [place] [address] [phone]  1:19 PM  ----- Message ----- From: Olene Floss, RPH-CPP Sent: 03/06/2021  11:26 AM EDT To: Sande Rives, MD  Patient under the impression you would refer her for Bariatric surgery however [facility]. I explained that sometimes in order to qualify for bariatric surgery you need to prove that you can lose some weight on your own. She states does not want to do the [facility] as it would cost her too much and she states she has tried every diet and always plateau after 40lb.

## 2021-03-07 NOTE — Progress Notes (Signed)
Sherry Hall:  Can you refer her to bariatric surgery?  [name] T. Flora Lipps, MD, [facility]  [facility]  [address], [address] [place], [place] [address] [phone]  10:19 AM

## 2021-03-07 NOTE — Telephone Encounter (Signed)
Scr stable. Sodium and Ca marginally low. Blood sugar high. K 4.2. Will hold off on increasing spironolactone for now due to Na and Ca. Instead will increase carvedilol to 25mg  twice a day. Spoke to patient. Advised her of the above. Follow up apt scheduled for 6/9

## 2021-03-12 ENCOUNTER — Other Ambulatory Visit (HOSPITAL_COMMUNITY): Payer: Medicare Other

## 2021-03-27 ENCOUNTER — Other Ambulatory Visit: Payer: Self-pay | Admitting: Infectious Diseases

## 2021-03-27 ENCOUNTER — Ambulatory Visit: Payer: Medicare Other

## 2021-03-27 ENCOUNTER — Other Ambulatory Visit: Payer: Self-pay | Admitting: Cardiovascular Disease

## 2021-03-27 ENCOUNTER — Other Ambulatory Visit: Payer: Self-pay

## 2021-03-27 DIAGNOSIS — B20 Human immunodeficiency virus [HIV] disease: Secondary | ICD-10-CM

## 2021-03-27 MED ORDER — BIKTARVY 50-200-25 MG PO TABS: 1.0000 | ORAL_TABLET | Freq: Every day | ORAL | 2 refills | Status: DC

## 2021-03-27 NOTE — Progress Notes (Deleted)
Patient ID: Sherry Hall                 DOB: Nov 30, 1980                      MRN: [id number]     HPI: Sherry Hall is a 40 y.o. female referred by Dr. Audie Box to pharmacy clinic for HF medication management. PMH is significant for CHF, DM, obesity (BMI 52) and HLD. Most recent LVEF 35-40% on 12/15/20.  Today she returns to pharmacy clinic for further medication titration. At last visit her blood pressure was elevated. Carvedilol was increased to 62m twice a day. She misplaced her original blood pressure cuff. Unfortunately, her new home blood pressure cuff was found to be inaccurate. Na and Ca were minimal low in BMP.  Recheck BMP BP? Cuff? Referral to bariatric surgery placed Dizziness, lightheadedness, headache, blurred vision, SOB, swelling   Patient presents today to clinic for follow up. She states she does have dizziness when bending over than last a few min. No SOB. She had her sleep study done which was positive for sleep apnea. She does not have a CPAP titration study until July. Complains of worsening acid reflux symptoms. Has been using tums and famotidine. She lost her wrist cuff. Now using an OMRON upper arm cuff. Cuff is too small for her however. The cuff also errors out several times. She bought it at a warehouse. Did not get an accurate reading with home cuff compared to clinic reading. Symptomatically, she is feeling way better. Able to complete almost all ADLs. Energy level is still low. Only sleeps 4-5 hours per night. Has trouble falling asleep and then only stays a sleep 4-5 hours. No LEE, PND, or orthopnea. Appetite has been pretty good. She tires to adheres to a low-salt diet. Has not needed furosemide. Numbness in arms hands and feet is improving.  She is still going to counseling, sons have not gotten in yet.  Current CHF meds: Entresto 97/1029mtwice a day, Jardiance 1065maily, spironolactone 12.5mg39mily, carvedilol 25mg5mce a day Previously tried:   BP goal: <130/80  Family History:  Family History  Problem Relation Age of Onset   Diabetes Father    Hypertension Father    Cancer Mother        cervical      Social History:  Trying to quite smoking- cigarette here or there, glass of wine per week, no illicit drugs  Diet: breakfast: coffee with splenda and creamer Lunch: turkeKuwaitwich, pesto pasta salad and water Dinner: meat in air fryer, salad (lettuce, cucumber, tomatoes, cheese w/ cauliflower chips and little bit of ranch dressing)  Exercise: can only walk for 5 min before SOB  Home BP readings: 128/79, 134/83, 111/67, 138/85, 132/79, 100/74, 123/85, 123/88, 131/89, 108/68, 132/89, 129/83, 110/68, 108/72, 138/85 HR 62-102  Wt Readings from Last 3 Encounters:  02/11/21 266 lb (120.7 kg)  01/28/21 266 lb 9.6 oz (120.9 kg)  01/06/21 265 lb (120.2 kg)   BP Readings from Last 3 Encounters:  03/06/21 138/78  02/13/21 116/74  01/28/21 124/76   Pulse Readings from Last 3 Encounters:  03/06/21 74  02/13/21 85  01/28/21 82    Renal function: CrCl cannot be calculated (Unknown ideal weight.).  Past Medical History:  Diagnosis Date   Anemia    H/O   Anxiety    Depression    Diabetes mellitus type 1 (HCC)    GERD (gastroesophageal reflux disease)  HIV (human immunodeficiency virus infection) ([place]) 2011   Hypertension    Type 2 diabetes mellitus ([place])     Current Outpatient Medications on File Prior to Visit  Medication Sig Dispense Refill   Accu-Chek FastClix Lancets MISC 1 Units by Does not apply route 4 (four) times daily as needed. 102 each 12   albuterol (VENTOLIN HFA) 108 (90 Base) MCG/ACT inhaler Inhale 2 puffs into the lungs every 4 (four) hours as needed for wheezing or shortness of breath. 8 g 0   ALPRAZolam (XANAX) 1 MG tablet Take 1 mg by mouth 2 (two) times daily as needed for anxiety (panic attack).     atorvastatin (LIPITOR) 10 MG tablet Take 1 tablet (10 mg total) by mouth daily. 90 tablet 1    bictegravir-emtricitabine-tenofovir AF (BIKTARVY) 50-200-25 MG TABS tablet Take 1 tablet by mouth daily. 30 tablet 2   blood glucose meter kit and supplies KIT Dispense based on patient and insurance preference. Use up to four times daily as directed. (FOR ICD-9 250.00, 250.01). 1 each 0   Blood Glucose Monitoring Suppl (ACCU-CHEK GUIDE ME) w/Device KIT 1 Units by Does not apply route 4 (four) times daily as needed. 1 kit 0   Blood Pressure Monitoring (BLOOD PRESSURE CUFF) MISC 1 Units by Does not apply route daily. Please dispense on EXTRA LARGE (up to 52cm) blood pressure cuff kit 1 each 0   buPROPion (WELLBUTRIN XL) 300 MG 24 hr tablet Take 300 mg by mouth daily.     busPIRone (BUSPAR) 10 MG tablet Take 10 mg by mouth 3 (three) times daily.     carvedilol (COREG) 25 MG tablet Take 1 tablet (25 mg total) by mouth 2 (two) times daily. 180 tablet 3   furosemide (LASIX) 40 MG tablet Take 0.5 tablets (20 mg total) by mouth daily as needed. 30 tablet 2   GLOBAL EASE INJECT PEN NEEDLES 32G X 4 MM MISC USE AS DIRECTED TO ADMINISTER INSULIN THREE TIMES DAILY BEFORE MEALS 100 each 9   glucose blood (ACCU-CHEK GUIDE) test strip Use 3 times a day. Dx code: E11.9 100 each 12   insulin aspart (NOVOLOG) 100 UNIT/ML FlexPen Per sliding scale. Max daily dose 40 units. (Patient taking differently: Inject 2-10 Units into the skin See admin instructions. Injects 2-10 units into the skin per sliding scale. Max daily dose 40 units. 150-199 mg/dl : 2 units; 200-249 mg/dL: 4 units; 250 - 299 mg/dL: 6 units; 300 - 349 mg/dL: 8 units; 350 -399 mg/ dL: 10 units) 15 mL 11   Insulin Glargine (LANTUS SOLOSTAR) 100 UNIT/ML Solostar Pen INJECT 30 UNITS INTO THE SKIN DAILY AT 10 IN THE EVENING (Patient taking differently: Inject 30 Units into the skin daily.) 15 mL 11   JARDIANCE 10 MG TABS tablet TAKE ONE TABLET BY MOUTH DAILY 30 tablet 0   sacubitril-valsartan (ENTRESTO) 97-103 MG Take 1 tablet by mouth 2 (two) times daily. 180  tablet 3   sertraline (ZOLOFT) 100 MG tablet Take by mouth daily.     spironolactone (ALDACTONE) 25 MG tablet TAKE HALF TABLET BY MOUTH AT BEDTIME 15 tablet 0   traZODone (DESYREL) 50 MG tablet Take 50-100 mg by mouth at bedtime as needed for sleep.   1   No current facility-administered medications on file prior to visit.    Allergies  Allergen Reactions   Percocet [Oxycodone-Acetaminophen] Hives, Itching and Rash   Latex Rash     Assessment/Plan:  1. CHF - Blood pressure in clinic  138/78. This is above goal of <130/80. Patient states she has been a little worked up today. States she took her meds this AM. Newer home BP cuff if not accurate. Will get a BMP today due to increase Entresto dose. If stable, would like to increase spironolactone to 48m daily. If unable to increase spironolactone, will increase carvedilol to 248mBID. I will call patient tomorrow with results and discuss. I have sent her in an Rx for XL BP cuff to see if insurance will cover. I have asked her to let me know if they cover. If they do not, I will reach out to social work to see if we can order an XL cuff.  2. Depression/PTSD- Patient seeing therapist. Encouraged her to stick with it. Congratulated her on being brave enough to see help. I have provided her two counseling groups she can reach out to if her sons are unable to get in with the same practice she is in.  3. Weight loss- Patient asked about bariatric surgery. States that Dr. O'Audie Boxaid he would refer her for surgery, but [facility] which does not do b[facility]ric surgery. She does not want to do the [facility] as it would cost her too much and she states she has tried every diet and always plateau after 40lb. I encouraged her to reach out to Dr. O'Audie BoxI will also send him message.    Thank you,  MeRamond DialPharm.D, BCPS, CPP Co[id number].  Ch[address] StreetGrMorgantown[place] [address]Phone: [phone]: [phone]

## 2021-04-01 ENCOUNTER — Ambulatory Visit: Payer: Medicare Other | Admitting: Family Medicine

## 2021-04-01 ENCOUNTER — Other Ambulatory Visit: Payer: Self-pay | Admitting: Infectious Diseases

## 2021-04-01 DIAGNOSIS — B20 Human immunodeficiency virus [HIV] disease: Secondary | ICD-10-CM

## 2021-04-07 ENCOUNTER — Telehealth: Payer: Self-pay

## 2021-04-07 NOTE — Telephone Encounter (Signed)
CALLED TO R/S APPT BUT NO VM SETUP

## 2021-04-15 ENCOUNTER — Telehealth: Payer: Self-pay

## 2021-04-15 NOTE — Telephone Encounter (Signed)
2nd attempt to r/s pharmd visit no vm set up

## 2021-04-15 NOTE — Telephone Encounter (Signed)
-----   Message from Olene Floss, RPH-CPP sent at 04/15/2021  3:50 PM EDT -----  ----- Message ----- From: Eather Colas, CMA Sent: 04/07/2021  10:32 AM EDT To: Olene Floss, RPH-CPP  Tried to call but no vm setup  ----- Message ----- From: Olene Floss, RPH-CPP Sent: 04/07/2021  10:16 AM EDT To: Eather Colas, CMA   ----- Message ----- From: Olene Floss, RPH-CPP Sent: 04/07/2021  10:01 AM EDT To: Olene Floss, RPH-CPP  Please call to reschedule pt- she doesn't have VM so if she doesn't answer you wont be able to leave message

## 2021-04-18 ENCOUNTER — Ambulatory Visit (INDEPENDENT_AMBULATORY_CARE_PROVIDER_SITE_OTHER): Payer: Medicare Other | Admitting: Infectious Diseases

## 2021-04-18 ENCOUNTER — Encounter: Payer: Self-pay | Admitting: Infectious Diseases

## 2021-04-18 ENCOUNTER — Other Ambulatory Visit: Payer: Self-pay

## 2021-04-18 DIAGNOSIS — Z8616 Personal history of COVID-19: Secondary | ICD-10-CM | POA: Diagnosis not present

## 2021-04-18 DIAGNOSIS — B20 Human immunodeficiency virus [HIV] disease: Secondary | ICD-10-CM | POA: Diagnosis not present

## 2021-04-18 DIAGNOSIS — K529 Noninfective gastroenteritis and colitis, unspecified: Secondary | ICD-10-CM | POA: Insufficient documentation

## 2021-04-18 MED ORDER — ONDANSETRON 4 MG PO TBDP: 4.0000 mg | ORAL_TABLET | Freq: Three times a day (TID) | ORAL | 0 refills | Status: DC | PRN

## 2021-04-18 NOTE — Assessment & Plan Note (Signed)
Sudden onset abdominal pain/bloating, nausea, vomiting, watery diarrhea.  No fevers or chills.  She wonders if it something she ate.  No blood in her stool.  Could be viral versus foodborne.  Nobody else in the family or house has any symptoms like hers.  I encouraged her to keep simple bland diet, prescription for ODT Zofran given.  Recommended good handwashing.  Advised to call back should her symptoms last longer than 5 days, become more severe or she develops fevers.

## 2021-04-18 NOTE — Assessment & Plan Note (Signed)
Counseling regarding COVID booster provided today.  I offered that she can schedule her booster vaccine when she comes for labs next week if she agrees.

## 2021-04-18 NOTE — Progress Notes (Signed)
Name: Sherry Hall  OMV:[id number]   DOB: [birth year]   PCP: Dorena Dew, FNP    VIRTUAL CARE ENCOUNTER  I connected with Sherry Hall on 04/18/21 at 10:15 AM EDT by TELEPHONE and verified that I am speaking with the correct person using two identifiers.   I discussed the limitations, risks, security and privacy concerns of performing an evaluation and management service by telephone and the availability of in person appointments. I also discussed with the patient that there may be a patient responsible charge related to this service. The patient expressed understanding and agreed to proceed.  Patient Location: [place] residence   Other Participants: none  Provider Location: [facility]   Brief History ID: Sherry Hall is a 40 y.o. female with HIV disease. Dx 2011. Intermittently on medications. Co morbidities: T1DM, HTN, Hyperlipidemia, obesity. HIV Risk: heterosexual Hx: OIs: none known  Hep B sAg (not on file); Hep BsAb (neg); Hep B cAb (not on file); Hep C (not on file); Hep A (not on file); HLA B*5701 (-); Quantiferon (-)  Previous Regimens: Complera Odefsey Biktarvy    Genotypes: 2017 - no significant mutations PR/RT   Chief Complaint  Patient presents with   Follow-up  GI symptoms / nausea and vomiting with watery diarrhea    HPI:  Sherry Hall called today to report she was too ill to come in for visit today with GI symptoms that started yesterday and have persisted all night.   She has been up all night with nausea, vomiting and diarrhea with abdominal cramping. Diarrhea is watery and green/brown.  Having a hard time keeping food and fluids down.  She is not try to eat anything yet today but plans to try some crackers after a phone visit.   She has been doing well taking all her medications as prescribed.  Has been following up with all specialists and states that she has been feeling much better than she has in the past.   "50/50 about  the COVID vaccine booster." Has some anxiety about it and side effects.  She did have COVID infection in February of this year where she was in the hospital with acute CHF symptoms.     Review of Systems  Constitutional:  Negative for appetite change, chills, fatigue, fever and unexpected weight change.  HENT:  Negative for mouth sores, sore throat and trouble swallowing.   Eyes:  Negative for pain and visual disturbance.  Respiratory:  Negative for cough and shortness of breath.   Cardiovascular:  Negative for chest pain.  Gastrointestinal:  Positive for abdominal distention, abdominal pain, diarrhea, nausea and vomiting.  Genitourinary:  Negative for dysuria, menstrual problem and pelvic pain.  Musculoskeletal:  Negative for back pain and neck pain.  Skin:  Negative for color change and rash.  Neurological:  Negative for weakness, numbness and headaches.  Hematological:  Negative for adenopathy.  Psychiatric/Behavioral:  Negative for dysphoric mood. The patient is not nervous/anxious.        Observations/Objective: There were no vitals filed for this visit.  There is no height or weight on file to calculate BMI.   Physical Exam Pulmonary:     Effort: Pulmonary effort is normal.     Comments: No shortness of breath detected in conversation.  Neurological:     Mental Status: She is oriented to person, place, and time.  Psychiatric:        Mood and Affect: Mood normal.        Behavior: Behavior normal.  Thought Content: Thought content normal.        Judgment: Judgment normal.     HIV 1 RNA Quant  Date Value  01/03/2021 <20 Copies/mL (H)  12/13/2020 3,510 copies/mL  08/10/2019 836 copies/mL (H)   CD4 T Cell Abs (/uL)  Date Value  12/12/2020 570  08/10/2019 535  12/26/2018 390 (L)    Lab Results  Component Value Date   CREATININE 1.14 (H) 03/06/2021   CREATININE 1.26 (H) 02/13/2021   CREATININE 1.33 (H) 01/27/2021    Lab Results  Component Value Date    WBC 6.7 01/27/2021   HGB 13.1 01/27/2021   HCT 39.5 01/27/2021   MCV 95 01/27/2021   PLT 231 01/27/2021    Lab Results  Component Value Date   ALT 10 01/27/2021   AST 7 01/27/2021   ALKPHOS 67 01/27/2021   BILITOT 0.2 01/27/2021     Assessment and Plan:  Problem List Items Addressed This Visit       High   HIV disease ([place]) - Primary (Chronic)    She is in a great job getting back on her medications not only for her HIV but for other comorbidities.  Her viral load in March was undetectable.  We will continue her Biktarvy once daily and have her update blood work again for adherence monitoring at her ability once she recovers from acute GI illness. Will need to get her her meningitis vaccine at upcoming office visit.  Also recommended the COVID booster shot but she may consider. We will have her follow-up in the office in 4 months. She is due for a Pap smear which we will discuss at her upcoming office visit in person.       Relevant Orders   HIV-1 RNA quant-no reflex-bld   T-helper cell (CD4)- ([facility] clinic only)     Unprioritized   History of COVID-19    Counseling regarding COVID booster provided today.  I offered that she can schedule her booster vaccine when she comes for labs next week if she agrees.       Acute gastroenteritis    Sudden onset abdominal pain/bloating, nausea, vomiting, watery diarrhea.  No fevers or chills.  She wonders if it something she ate.  No blood in her stool.  Could be viral versus foodborne.  Nobody else in the family or house has any symptoms like hers.  I encouraged her to keep simple bland diet, prescription for ODT Zofran given.  Recommended good handwashing.  Advised to call back should her symptoms last longer than 5 days, become more severe or she develops fevers.        Follow Up Instructions:    I discussed the assessment and treatment plan with the patient. The patient was provided an opportunity to ask questions and all  were answered. The patient agreed with the plan and demonstrated an understanding of the instructions.   The patient was advised to call back or seek an in-person evaluation if the symptoms worsen or if the condition fails to improve as anticipated.  I provided 12 minutes of non-face-to-face time during this encounter.   Janene Madeira, MSN, NP-C [facility] [email or web address] Pager: [phone] Office: [phone] [place]: [phone]

## 2021-04-18 NOTE — Patient Instructions (Signed)
It was very nice to talk to you on the phone today.    I sent in medication for nausea for you to have to help you through your illness. Please wash your hands with soap and water and have family members do the same.  Try and keep one toilet to yourself during your illness. Clean with beach solution to kill off any stomach viruses.   Please call if you cannot hold down fluids or have prolonged symptoms beyond 5 days or develop fevers.   Stay away from dairy products for now and heavily spiced / seasoned foods. No raw vegetables as they can be harder to digest.   When you feel better please call for a lab appointment at your ability to update blood work for you.   Vaccines Recommended:   COVID booster - we can help you with this if you decide to get it. Just schedule with your lab draw if you would like to get it   Recommended Next Office Visit:   4 months   Please continue to be well, stay away from folks that are sick, wash her hands frequently, do not touch her face or mouth and continue to take your medications.

## 2021-04-18 NOTE — Assessment & Plan Note (Signed)
She is in a great job getting back on her medications not only for her HIV but for other comorbidities.  Her viral load in March was undetectable.  We will continue her Biktarvy once daily and have her update blood work again for adherence monitoring at her ability once she recovers from acute GI illness. Will need to get her her meningitis vaccine at upcoming office visit.  Also recommended the COVID booster shot but she may consider. We will have her follow-up in the office in 4 months. She is due for a Pap smear which we will discuss at her upcoming office visit in person.

## 2021-04-25 ENCOUNTER — Other Ambulatory Visit: Payer: Self-pay

## 2021-04-25 ENCOUNTER — Ambulatory Visit (HOSPITAL_BASED_OUTPATIENT_CLINIC_OR_DEPARTMENT_OTHER): Payer: Medicare Other | Attending: Cardiovascular Disease | Admitting: Cardiovascular Disease

## 2021-04-25 DIAGNOSIS — G4736 Sleep related hypoventilation in conditions classified elsewhere: Secondary | ICD-10-CM | POA: Diagnosis not present

## 2021-04-25 DIAGNOSIS — G4733 Obstructive sleep apnea (adult) (pediatric): Secondary | ICD-10-CM | POA: Insufficient documentation

## 2021-04-28 ENCOUNTER — Other Ambulatory Visit: Payer: Self-pay

## 2021-05-03 NOTE — Progress Notes (Deleted)
Cardiology Office Note:    Date:  05/06/2021   ID:  Sherry Hall, DOB [birth year], MRN [id number]  PCP:  Massie Maroon, FNP  Cardiologist:  None  Electrophysiologist:  None   Referring MD: Massie Maroon, FNP   Chief Complaint: follow-up of CHF  History of Present Illness:    Sherry Hall is a 40 y.o. 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 Flora Lipps and presents today for routine follow-up.   Patient was first seen by Cardiology for new CHF during an admission in 11/2020. Echo showed LVEF of 35-40% with global hypokinesis and grade 2 diastolic dysfunction. RV normal. No significant valvular disease. Chest CTA during admission to rule out PE was negative for coronary calcium so this was felt to be a nonischemic cardiomyopathy due to hypertension, diabetes, and HIV. She was started on GDMT with Entresto, Coreg, Spironolactone, and Jardiance. At outpatient follow-up, sleep study was ordered which showed mild obstructive sleep apnea and she was started on CPAP ???. Patient was last seen by Dr. Flora Lipps in 01/2021 at which time she was overall doing really well. Coreg was increased. Since then she has also been seen by PharmD for further medication titration. Current medication regimen is Lasix 20mg  daily PRN for weight gain, Entresto 97-103mg  twice daily, Coreg 25mg  twice daily, Spironolactone 25mg  daily, and Jardiance 10mg  daily.   Repeat Echo on 05/06/2021 showed ***.  Nonischemic Cardiomyopathy  Chronic Combined CHF  - Initial diagnosed in 11/2020. LVEF of 35-40% at that time. Chest CTA for PE at that time showed no coronary calcium so felt to be nonischemic cardiomyopathy from HTN, DM, and HIV.  - Recent Echo on 05/06/2021 showed ???  - Euvolemic on exam.  -  Continue PRN Lasix 20mg  daily for weight gain.  - Continue Entresto 97-103mg  twice daily.  - Continue Coreg 25mg  twice daily.  - Continue Spironolactone 25mg  daily.  - Continue Jardiance 10mg  daily.  - Discussed importance of daily weights and sodium/fluid restrictions.   Hypertension  - Well controlled.  - Continue medications for CHF as above.   Hyperlipidemia  - *** - LDL goal <100.  - Continue Lipitor 10mg  daily.   Obstructive Sleep Apnea  - CPAP ***  Morbid Obesity  - BMI *** - Dr. has already referred patient to [facility] for surgery consult.  Past Medical History:  Diagnosis Date   Anemia    H/O   Anxiety    Depression    Diabetes mellitus type 1 (HCC)    GERD (gastroesophageal reflux disease)    HIV (human immunodeficiency virus infection) (HCC) 2011   Hypertension    Type 2 diabetes mellitus [facility])     Past Surgical History:  Procedure Laterality Date   CESAREAN SECTION  2001, 2008, 2009, 2011   CHOLECYSTECTOMY     EXCISION OF BREAST LESION N/A 01/04/2018   Procedure: EXCISION OF CHEST WALL ABSCESS. MIDDLE CHEST;  Surgeon: , MD;  Location: [facility] ORS;  Service: General;  Laterality: N/A;  resection of chest wall cyst / abscess   FRACTURE SURGERY Right    HAND   TONSILLECTOMY     TUBAL LIGATION  2011    Current Medications: No outpatient medications have been marked as taking for the 05/08/21 encounter (Appointment) with 2012  E, PA-C.     Allergies:   Percocet [oxycodone-acetaminophen] and Latex   Social History   Socioeconomic History   Marital status: Single    Spouse name: Not on file   Number of children: Not on file   Years of education: Not on file   Highest education level: Not on file  Occupational History   Not on file  Tobacco Use   Smoking status: Every Day    Packs/day: 0.50    Years: 15.00    Pack years: 7.50    Types: Cigarettes   Smokeless tobacco: Never   Tobacco  comments:    trying to slow  Vaping Use   Vaping Use: Never used  Substance and Sexual Activity   Alcohol use: Yes    Alcohol/week: 1.0 standard drink    Types: 1 Standard drinks or equivalent per week    Comment: socially   Drug use: No   Sexual activity: Yes    Partners: Male    Birth control/protection: Surgical    Comment: declined condoms 12/2020  Other Topics Concern   Not on file  Social History Narrative   Not on file   Social Determinants of Health   Financial Resource Strain: Not on file  Food Insecurity: Not on file  Transportation Needs: Not on file  Physical Activity: Not on file  Stress: Not on file  Social Connections: Not on file     Family History: The patient's family history includes Cancer in her mother; Diabetes in her father; Hypertension in her father.  ROS:   Please see the history of present illness.     EKGs/Labs/Other Studies Reviewed:    The following studies were reviewed today:  Echocardiogram 12/12/2020:  Impressions:   1. Left ventricular ejection fraction, by estimation, is 35 to 40%. The  left ventricle has moderately decreased function. The left ventricle  demonstrates global hypokinesis. There is mild left ventricular  hypertrophy. Left ventricular diastolic  parameters are consistent with Grade II diastolic dysfunction  (pseudonormalization).   2. Right ventricular systolic function is normal. The right ventricular  size is normal.   3. The mitral valve is normal in structure. Trivial mitral valve  regurgitation. No evidence of mitral stenosis.   4. The aortic valve is normal in structure. Aortic valve regurgitation is  not visualized. No aortic stenosis is present.   5. The inferior vena cava is normal in size with greater than 50%  respiratory variability, suggesting right atrial pressure of 3 mmHg.  _______________  Echocardiogram 05/06/2021:  EKG:  EKG not ordered today.   Recent Labs: 12/18/2020: Magnesium  2.3 12/23/2020: BNP 82.8 01/27/2021: ALT 10; Hemoglobin 13.1; Platelets 231 03/06/2021: BUN 19; Creatinine, Ser 1.14; Potassium 4.2; Sodium 133  Recent Lipid Panel    Component Value Date/Time   CHOL 235 (H) 12/26/2018 1209   TRIG 230 (H) 12/26/2018 1209   HDL 51 12/26/2018 1209   CHOLHDL 4.6 12/26/2018 1209   VLDL 23 11/23/2016 1045   LDLCALC 147 (H) 12/26/2018 1209    Physical Exam:    Vital Signs: There were no vitals taken for this visit.    Wt Readings from Last 3 Encounters:  02/11/21 266 lb (120.7 kg)  01/28/21 266 lb 9.6 oz (120.9 kg)  01/06/21 265 lb (120.2 kg)     General: 40 y.o. female in no acute distress. HEENT: Normocephalic and atraumatic. Sclera clear. EOMs intact. Neck: Supple. No carotid bruits. No JVD. Heart: *** RRR. Distinct S1 and S2.  No murmurs, gallops, or rubs. Radial and distal pedal pulses 2+ and equal bilaterally. Lungs: No increased work of breathing. Clear to ausculation bilaterally. No wheezes, rhonchi, or rales.  Abdomen: Soft, non-distended, and non-tender to palpation. Bowel sounds present in all 4 quadrants.  MSK: Normal strength and tone for age. *** Extremities: No lower extremity edema.    Skin: Warm and dry. Neuro: Alert and oriented x3. No focal deficits. Psych: Normal affect. Responds appropriately.   Assessment:    No diagnosis found.  Plan:     Disposition: Follow up in ***   Medication Adjustments/Labs and Tests Ordered: Current medicines are reviewed at length with the patient today.  Concerns regarding medicines are outlined above.  No orders of the defined types were placed in this encounter.  No orders of the defined types were placed in this encounter.   There are no Patient Instructions on file for this visit.   Signed, Corrin Parker, PA-C  05/06/2021 8:32 AM    [facility]

## 2021-05-06 ENCOUNTER — Ambulatory Visit (HOSPITAL_COMMUNITY): Payer: Medicare Other | Attending: Cardiology

## 2021-05-06 ENCOUNTER — Other Ambulatory Visit: Payer: Self-pay

## 2021-05-06 DIAGNOSIS — I5022 Chronic systolic (congestive) heart failure: Secondary | ICD-10-CM | POA: Diagnosis present

## 2021-05-06 LAB — ECHOCARDIOGRAM COMPLETE
Area-P 1/2: 3.33 cm2
S' Lateral: 4.15 cm

## 2021-05-06 MED ORDER — PERFLUTREN LIPID MICROSPHERE
3.0000 mL | INTRAVENOUS | Status: AC | PRN
  Administered 2021-05-06: 3 mL via INTRAVENOUS

## 2021-05-08 ENCOUNTER — Ambulatory Visit: Payer: Medicare Other | Admitting: Student

## 2021-05-11 ENCOUNTER — Encounter (HOSPITAL_BASED_OUTPATIENT_CLINIC_OR_DEPARTMENT_OTHER): Payer: Self-pay | Admitting: Cardiovascular Disease

## 2021-05-11 NOTE — Procedures (Signed)
Patient Name: Sherry Hall, Sherry Hall Study Date: 04/25/2021 Gender: Female D.O.B: May 21, 1981 Age (years): 39 Referring Provider: Ronnald Ramp ONeal Height (inches): 60 Interpreting Physician: Nicki Guadalajara MD, ABSM Weight (lbs): 266 RPSGT: Cherylann Parr BMI: 52 MRN: [id number] Neck Size: 14.00  CLINICAL INFORMATION The patient is referred for a CPAP titration to treat sleep apnea.  Date of HST:  02/12/2021: AHI 9.8/h; O2 nadir 88%  SLEEP STUDY TECHNIQUE As per the AASM Manual for the Scoring of Sleep and Associated Events v2.3 (April 2016) with a hypopnea requiring 4% desaturations.  The channels recorded and monitored were frontal, central and occipital EEG, electrooculogram (EOG), submentalis EMG (chin), nasal and oral airflow, thoracic and abdominal wall motion, anterior tibialis EMG, snore microphone, electrocardiogram, and pulse oximetry. Continuous positive airway pressure (CPAP) was initiated at the beginning of the study and titrated to treat sleep-disordered breathing.  MEDICATIONS albuterol (VENTOLIN HFA) 108 (90 Base) MCG/ACT inhaler ALPRAZolam (XANAX) 1 MG tablet atorvastatin (LIPITOR) 10 MG tablet bictegravir-emtricitabine-tenofovir AF (BIKTARVY) 50-200-25 MG TABS tablet buPROPion (WELLBUTRIN XL) 300 MG 24 hr tablet busPIRone (BUSPAR) 10 MG tablet carvedilol (COREG) 25 MG tablet furosemide (LASIX) 40 MG tablet GLOBAL EASE INJECT PEN NEEDLES 32G X 4 MM MISC glucose blood (ACCU-CHEK GUIDE) test strip insulin aspart (NOVOLOG) 100 UNIT/ML FlexPen Insulin Glargine (LANTUS SOLOSTAR) 100 UNIT/ML Solostar Pen JARDIANCE 10 MG TABS tablet ondansetron (ZOFRAN ODT) 4 MG disintegrating tablet sacubitril-valsartan (ENTRESTO) 97-103 MG sertraline (ZOLOFT) 100 MG tablet spironolactone (ALDACTONE) 25 MG tablet traZODone (DESYREL) 50 MG tablet Medications self-administered by patient taken the night of the study : N/A  TECHNICIAN COMMENTS Comments added by technician:  PATIENT DECIDES TO END THE STUDY DUE TO DIARRHEA AND STOMACH CRAMPS. Comments added by scorer: N/A  RESPIRATORY PARAMETERS Optimal PAP Pressure (cm):  AHI at Optimal Pressure (/hr): N/A Overall Minimal O2 (%): 96.0 Supine % at Optimal Pressure (%): N/A Minimal O2 at Optimal Pressure (%): 96.0   SLEEP ARCHITECTURE The study was initiated at 11:07:42 PM and ended at 11:30:31 PM.  Sleep onset time was 0.3 minutes and the sleep efficiency was 15.3%%. The total sleep time was 3.5 minutes.  The patient spent 14.3%% of the night in stage N1 sleep, 85.7%% in stage N2 sleep, 0.0%% in stage N3 and 0% in REM.Stage REM latency was N/A minutes  Wake after sleep onset was 19.0. Alpha intrusion was absent. Supine sleep was 0.00%.  CARDIAC DATA The 2 lead EKG demonstrated sinus rhythm. The mean heart rate was 73.7 beats per minute. Other EKG findings include: None.  LEG MOVEMENT DATA The total Periodic Limb Movements of Sleep (PLMS) were 0. The PLMS index was 0.0. A PLMS index of <15 is considered normal in adults.  IMPRESSIONS - Inadequate CPAP titration study due to minimal sleep time of 3.5 minutes out of only 22.5 sleep period time; sleep efficiency 15.3%. The study was ended prematurely by the patient due stomach cramps and diarrhea.  - An optimal PAP pressure could not be selected for this patient based on the available study data. - Significant oxygen desaturations were not observed during this titration (min O2 96.0%). - No snoring was audible during this study. - No cardiac abnormalities were observed during this study. - Clinically significant periodic limb movements were not noted during this study. Arousals associated with PLMs were rare.  DIAGNOSIS - Obstructive Sleep Apnea (G47.33)  RECOMMENDATIONS - Recommend a trial of CPAP Auto with EPR of 3 at 6-15 cm H2O with heated humidification.   -  Effort should be made to optimize nasal and oropharyngeal patency. - Avoid alcohol,  sedatives and other CNS depressants that may worsen sleep apnea and disrupt normal sleep architecture. - Sleep hygiene should be reviewed to assess factors that may improve sleep quality. - Weight management (BMI 52) and regular exercise should be initiated or continued. - Recommend a download in 30 days and sleep clinic evaluation after 4 weeks of therapy.   [Electronically signed] 05/11/2021 12:22 PM  Nicki Guadalajara MD, [facility], ABSM Diplomate, American Board of Sleep Medicine   NPI: [id number] [facility] PH: [phone]   FX: [phone] ACCREDITED BY THE AMERICAN ACADEMY OF SLEEP MEDICINE

## 2021-05-13 ENCOUNTER — Other Ambulatory Visit: Payer: Self-pay | Admitting: Infectious Diseases

## 2021-05-13 ENCOUNTER — Other Ambulatory Visit: Payer: Self-pay

## 2021-05-13 ENCOUNTER — Ambulatory Visit (INDEPENDENT_AMBULATORY_CARE_PROVIDER_SITE_OTHER): Payer: Medicare Other | Admitting: Family Medicine

## 2021-05-13 ENCOUNTER — Other Ambulatory Visit: Payer: Self-pay | Admitting: Cardiovascular Disease

## 2021-05-13 ENCOUNTER — Encounter: Payer: Self-pay | Admitting: Family Medicine

## 2021-05-13 VITALS — BP 139/80 | HR 90 | Temp 97.5°F | Ht 60.0 in | Wt 275.0 lb

## 2021-05-13 DIAGNOSIS — Z794 Long term (current) use of insulin: Secondary | ICD-10-CM

## 2021-05-13 DIAGNOSIS — B20 Human immunodeficiency virus [HIV] disease: Secondary | ICD-10-CM | POA: Diagnosis not present

## 2021-05-13 DIAGNOSIS — Z6841 Body Mass Index (BMI) 40.0 and over, adult: Secondary | ICD-10-CM

## 2021-05-13 DIAGNOSIS — I10 Essential (primary) hypertension: Secondary | ICD-10-CM

## 2021-05-13 DIAGNOSIS — E118 Type 2 diabetes mellitus with unspecified complications: Secondary | ICD-10-CM

## 2021-05-13 DIAGNOSIS — E785 Hyperlipidemia, unspecified: Secondary | ICD-10-CM

## 2021-05-13 DIAGNOSIS — H6592 Unspecified nonsuppurative otitis media, left ear: Secondary | ICD-10-CM

## 2021-05-13 LAB — POCT URINALYSIS DIPSTICK
Bilirubin, UA: NEGATIVE
Glucose, UA: POSITIVE — AB
Ketones, UA: NEGATIVE
Leukocytes, UA: NEGATIVE
Nitrite, UA: NEGATIVE
Protein, UA: POSITIVE — AB
Spec Grav, UA: 1.02 (ref 1.010–1.025)
Urobilinogen, UA: 0.2 E.U./dL
pH, UA: 6 (ref 5.0–8.0)

## 2021-05-13 LAB — POCT GLYCOSYLATED HEMOGLOBIN (HGB A1C)
HbA1c POC (<> result, manual entry): 7.8 % (ref 4.0–5.6)
HbA1c, POC (controlled diabetic range): 7.8 % — AB (ref 0.0–7.0)
HbA1c, POC (prediabetic range): 7.8 % — AB (ref 5.7–6.4)
Hemoglobin A1C: 7.8 % — AB (ref 4.0–5.6)

## 2021-05-13 MED ORDER — NEOMYCIN-POLYMYXIN-HC 3.5-10000-1 OT SUSP: 3.0000 [drp] | Freq: Four times a day (QID) | OTIC | 0 refills | Status: AC

## 2021-05-13 NOTE — Patient Instructions (Signed)
Otitis Media, Adult  Otitis media is a condition in which the middle ear is red and swollen (inflamed) and full of fluid. The middle ear is the part of the ear that contains bones for hearing as well as air that helps send sounds to the brain. The conditionusually goes away on its own. What are the causes? This condition is caused by a blockage in the eustachian tube. The eustachian tube connects the middle ear to the back of the nose. It normally allows air into the middle ear. The blockage is caused by fluid or swelling. Problems that can cause blockage include: A cold or infection that affects the nose, mouth, or throat. Allergies. An irritant, such as tobacco smoke. Adenoids that have become large. The adenoids are soft tissue located in the back of the throat, behind the nose and the roof of the mouth. Growth or swelling in the upper part of the throat, just behind the nose (nasopharynx). Damage to the ear caused by change in pressure. This is called barotrauma. What are the signs or symptoms? Symptoms of this condition include: Ear pain. Fever. Problems with hearing. Being tired. Fluid leaking from the ear. Ringing in the ear. How is this treated? This condition can go away on its own within 3-5 days. But if the condition is caused by bacteria or does not go away on its own, or if it keeps coming back, your doctor may: Give you antibiotic medicines. Give you medicines for pain. Follow these instructions at home: Take over-the-counter and prescription medicines only as told by your doctor. If you were prescribed an antibiotic medicine, take it as told by your doctor. Do not stop taking the antibiotic even if you start to feel better. Keep all follow-up visits as told by your doctor. This is important. Contact a doctor if: You have bleeding from your nose. There is a lump on your neck. You are not feeling better in 5 days. You feel worse instead of better. Get help right away  if: You have pain that is not helped with medicine. You have swelling, redness, or pain around your ear. You get a stiff neck. You cannot move part of your face (paralysis). You notice that the bone behind your ear hurts when you touch it. You get a very bad headache. Summary Otitis media means that the middle ear is red, swollen, and full of fluid. This condition usually goes away on its own. If the problem does not go away, treatment may be needed. You may be given medicines to treat the infection or to treat your pain. If you were prescribed an antibiotic medicine, take it as told by your doctor. Do not stop taking the antibiotic even if you start to feel better. Keep all follow-up visits as told by your doctor. This is important. This information is not intended to replace advice given to you by your health care provider. Make sure you discuss any questions you have with your healthcare provider. Document Revised: 09/07/2019 Document Reviewed: 09/07/2019 Elsevier Patient Education  2022 [employer]  

## 2021-05-13 NOTE — Progress Notes (Signed)
Patient [facility]   Established Patient Office Visit  Subjective:  Patient ID: Sherry Hall, female    DOB: 1981/03/12  Age: 40 y.o. MRN: [id number]  CC:  Chief Complaint  Patient presents with   Ear Pain    Left ear pain, started Thursday. Muffled hearing pain down into jaw, Took advil with some relief.     HPI Sherry Hall is a 40 year old female with a medical history significant for type 2 diabetes mellitus, hypertension, HIV, morbid obesity, and tobacco dependence that presents for follow-up of chronic conditions.  Patient says that she has been doing well and is without complaint.  She has been monitoring blood sugars closely over the past several months.  Patient states that she has not done a very good job following a low-fat, low carbohydrate diet.  She is having a tough time following diet due to financial constraints.  She states that healthy fluids is very expensive.  Patient has not been exercising consistently.  However, she states that she remains very active.  She denies any chest pain, shortness of breath, orthopnea, polyuria, polydipsia, or polyphagia.  Patient is complaining of left ear pain over the past week.  Middle ear fluid has been persistent for 4 days.  The last ear infection was a few years ago.  No nasal symptoms present.  A hearing problem is not suspected by history.  Past Medical History:  Diagnosis Date   Anemia    H/O   Anxiety    Depression    Diabetes mellitus type 1 ([place])    GERD (gastroesophageal reflux disease)    HIV (human immunodeficiency virus infection) ([place]) 2011   Hypertension    Type 2 diabetes mellitus [facility])     Past Surgical History:  Procedure Laterality Date   CESAREAN SECTION  2001, 2008, 2009, 2011   CHOLECYSTECTOMY     EXCISION OF BREAST LESION N/A 01/04/2018   Procedure: EXCISION OF CHEST WALL ABSCESS. MIDDLE CHEST;  Surgeon: Olean Ree, MD;  Location: [facility] ORS;   Service: General;  Laterality: N/A;  resection of chest wall cyst / abscess   FRACTURE SURGERY Right    HAND   TONSILLECTOMY     TUBAL LIGATION  2011    Family History  Problem Relation Age of Onset   Diabetes Father    Hypertension Father    Cancer Mother        cervical     Social History   Socioeconomic History   Marital status: Single    Spouse name: Not on file   Number of children: Not on file   Years of education: Not on file   Highest education level: Not on file  Occupational History   Not on file  Tobacco Use   Smoking status: Every Day    Packs/day: 0.50    Years: 15.00    Pack years: 7.50    Types: Cigarettes   Smokeless tobacco: Never   Tobacco comments:    trying to slow  Vaping Use   Vaping Use: Never used  Substance and Sexual Activity   Alcohol use: Yes    Alcohol/week: 1.0 standard drink    Types: 1 Standard drinks or equivalent per week    Comment: socially   Drug use: No   Sexual activity: Yes    Partners: Male    Birth control/protection: Surgical    Comment: declined condoms 12/2020  Other Topics Concern   Not on file  Social History Narrative   Not on file   Social Determinants of Health   Financial Resource Strain: Not on file  Food Insecurity: Not on file  Transportation Needs: Not on file  Physical Activity: Not on file  Stress: Not on file  Social Connections: Not on file  Intimate Partner Violence: Not on file    Outpatient Medications Prior to Visit  Medication Sig Dispense Refill   Accu-Chek FastClix Lancets MISC 1 Units by Does not apply route 4 (four) times daily as needed. 102 each 12   albuterol (VENTOLIN HFA) 108 (90 Base) MCG/ACT inhaler Inhale 2 puffs into the lungs every 4 (four) hours as needed for wheezing or shortness of breath. 8 g 0   ALPRAZolam (XANAX) 1 MG tablet Take 1 mg by mouth 2 (two) times daily as needed for anxiety (panic attack).     atorvastatin (LIPITOR) 10 MG tablet TAKE ONE TABLET BY MOUTH  DAILY 90 tablet 0   bictegravir-emtricitabine-tenofovir AF (BIKTARVY) 50-200-25 MG TABS tablet Take 1 tablet by mouth daily. 30 tablet 2   blood glucose meter kit and supplies KIT Dispense based on patient and insurance preference. Use up to four times daily as directed. (FOR ICD-9 250.00, 250.01). 1 each 0   Blood Glucose Monitoring Suppl (ACCU-CHEK GUIDE ME) w/Device KIT 1 Units by Does not apply route 4 (four) times daily as needed. 1 kit 0   Blood Pressure Monitoring (BLOOD PRESSURE CUFF) MISC 1 Units by Does not apply route daily. Please dispense on EXTRA LARGE (up to 52cm) blood pressure cuff kit 1 each 0   buPROPion (WELLBUTRIN XL) 300 MG 24 hr tablet Take 300 mg by mouth daily.     busPIRone (BUSPAR) 10 MG tablet Take 10 mg by mouth 3 (three) times daily.     carvedilol (COREG) 25 MG tablet Take 1 tablet (25 mg total) by mouth 2 (two) times daily. 180 tablet 3   furosemide (LASIX) 40 MG tablet TAKE HALF TABLET BY MOUTH DAILY AS NEEDED 30 tablet 1   GLOBAL EASE INJECT PEN NEEDLES 32G X 4 MM MISC USE AS DIRECTED TO ADMINISTER INSULIN THREE TIMES DAILY BEFORE MEALS 100 each 9   glucose blood (ACCU-CHEK GUIDE) test strip Use 3 times a day. Dx code: E11.9 100 each 12   insulin aspart (NOVOLOG) 100 UNIT/ML FlexPen Per sliding scale. Max daily dose 40 units. (Patient taking differently: Inject 2-10 Units into the skin See admin instructions. Injects 2-10 units into the skin per sliding scale. Max daily dose 40 units. 150-199 mg/dl : 2 units; 200-249 mg/dL: 4 units; 250 - 299 mg/dL: 6 units; 300 - 349 mg/dL: 8 units; 350 -399 mg/ dL: 10 units) 15 mL 11   Insulin Glargine (LANTUS SOLOSTAR) 100 UNIT/ML Solostar Pen INJECT 30 UNITS INTO THE SKIN DAILY AT 10 IN THE EVENING (Patient taking differently: Inject 30 Units into the skin daily.) 15 mL 11   JARDIANCE 10 MG TABS tablet TAKE ONE TABLET BY MOUTH DAILY 30 tablet 0   ondansetron (ZOFRAN ODT) 4 MG disintegrating tablet Take 1 tablet (4 mg total) by  mouth every 8 (eight) hours as needed for nausea or vomiting. 20 tablet 0   sacubitril-valsartan (ENTRESTO) 97-103 MG Take 1 tablet by mouth 2 (two) times daily. 180 tablet 3   sertraline (ZOLOFT) 100 MG tablet Take by mouth daily.     spironolactone (ALDACTONE) 25 MG tablet TAKE HALF TABLET BY MOUTH AT BEDTIME 15 tablet 0   traZODone (DESYREL)  50 MG tablet Take 50-100 mg by mouth at bedtime as needed for sleep.   1   No facility-administered medications prior to visit.    Allergies  Allergen Reactions   Percocet [Oxycodone-Acetaminophen] Hives, Itching and Rash   Latex Rash    ROS Review of Systems  Constitutional:  Negative for fatigue and fever.  HENT: Negative.    Eyes: Negative.   Respiratory: Negative.    Cardiovascular: Negative.   Gastrointestinal: Negative.   Endocrine: Negative for polydipsia, polyphagia and polyuria.  Genitourinary: Negative.   Musculoskeletal: Negative.   Skin: Negative.   Neurological: Negative.      Objective:    Physical Exam Constitutional:      Appearance: She is obese.  HENT:     Left Ear: Decreased hearing noted. Tenderness present. A middle ear effusion is present. Tympanic membrane is injected.  Eyes:     Pupils: Pupils are equal, round, and reactive to light.  Cardiovascular:     Rate and Rhythm: Normal rate and regular rhythm.  Pulmonary:     Effort: Pulmonary effort is normal.  Abdominal:     General: Bowel sounds are normal.  Psychiatric:        Mood and Affect: Mood normal.        Thought Content: Thought content normal.        Judgment: Judgment normal.    BP 139/80 (BP Location: Right Arm, Patient Position: Sitting)   Pulse 90   Temp (!) 97.5 F (36.4 C)   Ht 5' (1.524 m)   Wt 275 lb 0.5 oz (124.8 kg)   SpO2 100%   BMI 53.71 kg/m  Wt Readings from Last 3 Encounters:  05/13/21 275 lb 0.5 oz (124.8 kg)  02/11/21 266 lb (120.7 kg)  01/28/21 266 lb 9.6 oz (120.9 kg)     Health Maintenance Due  Topic Date Due    COVID-19 Vaccine (1) Never done   PAP SMEAR-Modifier  Never done   Pneumococcal Vaccine 59-32 Years old (3 - PPSV23 or PCV20) 01/02/2017   OPHTHALMOLOGY EXAM  04/06/2017    There are no preventive care reminders to display for this patient.  Lab Results  Component Value Date   TSH 1.42 07/08/2016   Lab Results  Component Value Date   WBC 6.7 01/27/2021   HGB 13.1 01/27/2021   HCT 39.5 01/27/2021   MCV 95 01/27/2021   PLT 231 01/27/2021   Lab Results  Component Value Date   NA 133 (L) 03/06/2021   K 4.2 03/06/2021   CO2 26 03/06/2021   GLUCOSE 321 (H) 03/06/2021   BUN 19 03/06/2021   CREATININE 1.14 (H) 03/06/2021   BILITOT 0.2 01/27/2021   ALKPHOS 67 01/27/2021   AST 7 01/27/2021   ALT 10 01/27/2021   PROT 7.9 01/27/2021   ALBUMIN 3.7 (L) 01/27/2021   CALCIUM 8.5 (L) 03/06/2021   ANIONGAP 8 12/18/2020   EGFR 62 03/06/2021   Lab Results  Component Value Date   CHOL 235 (H) 12/26/2018   Lab Results  Component Value Date   HDL 51 12/26/2018   Lab Results  Component Value Date   LDLCALC 147 (H) 12/26/2018   Lab Results  Component Value Date   TRIG 230 (H) 12/26/2018   Lab Results  Component Value Date   CHOLHDL 4.6 12/26/2018   Lab Results  Component Value Date   HGBA1C 7.8 (A) 05/13/2021   HGBA1C 7.8 05/13/2021   HGBA1C 7.8 (A) 05/13/2021   HGBA1C  7.8 (A) 05/13/2021      Assessment & Plan:   Problem List Items Addressed This Visit       Other   HIV disease ([place]) (Chronic)   Other Visit Diagnoses     Type 2 diabetes mellitus with complication, with long-term current use of insulin ([place])    -  Primary   Relevant Orders   Urinalysis Dipstick (Completed)   HgB A1c (Completed)   Basic Metabolic Panel   Amb Referral to Bariatric Surgery   Essential hypertension       Relevant Orders   Urinalysis Dipstick (Completed)   Basic Metabolic Panel   Amb Referral to Bariatric Surgery   Morbid obesity with BMI of 50.0-59.9, adult ([place])        Relevant Orders   Amb Referral to Bariatric Surgery   Left otitis media with effusion       Relevant Medications   neomycin-polymyxin-hydrocortisone (CORTISPORIN) 3.5-10000-1 OTIC suspension       Follow-up: Return in about 6 months (around 11/13/2021) for hypertension, obesity.    Cammie Sickle, FNP

## 2021-05-14 LAB — BASIC METABOLIC PANEL WITH GFR
BUN/Creatinine Ratio: 19 (ref 9–23)
BUN: 17 mg/dL (ref 6–24)
CO2: 19 mmol/L — ABNORMAL LOW (ref 20–29)
Calcium: 8.7 mg/dL (ref 8.7–10.2)
Chloride: 101 mmol/L (ref 96–106)
Creatinine, Ser: 0.89 mg/dL (ref 0.57–1.00)
Glucose: 158 mg/dL — ABNORMAL HIGH (ref 65–99)
Potassium: 4.3 mmol/L (ref 3.5–5.2)
Sodium: 137 mmol/L (ref 134–144)
eGFR: 83 mL/min/1.73

## 2021-05-21 ENCOUNTER — Telehealth: Payer: Self-pay | Admitting: *Deleted

## 2021-05-21 NOTE — Telephone Encounter (Signed)
Called patient to notify her that her sleep study has been completed. Received a message that her voicemail has not been set up. My Chart message will be sent to patient.Order for CPAP machine will be sent to [facility].

## 2021-05-22 NOTE — Telephone Encounter (Signed)
Third attempt to r/s missed apt

## 2021-06-04 ENCOUNTER — Telehealth: Payer: Self-pay | Admitting: *Deleted

## 2021-06-04 NOTE — Telephone Encounter (Signed)
Called to inform patient that her CPAP titration has been completed. CPAP will be ordered.  No answer. Could not leave a message due to mailbox being full. I will sent her a My chart message.

## 2021-06-04 NOTE — Telephone Encounter (Signed)
-----   Message from Lennette Bihari, MD sent at 05/11/2021 12:30 PM EDT ----- Burna Mortimer, please notify pt and due to inadequate titration set up with DME for CPAP Auto with EPR of 3 at 6-15 cm

## 2021-06-05 ENCOUNTER — Telehealth: Payer: Self-pay | Admitting: *Deleted

## 2021-06-05 NOTE — Telephone Encounter (Signed)
Called patient again to inform her that her CPAP titration has been completed. She did answer the phone and was verbally notified that her machine order has been sent to [facility]. She was also informed of the national back order and it can take up to 6 months for her to get her machine. If anything changes in her condition she is to call and notify us. Patient voiced her understanding and thanked me for calling her.

## 2021-06-24 ENCOUNTER — Other Ambulatory Visit: Payer: Self-pay | Admitting: Infectious Diseases

## 2021-06-24 DIAGNOSIS — B20 Human immunodeficiency virus [HIV] disease: Secondary | ICD-10-CM

## 2021-06-28 NOTE — Progress Notes (Deleted)
Cardiology Office Note:    Date:  06/28/2021   ID:  Sherry Hall, DOB 1981-04-17, MRN [id number]  PCP:  Massie Maroon, FNP  Cardiologist:  Reatha Harps, MD  Electrophysiologist:  None   Referring MD: Massie Maroon, FNP   Chief Complaint: follow-up of CHF  History of Present Illness:    Sherry Hall is a 40 y.o. 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 Flora Lipps and presents today for routine follow-up.   Patient was first seen by Cardiology for new CHF during an admission in 11/2020. Echo showed LVEF of 35-40% with global hypokinesis and grade 2 diastolic dysfunction. RV normal. No significant valvular disease. Chest CTA during admission to rule out PE was negative for coronary calcium so this was felt to be a nonischemic cardiomyopathy due to hypertension, diabetes, and HIV. She was started on GDMT with Entresto, Coreg, Spironolactone, and Jardiance. At outpatient follow-up, sleep study was ordered which showed mild obstructive sleep apnea and she was started on CPAP ***. Patient was last seen by Dr. Flora Lipps in 01/2021 at which time she was overall doing really well. Coreg was increased. Since then she has also been seen by PharmD for further medication titration. Current medication regimen is Lasix 20mg  daily PRN for weight gain, Entresto 97-103mg  twice daily, Coreg 25mg  twice daily, Spironolactone 25mg  daily, and Jardiance 10mg  daily.   Repeat Echo on 05/06/2021 showed LVEF of 45-50% with hypokinesis of inferior/septal walls, normal RV, and mild mitral stenosis.  Nonischemic Cardiomyopathy  Chronic Combined CHF  - Initial diagnosed in 11/2020. LVEF of 35-40% at that time. Chest CTA for PE at that time showed no coronary calcium so felt to be nonischemic cardiomyopathy from HTN, DM, and HIV.  - Recent Echo on  05/06/2021 showed improved EF of 45-50% - Euvolemic on exam.  - Continue PRN Lasix 20mg  daily for weight gain.  - Continue Entresto 97-103mg  twice daily.  - Continue Coreg 25mg  twice daily.  - Continue Spironolactone 25mg  daily.  - Continue Jardiance 10mg  daily.  - Discussed importance of daily weights and sodium/fluid restrictions.   Hypertension  - Well controlled.  - Continue medications for CHF as above.   Hyperlipidemia  - *** - LDL goal <100.  - Continue Lipitor 10mg  daily.   Obstructive Sleep Apnea  - CPAP ***  Morbid Obesity  - BMI *** - Dr. has already referred patient to [facility] for surgery consult.  Past Medical History:  Diagnosis Date   Anemia    H/O   Anxiety    Depression    Diabetes mellitus type 1 ([facility])    GERD (gastroesophageal reflux disease)    HIV (human immunodeficiency virus infection) ([facility]) 2011   Hypertension    Type 2 diabetes mellitus [facility])     Past Surgical History:  Procedure Laterality Date   CESAREAN SECTION  2001, 2008, 2009, 2011   CHOLECYSTECTOMY     EXCISION OF BREAST LESION N/A 01/04/2018   Procedure: EXCISION OF CHEST WALL ABSCESS. MIDDLE CHEST;  Surgeon: , MD;  Location: [facility] ORS;  Service: General;  Laterality: N/A;  resection of chest wall cyst / abscess   FRACTURE SURGERY Right    HAND   TONSILLECTOMY     TUBAL LIGATION  2011    Current Medications: No outpatient medications have been marked as taking for the  07/04/21 encounter (Appointment) with Corrin Parker, PA-C.     Allergies:   Percocet [oxycodone-acetaminophen] and Latex   Social History   Socioeconomic History   Marital status: Single    Spouse name: Not on file   Number of children: Not on file   Years of education: Not on file   Highest education level: Not on file  Occupational History   Not on file  Tobacco Use   Smoking status: Every Day    Packs/day: 0.50    Years: 15.00    Pack years: 7.50     Types: Cigarettes   Smokeless tobacco: Never   Tobacco comments:    trying to slow  Vaping Use   Vaping Use: Never used  Substance and Sexual Activity   Alcohol use: Yes    Alcohol/week: 1.0 standard drink    Types: 1 Standard drinks or equivalent per week    Comment: socially   Drug use: No   Sexual activity: Yes    Partners: Male    Birth control/protection: Surgical    Comment: declined condoms 12/2020  Other Topics Concern   Not on file  Social History Narrative   Not on file   Social Determinants of Health   Financial Resource Strain: Not on file  Food Insecurity: Not on file  Transportation Needs: Not on file  Physical Activity: Not on file  Stress: Not on file  Social Connections: Not on file     Family History: The patient's family history includes Cancer in her mother; Diabetes in her father; Hypertension in her father.  ROS:   Please see the history of present illness.     EKGs/Labs/Other Studies Reviewed:    The following studies were reviewed today:  Echocardiogram 12/11/2020: Impressions:  1. Left ventricular ejection fraction, by estimation, is 35 to 40%. The  left ventricle has moderately decreased function. The left ventricle  demonstrates global hypokinesis. There is mild left ventricular  hypertrophy. Left ventricular diastolic  parameters are consistent with Grade II diastolic dysfunction  (pseudonormalization).   2. Right ventricular systolic function is normal. The right ventricular  size is normal.   3. The mitral valve is normal in structure. Trivial mitral valve  regurgitation. No evidence of mitral stenosis.   4. The aortic valve is normal in structure. Aortic valve regurgitation is  not visualized. No aortic stenosis is present.   5. The inferior vena cava is normal in size with greater than 50%  respiratory variability, suggesting right atrial pressure of 3 mmHg.     LV Wall Scoring:  The inferior wall, basal anteroseptal segment,  and mid inferoseptal  segment are akinetic.  _______________  Echocardiogram 05/06/2021: Impressions: 1. Left ventricular ejection fraction, by estimation, is 45 to 50%. The  left ventricle has mildly decreased function. The left ventricle  demonstrates regional wall motion abnormalities (see scoring  diagram/findings for description). Inferior/septal  hypokinesis. There is mild left ventricular hypertrophy. Left ventricular  diastolic parameters are indeterminate.   2. Right ventricular systolic function is normal. The right ventricular  size is normal. Tricuspid regurgitation signal is inadequate for assessing  PA pressure.   3. The mitral valve is normal in structure. No evidence of mitral valve  regurgitation. Mild mitral stenosis.   4. The aortic valve is tricuspid. Aortic valve regurgitation is not  visualized. No aortic stenosis is present.   5. The inferior vena cava is normal in size with greater than 50%  respiratory variability, suggesting right atrial pressure  of 3 mmHg.   Comparison(s): Compared to prior echo 12/15/20, LV systolic function has improved.   EKG:  EKG *** ordered today. EKG personally reviewed and demonstrates ***.  Recent Labs: 12/18/2020: Magnesium 2.3 12/23/2020: BNP 82.8 01/27/2021: ALT 10; Hemoglobin 13.1; Platelets 231 05/13/2021: BUN 17; Creatinine, Ser 0.89; Potassium 4.3; Sodium 137  Recent Lipid Panel    Component Value Date/Time   CHOL 235 (H) 12/26/2018 1209   TRIG 230 (H) 12/26/2018 1209   HDL 51 12/26/2018 1209   CHOLHDL 4.6 12/26/2018 1209   VLDL 23 11/23/2016 1045   LDLCALC 147 (H) 12/26/2018 1209    Physical Exam:    Vital Signs: There were no vitals taken for this visit.    Wt Readings from Last 3 Encounters:  05/13/21 275 lb 0.5 oz (124.8 kg)  02/11/21 266 lb (120.7 kg)  01/28/21 266 lb 9.6 oz (120.9 kg)     General: 40 y.o. female in no acute distress. HEENT: Normocephalic and atraumatic. Sclera clear. EOMs intact. Neck:  Supple. No carotid bruits. No JVD. Heart: *** RRR. Distinct S1 and S2. No murmurs, gallops, or rubs. Radial and distal pedal pulses 2+ and equal bilaterally. Lungs: No increased work of breathing. Clear to ausculation bilaterally. No wheezes, rhonchi, or rales.  Abdomen: Soft, non-distended, and non-tender to palpation. Bowel sounds present in all 4 quadrants.  MSK: Normal strength and tone for age. *** Extremities: No lower extremity edema.    Skin: Warm and dry. Neuro: Alert and oriented x3. No focal deficits. Psych: Normal affect. Responds appropriately.   Assessment:    No diagnosis found.  Plan:     Disposition: Follow up in ***   Medication Adjustments/Labs and Tests Ordered: Current medicines are reviewed at length with the patient today.  Concerns regarding medicines are outlined above.  No orders of the defined types were placed in this encounter.  No orders of the defined types were placed in this encounter.   There are no Patient Instructions on file for this visit.   Signed, Corrin Parker, PA-C  06/28/2021 1:40 PM    [facility]

## 2021-07-01 ENCOUNTER — Other Ambulatory Visit: Payer: Self-pay

## 2021-07-01 ENCOUNTER — Ambulatory Visit (INDEPENDENT_AMBULATORY_CARE_PROVIDER_SITE_OTHER): Payer: Medicare Other | Admitting: Family Medicine

## 2021-07-01 ENCOUNTER — Encounter: Payer: Self-pay | Admitting: Family Medicine

## 2021-07-01 VITALS — BP 136/76 | HR 75 | Temp 97.4°F | Ht 62.0 in | Wt 271.8 lb

## 2021-07-01 DIAGNOSIS — T192XXA Foreign body in vulva and vagina, initial encounter: Secondary | ICD-10-CM | POA: Diagnosis not present

## 2021-07-01 DIAGNOSIS — Z794 Long term (current) use of insulin: Secondary | ICD-10-CM | POA: Diagnosis not present

## 2021-07-01 DIAGNOSIS — E118 Type 2 diabetes mellitus with unspecified complications: Secondary | ICD-10-CM | POA: Diagnosis not present

## 2021-07-01 LAB — POCT GLYCOSYLATED HEMOGLOBIN (HGB A1C)
HbA1c POC (<> result, manual entry): 8.1 % (ref 4.0–5.6)
HbA1c, POC (controlled diabetic range): 8.1 % — AB (ref 0.0–7.0)
HbA1c, POC (prediabetic range): 8.1 % — AB (ref 5.7–6.4)
Hemoglobin A1C: 8.1 % — AB (ref 4.0–5.6)

## 2021-07-01 LAB — POCT URINALYSIS DIP (CLINITEK)
Bilirubin, UA: NEGATIVE
Glucose, UA: 500 mg/dL — AB
Ketones, POC UA: NEGATIVE mg/dL
Leukocytes, UA: NEGATIVE
Nitrite, UA: NEGATIVE
Spec Grav, UA: 1.015 (ref 1.010–1.025)
Urobilinogen, UA: 0.2 E.U./dL
pH, UA: 6 (ref 5.0–8.0)

## 2021-07-01 LAB — GLUCOSE, POCT (MANUAL RESULT ENTRY): POC Glucose: 229 mg/dl — AB (ref 70–99)

## 2021-07-04 ENCOUNTER — Ambulatory Visit: Payer: Medicare Other | Admitting: Student

## 2021-07-18 ENCOUNTER — Encounter: Payer: Self-pay | Admitting: Nurse Practitioner

## 2021-07-18 ENCOUNTER — Ambulatory Visit (HOSPITAL_BASED_OUTPATIENT_CLINIC_OR_DEPARTMENT_OTHER)
Admission: RE | Admit: 2021-07-18 | Discharge: 2021-07-18 | Disposition: A | Discharge status: Home / Self Care | Payer: Medicare Other | Source: Ambulatory Visit | Attending: Nurse Practitioner | Admitting: Nurse Practitioner

## 2021-07-18 ENCOUNTER — Ambulatory Visit (INDEPENDENT_AMBULATORY_CARE_PROVIDER_SITE_OTHER): Payer: Medicare Other | Admitting: Nurse Practitioner

## 2021-07-18 ENCOUNTER — Other Ambulatory Visit: Payer: Self-pay | Admitting: Nurse Practitioner

## 2021-07-18 ENCOUNTER — Other Ambulatory Visit: Payer: Self-pay

## 2021-07-18 ENCOUNTER — Encounter (HOSPITAL_BASED_OUTPATIENT_CLINIC_OR_DEPARTMENT_OTHER): Payer: Self-pay

## 2021-07-18 VITALS — BP 158/81 | HR 90 | Temp 96.4°F | Ht 60.0 in | Wt 280.6 lb

## 2021-07-18 DIAGNOSIS — R1030 Lower abdominal pain, unspecified: Secondary | ICD-10-CM | POA: Insufficient documentation

## 2021-07-18 DIAGNOSIS — R198 Other specified symptoms and signs involving the digestive system and abdomen: Secondary | ICD-10-CM

## 2021-07-18 DIAGNOSIS — K5909 Other constipation: Secondary | ICD-10-CM | POA: Diagnosis present

## 2021-07-18 DIAGNOSIS — R110 Nausea: Secondary | ICD-10-CM

## 2021-07-18 DIAGNOSIS — R5383 Other fatigue: Secondary | ICD-10-CM

## 2021-07-18 DIAGNOSIS — K529 Noninfective gastroenteritis and colitis, unspecified: Secondary | ICD-10-CM

## 2021-07-18 LAB — POCT URINALYSIS DIP (CLINITEK)
Bilirubin, UA: NEGATIVE
Glucose, UA: 500 mg/dL — AB
Ketones, POC UA: NEGATIVE mg/dL
Leukocytes, UA: NEGATIVE
Nitrite, UA: NEGATIVE
POC PROTEIN,UA: 100 — AB
Spec Grav, UA: 1.01 (ref 1.010–1.025)
Urobilinogen, UA: 0.2 E.U./dL
pH, UA: 5.5 (ref 5.0–8.0)

## 2021-07-18 LAB — POCT I-STAT CREATININE: Creatinine, Ser: 0.9 mg/dL (ref 0.44–1.00)

## 2021-07-18 MED ORDER — DOXYCYCLINE MONOHYDRATE 100 MG PO TABS
100.0000 mg | ORAL_TABLET | Freq: Two times a day (BID) | ORAL | 0 refills | Status: AC
Start: 2021-07-18 — End: 2021-07-25

## 2021-07-18 MED ORDER — POLYETHYLENE GLYCOL 3350 17 GM/SCOOP PO POWD: 17.0000 g | Freq: Two times a day (BID) | ORAL | 1 refills | Status: DC | PRN

## 2021-07-18 MED ORDER — IOHEXOL 350 MG/ML SOLN
80.0000 mL | Freq: Once | INTRAVENOUS | Status: AC | PRN
  Administered 2021-07-18: 80 mL via INTRAVENOUS

## 2021-07-18 NOTE — Progress Notes (Signed)
Patient [facility]   Established Patient Office Visit  Subjective:  Patient ID: Sherry Hall, female    DOB: 18-Dec-1980  Age: 40 y.o. MRN: [id number]  CC:  Chief Complaint  Patient presents with   Foreign Body in Vagina    Pt is here today to discuss the tampon that has been stuck in her vagina x 1 day. Pt states that she is having cramping in her lower back.    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  Patient states that she started her menstrual cycle 3 days ago, which has been moderate.   Patient mostly uses tampons with menses and recently switched her brand.  She states that upon awakening she could not find the string attached to the tampon.  Patient states that she searched with the assistance of her husband but did not locate tampon.  She does not remember expelling tampon with urination.  Patient currently denies any fever, chills, abdominal pain, foul odor, abnormal vaginal discharge, or dysuria.  Past Medical History:  Diagnosis Date   Anemia    H/O   Anxiety    Depression    Diabetes mellitus type 1 ([place])    GERD (gastroesophageal reflux disease)    HIV (human immunodeficiency virus infection) ([place]) 2011   Hypertension    Type 2 diabetes mellitus [facility])     Past Surgical History:  Procedure Laterality Date   CESAREAN SECTION  2001, 2008, 2009, 2011   CHOLECYSTECTOMY     EXCISION OF BREAST LESION N/A 01/04/2018   Procedure: EXCISION OF CHEST WALL ABSCESS. MIDDLE CHEST;  Surgeon: Olean Ree, MD;  Location: [facility] ORS;  Service: General;  Laterality: N/A;  resection of chest wall cyst / abscess   FRACTURE SURGERY Right    HAND    TONSILLECTOMY     TUBAL LIGATION  2011    Family History  Problem Relation Age of Onset   Diabetes Father    Hypertension Father    Cancer Mother        cervical     Social History   Socioeconomic History   Marital status: Single    Spouse name: Not on file   Number of children: Not on file   Years of education: Not on file   Highest education level: Not on file  Occupational History   Not on file  Tobacco Use   Smoking status: Every Day    Packs/day: 0.50    Years: 15.00    Pack years: 7.50    Types: Cigarettes   Smokeless tobacco: Never   Tobacco comments:    trying to slow  Vaping Use   Vaping Use: Never used  Substance and Sexual Activity   Alcohol use: Yes    Alcohol/week: 1.0 standard drink    Types: 1 Standard drinks or equivalent per week    Comment: socially   Drug use: No   Sexual activity: Yes    Partners: Male    Birth control/protection: Surgical    Comment: declined condoms 12/2020  Other Topics Concern   Not on file  Social History Narrative   Not on file   Social Determinants of Health   Financial Resource Strain: Not on file  Food Insecurity:  Not on file  Transportation Needs: Not on file  Physical Activity: Not on file  Stress: Not on file  Social Connections: Not on file  Intimate Partner Violence: Not on file    Outpatient Medications Prior to Visit  Medication Sig Dispense Refill   Accu-Chek FastClix Lancets MISC 1 Units by Does not apply route 4 (four) times daily as needed. 102 each 12   albuterol (VENTOLIN HFA) 108 (90 Base) MCG/ACT inhaler Inhale 2 puffs into the lungs every 4 (four) hours as needed for wheezing or shortness of breath. 8 g 0   ALPRAZolam (XANAX) 1 MG tablet Take 1 mg by mouth 2 (two) times daily as needed for anxiety (panic attack).     atorvastatin (LIPITOR) 10 MG tablet TAKE ONE TABLET BY MOUTH DAILY 90 tablet 0   BIKTARVY 50-200-25 MG TABS tablet TAKE ONE TABLET BY MOUTH DAILY 30 tablet 1   blood glucose  meter kit and supplies KIT Dispense based on patient and insurance preference. Use up to four times daily as directed. (FOR ICD-9 250.00, 250.01). 1 each 0   Blood Glucose Monitoring Suppl (ACCU-CHEK GUIDE ME) w/Device KIT 1 Units by Does not apply route 4 (four) times daily as needed. 1 kit 0   Blood Pressure Monitoring (BLOOD PRESSURE CUFF) MISC 1 Units by Does not apply route daily. Please dispense on EXTRA LARGE (up to 52cm) blood pressure cuff kit 1 each 0   buPROPion (WELLBUTRIN XL) 300 MG 24 hr tablet Take 300 mg by mouth daily.     busPIRone (BUSPAR) 10 MG tablet Take 10 mg by mouth 3 (three) times daily.     carvedilol (COREG) 25 MG tablet Take 1 tablet (25 mg total) by mouth 2 (two) times daily. 180 tablet 3   furosemide (LASIX) 40 MG tablet TAKE HALF TABLET BY MOUTH DAILY AS NEEDED 30 tablet 1   GLOBAL EASE INJECT PEN NEEDLES 32G X 4 MM MISC USE AS DIRECTED TO ADMINISTER INSULIN THREE TIMES DAILY BEFORE MEALS 100 each 9   glucose blood (ACCU-CHEK GUIDE) test strip Use 3 times a day. Dx code: E11.9 100 each 12   insulin aspart (NOVOLOG) 100 UNIT/ML FlexPen Per sliding scale. Max daily dose 40 units. (Patient taking differently: Inject 2-10 Units into the skin See admin instructions. Injects 2-10 units into the skin per sliding scale. Max daily dose 40 units. 150-199 mg/dl : 2 units; 200-249 mg/dL: 4 units; 250 - 299 mg/dL: 6 units; 300 - 349 mg/dL: 8 units; 350 -399 mg/ dL: 10 units) 15 mL 11   Insulin Glargine (LANTUS SOLOSTAR) 100 UNIT/ML Solostar Pen INJECT 30 UNITS INTO THE SKIN DAILY AT 10 IN THE EVENING (Patient taking differently: Inject 30 Units into the skin daily.) 15 mL 11   JARDIANCE 10 MG TABS tablet TAKE ONE TABLET BY MOUTH DAILY 30 tablet 0   ondansetron (ZOFRAN ODT) 4 MG disintegrating tablet Take 1 tablet (4 mg total) by mouth every 8 (eight) hours as needed for nausea or vomiting. 20 tablet 0   sacubitril-valsartan (ENTRESTO) 97-103 MG Take 1 tablet by mouth 2 (two) times  daily. 180 tablet 3   sertraline (ZOLOFT) 100 MG tablet Take by mouth daily.     spironolactone (ALDACTONE) 25 MG tablet TAKE HALF TABLET BY MOUTH AT BEDTIME 15 tablet 0   traZODone (DESYREL) 50 MG tablet Take 50-100 mg by mouth at bedtime as needed for sleep.   1   No facility-administered medications prior to visit.  Allergies  Allergen Reactions   Percocet [Oxycodone-Acetaminophen] Hives, Itching and Rash   Latex Rash    ROS Review of Systems  Constitutional:  Negative for fatigue and fever.  Respiratory: Negative.    Cardiovascular: Negative.   Gastrointestinal: Negative.  Negative for abdominal pain.  Genitourinary:  Negative for dysuria, flank pain, vaginal discharge and vaginal pain.  Musculoskeletal: Negative.   Psychiatric/Behavioral: Negative.       Objective:    Physical Exam Exam conducted with a chaperone present.  Cardiovascular:     Pulses: Normal pulses.  Abdominal:     General: Bowel sounds are normal.     Palpations: Abdomen is soft.  Genitourinary:    General: Normal vulva.     Exam position: Prone.     Pubic Area: No rash.      Labia:        Right: No tenderness or lesion.        Left: No tenderness or lesion.      Vagina: No foreign body. Vaginal discharge and bleeding present. No erythema.     Cervix: Normal.     Comments: Patient currently on menstrual cycle Neurological:     General: No focal deficit present.     Mental Status: Mental status is at baseline.    BP 136/76   Pulse 75   Temp (!) 97.4 F (36.3 C)   Ht '5\' 2"'  (1.575 m)   Wt 271 lb 12.8 oz (123.3 kg)   SpO2 100%   BMI 49.71 kg/m  Wt Readings from Last 3 Encounters:  07/18/21 280 lb 9.6 oz (127.3 kg)  07/01/21 271 lb 12.8 oz (123.3 kg)  05/13/21 275 lb 0.5 oz (124.8 kg)     Health Maintenance Due  Topic Date Due   OPHTHALMOLOGY EXAM  04/06/2017    There are no preventive care reminders to display for this patient.  Lab Results  Component Value Date   TSH 1.42  07/08/2016   Lab Results  Component Value Date   WBC 6.7 01/27/2021   HGB 13.1 01/27/2021   HCT 39.5 01/27/2021   MCV 95 01/27/2021   PLT 231 01/27/2021   Lab Results  Component Value Date   NA 137 05/13/2021   K 4.3 05/13/2021   CO2 19 (L) 05/13/2021   GLUCOSE 158 (H) 05/13/2021   BUN 17 05/13/2021   CREATININE 0.90 07/18/2021   BILITOT 0.2 01/27/2021   ALKPHOS 67 01/27/2021   AST 7 01/27/2021   ALT 10 01/27/2021   PROT 7.9 01/27/2021   ALBUMIN 3.7 (L) 01/27/2021   CALCIUM 8.7 05/13/2021   ANIONGAP 8 12/18/2020   EGFR 83 05/13/2021   Lab Results  Component Value Date   CHOL 235 (H) 12/26/2018   Lab Results  Component Value Date   HDL 51 12/26/2018   Lab Results  Component Value Date   LDLCALC 147 (H) 12/26/2018   Lab Results  Component Value Date   TRIG 230 (H) 12/26/2018   Lab Results  Component Value Date   CHOLHDL 4.6 12/26/2018   Lab Results  Component Value Date   HGBA1C 8.1 (A) 07/01/2021   HGBA1C 8.1 07/01/2021   HGBA1C 8.1 (A) 07/01/2021   HGBA1C 8.1 (A) 07/01/2021      Assessment & Plan:   Problem List Items Addressed This Visit   None Visit Diagnoses     Type 2 diabetes mellitus with complication, with long-term current use of insulin ([place])    -  Primary  Relevant Orders   POCT URINALYSIS DIP (CLINITEK) (Completed)   HgB A1c (Completed)   Glucose (CBG) (Completed)      Retained tampon not found on examination The patient presented with complaints of a retained tampon.  No tampon found on examination.  Patient was examined by 2 providers.  Patient on menstrual cycle, light bleeding.  Minimal white discharge.  No visible signs of infection.  The patient was given clear instructions to go to ER or return to medical center if symptoms such as fever, chills, foul odor,  or other new problems develop. The patient verbalized understanding.     Type 2 diabetes mellitus with complication, with long-term current use of insulin  (HCC) Patient will follow-up in 3 months is scheduled for type 2 diabetes mellitus. - POCT URINALYSIS DIP (CLINITEK) - HgB A1c - Glucose (CBG)   Follow-up: As needed    Donia Pounds  APRN, MSN, FNP-C Patient [place] [address] [place], [place] [address] [phone]

## 2021-07-18 NOTE — Patient Instructions (Signed)
You were seen today in the [facility] for constipation and abdominal pain. Labs were collected, results will be available via MyChart or, if abnormal, you will be contacted by clinic staff. You were prescribed medications, please take as directed. Please follow up as needed.

## 2021-07-18 NOTE — Progress Notes (Signed)
[place], [place]  [address] Phone:  [phone]   Fax:  [phone] Subjective:   Patient ID: Sherry Hall, female    DOB: 1981/06/11, 40 y.o.   MRN: [id number]  Chief Complaint  Patient presents with   Rectal Pain    Pt is here today for rectal pain and constipation associated with fatigue Pt states that she has been having pinkish, reddish mucus drainage coming from her rectum. Pt states this has been going on x 5 days. Pt states she has not had an appetite this morning due to feeling nauseous.   HPI Sherry Hall 40 y.o. female with history of anemia, anxiety, depression, DM, GERD, HIV and hypertension to the [facility] for anal discharge, constipation, abdominal pain and fatigue . Patient states that she has had constipation x 5 days. Has not taken any medication for constipation. Endorses rectal drainage and pressure. Drainage is pink, reddish mucus. Denies having symptoms in the past. Lower abdominal pain x 3 days, described as cramping. Endorses nausea without vomiting this morning and decreased appetite beginning yesterday. Abdominal pain does not increase with food intake. Denies any worsening or improving factors. Denies any recent trauma or injury. Endorses anal sex, but last occurrence several weeks ago. Denies any fever. Denies any chest pain, shortness of breath, HA or dizziness. Denies any blurred vision, numbness or tingling.  Endorses increased fatigue that began yesterday, she slept the entire day. States that she is concerned due to no history of fatigue, she is generally very active during the day. Currently compliant with all medications. Fasting blood glucose this morning 128. Denies any other complaints today.   Past Medical History:  Diagnosis Date   Anemia    H/O   Anxiety    Depression    Diabetes mellitus type 1 ([place])    GERD (gastroesophageal reflux disease)    HIV (human immunodeficiency virus  infection) ([place]) 2011   Hypertension    Type 2 diabetes mellitus [facility])     Past Surgical History:  Procedure Laterality Date   CESAREAN SECTION  2001, 2008, 2009, 2011   CHOLECYSTECTOMY     EXCISION OF BREAST LESION N/A 01/04/2018   Procedure: EXCISION OF CHEST WALL ABSCESS. MIDDLE CHEST;  Surgeon: Olean Ree, MD;  Location: [facility] ORS;  Service: General;  Laterality: N/A;  resection of chest wall cyst / abscess   FRACTURE SURGERY Right    HAND   TONSILLECTOMY     TUBAL LIGATION  2011    Family History  Problem Relation Age of Onset   Diabetes Father    Hypertension Father    Cancer Mother        cervical     Social History   Socioeconomic History   Marital status: Single    Spouse name: Not on file   Number of children: Not on file   Years of education: Not on file   Highest education level: Not on file  Occupational History   Not on file  Tobacco Use   Smoking status: Every Day    Packs/day: 0.50    Years: 15.00    Pack years: 7.50    Types: Cigarettes   Smokeless tobacco: Never   Tobacco comments:    trying to slow  Vaping Use   Vaping Use: Never used  Substance and Sexual Activity   Alcohol use: Yes    Alcohol/week: 1.0 standard drink    Types: 1 Standard drinks or equivalent  per week    Comment: socially   Drug use: No   Sexual activity: Yes    Partners: Male    Birth control/protection: Surgical    Comment: declined condoms 12/2020  Other Topics Concern   Not on file  Social History Narrative   Not on file   Social Determinants of Health   Financial Resource Strain: Not on file  Food Insecurity: Not on file  Transportation Needs: Not on file  Physical Activity: Not on file  Stress: Not on file  Social Connections: Not on file  Intimate Partner Violence: Not on file    Outpatient Medications Prior to Visit  Medication Sig Dispense Refill   Accu-Chek FastClix Lancets MISC 1 Units by Does not apply route 4 (four) times daily as needed. 102  each 12   albuterol (VENTOLIN HFA) 108 (90 Base) MCG/ACT inhaler Inhale 2 puffs into the lungs every 4 (four) hours as needed for wheezing or shortness of breath. 8 g 0   ALPRAZolam (XANAX) 1 MG tablet Take 1 mg by mouth 2 (two) times daily as needed for anxiety (panic attack).     atorvastatin (LIPITOR) 10 MG tablet TAKE ONE TABLET BY MOUTH DAILY 90 tablet 0   BIKTARVY 50-200-25 MG TABS tablet TAKE ONE TABLET BY MOUTH DAILY 30 tablet 1   blood glucose meter kit and supplies KIT Dispense based on patient and insurance preference. Use up to four times daily as directed. (FOR ICD-9 250.00, 250.01). 1 each 0   Blood Glucose Monitoring Suppl (ACCU-CHEK GUIDE ME) w/Device KIT 1 Units by Does not apply route 4 (four) times daily as needed. 1 kit 0   Blood Pressure Monitoring (BLOOD PRESSURE CUFF) MISC 1 Units by Does not apply route daily. Please dispense on EXTRA LARGE (up to 52cm) blood pressure cuff kit 1 each 0   buPROPion (WELLBUTRIN XL) 300 MG 24 hr tablet Take 300 mg by mouth daily.     busPIRone (BUSPAR) 10 MG tablet Take 10 mg by mouth 3 (three) times daily.     carvedilol (COREG) 25 MG tablet Take 1 tablet (25 mg total) by mouth 2 (two) times daily. 180 tablet 3   furosemide (LASIX) 40 MG tablet TAKE HALF TABLET BY MOUTH DAILY AS NEEDED 30 tablet 1   GLOBAL EASE INJECT PEN NEEDLES 32G X 4 MM MISC USE AS DIRECTED TO ADMINISTER INSULIN THREE TIMES DAILY BEFORE MEALS 100 each 9   glucose blood (ACCU-CHEK GUIDE) test strip Use 3 times a day. Dx code: E11.9 100 each 12   insulin aspart (NOVOLOG) 100 UNIT/ML FlexPen Per sliding scale. Max daily dose 40 units. (Patient taking differently: Inject 2-10 Units into the skin See admin instructions. Injects 2-10 units into the skin per sliding scale. Max daily dose 40 units. 150-199 mg/dl : 2 units; 200-249 mg/dL: 4 units; 250 - 299 mg/dL: 6 units; 300 - 349 mg/dL: 8 units; 350 -399 mg/ dL: 10 units) 15 mL 11   Insulin Glargine (LANTUS SOLOSTAR) 100 UNIT/ML  Solostar Pen INJECT 30 UNITS INTO THE SKIN DAILY AT 10 IN THE EVENING (Patient taking differently: Inject 30 Units into the skin daily.) 15 mL 11   JARDIANCE 10 MG TABS tablet TAKE ONE TABLET BY MOUTH DAILY 30 tablet 0   ondansetron (ZOFRAN ODT) 4 MG disintegrating tablet Take 1 tablet (4 mg total) by mouth every 8 (eight) hours as needed for nausea or vomiting. 20 tablet 0   sacubitril-valsartan (ENTRESTO) 97-103 MG Take 1 tablet  by mouth 2 (two) times daily. 180 tablet 3   sertraline (ZOLOFT) 100 MG tablet Take by mouth daily.     spironolactone (ALDACTONE) 25 MG tablet TAKE HALF TABLET BY MOUTH AT BEDTIME 15 tablet 0   traZODone (DESYREL) 50 MG tablet Take 50-100 mg by mouth at bedtime as needed for sleep.   1   No facility-administered medications prior to visit.    Allergies  Allergen Reactions   Percocet [Oxycodone-Acetaminophen] Hives, Itching and Rash   Latex Rash    Review of Systems  Constitutional:  Positive for malaise/fatigue. Negative for chills and fever.  HENT: Negative.    Eyes: Negative.   Respiratory:  Negative for cough and shortness of breath.   Cardiovascular:  Negative for chest pain, palpitations and leg swelling.  Gastrointestinal:  Positive for abdominal pain, constipation and nausea. Negative for blood in stool, diarrhea and vomiting.       See HPI  Genitourinary: Negative.   Musculoskeletal: Negative.   Skin: Negative.   Neurological: Negative.   Psychiatric/Behavioral:  Negative for depression. The patient is not nervous/anxious.   All other systems reviewed and are negative.     Objective:    Physical Exam Vitals reviewed.  Constitutional:      General: She is not in acute distress.    Appearance: Normal appearance.  HENT:     Head: Normocephalic.  Cardiovascular:     Rate and Rhythm: Normal rate and regular rhythm.     Pulses: Normal pulses.     Heart sounds: Normal heart sounds.     Comments: No obvious peripheral edema Pulmonary:      Effort: Pulmonary effort is normal.     Breath sounds: Normal breath sounds.  Abdominal:     General: Abdomen is flat. Bowel sounds are normal. There is no distension.     Palpations: Abdomen is soft. There is no mass.     Tenderness: There is no abdominal tenderness. There is no right CVA tenderness, left CVA tenderness, guarding or rebound.     Hernia: No hernia is present.     Comments: On rectal exam, no noted drainage, lesions or masses. Fecal matter light brown. No stool impaction noted  Musculoskeletal:     Cervical back: Normal range of motion and neck supple.  Skin:    General: Skin is warm and dry.     Capillary Refill: Capillary refill takes less than 2 seconds.  Neurological:     General: No focal deficit present.     Mental Status: She is alert and oriented to person, place, and time.  Psychiatric:        Mood and Affect: Mood normal.        Behavior: Behavior normal.        Thought Content: Thought content normal.        Judgment: Judgment normal.    BP (!) 158/81   Pulse 90   Temp (!) 96.4 F (35.8 C)   Ht 5' (1.524 m)   Wt 280 lb 9.6 oz (127.3 kg)   SpO2 100%   BMI 54.80 kg/m  Wt Readings from Last 3 Encounters:  07/18/21 280 lb 9.6 oz (127.3 kg)  07/01/21 271 lb 12.8 oz (123.3 kg)  05/13/21 275 lb 0.5 oz (124.8 kg)    Immunization History  Administered Date(s) Administered   Hep A / Hep B 01/28/2010, 03/11/2010   Hepatitis A, Adult 01/28/2010, 03/11/2010   Hepatitis B 01/28/2010, 03/11/2010   Influenza,inj,Quad PF,6+ Mos  09/16/2015, 08/10/2019   Influenza-Unspecified 11/16/2011, 10/20/2012   PPD Test 01/28/2010, 11/16/2011, 01/11/2013   Pneumococcal Conjugate-13 10/20/2012, 01/03/2016   Pneumococcal Polysaccharide-23 02/04/2010, 10/20/2012   Tdap 01/11/2013    Diabetic Foot Exam - Simple   No data filed     Lab Results  Component Value Date   TSH 1.42 07/08/2016   Lab Results  Component Value Date   WBC 6.7 01/27/2021   HGB 13.1  01/27/2021   HCT 39.5 01/27/2021   MCV 95 01/27/2021   PLT 231 01/27/2021   Lab Results  Component Value Date   NA 137 05/13/2021   K 4.3 05/13/2021   CO2 19 (L) 05/13/2021   GLUCOSE 158 (H) 05/13/2021   BUN 17 05/13/2021   CREATININE 0.89 05/13/2021   BILITOT 0.2 01/27/2021   ALKPHOS 67 01/27/2021   AST 7 01/27/2021   ALT 10 01/27/2021   PROT 7.9 01/27/2021   ALBUMIN 3.7 (L) 01/27/2021   CALCIUM 8.7 05/13/2021   ANIONGAP 8 12/18/2020   EGFR 83 05/13/2021   Lab Results  Component Value Date   CHOL 235 (H) 12/26/2018   CHOL 167 11/23/2016   Lab Results  Component Value Date   HDL 51 12/26/2018   HDL 47 (L) 11/23/2016   Lab Results  Component Value Date   LDLCALC 147 (H) 12/26/2018   LDLCALC 97 11/23/2016   Lab Results  Component Value Date   TRIG 230 (H) 12/26/2018   TRIG 116 11/23/2016   Lab Results  Component Value Date   CHOLHDL 4.6 12/26/2018   CHOLHDL 3.6 11/23/2016   Lab Results  Component Value Date   HGBA1C 8.1 (A) 07/01/2021   HGBA1C 8.1 07/01/2021   HGBA1C 8.1 (A) 07/01/2021   HGBA1C 8.1 (A) 07/01/2021       Assessment & Plan:  With regard to anal discharge concerned for less likely internal hemorrhoids v STD v other infectious process   With regard to abdominal pain, concerned for diverticulitis versus colitis versus constipation versus cholelithiasis versus gastritis versus other infectious process versus other intra-abdominal etiology  Patient verbalized fatigue, concerned for electrolyte abnormality versus acute on chronic anemia versus infectious process  The following orders were complete. Will review studies and treat accordingly. Problem List Items Addressed This Visit   None Visit Diagnoses     Anal discharge    -  Primary   Relevant Orders   CBC with Differential/Platelet   Comprehensive metabolic panel   POCT URINALYSIS DIP (CLINITEK) (Completed)   CT ABDOMEN PELVIS W CONTRAST   GC/Chlamydia Probe Amp([facility])   Other  constipation       Relevant Medications   polyethylene glycol powder (GLYCOLAX/MIRALAX) 17 GM/SCOOP powder Discussed diet options to assist with management of constipation   Other Relevant Orders   CT ABDOMEN PELVIS W CONTRAST   Lower abdominal pain       Relevant Orders   CBC with Differential/Platelet   Comprehensive metabolic panel   POCT URINALYSIS DIP (CLINITEK) (Completed)   Lipase   CT ABDOMEN PELVIS W CONTRAST Prescription deferred until CT completed, patient informed to take OTC medications as needed for pain   Other fatigue       Relevant Orders   CBC with Differential/Platelet   Comprehensive metabolic panel   POCT URINALYSIS DIP (CLINITEK) (Completed)   CT ABDOMEN PELVIS W CONTRAST   Nausea     Patient states that she has prescription for zofran at home   Maintain existing follow up, 1-2 wks or  sooner as needed if symptoms worsen or remain unresolved    I am having Asami Gurganus start on polyethylene glycol powder. I am also having her maintain her traZODone, ALPRAZolam, busPIRone, sertraline, blood glucose meter kit and supplies, Global Ease Inject Pen Needles, Lantus SoloStar, insulin aspart, Accu-Chek Guide Me, Accu-Chek FastClix Lancets, Accu-Chek Guide, albuterol, buPROPion, sacubitril-valsartan, Blood Pressure Cuff, carvedilol, furosemide, ondansetron, spironolactone, Jardiance, atorvastatin, and Biktarvy.  Meds ordered this encounter  Medications   polyethylene glycol powder (GLYCOLAX/MIRALAX) 17 GM/SCOOP powder    Sig: Take 17 g by mouth 2 (two) times daily as needed for mild constipation or moderate constipation.    Dispense:  3350 g    Refill:  1     Teena Dunk, NP

## 2021-07-19 LAB — COMPREHENSIVE METABOLIC PANEL
ALT: 14 IU/L (ref 0–32)
AST: 11 IU/L (ref 0–40)
Albumin/Globulin Ratio: 1 — ABNORMAL LOW (ref 1.2–2.2)
Albumin: 3.6 g/dL — ABNORMAL LOW (ref 3.8–4.8)
Alkaline Phosphatase: 82 IU/L (ref 44–121)
BUN/Creatinine Ratio: 21 (ref 9–23)
BUN: 19 mg/dL (ref 6–24)
Bilirubin Total: 0.2 mg/dL (ref 0.0–1.2)
CO2: 17 mmol/L — ABNORMAL LOW (ref 20–29)
Calcium: 8.6 mg/dL — ABNORMAL LOW (ref 8.7–10.2)
Chloride: 103 mmol/L (ref 96–106)
Creatinine, Ser: 0.89 mg/dL (ref 0.57–1.00)
Globulin, Total: 3.7 g/dL (ref 1.5–4.5)
Glucose: 239 mg/dL — ABNORMAL HIGH (ref 70–99)
Potassium: 4.5 mmol/L (ref 3.5–5.2)
Sodium: 135 mmol/L (ref 134–144)
Total Protein: 7.3 g/dL (ref 6.0–8.5)
eGFR: 83 mL/min/{1.73_m2} (ref >59)

## 2021-07-19 LAB — CBC WITH DIFFERENTIAL/PLATELET
Basophils Absolute: 0.1 10*3/uL (ref 0.0–0.2)
Basos: 1 %
EOS (ABSOLUTE): 0.2 10*3/uL (ref 0.0–0.4)
Eos: 4 %
Hematocrit: 36.3 % (ref 34.0–46.6)
Hemoglobin: 12.4 g/dL (ref 11.1–15.9)
Immature Grans (Abs): 0 10*3/uL (ref 0.0–0.1)
Immature Granulocytes: 0 %
Lymphocytes Absolute: 1.8 10*3/uL (ref 0.7–3.1)
Lymphs: 30 %
MCH: 32.5 pg (ref 26.6–33.0)
MCHC: 34.2 g/dL (ref 31.5–35.7)
MCV: 95 fL (ref 79–97)
Monocytes Absolute: 0.5 10*3/uL (ref 0.1–0.9)
Monocytes: 9 %
Neutrophils Absolute: 3.2 10*3/uL (ref 1.4–7.0)
Neutrophils: 56 %
Platelets: 200 10*3/uL (ref 150–450)
RBC: 3.81 x10E6/uL (ref 3.77–5.28)
RDW: 12.4 % (ref 11.7–15.4)
WBC: 5.8 10*3/uL (ref 3.4–10.8)

## 2021-07-19 LAB — LIPASE: Lipase: 40 U/L (ref 14–72)

## 2021-07-21 ENCOUNTER — Other Ambulatory Visit: Payer: Self-pay | Admitting: Cardiovascular Disease

## 2021-07-22 ENCOUNTER — Ambulatory Visit (INDEPENDENT_AMBULATORY_CARE_PROVIDER_SITE_OTHER): Payer: Medicare Other | Admitting: Nurse Practitioner

## 2021-07-22 ENCOUNTER — Encounter: Payer: Self-pay | Admitting: Nurse Practitioner

## 2021-07-22 ENCOUNTER — Other Ambulatory Visit: Payer: Self-pay

## 2021-07-22 VITALS — BP 152/85 | HR 83 | Temp 97.2°F | Ht 60.0 in | Wt 273.1 lb

## 2021-07-22 DIAGNOSIS — T887XXA Unspecified adverse effect of drug or medicament, initial encounter: Secondary | ICD-10-CM

## 2021-07-22 DIAGNOSIS — K529 Noninfective gastroenteritis and colitis, unspecified: Secondary | ICD-10-CM | POA: Diagnosis not present

## 2021-07-22 MED ORDER — CEFTRIAXONE SODIUM 1 G IJ SOLR
1.0000 g | Freq: Once | INTRAMUSCULAR | Status: AC
  Administered 2021-07-22: 1 g via INTRAMUSCULAR

## 2021-07-22 MED ORDER — FLUCONAZOLE 150 MG PO TABS: 150.0000 mg | ORAL_TABLET | Freq: Once | ORAL | 0 refills | Status: AC

## 2021-07-22 NOTE — Patient Instructions (Signed)
You were seen today in the [facility] for further evaluation of symptoms. Labs were collected, results will be available via MyChart or, if abnormal, you will be contacted by clinic staff. You were prescribed medications, please take as directed. Please follow up in 2 mths for reevaluation.

## 2021-07-22 NOTE — Progress Notes (Signed)
[address], [place]  [address] Phone:  [phone]   Fax:  [phone] Subjective:   Patient ID: Sherry Hall, female    DOB: August 28, 1981, 40 y.o.   MRN: [id number]  Chief Complaint  Patient presents with   Follow-up    CT results, additional blood work   HPI Sherry Hall 40 y.o. female with history of anemia, anxiety, depression, DM, HIV, GERD and hypertension to the [facility] for reevaluation of symptoms discussed at previous visit and to discuss CT results. Patient states that she began taking Miralax yesterday, with successful bowel movement and improvement in abdominal pain. Began taking antibiotics yesterday. Requested prescription for diflucan for vaginal yeast infection that typically occurs with antibiotics. Denies any sexual intercourse since last visit. Denies any vaginal discharge or discomfort.   Denies any fever. Denies any fatigue, chest pain, shortness of breath, HA or dizziness. Denies any blurred vision, numbness or tingling.   Past Medical History:  Diagnosis Date   Anemia    H/O   Anxiety    Depression    Diabetes mellitus type 1 ([place])    GERD (gastroesophageal reflux disease)    HIV (human immunodeficiency virus infection) ([place]) 2011   Hypertension    Type 2 diabetes mellitus [facility])     Past Surgical History:  Procedure Laterality Date   CESAREAN SECTION  2001, 2008, 2009, 2011   CHOLECYSTECTOMY     EXCISION OF BREAST LESION N/A 01/04/2018   Procedure: EXCISION OF CHEST WALL ABSCESS. MIDDLE CHEST;  Surgeon: Olean Ree, MD;  Location: [facility] ORS;  Service: General;  Laterality: N/A;  resection of chest wall cyst / abscess   FRACTURE SURGERY Right    HAND   TONSILLECTOMY     TUBAL LIGATION  2011    Family History  Problem Relation Age of Onset   Diabetes Father    Hypertension Father    Cancer Mother        cervical     Social History   Socioeconomic History   Marital status: Single     Spouse name: Not on file   Number of children: Not on file   Years of education: Not on file   Highest education level: Not on file  Occupational History   Not on file  Tobacco Use   Smoking status: Every Day    Packs/day: 0.50    Years: 15.00    Pack years: 7.50    Types: Cigarettes   Smokeless tobacco: Never   Tobacco comments:    trying to slow  Vaping Use   Vaping Use: Never used  Substance and Sexual Activity   Alcohol use: Yes    Alcohol/week: 1.0 standard drink    Types: 1 Standard drinks or equivalent per week    Comment: socially   Drug use: No   Sexual activity: Yes    Partners: Male    Birth control/protection: Surgical    Comment: declined condoms 12/2020  Other Topics Concern   Not on file  Social History Narrative   Not on file   Social Determinants of Health   Financial Resource Strain: Not on file  Food Insecurity: Not on file  Transportation Needs: Not on file  Physical Activity: Not on file  Stress: Not on file  Social Connections: Not on file  Intimate Partner Violence: Not on file    Outpatient Medications Prior to Visit  Medication Sig Dispense Refill   Accu-Chek FastClix Lancets MISC  1 Units by Does not apply route 4 (four) times daily as needed. 102 each 12   albuterol (VENTOLIN HFA) 108 (90 Base) MCG/ACT inhaler Inhale 2 puffs into the lungs every 4 (four) hours as needed for wheezing or shortness of breath. 8 g 0   ALPRAZolam (XANAX) 1 MG tablet Take 1 mg by mouth 2 (two) times daily as needed for anxiety (panic attack).     atorvastatin (LIPITOR) 10 MG tablet TAKE ONE TABLET BY MOUTH DAILY 90 tablet 0   BIKTARVY 50-200-25 MG TABS tablet TAKE ONE TABLET BY MOUTH DAILY 30 tablet 1   blood glucose meter kit and supplies KIT Dispense based on patient and insurance preference. Use up to four times daily as directed. (FOR ICD-9 250.00, 250.01). 1 each 0   Blood Glucose Monitoring Suppl (ACCU-CHEK GUIDE ME) w/Device KIT 1 Units by Does not apply  route 4 (four) times daily as needed. 1 kit 0   Blood Pressure Monitoring (BLOOD PRESSURE CUFF) MISC 1 Units by Does not apply route daily. Please dispense on EXTRA LARGE (up to 52cm) blood pressure cuff kit 1 each 0   buPROPion (WELLBUTRIN XL) 300 MG 24 hr tablet Take 300 mg by mouth daily.     busPIRone (BUSPAR) 10 MG tablet Take 10 mg by mouth 3 (three) times daily.     carvedilol (COREG) 25 MG tablet Take 1 tablet (25 mg total) by mouth 2 (two) times daily. 180 tablet 3   doxycycline (ADOXA) 100 MG tablet Take 1 tablet (100 mg total) by mouth 2 (two) times daily for 7 days. 14 tablet 0   furosemide (LASIX) 40 MG tablet TAKE HALF TABLET BY MOUTH DAILY AS NEEDED 30 tablet 0   GLOBAL EASE INJECT PEN NEEDLES 32G X 4 MM MISC USE AS DIRECTED TO ADMINISTER INSULIN THREE TIMES DAILY BEFORE MEALS 100 each 9   glucose blood (ACCU-CHEK GUIDE) test strip Use 3 times a day. Dx code: E11.9 100 each 12   insulin aspart (NOVOLOG) 100 UNIT/ML FlexPen Per sliding scale. Max daily dose 40 units. (Patient taking differently: Inject 2-10 Units into the skin See admin instructions. Injects 2-10 units into the skin per sliding scale. Max daily dose 40 units. 150-199 mg/dl : 2 units; 200-249 mg/dL: 4 units; 250 - 299 mg/dL: 6 units; 300 - 349 mg/dL: 8 units; 350 -399 mg/ dL: 10 units) 15 mL 11   Insulin Glargine (LANTUS SOLOSTAR) 100 UNIT/ML Solostar Pen INJECT 30 UNITS INTO THE SKIN DAILY AT 10 IN THE EVENING (Patient taking differently: Inject 30 Units into the skin daily.) 15 mL 11   JARDIANCE 10 MG TABS tablet TAKE ONE TABLET BY MOUTH DAILY 30 tablet 0   ondansetron (ZOFRAN ODT) 4 MG disintegrating tablet Take 1 tablet (4 mg total) by mouth every 8 (eight) hours as needed for nausea or vomiting. 20 tablet 0   polyethylene glycol powder (GLYCOLAX/MIRALAX) 17 GM/SCOOP powder Take 17 g by mouth 2 (two) times daily as needed for mild constipation or moderate constipation. 3350 g 1   sacubitril-valsartan (ENTRESTO) 97-103  MG Take 1 tablet by mouth 2 (two) times daily. 180 tablet 3   sertraline (ZOLOFT) 100 MG tablet Take by mouth daily.     spironolactone (ALDACTONE) 25 MG tablet TAKE HALF TABLET BY MOUTH AT BEDTIME 15 tablet 0   traZODone (DESYREL) 50 MG tablet Take 50-100 mg by mouth at bedtime as needed for sleep.   1   No facility-administered medications prior to visit.      Allergies  Allergen Reactions   Percocet [Oxycodone-Acetaminophen] Hives, Itching and Rash   Latex Rash    Review of Systems  Constitutional:  Negative for chills, fever and malaise/fatigue.  HENT: Negative.    Eyes: Negative.   Respiratory:  Negative for cough and shortness of breath.   Cardiovascular:  Negative for chest pain, palpitations and leg swelling.  Gastrointestinal:  Negative for abdominal pain, blood in stool, constipation, diarrhea, nausea and vomiting.  Skin: Negative.   Neurological: Negative.   Psychiatric/Behavioral:  Negative for depression. The patient is not nervous/anxious.   All other systems reviewed and are negative.     Objective:    Physical Exam Vitals reviewed.  Constitutional:      General: She is not in acute distress.    Appearance: Normal appearance.  HENT:     Head: Normocephalic.  Cardiovascular:     Rate and Rhythm: Normal rate and regular rhythm.     Pulses: Normal pulses.     Heart sounds: Normal heart sounds.     Comments: No obvious peripheral edema Pulmonary:     Effort: Pulmonary effort is normal.     Breath sounds: Normal breath sounds.  Musculoskeletal:     Cervical back: Normal range of motion and neck supple.  Skin:    General: Skin is warm and dry.     Capillary Refill: Capillary refill takes less than 2 seconds.  Neurological:     General: No focal deficit present.     Mental Status: She is alert and oriented to person, place, and time.  Psychiatric:        Mood and Affect: Mood normal.        Behavior: Behavior normal.        Thought Content: Thought content  normal.        Judgment: Judgment normal.    BP (!) 152/85   Pulse 83   Temp (!) 97.2 F (36.2 C)   Ht 5' (1.524 m)   Wt 273 lb 0.8 oz (123.9 kg)   SpO2 100%   BMI 53.33 kg/m  Wt Readings from Last 3 Encounters:  07/22/21 273 lb 0.8 oz (123.9 kg)  07/18/21 280 lb 9.6 oz (127.3 kg)  07/01/21 271 lb 12.8 oz (123.3 kg)    Immunization History  Administered Date(s) Administered   Hep A / Hep B 01/28/2010, 03/11/2010   Hepatitis A, Adult 01/28/2010, 03/11/2010   Hepatitis B 01/28/2010, 03/11/2010   Influenza,inj,Quad PF,6+ Mos 09/16/2015, 08/10/2019   Influenza-Unspecified 11/16/2011, 10/20/2012   PPD Test 01/28/2010, 11/16/2011, 01/11/2013   Pneumococcal Conjugate-13 10/20/2012, 01/03/2016   Pneumococcal Polysaccharide-23 02/04/2010, 10/20/2012   Tdap 01/11/2013    Diabetic Foot Exam - Simple   No data filed     Lab Results  Component Value Date   TSH 1.42 07/08/2016   Lab Results  Component Value Date   WBC 5.8 07/18/2021   HGB 12.4 07/18/2021   HCT 36.3 07/18/2021   MCV 95 07/18/2021   PLT 200 07/18/2021   Lab Results  Component Value Date   NA 135 07/18/2021   K 4.5 07/18/2021   CO2 17 (L) 07/18/2021   GLUCOSE 239 (H) 07/18/2021   BUN 19 07/18/2021   CREATININE 0.90 07/18/2021   BILITOT 0.2 07/18/2021   ALKPHOS 82 07/18/2021   AST 11 07/18/2021   ALT 14 07/18/2021   PROT 7.3 07/18/2021   ALBUMIN 3.6 (L) 07/18/2021   CALCIUM 8.6 (L) 07/18/2021   ANIONGAP 8 12/18/2020  EGFR 83 07/18/2021   Lab Results  Component Value Date   CHOL 235 (H) 12/26/2018   CHOL 167 11/23/2016   Lab Results  Component Value Date   HDL 51 12/26/2018   HDL 47 (L) 11/23/2016   Lab Results  Component Value Date   LDLCALC 147 (H) 12/26/2018   LDLCALC 97 11/23/2016   Lab Results  Component Value Date   TRIG 230 (H) 12/26/2018   TRIG 116 11/23/2016   Lab Results  Component Value Date   CHOLHDL 4.6 12/26/2018   CHOLHDL 3.6 11/23/2016   Lab Results  Component  Value Date   HGBA1C 8.1 (A) 07/01/2021   HGBA1C 8.1 07/01/2021   HGBA1C 8.1 (A) 07/01/2021   HGBA1C 8.1 (A) 07/01/2021       Assessment & Plan:   Problem List Items Addressed This Visit   None Visit Diagnoses     Proctocolitis    -  Primary   Relevant Medications   cefTRIAXone (ROCEPHIN) injection 1 g (Completed) (Start on 07/22/2021  4:00 PM)   Other Relevant Orders   STD Panel Discussed CT results, causes and treatment plan, patient demonstrated understanding and agreed with plan  Encouraged to complete antibiotics  Encouraged to continue taking Miralax as needed for constipation Informed to refrain from sexual activity until completion of lab results and medications Informed to notify ID of new diagnosis and current treatment   Medication side effect       Relevant Medications   fluconazole (DIFLUCAN) 150 MG tablet   Maintain previously scheduled follow up in 2 mths, sooner as needed    I am having Sherry Hall start on fluconazole. I am also having her maintain her traZODone, ALPRAZolam, busPIRone, sertraline, blood glucose meter kit and supplies, Global Ease Inject Pen Needles, Lantus SoloStar, insulin aspart, Accu-Chek Guide Me, Accu-Chek FastClix Lancets, Accu-Chek Guide, albuterol, buPROPion, sacubitril-valsartan, Blood Pressure Cuff, carvedilol, ondansetron, spironolactone, Jardiance, atorvastatin, Biktarvy, polyethylene glycol powder, doxycycline, and furosemide. We administered cefTRIAXone.  Meds ordered this encounter  Medications   fluconazole (DIFLUCAN) 150 MG tablet    Sig: Take 1 tablet (150 mg total) by mouth once for 1 dose.    Dispense:  1 tablet    Refill:  0   cefTRIAXone (ROCEPHIN) injection 1 g     Teena Dunk, NP

## 2021-07-23 ENCOUNTER — Other Ambulatory Visit: Payer: Self-pay

## 2021-07-23 ENCOUNTER — Other Ambulatory Visit: Payer: Self-pay | Admitting: Nurse Practitioner

## 2021-07-23 DIAGNOSIS — K529 Noninfective gastroenteritis and colitis, unspecified: Secondary | ICD-10-CM

## 2021-07-23 LAB — GC/CHLAMYDIA PROBE AMP
Chlamydia trachomatis, NAA: NEGATIVE
Neisseria Gonorrhoeae by PCR: NEGATIVE

## 2021-07-25 LAB — RPR: RPR Ser Ql: REACTIVE — AB

## 2021-07-25 LAB — RPR, QUANT+TP ABS (REFLEX)
Rapid Plasma Reagin, Quant: 1:4 {titer} — ABNORMAL HIGH
T Pallidum Abs: REACTIVE — AB

## 2021-07-25 LAB — HSV(HERPES SIMPLEX VRS) I + II AB-IGG
HSV 1 Glycoprotein G Ab, IgG: 14.4 index — ABNORMAL HIGH (ref 0.00–0.90)
HSV 2 IgG, Type Spec: 7.22 index — ABNORMAL HIGH (ref 0.00–0.90)

## 2021-08-01 ENCOUNTER — Other Ambulatory Visit: Payer: Self-pay | Admitting: Infectious Diseases

## 2021-08-01 DIAGNOSIS — B20 Human immunodeficiency virus [HIV] disease: Secondary | ICD-10-CM

## 2021-08-01 NOTE — Telephone Encounter (Signed)
Appt 10/20

## 2021-08-07 ENCOUNTER — Ambulatory Visit: Payer: Medicare Other | Admitting: Infectious Diseases

## 2021-08-07 NOTE — Telephone Encounter (Signed)
Appt 10/24 

## 2021-08-11 ENCOUNTER — Encounter: Payer: Self-pay | Admitting: Infectious Diseases

## 2021-08-11 ENCOUNTER — Other Ambulatory Visit: Payer: Self-pay

## 2021-08-11 ENCOUNTER — Telehealth (INDEPENDENT_AMBULATORY_CARE_PROVIDER_SITE_OTHER): Payer: Medicare Other | Admitting: Infectious Diseases

## 2021-08-11 DIAGNOSIS — K529 Noninfective gastroenteritis and colitis, unspecified: Secondary | ICD-10-CM | POA: Diagnosis not present

## 2021-08-11 DIAGNOSIS — A539 Syphilis, unspecified: Secondary | ICD-10-CM

## 2021-08-11 DIAGNOSIS — Z8619 Personal history of other infectious and parasitic diseases: Secondary | ICD-10-CM | POA: Insufficient documentation

## 2021-08-11 DIAGNOSIS — B20 Human immunodeficiency virus [HIV] disease: Secondary | ICD-10-CM

## 2021-08-11 MED ORDER — PENICILLIN G BENZATHINE 1200000 UNIT/2ML IM SUSY
1.2000 10*6.[IU] | PREFILLED_SYRINGE | Freq: Once | INTRAMUSCULAR | Status: AC
  Administered 2021-08-11: 1.2 10*6.[IU] via INTRAMUSCULAR

## 2021-08-11 NOTE — Assessment & Plan Note (Signed)
Likely due to syphilis (would be secondary stage) but resolved after ceftriaxone + doxy (7d) course. Given uncertainty of timeline, out of caution, will give longer course of treatment.

## 2021-08-11 NOTE — Patient Instructions (Addendum)
Thank you for coming in today.   I would like to get you more treatment for the syphilis infection. We can cure this for you and the best I would recommend is 3 weekly shots of a medication called bicillin. I don't know exactly when you got It so this is best so we know we cure it.   Please stop by the lab on your way out today  Please schedule your Flu shot at your convenience   Please plan to return in 3-4 months to repeat your blood work.

## 2021-08-11 NOTE — Addendum Note (Signed)
Addended by: Tressa Busman T on: 08/11/2021 11:25 AM   Modules accepted: Orders

## 2021-08-11 NOTE — Assessment & Plan Note (Signed)
Doing well on Biktarvy once daily. Will update her VL today for therapeutic monitoring.  Flu / COVID booster to be scheduled at her convenience.  RTC in 32m for ongoing monitoring.

## 2021-08-11 NOTE — Progress Notes (Signed)
Name: Sherry Hall  FWY:[id number]   DOB: 1981/05/01   PCP: Dorena Dew, FNP    VIRTUAL CARE ENCOUNTER  I connected with Kandice Rafanan on 08/11/21 at 10:15 AM EDT by VIDEO and verified that I am speaking with the correct person using two identifiers.   I discussed the limitations, risks, security and privacy concerns of performing an evaluation and management service by telephone and the availability of in person appointments. I also discussed with the patient that there may be a patient responsible charge related to this service. The patient expressed understanding and agreed to proceed.  Patient Location: [facility]  Other Participants: none  Provider Location: Provider Home ([place], [place])     Brief History ID: Sherry Hall is a 40 y.o. female with HIV disease. Dx 2011. Intermittently on medications. Co morbidities: T1DM, HTN, Hyperlipidemia, obesity. HIV Risk: heterosexual Hx: OIs: none known  Hep B sAg (not on file); Hep BsAb (neg); Hep B cAb (not on file); Hep C (not on file); Hep A (not on file); HLA B*5701 (-); Quantiferon (-)  Previous Regimens: Complera Odefsey Biktarvy    Genotypes: 2017 - no significant mutations PR/RT     Chief Complaint  Patient presents with   Follow-up    B20  HIV follow up care  New syphilis infection     HPI:  Sweet is here today to discuss recent STI exposure from her long-term boyfriend.  OV with PCP 9/30 for anal discharge/drainage and associated constipation. Has had a history of anal sex in the past. Recently newly reactive titer to syphlis 1:4. Unclear as to when her boyfriend was unfaithful to her. Rectal swab negative for GC/C. She was treated with a dose of ceftriaxone IM and Doxycycline was prescribed for 7-day course (completed > 2 weeks ago). She did have improvement after the antibiotics and feels that her BMs are normalizing. Pain is gone and overall feels better.  She is sexually  active with her partner and working through counseling.  Has had a harder time taking her evening medications (PM Dose of entresto and carvedilol). Misses a dose a week maybe. She does not mis her Biktarvy though since this is in her morning routine.  COVID booster - not ready to get today but will schedule an appt with her flu shot soon.      Review of Systems  Constitutional:  Negative for appetite change, chills, fatigue, fever and unexpected weight change.  HENT:  Negative for mouth sores, sore throat and trouble swallowing.   Eyes:  Negative for pain and visual disturbance.  Respiratory:  Negative for cough and shortness of breath.   Cardiovascular:  Negative for chest pain.  Gastrointestinal:  Negative for abdominal pain, diarrhea and nausea.  Genitourinary:  Negative for dysuria, menstrual problem and pelvic pain.  Musculoskeletal:  Negative for back pain and neck pain.  Skin:  Negative for color change and rash.  Neurological:  Negative for weakness, numbness and headaches.  Hematological:  Negative for adenopathy.  Psychiatric/Behavioral:  Negative for dysphoric mood. The patient is not nervous/anxious.       Observations/Objective: Today's Vitals   08/11/21 1037  BP: (!) 167/94  Pulse: 81  Temp: 98.1 F (36.7 C)  TempSrc: Oral  Weight: 273 lb (123.8 kg)    Body mass index is 53.32 kg/m.   Physical Exam Vitals reviewed.  Constitutional:      Appearance: Normal appearance. She is not ill-appearing.  HENT:     Mouth/Throat:  Mouth: Mucous membranes are moist.     Pharynx: Oropharynx is clear.  Eyes:     General: No scleral icterus. Pulmonary:     Effort: Pulmonary effort is normal.     Comments: No shortness of breath detected in conversation.  Neurological:     Mental Status: She is oriented to person, place, and time.  Psychiatric:        Behavior: Behavior normal.        Thought Content: Thought content normal.        Judgment: Judgment normal.      Comments: Tearful      HIV 1 RNA Quant  Date Value  01/03/2021 <20 Copies/mL (H)  12/13/2020 3,510 copies/mL  08/10/2019 836 copies/mL (H)   CD4 T Cell Abs (/uL)  Date Value  12/12/2020 570  08/10/2019 535  12/26/2018 390 (L)    Lab Results  Component Value Date   CREATININE 0.90 07/18/2021   CREATININE 0.89 07/18/2021   CREATININE 0.89 05/13/2021    Lab Results  Component Value Date   WBC 5.8 07/18/2021   HGB 12.4 07/18/2021   HCT 36.3 07/18/2021   MCV 95 07/18/2021   PLT 200 07/18/2021    Lab Results  Component Value Date   ALT 14 07/18/2021   AST 11 07/18/2021   ALKPHOS 82 07/18/2021   BILITOT 0.2 07/18/2021     Assessment and Plan:  Problem List Items Addressed This Visit       1.   HIV disease ([place]) (Chronic)    Doing well on Biktarvy once daily. Will update her VL today for therapeutic monitoring.  Flu / COVID booster to be scheduled at her convenience.  RTC in 48mfor ongoing monitoring.       Relevant Orders   HIV 1 RNA quant-no reflex-bld     Unprioritized   Syphilis    Recently diagnosed with syphilis with newly reactive titer 1:4 - has not had treatment for this yet. Given last RPR was 2 years ago and it is not clear to me when her partner gave it to her we will prepare for late latent treatment with 3 rounds of bicillin injections - 2.4 million units weekly. She preferred this over another longer course of doxycycline BID since it has been hard for her to take her PM doses of meds.  Sexual health discussed and questions answered today.  Will repeat titers again in 3 m to trend for adequate response.       Proctocolitis    Likely due to syphilis (would be secondary stage) but resolved after ceftriaxone + doxy (7d) course. Given uncertainty of timeline, out of caution, will give longer course of treatment.        SJanene Madeira MSN, NP-C [facility], LLCfor Infectious Disease [email or web address] Pager: [phone]: [phone] [phone]

## 2021-08-11 NOTE — Assessment & Plan Note (Signed)
Recently diagnosed with syphilis with newly reactive titer 1:4 - has not had treatment for this yet. Given last RPR was 2 years ago and it is not clear to me when her partner gave it to her we will prepare for late latent treatment with 3 rounds of bicillin injections - 2.4 million units weekly. She preferred this over another longer course of doxycycline BID since it has been hard for her to take her PM doses of meds.  Sexual health discussed and questions answered today.  Will repeat titers again in 3 m to trend for adequate response.

## 2021-08-13 LAB — HIV-1 RNA QUANT-NO REFLEX-BLD
HIV 1 RNA Quant: 38 Copies/mL — ABNORMAL HIGH
HIV-1 RNA Quant, Log: 1.58 Log cps/mL — ABNORMAL HIGH

## 2021-08-18 ENCOUNTER — Ambulatory Visit (INDEPENDENT_AMBULATORY_CARE_PROVIDER_SITE_OTHER): Payer: Medicare Other

## 2021-08-18 ENCOUNTER — Other Ambulatory Visit: Payer: Self-pay

## 2021-08-18 ENCOUNTER — Other Ambulatory Visit: Payer: Self-pay | Admitting: Cardiovascular Disease

## 2021-08-18 DIAGNOSIS — A539 Syphilis, unspecified: Secondary | ICD-10-CM

## 2021-08-18 MED ORDER — PENICILLIN G BENZATHINE 1200000 UNIT/2ML IM SUSY
1.2000 10*6.[IU] | PREFILLED_SYRINGE | Freq: Once | INTRAMUSCULAR | Status: AC
  Administered 2021-08-18: 1.2 10*6.[IU] via INTRAMUSCULAR

## 2021-08-18 NOTE — Progress Notes (Signed)
Patient in the office today for syphilis treatment 2 of 3 weekly penicillin injections. Patient tolerated injection well. Decline condoms.  Sherry Hall

## 2021-08-25 ENCOUNTER — Other Ambulatory Visit: Payer: Self-pay

## 2021-08-25 ENCOUNTER — Ambulatory Visit (INDEPENDENT_AMBULATORY_CARE_PROVIDER_SITE_OTHER): Payer: Medicare Other

## 2021-08-25 DIAGNOSIS — A539 Syphilis, unspecified: Secondary | ICD-10-CM

## 2021-08-25 MED ORDER — PENICILLIN G BENZATHINE 1200000 UNIT/2ML IM SUSY
1.2000 10*6.[IU] | PREFILLED_SYRINGE | Freq: Once | INTRAMUSCULAR | Status: AC
  Administered 2021-08-25: 1.2 10*6.[IU] via INTRAMUSCULAR

## 2021-08-25 NOTE — Progress Notes (Signed)
Reviewed and verified allergies with patient. Patient tolerated Bicillin injections well. Reinforced abstinence until treatment completed plus an additional 10 days, offered condoms and encouraged use. Advised patient to notify sexual partners for testing and treatment. Patient verbalized understanding.   Mckenley Birenbaum D Zyir Gassert, RN  

## 2021-09-15 ENCOUNTER — Other Ambulatory Visit: Payer: Self-pay | Admitting: Cardiovascular Disease

## 2021-09-24 ENCOUNTER — Other Ambulatory Visit: Payer: Self-pay | Admitting: Cardiovascular Disease

## 2021-10-07 ENCOUNTER — Ambulatory Visit: Payer: Medicare Other | Admitting: Family Medicine

## 2021-10-21 ENCOUNTER — Ambulatory Visit: Payer: Medicare Other | Admitting: Family Medicine

## 2021-10-22 ENCOUNTER — Telehealth: Payer: Self-pay | Admitting: Cardiovascular Disease

## 2021-10-22 NOTE — Telephone Encounter (Signed)
Called patient to schedule sleep compliance appt on both 10/21/21 and 10/22/21, MB not set up on mobile/home phone number. Work number MB is full.

## 2021-11-03 ENCOUNTER — Ambulatory Visit: Payer: Medicare Other | Admitting: Infectious Diseases

## 2021-11-11 ENCOUNTER — Other Ambulatory Visit: Payer: Medicare Other

## 2021-12-18 ENCOUNTER — Ambulatory Visit (INDEPENDENT_AMBULATORY_CARE_PROVIDER_SITE_OTHER): Payer: Medicare Other | Admitting: Family Medicine

## 2021-12-18 ENCOUNTER — Other Ambulatory Visit: Payer: Self-pay

## 2021-12-18 DIAGNOSIS — Z0000 Encounter for general adult medical examination without abnormal findings: Secondary | ICD-10-CM

## 2021-12-18 DIAGNOSIS — Z01419 Encounter for gynecological examination (general) (routine) without abnormal findings: Secondary | ICD-10-CM | POA: Diagnosis not present

## 2021-12-18 NOTE — Progress Notes (Signed)
Established Patient Annual Wellness Visit  Subjective:  Patient ID: Sherry Hall, female    DOB: 02-07-1981  Age: 41 y.o. MRN: [id number]  CC: No chief complaint on file.   HPI Sherry Hall presents for her annual wellness visit.  Past Medical History:  Diagnosis Date   Anemia    H/O   Anxiety    Depression    Diabetes mellitus type 1 ([place])    GERD (gastroesophageal reflux disease)    HIV (human immunodeficiency virus infection) ([place]) 2011   Hypertension    Type 2 diabetes mellitus [facility])     Past Surgical History:  Procedure Laterality Date   CESAREAN SECTION  2001, 2008, 2009, 2011   CHOLECYSTECTOMY     EXCISION OF BREAST LESION N/A 01/04/2018   Procedure: EXCISION OF CHEST WALL ABSCESS. MIDDLE CHEST;  Surgeon: Olean Ree, MD;  Location: [facility] ORS;  Service: General;  Laterality: N/A;  resection of chest wall cyst / abscess   FRACTURE SURGERY Right    HAND   TONSILLECTOMY     TUBAL LIGATION  2011    Family History  Problem Relation Age of Onset   Diabetes Father    Hypertension Father    Cancer Mother        cervical     Social History   Socioeconomic History   Marital status: Single    Spouse name: Not on file   Number of children: Not on file   Years of education: Not on file   Highest education level: Not on file  Occupational History   Not on file  Tobacco Use   Smoking status: Every Day    Packs/day: 0.50    Years: 15.00    Pack years: 7.50    Types: Cigarettes   Smokeless tobacco: Never   Tobacco comments:    trying to slow  Vaping Use   Vaping Use: Never used  Substance and Sexual Activity   Alcohol use: Yes    Alcohol/week: 1.0 standard drink    Types: 1 Standard drinks or equivalent per week    Comment: socially   Drug use: No   Sexual activity: Yes    Partners: Male    Birth control/protection: Surgical    Comment: declined condoms  Other Topics Concern   Not on file  Social History Narrative   Not on file    Social Determinants of Health   Financial Resource Strain: Low Risk    Difficulty of Paying Living Expenses: Not very hard  Food Insecurity: No Food Insecurity   Worried About Charity fundraiser in the Last Year: Never true   Ran Out of Food in the Last Year: Never true  Transportation Needs: No Transportation Needs   Lack of Transportation (Medical): No   Lack of Transportation (Non-Medical): No  Physical Activity: Inactive   Days of Exercise per Week: 0 days   Minutes of Exercise per Session: 0 min  Stress: Stress Concern Present   Feeling of Stress : Very much  Social Connections: Moderately Isolated   Frequency of Communication with Friends and Family: More than three times a week   Frequency of Social Gatherings with Friends and Family: Never   Attends Religious Services: Never   Marine scientist or Organizations: No   Attends Archivist Meetings: Never   Marital Status: Living with partner  Intimate Partner Violence: Not At Risk   Fear of Current or Ex-Partner: No   Emotionally Abused: No  Physically Abused: No   Sexually Abused: No    Outpatient Medications Prior to Visit  Medication Sig Dispense Refill   Accu-Chek FastClix Lancets MISC 1 Units by Does not apply route 4 (four) times daily as needed. 102 each 12   albuterol (VENTOLIN HFA) 108 (90 Base) MCG/ACT inhaler Inhale 2 puffs into the lungs every 4 (four) hours as needed for wheezing or shortness of breath. 8 g 0   ALPRAZolam (XANAX) 1 MG tablet Take 1 mg by mouth 2 (two) times daily as needed for anxiety (panic attack).     atorvastatin (LIPITOR) 10 MG tablet TAKE ONE TABLET BY MOUTH DAILY 90 tablet 0   BIKTARVY 50-200-25 MG TABS tablet TAKE ONE TABLET BY MOUTH DAILY 30 tablet 5   blood glucose meter kit and supplies KIT Dispense based on patient and insurance preference. Use up to four times daily as directed. (FOR ICD-9 250.00, 250.01). 1 each 0   Blood Glucose Monitoring Suppl (ACCU-CHEK  GUIDE ME) w/Device KIT 1 Units by Does not apply route 4 (four) times daily as needed. 1 kit 0   Blood Pressure Monitoring (BLOOD PRESSURE CUFF) MISC 1 Units by Does not apply route daily. Please dispense on EXTRA LARGE (up to 52cm) blood pressure cuff kit 1 each 0   buPROPion (WELLBUTRIN XL) 300 MG 24 hr tablet Take 300 mg by mouth daily.     busPIRone (BUSPAR) 10 MG tablet Take 10 mg by mouth 3 (three) times daily.     carvedilol (COREG) 25 MG tablet Take 1 tablet (25 mg total) by mouth 2 (two) times daily. 180 tablet 3   furosemide (LASIX) 40 MG tablet Take 1 tablet (40 mg total) by mouth daily. Patient need a appointment for future refills. 30 tablet 0   GLOBAL EASE INJECT PEN NEEDLES 32G X 4 MM MISC USE AS DIRECTED TO ADMINISTER INSULIN THREE TIMES DAILY BEFORE MEALS 100 each 9   glucose blood (ACCU-CHEK GUIDE) test strip Use 3 times a day. Dx code: E11.9 100 each 12   insulin aspart (NOVOLOG) 100 UNIT/ML FlexPen Per sliding scale. Max daily dose 40 units. (Patient taking differently: Inject 2-10 Units into the skin See admin instructions. Injects 2-10 units into the skin per sliding scale. Max daily dose 40 units. 150-199 mg/dl : 2 units; 200-249 mg/dL: 4 units; 250 - 299 mg/dL: 6 units; 300 - 349 mg/dL: 8 units; 350 -399 mg/ dL: 10 units) 15 mL 11   Insulin Glargine (LANTUS SOLOSTAR) 100 UNIT/ML Solostar Pen INJECT 30 UNITS INTO THE SKIN DAILY AT 10 IN THE EVENING (Patient taking differently: Inject 30 Units into the skin daily.) 15 mL 11   JARDIANCE 10 MG TABS tablet TAKE ONE TABLET BY MOUTH DAILY 30 tablet 6   ondansetron (ZOFRAN ODT) 4 MG disintegrating tablet Take 1 tablet (4 mg total) by mouth every 8 (eight) hours as needed for nausea or vomiting. 20 tablet 0   polyethylene glycol powder (GLYCOLAX/MIRALAX) 17 GM/SCOOP powder Take 17 g by mouth 2 (two) times daily as needed for mild constipation or moderate constipation. 3350 g 1   sacubitril-valsartan (ENTRESTO) 97-103 MG Take 1 tablet by  mouth 2 (two) times daily. 180 tablet 3   sertraline (ZOLOFT) 100 MG tablet Take by mouth daily.     spironolactone (ALDACTONE) 25 MG tablet TAKE HALF TABLET BY MOUTH AT BEDTIME 15 tablet 0   traZODone (DESYREL) 50 MG tablet Take 50-100 mg by mouth at bedtime as needed for sleep.  1   No facility-administered medications prior to visit.    Allergies  Allergen Reactions   Percocet [Oxycodone-Acetaminophen] Hives, Itching and Rash   Latex Rash    ROS Review of Systems  All other systems reviewed and are negative.    Objective:    Physical Exam  There were no vitals taken for this visit. Wt Readings from Last 3 Encounters:  08/11/21 273 lb (123.8 kg)  07/22/21 273 lb 0.8 oz (123.9 kg)  07/18/21 280 lb 9.6 oz (127.3 kg)     Health Maintenance Due  Topic Date Due   COVID-19 Vaccine (1) Never done   Hepatitis C Screening  Never done   PAP SMEAR-Modifier  Never done   OPHTHALMOLOGY EXAM  04/06/2017   FOOT EXAM  06/05/2017    There are no preventive care reminders to display for this patient.  Lab Results  Component Value Date   TSH 1.42 07/08/2016   Lab Results  Component Value Date   WBC 5.8 07/18/2021   HGB 12.4 07/18/2021   HCT 36.3 07/18/2021   MCV 95 07/18/2021   PLT 200 07/18/2021   Lab Results  Component Value Date   NA 135 07/18/2021   K 4.5 07/18/2021   CO2 17 (L) 07/18/2021   GLUCOSE 239 (H) 07/18/2021   BUN 19 07/18/2021   CREATININE 0.90 07/18/2021   BILITOT 0.2 07/18/2021   ALKPHOS 82 07/18/2021   AST 11 07/18/2021   ALT 14 07/18/2021   PROT 7.3 07/18/2021   ALBUMIN 3.6 (L) 07/18/2021   CALCIUM 8.6 (L) 07/18/2021   ANIONGAP 8 12/18/2020   EGFR 83 07/18/2021   Lab Results  Component Value Date   CHOL 235 (H) 12/26/2018   Lab Results  Component Value Date   HDL 51 12/26/2018   Lab Results  Component Value Date   LDLCALC 147 (H) 12/26/2018   Lab Results  Component Value Date   TRIG 230 (H) 12/26/2018   Lab Results  Component  Value Date   CHOLHDL 4.6 12/26/2018   Lab Results  Component Value Date   HGBA1C 8.1 (A) 07/01/2021   HGBA1C 8.1 07/01/2021   HGBA1C 8.1 (A) 07/01/2021   HGBA1C 8.1 (A) 07/01/2021      Assessment & Plan:   Problem List Items Addressed This Visit   None Visit Diagnoses     Encounter for Medicare annual wellness exam    -  Primary   Well woman exam with routine gynecological exam           No orders of the defined types were placed in this encounter.   Follow-up: Return in 1 year (on 12/19/2022).    Shirley Muscat, CMA  Subjective:   Sherry Hall is a 41 y.o. female who presents for Medicare Annual (Subsequent) preventive examination.  Review of Systems           Objective:    There were no vitals filed for this visit. There is no height or weight on file to calculate BMI.  Advanced Directives 12/18/2021 12/11/2020 01/04/2018 12/31/2017 08/20/2017 07/22/2017 10/20/2016  Does Patient Have a Medical Advance Directive? No No No No No No No  Would patient like information on creating a medical advance directive? No - Patient declined No - Patient declined No - Patient declined - - - -    Current Medications (verified) Outpatient Encounter Medications as of 12/18/2021  Medication Sig   Accu-Chek FastClix Lancets MISC 1 Units by Does not apply route 4 (  four) times daily as needed.   albuterol (VENTOLIN HFA) 108 (90 Base) MCG/ACT inhaler Inhale 2 puffs into the lungs every 4 (four) hours as needed for wheezing or shortness of breath.   ALPRAZolam (XANAX) 1 MG tablet Take 1 mg by mouth 2 (two) times daily as needed for anxiety (panic attack).   atorvastatin (LIPITOR) 10 MG tablet TAKE ONE TABLET BY MOUTH DAILY   BIKTARVY 50-200-25 MG TABS tablet TAKE ONE TABLET BY MOUTH DAILY   blood glucose meter kit and supplies KIT Dispense based on patient and insurance preference. Use up to four times daily as directed. (FOR ICD-9 250.00, 250.01).   Blood Glucose Monitoring Suppl  (ACCU-CHEK GUIDE ME) w/Device KIT 1 Units by Does not apply route 4 (four) times daily as needed.   Blood Pressure Monitoring (BLOOD PRESSURE CUFF) MISC 1 Units by Does not apply route daily. Please dispense on EXTRA LARGE (up to 52cm) blood pressure cuff kit   buPROPion (WELLBUTRIN XL) 300 MG 24 hr tablet Take 300 mg by mouth daily.   busPIRone (BUSPAR) 10 MG tablet Take 10 mg by mouth 3 (three) times daily.   carvedilol (COREG) 25 MG tablet Take 1 tablet (25 mg total) by mouth 2 (two) times daily.   furosemide (LASIX) 40 MG tablet Take 1 tablet (40 mg total) by mouth daily. Patient need a appointment for future refills.   GLOBAL EASE INJECT PEN NEEDLES 32G X 4 MM MISC USE AS DIRECTED TO ADMINISTER INSULIN THREE TIMES DAILY BEFORE MEALS   glucose blood (ACCU-CHEK GUIDE) test strip Use 3 times a day. Dx code: E11.9   insulin aspart (NOVOLOG) 100 UNIT/ML FlexPen Per sliding scale. Max daily dose 40 units. (Patient taking differently: Inject 2-10 Units into the skin See admin instructions. Injects 2-10 units into the skin per sliding scale. Max daily dose 40 units. 150-199 mg/dl : 2 units; 200-249 mg/dL: 4 units; 250 - 299 mg/dL: 6 units; 300 - 349 mg/dL: 8 units; 350 -399 mg/ dL: 10 units)   Insulin Glargine (LANTUS SOLOSTAR) 100 UNIT/ML Solostar Pen INJECT 30 UNITS INTO THE SKIN DAILY AT 10 IN THE EVENING (Patient taking differently: Inject 30 Units into the skin daily.)   JARDIANCE 10 MG TABS tablet TAKE ONE TABLET BY MOUTH DAILY   ondansetron (ZOFRAN ODT) 4 MG disintegrating tablet Take 1 tablet (4 mg total) by mouth every 8 (eight) hours as needed for nausea or vomiting.   polyethylene glycol powder (GLYCOLAX/MIRALAX) 17 GM/SCOOP powder Take 17 g by mouth 2 (two) times daily as needed for mild constipation or moderate constipation.   sacubitril-valsartan (ENTRESTO) 97-103 MG Take 1 tablet by mouth 2 (two) times daily.   sertraline (ZOLOFT) 100 MG tablet Take by mouth daily.   spironolactone  (ALDACTONE) 25 MG tablet TAKE HALF TABLET BY MOUTH AT BEDTIME   traZODone (DESYREL) 50 MG tablet Take 50-100 mg by mouth at bedtime as needed for sleep.    No facility-administered encounter medications on file as of 12/18/2021.    Allergies (verified) Percocet [oxycodone-acetaminophen] and Latex   History: Past Medical History:  Diagnosis Date   Anemia    H/O   Anxiety    Depression    Diabetes mellitus type 1 ([place])    GERD (gastroesophageal reflux disease)    HIV (human immunodeficiency virus infection) ([place]) 2011   Hypertension    Type 2 diabetes mellitus [facility])    Past Surgical History:  Procedure Laterality Date   CESAREAN SECTION  2001, 2008, 2009,  2011   CHOLECYSTECTOMY     EXCISION OF BREAST LESION N/A 01/04/2018   Procedure: EXCISION OF CHEST WALL ABSCESS. MIDDLE CHEST;  Surgeon: Olean Ree, MD;  Location: [facility] ORS;  Service: General;  Laterality: N/A;  resection of chest wall cyst / abscess   FRACTURE SURGERY Right    HAND   TONSILLECTOMY     TUBAL LIGATION  2011   Family History  Problem Relation Age of Onset   Diabetes Father    Hypertension Father    Cancer Mother        cervical    Social History   Socioeconomic History   Marital status: Single    Spouse name: Not on file   Number of children: Not on file   Years of education: Not on file   Highest education level: Not on file  Occupational History   Not on file  Tobacco Use   Smoking status: Every Day    Packs/day: 0.50    Years: 15.00    Pack years: 7.50    Types: Cigarettes   Smokeless tobacco: Never   Tobacco comments:    trying to slow  Vaping Use   Vaping Use: Never used  Substance and Sexual Activity   Alcohol use: Yes    Alcohol/week: 1.0 standard drink    Types: 1 Standard drinks or equivalent per week    Comment: socially   Drug use: No   Sexual activity: Yes    Partners: Male    Birth control/protection: Surgical    Comment: declined condoms  Other Topics Concern   Not on  file  Social History Narrative   Not on file   Social Determinants of Health   Financial Resource Strain: Low Risk    Difficulty of Paying Living Expenses: Not very hard  Food Insecurity: No Food Insecurity   Worried About Charity fundraiser in the Last Year: Never true   Ran Out of Food in the Last Year: Never true  Transportation Needs: No Transportation Needs   Lack of Transportation (Medical): No   Lack of Transportation (Non-Medical): No  Physical Activity: Inactive   Days of Exercise per Week: 0 days   Minutes of Exercise per Session: 0 min  Stress: Stress Concern Present   Feeling of Stress : Very much  Social Connections: Moderately Isolated   Frequency of Communication with Friends and Family: More than three times a week   Frequency of Social Gatherings with Friends and Family: Never   Attends Religious Services: Never   Marine scientist or Organizations: No   Attends Music therapist: Never   Marital Status: Living with partner    Tobacco Counseling Ready to quit: Not Answered Counseling given: Not Answered Tobacco comments: trying to slow   Clinical Intake:  Pre-visit preparation completed: Yes  Pain : No/denies pain     Diabetes: Yes CBG done?: Yes (07/01/21) CBG resulted in Enter/ Edit results?: Yes Did pt. bring in CBG monitor from home?: No  How often do you need to have someone help you when you read instructions, pamphlets, or other written materials from your doctor or pharmacy?: 1 - Never What is the last grade level you completed in school?: some college  Diabetic?yes  Interpreter Needed?: No  Information entered by :: Blair Heys, RMA   Activities of Daily Living In your present state of health, do you have any difficulty performing the following activities: 12/18/2021  Hearing? N  Vision? N  Difficulty concentrating or making decisions? Y  Comment often  Walking or climbing stairs? N  Dressing or bathing? N   Doing errands, shopping? N  Some recent data might be hidden    Patient Care Team: Dorena Dew, FNP as PCP - General (Family Medicine) O'Neal, Cassie Freer, MD as PCP - Cardiology (Cardiology) Oran Callas, NP as Nurse Practitioner (Infectious Diseases)  Indicate any recent Medical Services you may have received from other than [facility] providers in the past year (date may be approximate).     Assessment:   This is a routine wellness examination for Sherry Hall.  Hearing/Vision screen No results found.  Dietary issues and exercise activities discussed: Current Exercise Habits: The patient does not participate in regular exercise at present, Exercise limited by: None identified   Goals Addressed             This Visit's Progress    Quit smoking / using tobacco   On track    Pt states she is still working on her goal of quitting tobacco use       Depression Screen PHQ 2/9 Scores 12/18/2021 08/11/2021 07/18/2021 07/01/2021 04/18/2021 12/24/2020 08/19/2020  PHQ - 2 Score '3 2 6 6 2 3 ' 0  PHQ- 9 Score '15 11 15 14 6 9 ' -    Fall Risk Fall Risk  12/18/2021 12/18/2021 07/18/2021 07/01/2021 05/13/2021  Falls in the past year? 0 0 0 0 1  Number falls in past yr: 0 0 0 0 0  Injury with Fall? 0 0 0 0 0  Risk for fall due to : No Fall Risks No Fall Risks - - -  Follow up - Falls evaluation completed - - -    FALL RISK PREVENTION PERTAINING TO THE HOME:  Any stairs in or around the home? No  If so, are there any without handrails? No  Home free of loose throw rugs in walkways, pet beds, electrical cords, etc? Yes  Adequate lighting in your home to reduce risk of falls? Yes   ASSISTIVE DEVICES UTILIZED TO PREVENT FALLS:  Life alert? No  Use of a cane, walker or w/c? No  Grab bars in the bathroom? No  Shower chair or bench in shower? No  Elevated toilet seat or a handicapped toilet? No   TIMED UP AND GO:  Was the test performed? No .  Length of time to ambulate 10 feet: N/A  sec.     Cognitive Function:     6CIT Screen 12/18/2021  What Year? 0 points  What month? 0 points  What time? 0 points  Count back from 20 0 points  Months in reverse 0 points  Repeat phrase 0 points  Total Score 0    Immunizations Immunization History  Administered Date(s) Administered   Hep A / Hep B 01/28/2010, 03/11/2010   Hepatitis A, Adult 01/28/2010, 03/11/2010   Hepatitis B 01/28/2010, 03/11/2010   Influenza,inj,Quad PF,6+ Mos 09/16/2015, 08/10/2019   Influenza-Unspecified 11/16/2011, 10/20/2012   PPD Test 01/28/2010, 11/16/2011, 01/11/2013   Pneumococcal Conjugate-13 10/20/2012, 01/03/2016   Pneumococcal Polysaccharide-23 02/04/2010, 10/20/2012   Tdap 01/11/2013    TDAP status: Due, Education has been provided regarding the importance of this vaccine. Advised may receive this vaccine at local pharmacy or Health Dept. Aware to provide a copy of the vaccination record if obtained from local pharmacy or Health Dept. Verbalized acceptance and understanding.  Flu Vaccine status: Declined, Education has been provided regarding the importance of this vaccine but patient  still declined. Advised may receive this vaccine at local pharmacy or Health Dept. Aware to provide a copy of the vaccination record if obtained from local pharmacy or Health Dept. Verbalized acceptance and understanding.  Pneumococcal vaccine status: Due, Education has been provided regarding the importance of this vaccine. Advised may receive this vaccine at local pharmacy or Health Dept. Aware to provide a copy of the vaccination record if obtained from local pharmacy or Health Dept. Verbalized acceptance and understanding.  Covid-19 vaccine status: Declined, Education has been provided regarding the importance of this vaccine but patient still declined. Advised may receive this vaccine at local pharmacy or Health Dept.or vaccine clinic. Aware to provide a copy of the vaccination record if obtained from local  pharmacy or Health Dept. Verbalized acceptance and understanding.  Qualifies for Shingles Vaccine? No   Zostavax completed No   Shingrix Completed?: No.    Education has been provided regarding the importance of this vaccine. Patient has been advised to call insurance company to determine out of pocket expense if they have not yet received this vaccine. Advised may also receive vaccine at local pharmacy or Health Dept. Verbalized acceptance and understanding.  Screening Tests Health Maintenance  Topic Date Due   COVID-19 Vaccine (1) Never done   Hepatitis C Screening  Never done   PAP SMEAR-Modifier  Never done   OPHTHALMOLOGY EXAM  04/06/2017   FOOT EXAM  06/05/2017   INFLUENZA VACCINE  01/16/2022 (Originally 05/19/2021)   HEMOGLOBIN A1C  12/29/2021   TETANUS/TDAP  06/05/2026   HIV Screening  Completed   HPV VACCINES  Aged Out    Health Maintenance  Health Maintenance Due  Topic Date Due   COVID-19 Vaccine (1) Never done   Hepatitis C Screening  Never done   PAP SMEAR-Modifier  Never done   OPHTHALMOLOGY EXAM  04/06/2017   FOOT EXAM  06/05/2017    Colorectal cancer screening: No longer required.   Mammogram status: No longer required due to age.    Lung Cancer Screening: (Low Dose CT Chest recommended if Age 71-80 years, 30 pack-year currently smoking OR have quit w/in 15years.) does not qualify.   Lung Cancer Screening Referral: No Referral   Additional Screening:  Hepatitis C Screening: does not qualify; Completed   Vision Screening: Recommended annual ophthalmology exams for early detection of glaucoma and other disorders of the eye. Is the patient up to date with their annual eye exam?  No  Who is the provider or what is the name of the office in which the patient attends annual eye exams?  If pt is not established with a provider, would they like to be referred to a provider to establish care?  Pt has a PCP .   Dental Screening: Recommended annual dental exams  for proper oral hygiene  Community Resource Referral / Chronic Care Management: CRR required this visit?  No   CCM required this visit?  No      Plan:     I have personally reviewed and noted the following in the patients chart:   Medical and social history Use of alcohol, tobacco or illicit drugs  Current medications and supplements including opioid prescriptions.  Functional ability and status Nutritional status Physical activity Advanced directives List of other physicians Hospitalizations, surgeries, and ER visits in previous 12 months Vitals Screenings to include cognitive, depression, and falls Referrals and appointments  In addition, I have reviewed and discussed with patient certain preventive protocols, quality metrics, and best practice  recommendations. A written personalized care plan for preventive services as well as general preventive health recommendations were provided to patient.     Shirley Muscat, [place]   12/18/2021   Nurse Notes:         Subjective:   Sherry Hall is a 41 y.o. female who presents for Medicare Annual (Subsequent) preventive examination.  Review of Systems           Objective:    There were no vitals filed for this visit. There is no height or weight on file to calculate BMI.  Advanced Directives 12/18/2021 12/11/2020 01/04/2018 12/31/2017 08/20/2017 07/22/2017 10/20/2016  Does Patient Have a Medical Advance Directive? No No No No No No No  Would patient like information on creating a medical advance directive? No - Patient declined No - Patient declined No - Patient declined - - - -    Current Medications (verified) Outpatient Encounter Medications as of 12/18/2021  Medication Sig   Accu-Chek FastClix Lancets MISC 1 Units by Does not apply route 4 (four) times daily as needed.   albuterol (VENTOLIN HFA) 108 (90 Base) MCG/ACT inhaler Inhale 2 puffs into the lungs every 4 (four) hours as needed for wheezing or shortness of  breath.   ALPRAZolam (XANAX) 1 MG tablet Take 1 mg by mouth 2 (two) times daily as needed for anxiety (panic attack).   atorvastatin (LIPITOR) 10 MG tablet TAKE ONE TABLET BY MOUTH DAILY   BIKTARVY 50-200-25 MG TABS tablet TAKE ONE TABLET BY MOUTH DAILY   blood glucose meter kit and supplies KIT Dispense based on patient and insurance preference. Use up to four times daily as directed. (FOR ICD-9 250.00, 250.01).   Blood Glucose Monitoring Suppl (ACCU-CHEK GUIDE ME) w/Device KIT 1 Units by Does not apply route 4 (four) times daily as needed.   Blood Pressure Monitoring (BLOOD PRESSURE CUFF) MISC 1 Units by Does not apply route daily. Please dispense on EXTRA LARGE (up to 52cm) blood pressure cuff kit   buPROPion (WELLBUTRIN XL) 300 MG 24 hr tablet Take 300 mg by mouth daily.   busPIRone (BUSPAR) 10 MG tablet Take 10 mg by mouth 3 (three) times daily.   carvedilol (COREG) 25 MG tablet Take 1 tablet (25 mg total) by mouth 2 (two) times daily.   furosemide (LASIX) 40 MG tablet Take 1 tablet (40 mg total) by mouth daily. Patient need a appointment for future refills.   GLOBAL EASE INJECT PEN NEEDLES 32G X 4 MM MISC USE AS DIRECTED TO ADMINISTER INSULIN THREE TIMES DAILY BEFORE MEALS   glucose blood (ACCU-CHEK GUIDE) test strip Use 3 times a day. Dx code: E11.9   insulin aspart (NOVOLOG) 100 UNIT/ML FlexPen Per sliding scale. Max daily dose 40 units. (Patient taking differently: Inject 2-10 Units into the skin See admin instructions. Injects 2-10 units into the skin per sliding scale. Max daily dose 40 units. 150-199 mg/dl : 2 units; 200-249 mg/dL: 4 units; 250 - 299 mg/dL: 6 units; 300 - 349 mg/dL: 8 units; 350 -399 mg/ dL: 10 units)   Insulin Glargine (LANTUS SOLOSTAR) 100 UNIT/ML Solostar Pen INJECT 30 UNITS INTO THE SKIN DAILY AT 10 IN THE EVENING (Patient taking differently: Inject 30 Units into the skin daily.)   JARDIANCE 10 MG TABS tablet TAKE ONE TABLET BY MOUTH DAILY   ondansetron (ZOFRAN ODT)  4 MG disintegrating tablet Take 1 tablet (4 mg total) by mouth every 8 (eight) hours as needed for nausea or vomiting.   polyethylene  glycol powder (GLYCOLAX/MIRALAX) 17 GM/SCOOP powder Take 17 g by mouth 2 (two) times daily as needed for mild constipation or moderate constipation.   sacubitril-valsartan (ENTRESTO) 97-103 MG Take 1 tablet by mouth 2 (two) times daily.   sertraline (ZOLOFT) 100 MG tablet Take by mouth daily.   spironolactone (ALDACTONE) 25 MG tablet TAKE HALF TABLET BY MOUTH AT BEDTIME   traZODone (DESYREL) 50 MG tablet Take 50-100 mg by mouth at bedtime as needed for sleep.    No facility-administered encounter medications on file as of 12/18/2021.    Allergies (verified) Percocet [oxycodone-acetaminophen] and Latex   History: Past Medical History:  Diagnosis Date   Anemia    H/O   Anxiety    Depression    Diabetes mellitus type 1 ([place])    GERD (gastroesophageal reflux disease)    HIV (human immunodeficiency virus infection) ([place]) 2011   Hypertension    Type 2 diabetes mellitus [facility])    Past Surgical History:  Procedure Laterality Date   CESAREAN SECTION  2001, 2008, 2009, 2011   CHOLECYSTECTOMY     EXCISION OF BREAST LESION N/A 01/04/2018   Procedure: EXCISION OF CHEST WALL ABSCESS. MIDDLE CHEST;  Surgeon: Olean Ree, MD;  Location: [facility] ORS;  Service: General;  Laterality: N/A;  resection of chest wall cyst / abscess   FRACTURE SURGERY Right    HAND   TONSILLECTOMY     TUBAL LIGATION  2011   Family History  Problem Relation Age of Onset   Diabetes Father    Hypertension Father    Cancer Mother        cervical    Social History   Socioeconomic History   Marital status: Single    Spouse name: Not on file   Number of children: Not on file   Years of education: Not on file   Highest education level: Not on file  Occupational History   Not on file  Tobacco Use   Smoking status: Every Day    Packs/day: 0.50    Years: 15.00    Pack years: 7.50     Types: Cigarettes   Smokeless tobacco: Never   Tobacco comments:    trying to slow  Vaping Use   Vaping Use: Never used  Substance and Sexual Activity   Alcohol use: Yes    Alcohol/week: 1.0 standard drink    Types: 1 Standard drinks or equivalent per week    Comment: socially   Drug use: No   Sexual activity: Yes    Partners: Male    Birth control/protection: Surgical    Comment: declined condoms  Other Topics Concern   Not on file  Social History Narrative   Not on file   Social Determinants of Health   Financial Resource Strain: Low Risk    Difficulty of Paying Living Expenses: Not very hard  Food Insecurity: No Food Insecurity   Worried About Charity fundraiser in the Last Year: Never true   Ran Out of Food in the Last Year: Never true  Transportation Needs: No Transportation Needs   Lack of Transportation (Medical): No   Lack of Transportation (Non-Medical): No  Physical Activity: Inactive   Days of Exercise per Week: 0 days   Minutes of Exercise per Session: 0 min  Stress: Stress Concern Present   Feeling of Stress : Very much  Social Connections: Moderately Isolated   Frequency of Communication with Friends and Family: More than three times a week   Frequency of  Social Gatherings with Friends and Family: Never   Attends Religious Services: Never   Marine scientist or Organizations: No   Attends Music therapist: Never   Marital Status: Living with partner    Tobacco Counseling Ready to quit: Not Answered Counseling given: Not Answered Tobacco comments: trying to slow   Clinical Intake:  Pre-visit preparation completed: Yes  Pain : No/denies pain     Diabetes: Yes CBG done?: Yes (07/01/21) CBG resulted in Enter/ Edit results?: Yes Did pt. bring in CBG monitor from home?: No  How often do you need to have someone help you when you read instructions, pamphlets, or other written materials from your doctor or pharmacy?: 1 -  Never What is the last grade level you completed in school?: some college  Diabetic? Yes  Interpreter Needed?: No  Information entered by :: Blair Heys, RMA   Activities of Daily Living In your present state of health, do you have any difficulty performing the following activities: 12/18/2021  Hearing? N  Vision? N  Difficulty concentrating or making decisions? Y  Comment often  Walking or climbing stairs? N  Dressing or bathing? N  Doing errands, shopping? N  Some recent data might be hidden    Patient Care Team: Dorena Dew, FNP as PCP - General (Family Medicine) O'Neal, Cassie Freer, MD as PCP - Cardiology (Cardiology) Wooster Callas, NP as Nurse Practitioner (Infectious Diseases)  Indicate any recent Medical Services you may have received from other than [facility] providers in the past year (date may be approximate).     Assessment:   This is a routine wellness examination for Sherry Hall.  Hearing/Vision screen No results found.  Dietary issues and exercise activities discussed: Current Exercise Habits: The patient does not participate in regular exercise at present, Exercise limited by: None identified   Goals Addressed             This Visit's Progress    Quit smoking / using tobacco   On track    Pt states she is still working on her goal of quitting tobacco use      Depression Screen PHQ 2/9 Scores 12/18/2021 08/11/2021 07/18/2021 07/01/2021 04/18/2021 12/24/2020 08/19/2020  PHQ - 2 Score '3 2 6 6 2 3 ' 0  PHQ- 9 Score '15 11 15 14 6 9 ' -    Fall Risk Fall Risk  12/18/2021 12/18/2021 07/18/2021 07/01/2021 05/13/2021  Falls in the past year? 0 0 0 0 1  Number falls in past yr: 0 0 0 0 0  Injury with Fall? 0 0 0 0 0  Risk for fall due to : No Fall Risks No Fall Risks - - -  Follow up - Falls evaluation completed - - -    FALL RISK PREVENTION PERTAINING TO THE HOME:  Any stairs in or around the home? No  If so, are there any without handrails? No  Home free of  loose throw rugs in walkways, pet beds, electrical cords, etc? No  Adequate lighting in your home to reduce risk of falls? No   ASSISTIVE DEVICES UTILIZED TO PREVENT FALLS:  Life alert? No  Use of a cane, walker or w/c? No  Grab bars in the bathroom? No  Shower chair or bench in shower? No  Elevated toilet seat or a handicapped toilet? No   TIMED UP AND GO:  Was the test performed? No .  Length of time to ambulate 10 feet: 0 sec.  Cognitive Function:     6CIT Screen 12/18/2021  What Year? 0 points  What month? 0 points  What time? 0 points  Count back from 20 0 points  Months in reverse 0 points  Repeat phrase 0 points  Total Score 0    Immunizations Immunization History  Administered Date(s) Administered   Hep A / Hep B 01/28/2010, 03/11/2010   Hepatitis A, Adult 01/28/2010, 03/11/2010   Hepatitis B 01/28/2010, 03/11/2010   Influenza,inj,Quad PF,6+ Mos 09/16/2015, 08/10/2019   Influenza-Unspecified 11/16/2011, 10/20/2012   PPD Test 01/28/2010, 11/16/2011, 01/11/2013   Pneumococcal Conjugate-13 10/20/2012, 01/03/2016   Pneumococcal Polysaccharide-23 02/04/2010, 10/20/2012   Tdap 01/11/2013    TDAP status: Up to date  Flu Vaccine status: Declined, Education has been provided regarding the importance of this vaccine but patient still declined. Advised may receive this vaccine at local pharmacy or Health Dept. Aware to provide a copy of the vaccination record if obtained from local pharmacy or Health Dept. Verbalized acceptance and understanding.  Pneumococcal vaccine status: Declined,  Education has been provided regarding the importance of this vaccine but patient still declined. Advised may receive this vaccine at local pharmacy or Health Dept. Aware to provide a copy of the vaccination record if obtained from local pharmacy or Health Dept. Verbalized acceptance and understanding.   Covid-19 vaccine status: Declined, Education has been provided regarding the  importance of this vaccine but patient still declined. Advised may receive this vaccine at local pharmacy or Health Dept.or vaccine clinic. Aware to provide a copy of the vaccination record if obtained from local pharmacy or Health Dept. Verbalized acceptance and understanding.  Qualifies for Shingles Vaccine? No   Zostavax completed No   Shingrix Completed?: No.    Education has been provided regarding the importance of this vaccine. Patient has been advised to call insurance company to determine out of pocket expense if they have not yet received this vaccine. Advised may also receive vaccine at local pharmacy or Health Dept. Verbalized acceptance and understanding.  Screening Tests Health Maintenance  Topic Date Due   COVID-19 Vaccine (1) Never done   Hepatitis C Screening  Never done   PAP SMEAR-Modifier  Never done   OPHTHALMOLOGY EXAM  04/06/2017   FOOT EXAM  06/05/2017   INFLUENZA VACCINE  01/16/2022 (Originally 05/19/2021)   HEMOGLOBIN A1C  12/29/2021   TETANUS/TDAP  06/05/2026   HIV Screening  Completed   HPV VACCINES  Aged Out    Health Maintenance  Health Maintenance Due  Topic Date Due   COVID-19 Vaccine (1) Never done   Hepatitis C Screening  Never done   PAP SMEAR-Modifier  Never done   OPHTHALMOLOGY EXAM  04/06/2017   FOOT EXAM  06/05/2017    Colorectal cancer screening: No longer required.   Mammogram status: No longer required due to age.  Bone Density status: Ordered n/a. Pt provided with contact info and advised to call to schedule appt.  Lung Cancer Screening: (Low Dose CT Chest recommended if Age 59-80 years, 30 pack-year currently smoking OR have quit w/in 15years.) does not qualify.   Lung Cancer Screening Referral: None  Additional Screening:  Hepatitis C Screening: does not qualify; Completed   Vision Screening: Recommended annual ophthalmology exams for early detection of glaucoma and other disorders of the eye. Is the patient up to date with  their annual eye exam?  No  Who is the provider or what is the name of the office in which the patient attends annual eye  exams? N/A If pt is not established with a provider, would they like to be referred to a provider to establish care?  Patient is established with a PCP .   Dental Screening: Recommended annual dental exams for proper oral hygiene  Community Resource Referral / Chronic Care Management: CRR required this visit?  No   CCM required this visit?  No      Plan:     I have personally reviewed and noted the following in the patients chart:   Medical and social history Use of alcohol, tobacco or illicit drugs  Current medications and supplements including opioid prescriptions.  Functional ability and status Nutritional status Physical activity Advanced directives List of other physicians Hospitalizations, surgeries, and ER visits in previous 12 months Vitals Screenings to include cognitive, depression, and falls Referrals and appointments  In addition, I have reviewed and discussed with patient certain preventive protocols, quality metrics, and best practice recommendations. A written personalized care plan for preventive services as well as general preventive health recommendations were provided to patient.     Shirley Muscat, [place]   12/18/2021   Nurse Notes:

## 2021-12-18 NOTE — Patient Instructions (Signed)
Nutrition Risk Assessment: ? ?Has the patient had any N/V/D within the last 2 months?  No  ?Does the patient have any non-healing wounds?  No  ?Has the patient had any unintentional weight loss or weight gain?  No  ? ?Diabetes: ? ?Is the patient diabetic?  Yes  ?If diabetic, was a CBG obtained today?  No  ?Did the patient bring in their glucometer from home?   Patient visit was via telephone ?How often do you monitor your CBG's? Every 3 to 6 months.  ? ?Financial Strains and Diabetes Management: ? ?Are you having any financial strains with the device, your supplies or your medication? No .  ?Does the patient want to be seen by Chronic Care Management for management of their diabetes?  No  ?Would the patient like to be referred to a Nutritionist or for Diabetic Management?  Yes  ? ?Diabetic Exams: ? ?Diabetic Eye Exam: Overdue for diabetic eye exam. Pt has been advised about the importance in completing this exam. Patient advised to call and schedule an eye exam. ?Diabetic Foot Exam: Overdue, Pt has been advised about the importance in completing this exam. Pt is scheduled for diabetic foot exam on unknown.  ?

## 2022-01-16 ENCOUNTER — Ambulatory Visit (INDEPENDENT_AMBULATORY_CARE_PROVIDER_SITE_OTHER): Payer: Medicare Other | Admitting: Infectious Diseases

## 2022-01-16 ENCOUNTER — Other Ambulatory Visit: Payer: Self-pay

## 2022-01-16 ENCOUNTER — Encounter: Payer: Self-pay | Admitting: Infectious Diseases

## 2022-01-16 VITALS — BP 162/91 | HR 92 | Temp 98.1°F | Ht 60.0 in | Wt 277.0 lb

## 2022-01-16 DIAGNOSIS — R197 Diarrhea, unspecified: Secondary | ICD-10-CM | POA: Insufficient documentation

## 2022-01-16 DIAGNOSIS — B20 Human immunodeficiency virus [HIV] disease: Secondary | ICD-10-CM

## 2022-01-16 DIAGNOSIS — I10 Essential (primary) hypertension: Secondary | ICD-10-CM | POA: Diagnosis not present

## 2022-01-16 DIAGNOSIS — J302 Other seasonal allergic rhinitis: Secondary | ICD-10-CM | POA: Diagnosis not present

## 2022-01-16 DIAGNOSIS — N912 Amenorrhea, unspecified: Secondary | ICD-10-CM | POA: Diagnosis not present

## 2022-01-16 DIAGNOSIS — A539 Syphilis, unspecified: Secondary | ICD-10-CM | POA: Diagnosis not present

## 2022-01-16 MED ORDER — MECLIZINE HCL 25 MG PO TABS: 25.0000 mg | ORAL_TABLET | Freq: Three times a day (TID) | ORAL | 0 refills | Status: DC | PRN

## 2022-01-16 MED ORDER — SPIRONOLACTONE 25 MG PO TABS: ORAL_TABLET | ORAL | 0 refills | Status: DC

## 2022-01-16 NOTE — Assessment & Plan Note (Signed)
She is s/p BTL many years back. Will check TSH today. DM is better controlled @ 8% on current regimen. No vasomotor symptoms.  ?

## 2022-01-16 NOTE — Assessment & Plan Note (Signed)
Repeat titer today to ensure adequate treatment response ?

## 2022-01-16 NOTE — Progress Notes (Signed)
? ?Name: Sherry Hall  ?MCN:[id number]   ?DOB: 29-Nov-1980   ?PCP: Dorena Dew, FNP  ? ? ?Brief History ID: ?Vana Franta is a 41 y.o. female with HIV disease. Dx 2011. Intermittently on medications. Co morbidities: T1DM, HTN, Hyperlipidemia, obesity. ?HIV Risk: heterosexual ?Hx: OIs: none known ? ?Hep B sAg (not on file); Hep BsAb (neg); Hep B cAb (not on file); Hep C (not on file); Hep A (not on file); HLA B*5701 (-); Quantiferon (-) ? ?Previous Regimens: ?Complera ?Odefsey ?Biktarvy  ? ? ?Genotypes: ?2017 - no significant mutations PR/RT ? ? ?Chief Complaint  ?Patient presents with  ? Follow-up  ?  ? ? ?HPI:  ?She is doing well. Continues to get her biktarvy in everyday and does not like to miss this. She has access to her medications and gets them filled without any problems.  ? ?Has a few concerns today - has possible upper respiratory infection. Sore throat, sneezing, dizziness, head pressure, nasal congestion, and fluid in her ears for about a month now. No fevers, chills, facial pain, headaches, sweating. No drainage anywhere. No one at home sick - her eldest was taken to the doctor for similar symptoms but all viral testing negative. ? ?She has a lot of diarrhea - happens about once a week where she has  ?Feels like it is painful / uncomfortable at times. Has noticed this since September and wonders if it was related to previous syphilis infection.  ? ?Cycle has been irregular - she had BTL and no chance of pregnancy. History of heavy menstrual bleeding and clots when they do come.  ? ? ?Review of Systems  ?All other systems reviewed and are negative.  ? ? ? ?Observations/Objective: ?Today's Vitals  ? 01/16/22 1448 01/16/22 1453  ?BP: (!) 162/91   ?Pulse: 92   ?Temp: 98.1 ?F (36.7 ?C)   ?TempSrc: Oral   ?SpO2: 99%   ?Weight: 277 lb (125.6 kg)   ?Height: 5' (1.524 m)   ?PainSc: 0-No pain 0-No pain  ? ? ?Body mass index is 54.1 kg/m?. ? ? ?Physical Exam ?Constitutional:   ?   Appearance:  Normal appearance. She is not ill-appearing.  ?HENT:  ?   Mouth/Throat:  ?   Mouth: Mucous membranes are moist.  ?   Pharynx: Oropharynx is clear.  ?Eyes:  ?   General: No scleral icterus. ?Pulmonary:  ?   Effort: Pulmonary effort is normal.  ?Neurological:  ?   Mental Status: She is oriented to person, place, and time.  ?Psychiatric:     ?   Mood and Affect: Mood normal.     ?   Thought Content: Thought content normal.  ? ? ? ?HIV 1 RNA Quant  ?Date Value  ?08/11/2021 38 Copies/mL (H)  ?01/03/2021 <20 Copies/mL (H)  ?12/13/2020 3,510 copies/mL  ? ?CD4 T Cell Abs (/uL)  ?Date Value  ?12/12/2020 570  ?08/10/2019 535  ?12/26/2018 390 (L)  ? ? ?Lab Results  ?Component Value Date  ? CREATININE 0.90 07/18/2021  ? CREATININE 0.89 07/18/2021  ? CREATININE 0.89 05/13/2021  ? ? ?Lab Results  ?Component Value Date  ? WBC 5.8 07/18/2021  ? HGB 12.4 07/18/2021  ? HCT 36.3 07/18/2021  ? MCV 95 07/18/2021  ? PLT 200 07/18/2021  ? ? ?Lab Results  ?Component Value Date  ? ALT 14 07/18/2021  ? AST 11 07/18/2021  ? ALKPHOS 82 07/18/2021  ? BILITOT 0.2 07/18/2021  ? ? ? ?Assessment and Plan: ? ?Problem  List Items Addressed This Visit   ? ?  ? High  ? HIV disease ([place]) - Primary (Chronic)  ?  Well controlled on biktarvy once daily with VL < 50 and healthy recovery of CD4. Will repeat pertinent labs today to update for her.  ?Will need to get her scheduled for cervical cancer screening - Offered we can do that for her here and she will schedule an appointment within the next 3 months.  ?  ?  ? Relevant Orders  ? HIV-1 RNA quant-no reflex-bld  ? COMPLETE METABOLIC PANEL WITH GFR  ? T-helper cells (CD4) count (not at [facility])  ? HTN (hypertension) (Chronic)  ?  BP Readings from Last 3 Encounters:  ?01/16/22 (!) 162/91  ?08/11/21 (!) 167/94  ?07/22/21 (!) 152/85  ?BPs still out of range here in the office. She needs a refill of her spironolactone 12.5 mg QD --> will refill x 33mand have her follow up with her cardiology team for further  titration to keep her closer to 120/80.  ?CMP today to assess glucose, creatinine and potassium level.  ?  ?  ? Relevant Medications  ? spironolactone (ALDACTONE) 25 MG tablet  ?  ? Unprioritized  ? Amenorrhea  ?  She is s/p BTL many years back. Will check TSH today. DM is better controlled @ 8% on current regimen. No vasomotor symptoms.  ?  ?  ? Relevant Orders  ? TSH + free T4  ? Diarrhea  ?  Has had weekly episodes of diarrhea since September. Some diffuse abdominal discomfort/bloating. No fevers, mucus or blood described. Unclear if related to medication side effect vs IBS symptoms. I doubt related to infection. Will see if she can keep a diary of symptoms to see if there is any triggers she can identify. Will see if pharmacy team can review her medications for any offending drugs that could be contributing. Biktarvy can be a/w this but I think unlikely given this started relatively recently.  ?Will trial imodium. Consider adding probiotic / fiber to help.  ?  ?  ? Seasonal allergies  ?  On exam she has middle ear effusions R>L. Neither appear infected at this time. We discussed symptomatic care with OTC flonase, Rx or meclizine TID given, benadryl PRN at night, humidifier in room, saline rinses, limiting allergy exposures.  ?May need singulair rx.  ?  ?  ? Syphilis  ?  Repeat titer today to ensure adequate treatment response ?  ?  ? Relevant Orders  ? RPR  ? ?SJanene Madeira MSN, NP-C ?RNelsonfor Infectious Disease ?[facility]  [email or web address] ?Pager: [phone]?Office: [phone]?[facility] Main Line: [phone] ? ? ? ?

## 2022-01-16 NOTE — Assessment & Plan Note (Addendum)
BP Readings from Last 3 Encounters:  ?01/16/22 (!) 162/91  ?08/11/21 (!) 167/94  ?07/22/21 (!) 152/85  ? ?BPs still out of range here in the office. She needs a refill of her spironolactone 12.5 mg QD --> will refill x 59m and have her follow up with her cardiology team for further titration to keep her closer to 120/80.  ?CMP today to assess glucose, creatinine and potassium level.  ?

## 2022-01-16 NOTE — Assessment & Plan Note (Signed)
On exam she has middle ear effusions R>L. Neither appear infected at this time. We discussed symptomatic care with OTC flonase, Rx or meclizine TID given, benadryl PRN at night, humidifier in room, saline rinses, limiting allergy exposures.  ?May need singulair rx.  ?

## 2022-01-16 NOTE — Patient Instructions (Addendum)
Nice to see you! ? ?Please call you cardiologist to schedule a follow up.  ? ?I think you symptoms are all due to allergies -  ? ?Flonase - over the counter nasal spray  ?Meclizine as needed during the day - can do benadry 1-2 caps at night.  ?Humidifier in the bedroom ?Saline spray to the nose or thin layer of vaseline ? ?Imodium - try one pill to see how this helps on the day you have diarrhea to help the symptoms. I want you to try to track down what may be a trigger (diet related, stress related, could be a medication side effect)  ? ?Schedule a follow up with me sometime in 1-3 months for a pap smear.  ?

## 2022-01-16 NOTE — Assessment & Plan Note (Addendum)
Well controlled on biktarvy once daily with VL < 50 and healthy recovery of CD4. Will repeat pertinent labs today to update for her.  ?Will need to get her scheduled for cervical cancer screening - Offered we can do that for her here and she will schedule an appointment within the next 3 months.  ?

## 2022-01-16 NOTE — Assessment & Plan Note (Signed)
Has had weekly episodes of diarrhea since September. Some diffuse abdominal discomfort/bloating. No fevers, mucus or blood described. Unclear if related to medication side effect vs IBS symptoms. I doubt related to infection. Will see if she can keep a diary of symptoms to see if there is any triggers she can identify. Will see if pharmacy team can review her medications for any offending drugs that could be contributing. Biktarvy can be a/w this but I think unlikely given this started relatively recently.  ?Will trial imodium. Consider adding probiotic / fiber to help.  ?

## 2022-01-19 LAB — COMPLETE METABOLIC PANEL WITH GFR
AG Ratio: 0.9 (calc) — ABNORMAL LOW (ref 1.0–2.5)
ALT: 12 U/L (ref 6–29)
AST: 10 U/L (ref 10–30)
Albumin: 3.2 g/dL — ABNORMAL LOW (ref 3.6–5.1)
Alkaline phosphatase (APISO): 75 U/L (ref 31–125)
BUN/Creatinine Ratio: 14 (calc) (ref 6–22)
BUN: 15 mg/dL (ref 7–25)
CO2: 26 mmol/L (ref 20–32)
Calcium: 8.3 mg/dL — ABNORMAL LOW (ref 8.6–10.2)
Chloride: 104 mmol/L (ref 98–110)
Creat: 1.06 mg/dL — ABNORMAL HIGH (ref 0.50–0.99)
Globulin: 3.6 g/dL (calc) (ref 1.9–3.7)
Glucose, Bld: 274 mg/dL — ABNORMAL HIGH (ref 65–99)
Potassium: 3.9 mmol/L (ref 3.5–5.3)
Sodium: 138 mmol/L (ref 135–146)
Total Bilirubin: 0.3 mg/dL (ref 0.2–1.2)
Total Protein: 6.8 g/dL (ref 6.1–8.1)
eGFR: 67 mL/min/{1.73_m2} (ref >=60)

## 2022-01-19 LAB — T-HELPER CELLS (CD4) COUNT (NOT AT ARMC)
Absolute CD4: 667 cells/uL (ref 490–1740)
CD4 T Helper %: 32 % (ref 30–61)
Total lymphocyte count: 2077 cells/uL (ref 850–3900)

## 2022-01-19 LAB — HIV-1 RNA QUANT-NO REFLEX-BLD
HIV 1 RNA Quant: 33 Copies/mL — ABNORMAL HIGH
HIV-1 RNA Quant, Log: 1.52 Log cps/mL — ABNORMAL HIGH

## 2022-01-19 LAB — RPR: RPR Ser Ql: NONREACTIVE

## 2022-01-19 LAB — TSH+FREE T4: TSH W/REFLEX TO FT4: 0.52 mIU/L

## 2022-02-24 NOTE — Progress Notes (Deleted)
Cardiology Office Note:   Date:  02/24/2022  NAME:  Sherry Hall    MRN: [id number] DOB:  1980/12/26   PCP:  Massie Maroon, FNP  Cardiologist:  Reatha Harps, MD  Electrophysiologist:  None   Referring MD: Massie Maroon, FNP   No chief complaint on file. ***  History of Present Illness:   Sherry Hall is a 41 y.o. female with a hx of systolic HF, DM, HTN who presents for follow-up.   Problem List 1. Systolic HF -35-40% 12/15/2020 -EF 45-50% 05/06/2021 -0 CAC on CT PE 12/11/2020 -dx in setting of uncontrolled HTN 2. Diabetes -A1c 8.2 3. Obesity -BMI 52 4. HLD -T chol 235, HDL 51, LDL 147, TG 230 5. HTN 6. OSA -mild  Past Medical History: Past Medical History:  Diagnosis Date   Anemia    H/O   Anxiety    Depression    Diabetes mellitus type 1 (HCC)    GERD (gastroesophageal reflux disease)    HIV (human immunodeficiency virus infection) (HCC) 2011   Hypertension    Type 2 diabetes mellitus [facility])     Past Surgical History: Past Surgical History:  Procedure Laterality Date   CESAREAN SECTION  2001, 2008, 2009, 2011   CHOLECYSTECTOMY     EXCISION OF BREAST LESION N/A 01/04/2018   Procedure: EXCISION OF CHEST WALL ABSCESS. MIDDLE CHEST;  Surgeon: Henrene Dodge, MD;  Location: [facility] ORS;  Service: General;  Laterality: N/A;  resection of chest wall cyst / abscess   FRACTURE SURGERY Right    HAND   TONSILLECTOMY     TUBAL LIGATION  2011    Current Medications: No outpatient medications have been marked as taking for the 02/25/22 encounter (Appointment) with Sande Rives, MD.     Allergies:    Percocet [oxycodone-acetaminophen] and Latex   Social History: Social History   Socioeconomic History   Marital status: Single    Spouse name: Not on file   Number of children: Not on file   Years of education: Not on file   Highest education level: Not on file  Occupational History   Not on file  Tobacco Use   Smoking status:  Every Day    Packs/day: 1.00    Years: 15.00    Pack years: 15.00    Types: Cigarettes   Smokeless tobacco: Never   Tobacco comments:    trying to slow  Vaping Use   Vaping Use: Never used  Substance and Sexual Activity   Alcohol use: Yes    Alcohol/week: 1.0 standard drink    Types: 1 Standard drinks or equivalent per week    Comment: socially   Drug use: No   Sexual activity: Yes    Partners: Male    Birth control/protection: Surgical    Comment: declined condoms  Other Topics Concern   Not on file  Social History Narrative   Not on file   Social Determinants of Health   Financial Resource Strain: Low Risk    Difficulty of Paying Living Expenses: Not very hard  Food Insecurity: No Food Insecurity   Worried About Programme researcher, broadcasting/film/video in the Last Year: Never true   Ran Out of Food in the Last Year: Never true  Transportation Needs: No Transportation Needs   Lack of Transportation (Medical): No   Lack of Transportation (Non-Medical): No  Physical Activity: Inactive   Days of Exercise per Week: 0 days   Minutes of Exercise per Session: 0 min  Stress: Stress Concern Present   Feeling of Stress : Very much  Social Connections: Moderately Isolated   Frequency of Communication with Friends and Family: More than three times a week   Frequency of Social Gatherings with Friends and Family: Never   Attends Religious Services: Never   Database administrator or Organizations: No   Attends Engineer, structural: Never   Marital Status: Living with partner     Family History: The patient's ***family history includes Cancer in her mother; Diabetes in her father; Hypertension in her father.  ROS:   All other ROS reviewed and negative. Pertinent positives noted in the HPI.     EKGs/Labs/Other Studies Reviewed:   The following studies were personally reviewed by me today:  EKG:  EKG is *** ordered today.  The ekg ordered today demonstrates ***, and was personally  reviewed by me.   Recent Labs: 07/18/2021: Hemoglobin 12.4; Platelets 200 01/16/2022: ALT 12; BUN 15; Creat 1.06; Potassium 3.9; Sodium 138   Recent Lipid Panel    Component Value Date/Time   CHOL 235 (H) 12/26/2018 1209   TRIG 230 (H) 12/26/2018 1209   HDL 51 12/26/2018 1209   CHOLHDL 4.6 12/26/2018 1209   VLDL 23 11/23/2016 1045   LDLCALC 147 (H) 12/26/2018 1209    Physical Exam:   VS:  There were no vitals taken for this visit.   Wt Readings from Last 3 Encounters:  01/16/22 277 lb (125.6 kg)  08/11/21 273 lb (123.8 kg)  07/22/21 273 lb 0.8 oz (123.9 kg)    General: Well nourished, well developed, in no acute distress Head: Atraumatic, normal size  Eyes: PEERLA, EOMI  Neck: Supple, no JVD Endocrine: No thryomegaly Cardiac: Normal S1, S2; RRR; no murmurs, rubs, or gallops Lungs: Clear to auscultation bilaterally, no wheezing, rhonchi or rales  Abd: Soft, nontender, no hepatomegaly  Ext: No edema, pulses 2+ Musculoskeletal: No deformities, BUE and BLE strength normal and equal Skin: Warm and dry, no rashes   Neuro: Alert and oriented to person, place, time, and situation, CNII-XII grossly intact, no focal deficits  Psych: Normal mood and affect   ASSESSMENT:   Sherry Hall is a 41 y.o. female who presents for the following: No diagnosis found.  PLAN:   There are no diagnoses linked to this encounter.  {Are you ordering a CV Procedure (e.g. stress test, cath, DCCV, TEE, etc)?   Press F2        :[id number]}  Disposition: No follow-ups on file.  Medication Adjustments/Labs and Tests Ordered: Current medicines are reviewed at length with the patient today.  Concerns regarding medicines are outlined above.  No orders of the defined types were placed in this encounter.  No orders of the defined types were placed in this encounter.   There are no Patient Instructions on file for this visit.   Time Spent with Patient: I have spent a total of *** minutes with  patient reviewing hospital notes, telemetry, EKGs, labs and examining the patient as well as establishing an assessment and plan that was discussed with the patient.  > 50% of time was spent in direct patient care.  Signed, Sherry Hall. Flora Lipps, MD, [facility]  [facility]  [address] [place], [place] [address] [phone]  02/24/2022 9:34 PM

## 2022-02-25 ENCOUNTER — Ambulatory Visit: Payer: Medicare Other | Admitting: Cardiovascular Disease

## 2022-02-25 ENCOUNTER — Other Ambulatory Visit: Payer: Self-pay | Admitting: Infectious Diseases

## 2022-02-25 ENCOUNTER — Other Ambulatory Visit: Payer: Self-pay | Admitting: Cardiovascular Disease

## 2022-02-25 DIAGNOSIS — B20 Human immunodeficiency virus [HIV] disease: Secondary | ICD-10-CM

## 2022-02-25 DIAGNOSIS — I5022 Chronic systolic (congestive) heart failure: Secondary | ICD-10-CM

## 2022-03-03 ENCOUNTER — Encounter: Payer: Self-pay | Admitting: Cardiovascular Disease

## 2022-03-17 ENCOUNTER — Other Ambulatory Visit: Payer: Self-pay

## 2022-03-17 ENCOUNTER — Ambulatory Visit (INDEPENDENT_AMBULATORY_CARE_PROVIDER_SITE_OTHER): Payer: Medicare Other | Admitting: Infectious Diseases

## 2022-03-17 ENCOUNTER — Encounter: Payer: Self-pay | Admitting: Infectious Diseases

## 2022-03-17 ENCOUNTER — Other Ambulatory Visit (HOSPITAL_COMMUNITY)
Admission: RE | Admit: 2022-03-17 | Discharge: 2022-03-17 | Disposition: A | Discharge status: Home / Self Care | Payer: Medicare Other | Source: Ambulatory Visit | Attending: Infectious Diseases | Admitting: Infectious Diseases

## 2022-03-17 VITALS — BP 128/83 | HR 91 | Temp 98.2°F | Ht 60.0 in

## 2022-03-17 DIAGNOSIS — Z124 Encounter for screening for malignant neoplasm of cervix: Secondary | ICD-10-CM | POA: Diagnosis present

## 2022-03-17 NOTE — Progress Notes (Signed)
      Subjective :    Sherry Hall is a 41 y.o. female here for cervical cancer screening.    Review of Systems: Current GYN complaints or concerns: none. Last pap smear was about 11 years ago. Does not recall any abnormal tests but has not had one in some time. Patient denies any abdominal/pelvic pain, problems with bowel movements, urination, vaginal discharge or intercourse.  Her mother passed from cervical cancer. No breast cancer history to her recollection.  Has had increased bleeding and pain with some of her menstrual cycles recently. LMP May 4  Not planning any more children and s/p BTL.    Past Medical History:  Diagnosis Date   Anemia    H/O   Anxiety    Depression    Diabetes mellitus type 1 (HCC)    GERD (gastroesophageal reflux disease)    HIV (human immunodeficiency virus infection) (HCC) 2011   Hypertension    Type 2 diabetes mellitus (HCC)     Gynecologic History: G5P0  Patient's last menstrual period was 02/19/2022 (exact date). Contraception: condoms Last Pap: > 5 years ago. Results were: normal (patient reported) Last Mammogram: never. Results were:     Objective :   Physical Exam - chaperone present  Constitutional: Well developed, well nourished, no acute distress. She is alert and oriented x3.  Pelvic: External genitalia is normal in appearance. The vagina is normal in appearance. The cervix is bulbous and easily visualized. No CMT, normal expected cervical mucus present. Bimanual exam reveals uterus that is felt to be normal size, shape, and contour. No adnexal masses or tenderness noted. Limited with body habitus.   Assessment & Plan:    Patient Active Problem List   Diagnosis Date Noted   Type 1 diabetes mellitus with hyperglycemia (HCC) 01/03/2016   HTN (hypertension) 09/16/2015   HIV disease (HCC) 09/16/2015   Obesity 03/21/2012   Screening for cervical cancer 03/17/2022   Amenorrhea 01/16/2022   Diarrhea 01/16/2022   Seasonal  allergies 01/16/2022   Syphilis 08/11/2021   Chronic systolic congestive heart failure (HCC) 01/03/2021   Bilateral hand numbness 01/03/2021   History of COVID-19 12/12/2020   Dyspnea 12/11/2020   Tobacco dependence 10/20/2016   Depression 09/16/2015   Numbness and tingling of both legs below knees 06/05/2015   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  Cervical brushing collected for cytology and HPV.  Discussed recommended screening interval for women living with HIV disease meant to be lifelong and at an interval of Q1-3 years pending results. Acceptable to space out Q3y with 3 consecutively normal exams. Further recommendations for Sherry Hall to follow today's results.  Results will be communicated to the patient via mychart.        Relevant Orders   Cytology - PAP( [place])    If she has ongoing trouble with bleeding can refer to gyn. She will let me know.    Rexene Alberts, MSN, NP-C [facility] Office: [phone] Pager: [phone]  03/17/22 10:37 AM

## 2022-03-17 NOTE — Patient Instructions (Addendum)
Would recommend doing your first mammogram by the age of 63.   Will let you know what today's test results show via your mychart.   Will see you back in 6 months for routine care with me   If you want to see the gynecology team about the bleeding let me know and I can put in a referral.

## 2022-03-17 NOTE — Assessment & Plan Note (Signed)
Normal pelvic exam.  Cervical brushing collected for cytology and HPV.  Discussed recommended screening interval for women living with HIV disease meant to be lifelong and at an interval of Q1-3 years pending results. Acceptable to space out Q3y with 3 consecutively normal exams. Further recommendations for Sherry Hall to follow today's results.  Results will be communicated to the patient via mychart.

## 2022-03-26 ENCOUNTER — Emergency Department (HOSPITAL_BASED_OUTPATIENT_CLINIC_OR_DEPARTMENT_OTHER)
Admission: EM | Admit: 2022-03-26 | Discharge: 2022-03-27 | Disposition: A | Discharge status: Home / Self Care | Payer: Medicare Other | Attending: Emergency Medicine | Admitting: Emergency Medicine

## 2022-03-26 ENCOUNTER — Emergency Department (HOSPITAL_BASED_OUTPATIENT_CLINIC_OR_DEPARTMENT_OTHER): Payer: Medicare Other | Admitting: Radiology

## 2022-03-26 ENCOUNTER — Encounter (HOSPITAL_BASED_OUTPATIENT_CLINIC_OR_DEPARTMENT_OTHER): Payer: Self-pay

## 2022-03-26 ENCOUNTER — Other Ambulatory Visit: Payer: Self-pay

## 2022-03-26 DIAGNOSIS — E109 Type 1 diabetes mellitus without complications: Secondary | ICD-10-CM | POA: Insufficient documentation

## 2022-03-26 DIAGNOSIS — Z21 Asymptomatic human immunodeficiency virus [HIV] infection status: Secondary | ICD-10-CM | POA: Insufficient documentation

## 2022-03-26 DIAGNOSIS — F17210 Nicotine dependence, cigarettes, uncomplicated: Secondary | ICD-10-CM | POA: Insufficient documentation

## 2022-03-26 DIAGNOSIS — I509 Heart failure, unspecified: Secondary | ICD-10-CM | POA: Diagnosis not present

## 2022-03-26 DIAGNOSIS — R059 Cough, unspecified: Secondary | ICD-10-CM | POA: Diagnosis present

## 2022-03-26 DIAGNOSIS — I110 Hypertensive heart disease with heart failure: Secondary | ICD-10-CM | POA: Diagnosis not present

## 2022-03-26 DIAGNOSIS — J189 Pneumonia, unspecified organism: Secondary | ICD-10-CM | POA: Diagnosis not present

## 2022-03-26 LAB — TROPONIN I (HIGH SENSITIVITY): Troponin I (High Sensitivity): 6 ng/L (ref <18)

## 2022-03-26 LAB — CBC
HCT: 39.4 % (ref 36.0–46.0)
Hemoglobin: 13.5 g/dL (ref 12.0–15.0)
MCH: 32.2 pg (ref 26.0–34.0)
MCHC: 34.3 g/dL (ref 30.0–36.0)
MCV: 94 fL (ref 80.0–100.0)
Platelets: 248 10*3/uL (ref 150–400)
RBC: 4.19 MIL/uL (ref 3.87–5.11)
RDW: 12.5 % (ref 11.5–15.5)
WBC: 10.5 10*3/uL (ref 4.0–10.5)
nRBC: 0 % (ref 0.0–0.2)

## 2022-03-26 LAB — BASIC METABOLIC PANEL
Anion gap: 14 (ref 5–15)
BUN: 14 mg/dL (ref 6–20)
CO2: 19 mmol/L — ABNORMAL LOW (ref 22–32)
Calcium: 9.9 mg/dL (ref 8.9–10.3)
Chloride: 101 mmol/L (ref 98–111)
Creatinine, Ser: 1.08 mg/dL — ABNORMAL HIGH (ref 0.44–1.00)
GFR, Estimated: >60 mL/min (ref >60)
Glucose, Bld: 239 mg/dL — ABNORMAL HIGH (ref 70–99)
Potassium: 4 mmol/L (ref 3.5–5.1)
Sodium: 134 mmol/L — ABNORMAL LOW (ref 135–145)

## 2022-03-26 LAB — PREGNANCY, URINE: Preg Test, Ur: NEGATIVE

## 2022-03-26 NOTE — ED Triage Notes (Signed)
Pt arrives POV with complaints of centralized cheat pain, shob, cough, and n/v that started yesterday. Pt states that she took an extra lasix pill yesterday after she noticed that she was shob. Pt presents with cough and shob, no leg swelling noted.

## 2022-03-27 DIAGNOSIS — J189 Pneumonia, unspecified organism: Secondary | ICD-10-CM | POA: Diagnosis not present

## 2022-03-27 LAB — CYTOLOGY - PAP
Adequacy: ABNORMAL
Bacterial Vaginitis (gardnerella): NEGATIVE
Chlamydia: NEGATIVE
Comment: NEGATIVE
Comment: NEGATIVE
Comment: NEGATIVE
Comment: NORMAL
Neisseria Gonorrhea: NEGATIVE
Trichomonas: NEGATIVE

## 2022-03-27 LAB — TROPONIN I (HIGH SENSITIVITY): Troponin I (High Sensitivity): 6 ng/L (ref <18)

## 2022-03-27 MED ORDER — AMOXICILLIN 500 MG PO CAPS: 500.0000 mg | ORAL_CAPSULE | Freq: Three times a day (TID) | ORAL | 0 refills | Status: AC

## 2022-03-27 MED ORDER — AMOXICILLIN 500 MG PO CAPS
500.0000 mg | ORAL_CAPSULE | Freq: Once | ORAL | Status: AC
  Administered 2022-03-27: 500 mg via ORAL
  Filled 2022-03-27: qty 1

## 2022-03-27 MED ORDER — DEXAMETHASONE SODIUM PHOSPHATE 10 MG/ML IJ SOLN
10.0000 mg | Freq: Once | INTRAMUSCULAR | Status: AC
  Administered 2022-03-27: 10 mg via INTRAVENOUS
  Filled 2022-03-27: qty 1

## 2022-03-27 MED ORDER — FLUCONAZOLE 200 MG PO TABS: 200.0000 mg | ORAL_TABLET | ORAL | 0 refills | Status: AC | PRN

## 2022-03-27 MED ORDER — BENZONATATE 100 MG PO CAPS: 100.0000 mg | ORAL_CAPSULE | Freq: Three times a day (TID) | ORAL | 0 refills | Status: DC

## 2022-03-27 MED ORDER — AZITHROMYCIN 250 MG PO TABS: 250.0000 mg | ORAL_TABLET | Freq: Every day | ORAL | 0 refills | Status: DC

## 2022-03-27 MED ORDER — AZITHROMYCIN 250 MG PO TABS
500.0000 mg | ORAL_TABLET | Freq: Once | ORAL | Status: AC
  Administered 2022-03-27: 500 mg via ORAL
  Filled 2022-03-27: qty 2

## 2022-03-27 NOTE — Discharge Instructions (Signed)
You were evaluated in the Emergency Department and after careful evaluation, we did not find any emergent condition requiring admission or further testing in the hospital.  Your exam/testing today is overall reassuring.  Symptoms may be due to pneumonia.  Take the amoxicillin and azithromycin antibiotics as directed.  Can use the Tessalon medication as needed for cough.  Please return to the Emergency Department if you experience any worsening of your condition.   Thank you for allowing [place] to be a part of your care.

## 2022-03-27 NOTE — ED Provider Notes (Signed)
[facility] Emergency Department Provider Note MRN:  [id number]  Arrival date & time: 03/27/22     Chief Complaint   Cough History of Present Illness   Sherry Hall is a 41 y.o. year-old female with a history of diabetes, HIV, a CHF presenting to the ED with chief complaint of cough.  Cough and nasal congestion for the past 3 days, progressing to persistent relentless cough, dyspnea on exertion today.  Feeling sore in the chest with cough.  Denies leg pain or swelling, no abdominal pain, no other complaint.  Review of Systems  A thorough review of systems was obtained and all systems are negative except as noted in the HPI and PMH.   Patient's Health History    Past Medical History:  Diagnosis Date   Anemia    H/O   Anxiety    Depression    Diabetes mellitus type 1 (Sherry Hall)    GERD (gastroesophageal reflux disease)    HIV (human immunodeficiency virus infection) ([place]) 2011   Hypertension    Type 2 diabetes mellitus [facility])     Past Surgical History:  Procedure Laterality Date   CESAREAN SECTION  2001, 2008, 2009, 2011   CHOLECYSTECTOMY     EXCISION OF BREAST LESION N/A 01/04/2018   Procedure: EXCISION OF CHEST WALL ABSCESS. MIDDLE CHEST;  Surgeon: Olean Ree, MD;  Location: [facility] ORS;  Service: General;  Laterality: N/A;  resection of chest wall cyst / abscess   FRACTURE SURGERY Right    HAND   TONSILLECTOMY     TUBAL LIGATION  2011    Family History  Problem Relation Age of Onset   Diabetes Father    Hypertension Father    Cancer Mother        cervical     Social History   Socioeconomic History   Marital status: Single    Spouse name: Not on file   Number of children: Not on file   Years of education: Not on file   Highest education level: Not on file  Occupational History   Not on file  Tobacco Use   Smoking status: Every Day    Packs/day: 0.50    Years: 15.00    Total pack years: 7.50    Types: Cigarettes   Smokeless  tobacco: Never   Tobacco comments:    trying to slow, cutting back  Vaping Use   Vaping Use: Never used  Substance and Sexual Activity   Alcohol use: Yes    Alcohol/week: 1.0 standard drink of alcohol    Types: 1 Standard drinks or equivalent per week    Comment: socially   Drug use: No   Sexual activity: Yes    Partners: Male    Birth control/protection: Surgical    Comment: declined condoms  Other Topics Concern   Not on file  Social History Narrative   Not on file   Social Determinants of Health   Financial Resource Strain: Low Risk  (12/18/2021)   Overall Financial Resource Strain (CARDIA)    Difficulty of Paying Living Expenses: Not very hard  Food Insecurity: No Food Insecurity (12/18/2021)   Hunger Vital Sign    Worried About Running Out of Food in the Last Year: Never true    Ran Out of Food in the Last Year: Never true  Transportation Needs: No Transportation Needs (12/18/2021)   PRAPARE - Hydrologist (Medical): No    Lack of Transportation (Non-Medical): No  Physical  Activity: Inactive (12/18/2021)   Exercise Vital Sign    Days of Exercise per Week: 0 days    Minutes of Exercise per Session: 0 min  Stress: Stress Concern Present (12/18/2021)   Sherry Hall    Feeling of Stress : Very much  Social Connections: Moderately Isolated (12/18/2021)   Social Connection and Isolation Panel [NHANES]    Frequency of Communication with Friends and Family: More than three times a week    Frequency of Social Gatherings with Friends and Family: Never    Attends Religious Services: Never    Marine scientist or Organizations: No    Attends Archivist Meetings: Never    Marital Status: Living with partner  Intimate Partner Violence: Not At Risk (12/18/2021)   Humiliation, Afraid, Rape, and Kick questionnaire    Fear of Current or Ex-Partner: No    Emotionally Abused: No     Physically Abused: No    Sexually Abused: No     Physical Exam   Vitals:   03/27/22 0001 03/27/22 0015  BP:  (!) 156/92  Pulse:  95  Resp:  19  Temp:    SpO2: 96% 99%    CONSTITUTIONAL: Well-appearing, NAD NEURO/PSYCH:  Alert and oriented x 3, no focal deficits EYES:  eyes equal and reactive ENT/NECK:  no LAD, no JVD CARDIO: Regular rate, well-perfused, normal S1 and S2 PULM:  CTAB no wheezing or rhonchi GI/GU:  non-distended, non-tender MSK/SPINE:  No gross deformities, no edema SKIN:  no rash, atraumatic   *Additional and/or pertinent findings included in MDM below  Diagnostic and Interventional Summary    EKG Interpretation  Date/Time:  Thursday March 26 2022 21:04:53 EDT Ventricular Rate:  102 PR Interval:  158 QRS Duration: 86 QT Interval:  372 QTC Calculation: 484 R Axis:   76 Text Interpretation: Sinus tachycardia Right atrial enlargement Borderline ECG When compared with ECG of 11-Dec-2020 09:16, No significant change was found Confirmed by Gerlene Fee [phone]) on 03/27/2022 1:11:54 AM       Labs Reviewed  BASIC METABOLIC PANEL - Abnormal; Notable for the following components:      Result Value   Sodium 134 (*)    CO2 19 (*)    Glucose, Bld 239 (*)    Creatinine, Ser 1.08 (*)    All other components within normal limits  CBC  PREGNANCY, URINE  TROPONIN I (HIGH SENSITIVITY)  TROPONIN I (HIGH SENSITIVITY)    DG Chest 2 View  Final Result      Medications  dexamethasone (DECADRON) injection 10 mg (10 mg Intravenous Given 03/27/22 0013)  amoxicillin (AMOXIL) capsule 500 mg (500 mg Oral Given 03/27/22 0017)  azithromycin (ZITHROMAX) tablet 500 mg (500 mg Oral Given 03/27/22 0017)     Procedures  /  Critical Care Procedures  ED Course and Medical Decision Making  Initial Impression and Ddx Upper respiratory symptoms progressing to dyspnea on exertion, persistent cough.  Sick contact in the home who has been diagnosed with pneumonia.  Vital signs  reassuring, ambulated and maintained her saturations in the 90s.  Does have a history of CHF, HIV, diabetes, per chart review has an EF of 25 to 35%.  Does not appear fluid overloaded on exam.  Chest x-ray is without edema or obvious opacities.  Overall not in acute distress from a respiratory perspective, will treat empirically for pneumonia.  Past medical/surgical history that increases complexity of ED encounter: HIV, CHF, diabetes  Interpretation of Diagnostics I personally reviewed the Chest Xray and my interpretation is as follows: No obvious pneumothorax or lobar opacity  Labs are reassuring with no significant blood count or electrolyte disturbance, troponin is negative x2.  Patient Reassessment and Ultimate Disposition/Management     Patient is feeling better, will treat CAP as an outpatient.  Patient management required discussion with the following services or consulting groups:  None  Complexity of Problems Addressed Acute illness or injury that poses threat of life of bodily function  Additional Data Reviewed and Analyzed Further history obtained from: Recent discharge summary  Additional Factors Impacting ED Encounter Risk Prescriptions and Consideration of hospitalization  Barth Kirks. Sedonia Small, [place] [email or web address]  Final Clinical Impressions(s) / ED Diagnoses     ICD-10-CM   1. Community acquired pneumonia, unspecified laterality  J18.9       ED Discharge Orders          Ordered    amoxicillin (AMOXIL) 500 MG capsule  3 times daily        03/27/22 0110    azithromycin (ZITHROMAX) 250 MG tablet  Daily        03/27/22 0110    benzonatate (TESSALON) 100 MG capsule  Every 8 hours        03/27/22 0110             Discharge Instructions Discussed with and Provided to Patient:    Discharge Instructions      You were evaluated in the Emergency Department and after careful evaluation, we did not  find any emergent condition requiring admission or further testing in the hospital.  Your exam/testing today is overall reassuring.  Symptoms may be due to pneumonia.  Take the amoxicillin and azithromycin antibiotics as directed.  Can use the Tessalon medication as needed for cough.  Please return to the Emergency Department if you experience any worsening of your condition.   Thank you for allowing [place] to be a part of your care.      Maudie Flakes, MD 03/27/22 [phone]

## 2022-04-13 ENCOUNTER — Ambulatory Visit (INDEPENDENT_AMBULATORY_CARE_PROVIDER_SITE_OTHER): Payer: Medicare Other | Admitting: Cardiovascular Disease

## 2022-04-13 ENCOUNTER — Encounter: Payer: Self-pay | Admitting: Cardiovascular Disease

## 2022-04-13 VITALS — BP 152/70 | HR 70 | Ht 60.0 in | Wt 282.6 lb

## 2022-04-13 DIAGNOSIS — I10 Essential (primary) hypertension: Secondary | ICD-10-CM | POA: Diagnosis not present

## 2022-04-13 DIAGNOSIS — I5022 Chronic systolic (congestive) heart failure: Secondary | ICD-10-CM

## 2022-04-13 DIAGNOSIS — E782 Mixed hyperlipidemia: Secondary | ICD-10-CM | POA: Diagnosis not present

## 2022-04-13 MED ORDER — SPIRONOLACTONE 25 MG PO TABS: 12.5000 mg | ORAL_TABLET | Freq: Every day | ORAL | 3 refills | Status: DC

## 2022-04-13 MED ORDER — CARVEDILOL 25 MG PO TABS: 25.0000 mg | ORAL_TABLET | Freq: Two times a day (BID) | ORAL | 3 refills | Status: DC

## 2022-04-13 MED ORDER — FUROSEMIDE 40 MG PO TABS: 40.0000 mg | ORAL_TABLET | Freq: Every day | ORAL | 3 refills | Status: DC | PRN

## 2022-04-13 MED ORDER — SACUBITRIL-VALSARTAN 97-103 MG PO TABS: 1.0000 | ORAL_TABLET | Freq: Two times a day (BID) | ORAL | 3 refills | Status: AC

## 2022-04-13 MED ORDER — EMPAGLIFLOZIN 10 MG PO TABS: 10.0000 mg | ORAL_TABLET | Freq: Every day | ORAL | 3 refills | Status: DC

## 2022-04-17 ENCOUNTER — Ambulatory Visit: Payer: Medicare Other | Admitting: Infectious Diseases

## 2022-05-12 ENCOUNTER — Encounter: Payer: Self-pay | Admitting: Infectious Diseases

## 2022-05-12 ENCOUNTER — Other Ambulatory Visit (HOSPITAL_COMMUNITY)
Admission: RE | Admit: 2022-05-12 | Discharge: 2022-05-12 | Disposition: A | Discharge status: Home / Self Care | Payer: Medicare Other | Source: Ambulatory Visit | Attending: Infectious Diseases | Admitting: Infectious Diseases

## 2022-05-12 ENCOUNTER — Other Ambulatory Visit: Payer: Self-pay

## 2022-05-12 ENCOUNTER — Ambulatory Visit (INDEPENDENT_AMBULATORY_CARE_PROVIDER_SITE_OTHER): Payer: Medicare Other | Admitting: Infectious Diseases

## 2022-05-12 VITALS — BP 152/95 | HR 78 | Resp 16 | Ht 60.0 in | Wt 286.0 lb

## 2022-05-12 DIAGNOSIS — Z124 Encounter for screening for malignant neoplasm of cervix: Secondary | ICD-10-CM

## 2022-05-12 DIAGNOSIS — Z01419 Encounter for gynecological examination (general) (routine) without abnormal findings: Secondary | ICD-10-CM | POA: Diagnosis present

## 2022-05-12 DIAGNOSIS — Z6841 Body Mass Index (BMI) 40.0 and over, adult: Secondary | ICD-10-CM | POA: Diagnosis not present

## 2022-05-12 DIAGNOSIS — L0292 Furuncle, unspecified: Secondary | ICD-10-CM | POA: Diagnosis not present

## 2022-05-12 DIAGNOSIS — N393 Stress incontinence (female) (male): Secondary | ICD-10-CM | POA: Diagnosis not present

## 2022-05-12 DIAGNOSIS — Z1151 Encounter for screening for human papillomavirus (HPV): Secondary | ICD-10-CM | POA: Diagnosis not present

## 2022-05-12 DIAGNOSIS — B20 Human immunodeficiency virus [HIV] disease: Secondary | ICD-10-CM | POA: Diagnosis present

## 2022-05-12 MED ORDER — FLUCONAZOLE 150 MG PO TABS: 150.0000 mg | ORAL_TABLET | Freq: Every day | ORAL | 0 refills | Status: DC

## 2022-05-12 MED ORDER — MUPIROCIN 2 % EX OINT
1.0000 | TOPICAL_OINTMENT | Freq: Two times a day (BID) | CUTANEOUS | 0 refills | Status: DC
Start: 2022-05-12 — End: 2024-02-08

## 2022-05-12 MED ORDER — SULFAMETHOXAZOLE-TRIMETHOPRIM 800-160 MG PO TABS: 1.0000 | ORAL_TABLET | Freq: Two times a day (BID) | ORAL | 0 refills | Status: DC

## 2022-05-12 MED ORDER — BIKTARVY 50-200-25 MG PO TABS: 1.0000 | ORAL_TABLET | Freq: Every day | ORAL | 11 refills | Status: DC

## 2022-05-12 NOTE — Assessment & Plan Note (Addendum)
Has been in place for about 3 weeks. Has ruptured twice and today on exam is still with a small amount of purulent drainage. May not get much additional benefit from antibiotics but with ongoing drainage will treat 5-days of PO Bactrim BID and have her stop the neosporin/peroxide and focus on mupirocin BID until tube is gone then switch to Aquaphor to restore skin barrier. I think there may be a bit of contact dermatitis present from neosporin that is likely causing delay in wound healing.  Covered with Allevyn foam silicone dressing - extra provided today to help protect from friction the next week. Switch to large bandaid after that covers the site.   Diflucan given for vaginitis treatment as requested

## 2022-05-12 NOTE — Patient Instructions (Addendum)
Keep the foam dressing on for 3-4 days - if the drainage comes through the bandage change it.   STOP the neosporin and peroxide   START the topical Mupirocin ointment twice a day under the dressing to the area.   START bactrim antibiotics one tablet twice a day for 5 days.   Diflucan tablets if you need them   Ask your diabetes doctor about either Mounjaro or Ozempic for assistance with diabetes control and weight reduction.   Will see you back in 6 months

## 2022-05-12 NOTE — Assessment & Plan Note (Signed)
Recollected sample today

## 2022-05-12 NOTE — Addendum Note (Signed)
Addended by: Blanchard Kelch on: 05/12/2022 11:05 AM   Modules accepted: Orders

## 2022-05-12 NOTE — Assessment & Plan Note (Signed)
Very well controlled on once daily Biktarvy. No concerns with access or adherence to medication. They are tolerating the medication well without side effects. No drug interactions identified. VL and CD4 collected today.  Hep B sAg collected (not on file). Vaccinate as indicated (NR sAb at last check).  No dental needs today.  No concern over anxious/depressed mood.  Sexual health discussed including screening needs. Contraception: BTL   No follow-ups on file.

## 2022-05-12 NOTE — Assessment & Plan Note (Signed)
Referral given for pelvic floor PT

## 2022-05-12 NOTE — Progress Notes (Signed)
Name: Sherry Hall  BLT:[id number]   DOB: [birth year]   PCP: Sherry Dew, FNP    Brief History ID: Sherry Hall is a 41 y.o. female with HIV disease. Dx 2011. Intermittently on medications. Co morbidities: T1DM, HTN, Hyperlipidemia, obesity. HIV Risk: heterosexual Hx: OIs: none known  Hep B sAg (not on file); Hep BsAb (neg); Hep B cAb (not on file); Hep C (not on file); Hep A (not on file); HLA B*5701 (-); Quantiferon (-)  Previous Regimens: Complera Odefsey Biktarvy    Genotypes: 2017 - no significant mutations PR/RT   Chief Complaint  Patient presents with   Follow-up    Insect bite on lowerleft abdomen on April 26 2022 did have low fever now it appears to be crusting and draining. Has not seen doctor for this and does not know what bit her in [place].       HPI:   ER visit in June 2023 for CAP - treated with antibiotics and condition resolved.   Pap smear given in May but sample was not diagnostic and required repeat testing - we can do this today for her. She has had a lot of problems with bladder leakage to where she needs to wear daily panty liners to catch urine. She would like to consider treatment for this but not open to any surgery options. Occurs anytime she needs to bear down or cough / sneeze.   July 9th had a bite on the abdomen in [place] on vacation - thinks it was a lizard or a but. Had some purple discoloration around it and swelled up like a blister. Noticed some fevers at first. Has ruptured twice draining grey/tan material and still continues to drain a little.  Has been using peroxide and neosporin around it.    Review of Systems  All other systems reviewed and are negative.     Observations/Objective: Today's Vitals   05/12/22 1008  BP: (!) 152/95  Pulse: 78  Resp: 16  SpO2: 100%  Weight: 286 lb (129.7 kg)  Height: 5' (1.524 m)  PainSc: 0-No pain    Body mass index is 55.86 kg/m.   Physical Exam Vitals reviewed.  Exam conducted with a chaperone present.  Constitutional:      Appearance: Normal appearance. She is not ill-appearing.  HENT:     Mouth/Throat:     Mouth: Mucous membranes are moist.     Pharynx: Oropharynx is clear.  Eyes:     General: No scleral icterus. Pulmonary:     Effort: Pulmonary effort is normal.  Genitourinary:    Pubic Area: No rash.      Comments: Cystocele - difficult to find cervix. I was able to locate cervical tissue for sample but other tissue made procedure difficult Neurological:     Mental Status: She is oriented to person, place, and time.  Psychiatric:        Mood and Affect: Mood normal.        Thought Content: Thought content normal.       HIV 1 RNA Quant (Copies/mL)  Date Value  01/16/2022 33 (H)  08/11/2021 38 (H)  01/03/2021 <20 (H)   CD4 T Cell Abs (/uL)  Date Value  12/12/2020 570  08/10/2019 535  12/26/2018 390 (L)    Lab Results  Component Value Date   CREATININE 1.08 (H) 03/26/2022   CREATININE 1.06 (H) 01/16/2022   CREATININE 0.90 07/18/2021    Lab Results  Component Value Date   WBC 10.5  03/26/2022   HGB 13.5 03/26/2022   HCT 39.4 03/26/2022   MCV 94.0 03/26/2022   PLT 248 03/26/2022    Lab Results  Component Value Date   ALT 12 01/16/2022   AST 10 01/16/2022   ALKPHOS 82 07/18/2021   BILITOT 0.3 01/16/2022     Assessment and Plan:  Problem List Items Addressed This Visit       High   HIV disease ([place]) - Primary (Chronic)    Very well controlled on once daily Biktarvy. No concerns with access or adherence to medication. They are tolerating the medication well without side effects. No drug interactions identified. VL and CD4 collected today.  Hep B sAg collected (not on file). Vaccinate as indicated (NR sAb at last check).  No dental needs today.  No concern over anxious/depressed mood.  Sexual health discussed including screening needs. Contraception: BTL   No follow-ups on file.        Relevant  Medications   sulfamethoxazole-trimethoprim (BACTRIM DS) 800-160 MG tablet   mupirocin ointment (BACTROBAN) 2 %   fluconazole (DIFLUCAN) 150 MG tablet   bictegravir-emtricitabine-tenofovir AF (BIKTARVY) 50-200-25 MG TABS tablet   Other Relevant Orders   HIV 1 RNA quant-no reflex-bld   T-helper cells (CD4) count (not at [facility])     Medium    Obesity    I think she would be a great candidate for Mounjaro or Ozempic to help with weight reduction and glucose control. She is excited to discuss with her endocrine team.         Unprioritized   Stress incontinence of urine    Referral given for pelvic floor PT      Relevant Orders   Ambulatory referral to Physical Therapy   Screening for cervical cancer    Recollected sample today       Boil    Has been in place for about 3 weeks. Has ruptured twice and today on exam is still with a small amount of purulent drainage. May not get much additional benefit from antibiotics but with ongoing drainage will treat 5-days of PO Bactrim BID and have her stop the neosporin/peroxide and focus on mupirocin BID until tube is gone then switch to Aquaphor to restore skin barrier. I think there may be a bit of contact dermatitis present from neosporin that is likely causing delay in wound healing.  Covered with Allevyn foam silicone dressing - extra provided today to help protect from friction the next week. Switch to large bandaid after that covers the site.   Diflucan given for vaginitis treatment as requested       Relevant Medications   sulfamethoxazole-trimethoprim (BACTRIM DS) 800-160 MG tablet   mupirocin ointment (BACTROBAN) 2 %   fluconazole (DIFLUCAN) 150 MG tablet   bictegravir-emtricitabine-tenofovir AF (BIKTARVY) 50-200-25 MG TABS tablet    Janene Madeira, MSN, NP-C [facility] [email or web address] Pager: [phone] Office: [phone] [place]: [phone]

## 2022-05-12 NOTE — Assessment & Plan Note (Signed)
I think she would be a great candidate for Mounjaro or Ozempic to help with weight reduction and glucose control. She is excited to discuss with her endocrine team.

## 2022-05-13 LAB — T-HELPER CELLS (CD4) COUNT (NOT AT ARMC)
CD4 % Helper T Cell: 26 % — ABNORMAL LOW (ref 33–65)
CD4 T Cell Abs: 455 /uL (ref 400–1790)

## 2022-05-15 LAB — CYTOLOGY - PAP
Adequacy: ABSENT
Comment: NEGATIVE
Diagnosis: NEGATIVE
High risk HPV: NEGATIVE

## 2022-05-15 LAB — HIV-1 RNA QUANT-NO REFLEX-BLD
HIV 1 RNA Quant: NOT DETECTED {copies}/mL
HIV-1 RNA Quant, Log: NOT DETECTED {Log_copies}/mL

## 2022-07-28 ENCOUNTER — Encounter: Payer: Self-pay | Admitting: Family Medicine

## 2022-07-28 ENCOUNTER — Ambulatory Visit (INDEPENDENT_AMBULATORY_CARE_PROVIDER_SITE_OTHER): Payer: Medicare Other | Admitting: Family Medicine

## 2022-07-28 VITALS — BP 137/57 | HR 83 | Temp 97.6°F | Ht 60.0 in | Wt 286.8 lb

## 2022-07-28 DIAGNOSIS — Z202 Contact with and (suspected) exposure to infections with a predominantly sexual mode of transmission: Secondary | ICD-10-CM

## 2022-07-28 DIAGNOSIS — E8881 Metabolic syndrome: Secondary | ICD-10-CM

## 2022-07-28 DIAGNOSIS — B379 Candidiasis, unspecified: Secondary | ICD-10-CM

## 2022-07-28 DIAGNOSIS — Z794 Long term (current) use of insulin: Secondary | ICD-10-CM

## 2022-07-28 DIAGNOSIS — E1065 Type 1 diabetes mellitus with hyperglycemia: Secondary | ICD-10-CM

## 2022-07-28 DIAGNOSIS — E118 Type 2 diabetes mellitus with unspecified complications: Secondary | ICD-10-CM

## 2022-07-28 DIAGNOSIS — Z6841 Body Mass Index (BMI) 40.0 and over, adult: Secondary | ICD-10-CM

## 2022-07-28 LAB — POCT URINALYSIS DIP (CLINITEK)
Bilirubin, UA: NEGATIVE
Glucose, UA: >=1000 mg/dL — AB
Ketones, POC UA: NEGATIVE mg/dL
Leukocytes, UA: NEGATIVE
Nitrite, UA: NEGATIVE
POC PROTEIN,UA: 30 — AB
Spec Grav, UA: 1.015 (ref 1.010–1.025)
Urobilinogen, UA: 0.2 E.U./dL
pH, UA: 5.5 (ref 5.0–8.0)

## 2022-07-28 LAB — POCT GLYCOSYLATED HEMOGLOBIN (HGB A1C): HbA1c, POC (controlled diabetic range): 10.2 % — AB (ref 0.0–7.0)

## 2022-07-28 LAB — GLUCOSE, POCT (MANUAL RESULT ENTRY): POC Glucose: 382 mg/dl — AB (ref 70–99)

## 2022-07-28 MED ORDER — SEMAGLUTIDE (1 MG/DOSE) 4 MG/3ML ~~LOC~~ SOPN: 1.0000 mg | PEN_INJECTOR | SUBCUTANEOUS | 11 refills | Status: DC

## 2022-07-28 MED ORDER — FLUCONAZOLE 150 MG PO TABS: 150.0000 mg | ORAL_TABLET | Freq: Every day | ORAL | 1 refills | Status: DC

## 2022-07-28 NOTE — Patient Instructions (Addendum)
Goal weight loss: 28.6 pounds over 3 months Sunday= weigh in day Sunday= Ozempic day Hold meal coverage Novalog Lantus at bedtime Continue Jardiance Meals: 6 small meals throughout the day (Glucerna, Blueberries, almonds, cucumbers, tomatoes).  No cookies, cakes, pies, candy, rice, white bread, pasta, beans, corn, peas.   Semaglutide Injection What is this medication? SEMAGLUTIDE (SEM a GLOO tide) treats type 2 diabetes. It works by increasing insulin levels in your body, which decreases your blood sugar (glucose). It also reduces the amount of sugar released into the blood and slows down your digestion. It can also be used to lower the risk of heart attack and stroke in people with type 2 diabetes. Changes to diet and exercise are often combined with this medication. This medicine may be used for other purposes; ask your health care provider or pharmacist if you have questions. COMMON BRAND NAME(S): OZEMPIC What should I tell my care team before I take this medication? They need to know if you have any of these conditions: Endocrine tumors (MEN 2) or if someone in your family had these tumors Eye disease, vision problems History of pancreatitis Kidney disease Stomach problems Thyroid cancer or if someone in your family had thyroid cancer An unusual or allergic reaction to semaglutide, other medications, foods, dyes, or preservatives Pregnant or trying to get pregnant Breast-feeding How should I use this medication? This medication is for injection under the skin of your upper leg (thigh), stomach area, or upper arm. It is given once every week (every 7 days). You will be taught how to prepare and give this medication. Use exactly as directed. Take your medication at regular intervals. Do not take it more often than directed. If you use this medication with insulin, you should inject this medication and the insulin separately. Do not mix them together. Do not give the injections right next  to each other. Change (rotate) injection sites with each injection. It is important that you put your used needles and syringes in a special sharps container. Do not put them in a trash can. If you do not have a sharps container, call your pharmacist or care team to get one. A special MedGuide will be given to you by the pharmacist with each prescription and refill. Be sure to read this information carefully each time. This medication comes with INSTRUCTIONS FOR USE. Ask your pharmacist for directions on how to use this medication. Read the information carefully. Talk to your pharmacist or care team if you have questions. Talk to your care team about the use of this medication in children. Special care may be needed. Overdosage: If you think you have taken too much of this medicine contact a poison control center or emergency room at once. NOTE: This medicine is only for you. Do not share this medicine with others. What if I miss a dose? If you miss a dose, take it as soon as you can within 5 days after the missed dose. Then take your next dose at your regular weekly time. If it has been longer than 5 days after the missed dose, do not take the missed dose. Take the next dose at your regular time. Do not take double or extra doses. If you have questions about a missed dose, contact your care team for advice. What may interact with this medication? Other medications for diabetes Many medications may cause changes in blood sugar, these include: Alcohol containing beverages Antiviral medications for HIV or AIDS Aspirin and aspirin-like medications Certain medications  for blood pressure, heart disease, irregular heart beat Chromium Diuretics Female hormones, such as estrogens or progestins, birth control pills Fenofibrate Gemfibrozil Isoniazid Lanreotide Female hormones or anabolic steroids MAOIs like Carbex, Eldepryl, Marplan, Nardil, and Parnate Medications for weight loss Medications for  allergies, asthma, cold, or cough Medications for depression, anxiety, or psychotic disturbances Niacin Nicotine NSAIDs, medications for pain and inflammation, like ibuprofen or naproxen Octreotide Pasireotide Pentamidine Phenytoin Probenecid Quinolone antibiotics such as ciprofloxacin, levofloxacin, ofloxacin Some herbal dietary supplements Steroid medications such as prednisone or cortisone Sulfamethoxazole; trimethoprim Thyroid hormones Some medications can hide the warning symptoms of low blood sugar (hypoglycemia). You may need to monitor your blood sugar more closely if you are taking one of these medications. These include: Beta-blockers, often used for high blood pressure or heart problems (examples include atenolol, metoprolol, propranolol) Clonidine Guanethidine Reserpine This list may not describe all possible interactions. Give your health care provider a list of all the medicines, herbs, non-prescription drugs, or dietary supplements you use. Also tell them if you smoke, drink alcohol, or use illegal drugs. Some items may interact with your medicine. What should I watch for while using this medication? Visit your care team for regular checks on your progress. Drink plenty of fluids while taking this medication. Check with your care team if you get an attack of severe diarrhea, nausea, and vomiting. The loss of too much body fluid can make it dangerous for you to take this medication. A test called the HbA1C (A1C) will be monitored. This is a simple blood test. It measures your blood sugar control over the last 2 to 3 months. You will receive this test every 3 to 6 months. Learn how to check your blood sugar. Learn the symptoms of low and high blood sugar and how to manage them. Always carry a quick-source of sugar with you in case you have symptoms of low blood sugar. Examples include hard sugar candy or glucose tablets. Make sure others know that you can choke if you eat or  drink when you develop serious symptoms of low blood sugar, such as seizures or unconsciousness. They must get medical help at once. Tell your care team if you have high blood sugar. You might need to change the dose of your medication. If you are sick or exercising more than usual, you might need to change the dose of your medication. Do not skip meals. Ask your care team if you should avoid alcohol. Many nonprescription cough and cold products contain sugar or alcohol. These can affect blood sugar. Pens should never be shared. Even if the needle is changed, sharing may result in passing of viruses like hepatitis or HIV. Wear a medical ID bracelet or chain, and carry a card that describes your disease and details of your medication and dosage times. Do not become pregnant while taking this medication. Women should inform their care team if they wish to become pregnant or think they might be pregnant. There is a potential for serious side effects to an unborn child. Talk to your care team for more information. What side effects may I notice from receiving this medication? Side effects that you should report to your care team as soon as possible: Allergic reactions--skin rash, itching, hives, swelling of the face, lips, tongue, or throat Change in vision Dehydration--increased thirst, dry mouth, feeling faint or lightheaded, headache, dark yellow or brown urine Gallbladder problems--severe stomach pain, nausea, vomiting, fever Heart palpitations--rapid, pounding, or irregular heartbeat Kidney injury--decrease in the amount  of urine, swelling of the ankles, hands, or feet Pancreatitis--severe stomach pain that spreads to your back or gets worse after eating or when touched, fever, nausea, vomiting Thyroid cancer--new mass or lump in the neck, pain or trouble swallowing, trouble breathing, hoarseness Side effects that usually do not require medical attention (report to your care team if they continue or  are bothersome): Diarrhea Loss of appetite Nausea Stomach pain Vomiting This list may not describe all possible side effects. Call your doctor for medical advice about side effects. You may report side effects to FDA at 1-800-FDA-1088. Where should I keep my medication? Keep out of the reach of children. Store unopened pens in a refrigerator between 2 and 8 degrees C (36 and 46 degrees F). Do not freeze. Protect from light and heat. After you first use the pen, it can be stored for 56 days at room temperature between 15 and 30 degrees C (59 and 86 degrees F) or in a refrigerator. Throw away your used pen after 56 days or after the expiration date, whichever comes first. Do not store your pen with the needle attached. If the needle is left on, medication may leak from the pen. NOTE: This sheet is a summary. It may not cover all possible information. If you have questions about this medicine, talk to your doctor, pharmacist, or health care provider.  2023 Elsevier/Gold Standard (2020-12-19 00:00:00)  Ketogenic Eating Plan, Adult A ketogenic eating plan is a diet that is very low in carbohydrates, moderately low in protein, and very high in fat. Your body normally gets energy from eating carbohydrates. If you limit carbohydrates in your diet, your body will start to use stored fat as an energy source. When fat is broken down for energy, it enters your blood as a substance called ketones. A ketogenic eating plan relies mostly on ketones for energy. This eating plan can cause rapid weight loss because the body burns fat for fuel. What are the benefits of a ketogenic eating plan? Epilepsy Ketogenic eating plans have been well studied as a treatment for epilepsy in children and have been used for many years. These plans have been studied to treat epilepsy in adults too. You will want to work closely with a dietitian if your health care provider suggests this eating plan to manage your seizures. Other  conditions Ketogenic eating plans have also been studied to help treat other conditions, including: Cancer. Type 2 diabetes. Alzheimer's disease. Parkinson's disease. Autism. Multiple sclerosis. More long-term studies are needed to learn the effects of this eating plan on people with these and other conditions. What are the side effects of a ketogenic eating plan? Side effects of a ketogenic diet may include: Vitamin deficiencies. Dehydration. Bad breath. Nausea. Hunger. Constipation. Problems with menstrual periods. Inflammation of the pancreas. Kidney stones or gallbladder stones. Bone fractures. High cholesterol. What are tips for following this plan? Reading food labels Look for foods that have a low glycemic index (GI) label. Read a product's ingredient list to check for hidden or added sugar. Check food labels for the number of grams of carbohydrates and protein in each serving. This is important. Cooking Carefully measure or weigh foods. Make desserts using ketogenic or low GI recipes. Avoid cooking with sauces that have added sugar, such as barbecue sauce or ketchup. Meal planning There are several versions of ketogenic eating plans. The classic ketogenic diet recommends that 90% of your calories come from fat, 6% from protein, and 4% from carbohydrates. Aim for  a daily meal and snack schedule that you can follow steadily. Every meal will include high-fat items, such as avocado, cream, butter, or mayonnaise. Each day, you should have: Fat: __________g. Protein: _________g. Carbohydrates: _________g. General information  Buy a gram scale to weigh foods. This is needed to follow this diet correctly. Your dietitian will teach you how to use it. Ask your health care provider what steps you can take to avoid side effects of this eating plan, such as constipation, dehydration, and kidney stones. Take vitamin and mineral supplements only as told by your health care  provider or dietitian. Work with your dietitian and health care provider to help you stay on track. What foods should I eat?  Fruits Fresh blueberries, blackberries, or raspberries. Other fresh and frozen fruits in small amounts. Vegetables Lettuce. Bok choy. Eggplant. Tomatoes. Cucumbers. Peppers. Cauliflower. Zucchini. Fennel. Swiss chard. Grains Almond, hazelnut, or coconut flours. Meats and other proteins Meat, poultry, and fish. Bacon. Eggs. Egg substitutes. Nuts, nut butters (without added sugar), seeds, lentils, and split peas in small amounts. Dairy Cheese in moderate amounts. Beverages Plain or mineral water. Sugar-free, caffeine-free beverages. Club soda. Caffeine-free, carbohydrate-free herbal tea. Fats and oils Avocado. Cream. Sour cream. Cream cheese. Butter. Plant-based oils, such as olive, canola, and sunflower. Margarine. Mayonnaise. Sweets and desserts Any homemade sweets or desserts made using ketogenic diet recipes. The items listed above may not be a complete list of recommended foods and beverages. Contact a dietitian for more information. What foods should I avoid? Fruits Fruit juice. Fruits packed in syrups. Dried or candied fruits. Vegetables Corn. Potatoes (all kinds). Peas. Grains All bread, dry cereals, and cooked cereals with added sugar. Baked goods. Crackers and pretzels. Meats and other proteins Meat, poultry, or fish prepared with flour or breading. Nut butters with added sugar. Beans. Dairy Milk. Yogurt. Fats and oils Salad dressings with added sugar. Gravies. Beverages Sugar-sweetened teas, coffee drinks, or soft drinks. Juice. Sports drinks. Sweets and desserts All sweets and desserts, unless the dessert is homemade using ketogenic diet recipes. The items listed above may not be a complete list of foods and beverages to avoid. Contact a dietitian for more information. Summary A ketogenic eating plan is a diet that is very low in  carbohydrates, moderately low in protein, and very high in fat. Aim for a daily meal and snack schedule that you can follow steadily. Buy a gram scale to weigh foods. This is needed to follow this diet correctly. Your dietitian will teach you how to use it. Work closely with your health care provider and a dietitian while you are following a ketogenic eating plan. This information is not intended to replace advice given to you by your health care provider. Make sure you discuss any questions you have with your health care provider. Document Revised: 02/15/2020 Document Reviewed: 02/15/2020 Elsevier Patient Education  [place].

## 2022-07-29 LAB — COMPREHENSIVE METABOLIC PANEL
ALT: 8 IU/L (ref 0–32)
AST: 9 IU/L (ref 0–40)
Albumin/Globulin Ratio: 1.2 (ref 1.2–2.2)
Albumin: 4.1 g/dL (ref 3.9–4.9)
Alkaline Phosphatase: 89 IU/L (ref 44–121)
BUN/Creatinine Ratio: 13 (ref 9–23)
BUN: 23 mg/dL (ref 6–24)
Bilirubin Total: <0.2 mg/dL (ref 0.0–1.2)
CO2: 21 mmol/L (ref 20–29)
Calcium: 8.4 mg/dL — ABNORMAL LOW (ref 8.7–10.2)
Chloride: 103 mmol/L (ref 96–106)
Creatinine, Ser: 1.74 mg/dL — ABNORMAL HIGH (ref 0.57–1.00)
Globulin, Total: 3.4 g/dL (ref 1.5–4.5)
Glucose: 359 mg/dL — ABNORMAL HIGH (ref 70–99)
Potassium: 4.2 mmol/L (ref 3.5–5.2)
Sodium: 140 mmol/L (ref 134–144)
Total Protein: 7.5 g/dL (ref 6.0–8.5)
eGFR: 37 mL/min/{1.73_m2} — ABNORMAL LOW (ref >59)

## 2022-07-29 LAB — RPR: RPR Ser Ql: NONREACTIVE

## 2022-07-30 LAB — GC/CHLAMYDIA PROBE AMP
Chlamydia trachomatis, NAA: NEGATIVE
Neisseria Gonorrhoeae by PCR: NEGATIVE

## 2022-08-02 LAB — NUSWAB VAGINITIS PLUS (VG+)
Atopobium vaginae: HIGH Score — AB
BVAB 2: HIGH Score — AB
Candida albicans, NAA: NEGATIVE
Candida glabrata, NAA: POSITIVE — AB
Chlamydia trachomatis, NAA: NEGATIVE
Megasphaera 1: HIGH Score — AB
Neisseria gonorrhoeae, NAA: NEGATIVE
Trich vag by NAA: NEGATIVE

## 2022-08-03 ENCOUNTER — Telehealth: Payer: Self-pay

## 2022-08-03 ENCOUNTER — Other Ambulatory Visit: Payer: Self-pay | Admitting: Family Medicine

## 2022-08-03 DIAGNOSIS — N760 Acute vaginitis: Secondary | ICD-10-CM

## 2022-08-03 DIAGNOSIS — B379 Candidiasis, unspecified: Secondary | ICD-10-CM

## 2022-08-03 DIAGNOSIS — R112 Nausea with vomiting, unspecified: Secondary | ICD-10-CM

## 2022-08-03 MED ORDER — METRONIDAZOLE 0.75 % EX GEL: 1.0000 | Freq: Two times a day (BID) | CUTANEOUS | 0 refills | Status: DC

## 2022-08-03 MED ORDER — ONDANSETRON HCL 4 MG PO TABS: 4.0000 mg | ORAL_TABLET | Freq: Four times a day (QID) | ORAL | 0 refills | Status: DC | PRN

## 2022-08-03 MED ORDER — FLUCONAZOLE 150 MG PO TABS: 150.0000 mg | ORAL_TABLET | Freq: Every day | ORAL | 0 refills | Status: DC

## 2022-08-03 NOTE — Progress Notes (Signed)
Meds ordered this encounter  Medications   ondansetron (ZOFRAN) 4 MG tablet    Sig: Take 1 tablet (4 mg total) by mouth every 6 (six) hours as needed for nausea or vomiting.    Dispense:  30 tablet    Refill:  0    Order Specific Question:   Supervising Provider    Answer:   Tresa Garter [[id number]]   metroNIDAZOLE (METROGEL) 0.75 % gel    Sig: Apply 1 Application topically 2 (two) times daily.    Dispense:  45 g    Refill:  0    Order Specific Question:   Supervising Provider    Answer:   Tresa Garter [[id number]]   fluconazole (DIFLUCAN) 150 MG tablet    Sig: Take 1 tablet (150 mg total) by mouth daily.    Dispense:  2 tablet    Refill:  0    Order Specific Question:   Supervising Provider    Answer:   Tresa Garter [[id number]]    Donia Pounds  APRN, MSN, FNP-C [facility] [address] [place], [place] [address] [phone]

## 2022-08-03 NOTE — Telephone Encounter (Signed)
Patient states that she has taken her first does of ozempic and has be nauseous and had vomiting. She would like an rx to help stop that and any other advice you have to make taking this medication easier.

## 2022-08-03 NOTE — Telephone Encounter (Signed)
Patient has been notified of providers advice. She will make sure she is watching diet and drinking plenty of fluids. She will get the zofran as well and the diflucan.

## 2022-08-03 NOTE — Telephone Encounter (Signed)
Results have been relayed to the patient. The patient verbalized understanding. No questions at this time.   

## 2022-08-24 ENCOUNTER — Other Ambulatory Visit: Payer: Self-pay | Admitting: Family Medicine

## 2022-08-24 DIAGNOSIS — R112 Nausea with vomiting, unspecified: Secondary | ICD-10-CM

## 2022-08-24 NOTE — Telephone Encounter (Signed)
[facility] is requesting to fill pt zofran. Please advise [facility]

## 2022-08-29 NOTE — Progress Notes (Signed)
Established Patient Office Visit  Subjective   Patient ID: Sherry Hall, female    DOB: [birth year]  Age: 41 y.o. MRN: [id number]  Chief Complaint  Patient presents with   Diabetes   Hypertension    Sherry Hall is a 41 year old female with a medical history significant for uncontrolled type 2 DM, hypertension, hyperlipidemia, HIV, morbid obesity, neuropathy, and tobacco dependence presents for a follow up of chronic conditions. Patient also has a complaint of vaginal itching. She has recurrent yeast infections secondary to uncontrolled diabetes mellitus.   Diabetes She presents for her follow-up diabetic visit. She has type 2 diabetes mellitus. Her disease course has been stable. Associated symptoms include foot paresthesias and polyuria. Pertinent negatives for diabetes include no blurred vision, no chest pain, no fatigue, no foot ulcerations, no polydipsia, no polyphagia, no visual change, no weakness and no weight loss. Symptoms are stable. Risk factors for coronary artery disease include diabetes mellitus, dyslipidemia, obesity, sedentary lifestyle and tobacco exposure. Current diabetic treatment includes insulin injections. She is compliant with treatment some of the time. Her weight is increasing steadily. She is following a generally unhealthy diet. She has not had a previous visit with a dietitian. She rarely participates in exercise. She does not see a podiatrist.Eye exam is not current.  Hypertension Pertinent negatives include no blurred vision or chest pain.    Patient Active Problem List   Diagnosis Date Noted   Stress incontinence of urine 05/12/2022   Boil 05/12/2022   Screening for cervical cancer 03/17/2022   Amenorrhea 01/16/2022   Seasonal allergies 01/16/2022   Syphilis 42/70/6237   Chronic systolic congestive heart failure ([place]) 01/03/2021   Bilateral hand numbness 01/03/2021   Tobacco dependence 10/20/2016   Type 1 diabetes mellitus with  hyperglycemia ([place]) 01/03/2016   HTN (hypertension) 09/16/2015   HIV disease ([place]) 09/16/2015   Depression 09/16/2015   Numbness and tingling of both legs below knees 06/05/2015   Hypertension due to endocrine disorder 12/28/2014   Insomnia 04/04/2012   Microalbuminuria 04/04/2012   Hyperlipidemia with target LDL less than 100 03/21/2012   Obesity 03/21/2012   Posttraumatic stress disorder 10/01/2011   Anxiety disorder 07/28/2011   Past Medical History:  Diagnosis Date   Anemia    H/O   Anxiety    Depression    Diabetes mellitus type 1 ([place])    GERD (gastroesophageal reflux disease)    HIV (human immunodeficiency virus infection) ([place]) 2011   Hypertension    Type 2 diabetes mellitus ([place])    Past Surgical History:  Procedure Laterality Date   CESAREAN SECTION  2001, 2008, 2009, 2011   CHOLECYSTECTOMY     EXCISION OF BREAST LESION N/A 01/04/2018   Procedure: EXCISION OF CHEST WALL ABSCESS. MIDDLE CHEST;  Surgeon: Olean Ree, MD;  Location: [facility] ORS;  Service: General;  Laterality: N/A;  resection of chest wall cyst / abscess   FRACTURE SURGERY Right    HAND   TONSILLECTOMY     TUBAL LIGATION  2011   Social History   Tobacco Use   Smoking status: Every Day    Packs/day: 0.50    Years: 15.00    Total pack years: 7.50    Types: Cigarettes   Smokeless tobacco: Never   Tobacco comments:    trying to slow, cutting back  Vaping Use   Vaping Use: Never used  Substance Use Topics   Alcohol use: Yes    Alcohol/week: 1.0 standard drink of alcohol  Types: 1 Standard drinks or equivalent per week    Comment: socially   Drug use: No   Social History   Socioeconomic History   Marital status: Single    Spouse name: Not on file   Number of children: Not on file   Years of education: Not on file   Highest education level: Not on file  Occupational History   Not on file  Tobacco Use   Smoking status: Every Day    Packs/day: 0.50    Years: 15.00    Total pack  years: 7.50    Types: Cigarettes   Smokeless tobacco: Never   Tobacco comments:    trying to slow, cutting back  Vaping Use   Vaping Use: Never used  Substance and Sexual Activity   Alcohol use: Yes    Alcohol/week: 1.0 standard drink of alcohol    Types: 1 Standard drinks or equivalent per week    Comment: socially   Drug use: No   Sexual activity: Yes    Partners: Male    Birth control/protection: Surgical    Comment: declined condoms  Other Topics Concern   Not on file  Social History Narrative   Not on file   Social Determinants of Health   Financial Resource Strain: Low Risk  (12/18/2021)   Overall Financial Resource Strain (CARDIA)    Difficulty of Paying Living Expenses: Not very hard  Food Insecurity: No Food Insecurity (12/18/2021)   Hunger Vital Sign    Worried About Running Out of Food in the Last Year: Never true    Ran Out of Food in the Last Year: Never true  Transportation Needs: No Transportation Needs (12/18/2021)   PRAPARE - Hydrologist (Medical): No    Lack of Transportation (Non-Medical): No  Physical Activity: Inactive (12/18/2021)   Exercise Vital Sign    Days of Exercise per Week: 0 days    Minutes of Exercise per Session: 0 min  Stress: Stress Concern Present (12/18/2021)   [place]    Feeling of Stress : Very much  Social Connections: Moderately Isolated (12/18/2021)   Social Connection and Isolation Panel [NHANES]    Frequency of Communication with Friends and Family: More than three times a week    Frequency of Social Gatherings with Friends and Family: Never    Attends Religious Services: Never    Marine scientist or Organizations: No    Attends Archivist Meetings: Never    Marital Status: Living with partner  Intimate Partner Violence: Not At Risk (12/18/2021)   Humiliation, Afraid, Rape, and Kick questionnaire    Fear of Current or  Ex-Partner: No    Emotionally Abused: No    Physically Abused: No    Sexually Abused: No   Family Status  Relation Name Status   Father  (Not Specified)   Mother  (Not Specified)   Family History  Problem Relation Age of Onset   Diabetes Father    Hypertension Father    Cancer Mother        cervical    Allergies  Allergen Reactions   Percocet [Oxycodone-Acetaminophen] Hives, Itching and Rash   Latex Rash      Review of Systems  Constitutional:  Negative for fatigue and weight loss.  HENT: Negative.    Eyes: Negative.  Negative for blurred vision.  Respiratory: Negative.    Cardiovascular:  Negative for chest pain.  Gastrointestinal:  Positive for nausea. Negative for vomiting.  Genitourinary: Negative.   Musculoskeletal: Negative.   Skin: Negative.   Neurological:  Positive for tingling. Negative for weakness.  Endo/Heme/Allergies:  Negative for polydipsia and polyphagia.      Objective:     BP (!) 137/57   Pulse 83   Temp 97.6 F (36.4 C) (Temporal)   Ht 5' (1.524 m)   Wt 286 lb 12.8 oz (130.1 kg)   LMP 06/29/2022 (Approximate)   SpO2 100%   BMI 56.01 kg/m  BP Readings from Last 3 Encounters:  07/28/22 (!) 137/57  05/12/22 (!) 152/95  04/13/22 (!) 152/70   Wt Readings from Last 3 Encounters:  07/28/22 286 lb 12.8 oz (130.1 kg)  05/12/22 286 lb (129.7 kg)  04/13/22 282 lb 9.6 oz (128.2 kg)      Physical Exam Constitutional:      Appearance: She is obese.  Eyes:     Pupils: Pupils are equal, round, and reactive to light.  Cardiovascular:     Rate and Rhythm: Normal rate and regular rhythm.  Pulmonary:     Effort: Pulmonary effort is normal.  Abdominal:     General: Bowel sounds are normal.  Musculoskeletal:        General: Normal range of motion.  Skin:    General: Skin is warm.  Neurological:     General: No focal deficit present.     Mental Status: Mental status is at baseline.  Psychiatric:        Mood and Affect: Mood normal.       Results for orders placed or performed in visit on 07/28/22  GC/Chlamydia Probe Amp([facility])   Specimen: Urine   UR  Result Value Ref Range   Chlamydia trachomatis, NAA Negative Negative   Neisseria Gonorrhoeae by PCR Negative Negative  NuSwab Vaginitis Plus (VG+)  Result Value Ref Range   Atopobium vaginae High - 2 (A) Score   BVAB 2 High - 2 (A) Score   Megasphaera 1 High - 2 (A) Score   Candida albicans, NAA Negative Negative   Candida glabrata, NAA Positive (A) Negative   Trich vag by NAA Negative Negative   Chlamydia trachomatis, NAA Negative Negative   Neisseria gonorrhoeae, NAA Negative Negative  RPR  Result Value Ref Range   RPR Ser Ql Non Reactive Non Reactive  Comprehensive metabolic panel  Result Value Ref Range   Glucose 359 (H) 70 - 99 mg/dL   BUN 23 6 - 24 mg/dL   Creatinine, Ser 1.74 (H) 0.57 - 1.00 mg/dL   eGFR 37 (L) >59 mL/min/1.73   BUN/Creatinine Ratio 13 9 - 23   Sodium 140 134 - 144 mmol/L   Potassium 4.2 3.5 - 5.2 mmol/L   Chloride 103 96 - 106 mmol/L   CO2 21 20 - 29 mmol/L   Calcium 8.4 (L) 8.7 - 10.2 mg/dL   Total Protein 7.5 6.0 - 8.5 g/dL   Albumin 4.1 3.9 - 4.9 g/dL   Globulin, Total 3.4 1.5 - 4.5 g/dL   Albumin/Globulin Ratio 1.2 1.2 - 2.2   Bilirubin Total <0.2 0.0 - 1.2 mg/dL   Alkaline Phosphatase 89 44 - 121 IU/L   AST 9 0 - 40 IU/L   ALT 8 0 - 32 IU/L  POCT glycosylated hemoglobin (Hb A1C)  Result Value Ref Range   Hemoglobin A1C     HbA1c POC (<> result, manual entry)     HbA1c, POC (prediabetic range)  HbA1c, POC (controlled diabetic range) 10.2 (A) 0.0 - 7.0 %  POCT glucose (manual entry)  Result Value Ref Range   POC Glucose 382 (A) 70 - 99 mg/dl  POCT URINALYSIS DIP (CLINITEK)  Result Value Ref Range   Color, UA yellow yellow   Clarity, UA clear clear   Glucose, UA >=1,000 (A) negative mg/dL   Bilirubin, UA negative negative   Ketones, POC UA negative negative mg/dL   Spec Grav, UA 1.015 1.010 - 1.025    Blood, UA trace-intact (A) negative   pH, UA 5.5 5.0 - 8.0   POC PROTEIN,UA =30 (A) negative, trace   Urobilinogen, UA 0.2 0.2 or 1.0 E.U./dL   Nitrite, UA Negative Negative   Leukocytes, UA Negative Negative    Last CBC Lab Results  Component Value Date   WBC 10.5 03/26/2022   HGB 13.5 03/26/2022   HCT 39.4 03/26/2022   MCV 94.0 03/26/2022   MCH 32.2 03/26/2022   RDW 12.5 03/26/2022   PLT 248 62/94/7654   Last metabolic panel Lab Results  Component Value Date   GLUCOSE 359 (H) 07/28/2022   NA 140 07/28/2022   K 4.2 07/28/2022   CL 103 07/28/2022   CO2 21 07/28/2022   BUN 23 07/28/2022   CREATININE 1.74 (H) 07/28/2022   EGFR 37 (L) 07/28/2022   CALCIUM 8.4 (L) 07/28/2022   PROT 7.5 07/28/2022   ALBUMIN 4.1 07/28/2022   LABGLOB 3.4 07/28/2022   AGRATIO 1.2 07/28/2022   BILITOT <0.2 07/28/2022   ALKPHOS 89 07/28/2022   AST 9 07/28/2022   ALT 8 07/28/2022   ANIONGAP 14 03/26/2022   Last lipids Lab Results  Component Value Date   CHOL 235 (H) 12/26/2018   HDL 51 12/26/2018   LDLCALC 147 (H) 12/26/2018   TRIG 230 (H) 12/26/2018   CHOLHDL 4.6 12/26/2018   Last hemoglobin A1c Lab Results  Component Value Date   HGBA1C 10.2 (A) 07/28/2022   Last thyroid functions Lab Results  Component Value Date   TSH 1.42 07/08/2016   Last vitamin D No results found for: "25OHVITD2", "25OHVITD3", "VD25OH" Last vitamin B12 and Folate Lab Results  Component Value Date   VITAMINB12 501 01/27/2021      The ASCVD Risk score (Arnett DK, et al., 2019) failed to calculate for the following reasons:   Cannot find a previous HDL lab   Cannot find a previous total cholesterol lab    Assessment & Plan:   Problem List Items Addressed This Visit       Endocrine   Type 1 diabetes mellitus with hyperglycemia (HCC)   Relevant Medications   Semaglutide, 1 MG/DOSE, 4 MG/3ML SOPN   Other Visit Diagnoses     Type 2 diabetes mellitus with complication, with long-term current  use of insulin (HCC)    -  Primary   Relevant Medications   Semaglutide, 1 MG/DOSE, 4 MG/3ML SOPN   Other Relevant Orders   POCT glycosylated hemoglobin (Hb A1C) (Completed)   POCT glucose (manual entry) (Completed)   POCT URINALYSIS DIP (CLINITEK) (Completed)   Comprehensive metabolic panel (Completed)   Yeast infection       Relevant Orders   NuSwab Vaginitis Plus (VG+) (Completed)   Exposure to STD       Relevant Orders   RPR (Completed)   GC/Chlamydia Probe Amp([facility]) (Completed)   Metabolic syndrome       Morbid obesity with BMI of 50.0-59.9, adult ([place])       Relevant  Medications   Semaglutide, 1 MG/DOSE, 4 MG/3ML SOPN     Type 2 diabetes mellitus with complication, with long-term current use of insulin (HCC) Hemoglobin A1c is 8.1, which is increased from previous. Will add semaglutide today. Discussed at length. Patient expressed understanding.  - POCT glycosylated hemoglobin (Hb A1C) - POCT glucose (manual entry) - POCT URINALYSIS DIP (CLINITEK) - Semaglutide, 1 MG/DOSE, 4 MG/3ML SOPN; Inject 1 mg as directed once a week.  Dispense: 3 mL; Refill: 11 - Comprehensive metabolic panel   Yeast infection  - NuSwab Vaginitis Plus (VG+)  Exposure to STD  - RPR - GC/Chlamydia Probe Amp([facility])  Metabolic syndrome The patient is asked to make an attempt to improve diet and exercise patterns to aid in medical management of this problem.   Morbid obesity with BMI of 50.0-59.9, adult ([place]) Last Weight  Most recent update: 07/28/2022 12:11 PM    Weight  130.1 kg (286 lb 12.8 oz)              Return in about 3 months (around 10/28/2022) for diabetes.   Donia Pounds  APRN, MSN, FNP-C [facility] [address] [place], [place] [address] [phone]

## 2022-09-01 ENCOUNTER — Other Ambulatory Visit: Payer: Self-pay | Admitting: Family Medicine

## 2022-09-01 DIAGNOSIS — B379 Candidiasis, unspecified: Secondary | ICD-10-CM

## 2022-09-03 NOTE — Telephone Encounter (Signed)
Caller & Relationship to patient:  MRN #  [id number]   Call Back Number: [phone]  Date of Last Office Visit: 08/24/2022    Date of Next Office Visit: negative1/16/2024   Medication(s) to be Refilled: Diflucan  Pref[facility]acy[facility] [phone]  ** Please notify patient to allow 48-72 hours to process** **Let patient know to contact pharmacy at the end of the day to make sure medication is ready. ** **If patient has not been seen in a year or longer, book an appointment **Advise to use MyChart for refill requests OR to contact their pharmacy

## 2022-09-28 ENCOUNTER — Ambulatory Visit: Payer: Medicare Other | Admitting: Cardiovascular Disease

## 2022-11-02 ENCOUNTER — Other Ambulatory Visit: Payer: Self-pay | Admitting: Family Medicine

## 2022-11-02 DIAGNOSIS — R112 Nausea with vomiting, unspecified: Secondary | ICD-10-CM

## 2022-11-03 ENCOUNTER — Ambulatory Visit: Payer: Medicare Other | Admitting: Family Medicine

## 2022-11-03 ENCOUNTER — Encounter: Payer: Self-pay | Admitting: Family Medicine

## 2022-11-03 VITALS — BP 178/92 | HR 101 | Temp 97.9°F | Ht 60.0 in | Wt 263.0 lb

## 2022-11-03 DIAGNOSIS — E1065 Type 1 diabetes mellitus with hyperglycemia: Secondary | ICD-10-CM

## 2022-11-03 DIAGNOSIS — B20 Human immunodeficiency virus [HIV] disease: Secondary | ICD-10-CM

## 2022-11-03 DIAGNOSIS — Z6841 Body Mass Index (BMI) 40.0 and over, adult: Secondary | ICD-10-CM

## 2022-11-03 DIAGNOSIS — I10 Essential (primary) hypertension: Secondary | ICD-10-CM

## 2022-11-03 DIAGNOSIS — Z794 Long term (current) use of insulin: Secondary | ICD-10-CM

## 2022-11-03 DIAGNOSIS — E118 Type 2 diabetes mellitus with unspecified complications: Secondary | ICD-10-CM | POA: Diagnosis not present

## 2022-11-03 LAB — POCT GLYCOSYLATED HEMOGLOBIN (HGB A1C): Hemoglobin A1C: 5.7 % — AB (ref 4.0–5.6)

## 2022-11-03 MED ORDER — SEMAGLUTIDE (1 MG/DOSE) 4 MG/3ML ~~LOC~~ SOPN: 2.0000 mg | PEN_INJECTOR | SUBCUTANEOUS | 11 refills | Status: DC

## 2022-11-03 NOTE — Patient Instructions (Signed)
  Discontinue Lantus at bedtime  You will increase Ozempic to 2 mg/week  Increase exercises to around 150 minutes per week and add strength training

## 2022-11-03 NOTE — Progress Notes (Signed)
Established Patient Office Visit  Subjective   Patient ID: Sherry Hall, female    DOB: 04/27/1981  Age: 42 y.o. MRN: [id number]  Chief Complaint  Patient presents with   Diabetes    Follow up    Sherry Hall is a 42 year old female with a medical history significant for type 2 diabetes mellitus, hypertension, obesity, HIV, hyperlipidemia, and tobacco dependence presents for follow-up of chronic conditions.  During previous appointment, patient was prescribed Ozempic for type 2 diabetes mellitus and obesity.  Patient has decreased weight by 23 pounds since her last appointment.  Patient states that she has been doing well on medication.  She has not started exercise regimen. Patient did not take antihypertensive medications prior to arrival. Patient has been following with her specialist as scheduled.  She has not been checking blood sugars daily.  Patient has continued on Lantus at bedtime.  She denies any polyuria, polydipsia, or polyphagia.   Diabetes She presents for her follow-up diabetic visit. She has type 2 diabetes mellitus. Her disease course has been stable. Pertinent negatives for diabetes include no blurred vision, no chest pain, no fatigue, no foot paresthesias, no foot ulcerations, no polydipsia, no polyphagia, no polyuria, no visual change, no weakness and no weight loss. Current diabetic treatment includes oral agent (dual therapy) and intensive insulin program.    Patient Active Problem List   Diagnosis Date Noted   Stress incontinence of urine 05/12/2022   Boil 05/12/2022   Screening for cervical cancer 03/17/2022   Amenorrhea 01/16/2022   Seasonal allergies 01/16/2022   Syphilis [id number]   Chronic systolic congestive heart failure ([place]) 01/03/2021   Bilateral hand numbness 01/03/2021   Tobacco dependence 10/20/2016   Type 1 diabetes mellitus with hyperglycemia ([place]) 01/03/2016   HTN (hypertension) 09/16/2015   HIV disease ([place]) 09/16/2015    Depression 09/16/2015   Numbness and tingling of both legs below knees 06/05/2015   Hypertension due to endocrine disorder 12/28/2014   Insomnia 04/04/2012   Microalbuminuria 04/04/2012   Hyperlipidemia with target LDL less than 100 03/21/2012   Obesity 03/21/2012   Posttraumatic stress disorder 10/01/2011   Anxiety disorder 07/28/2011   Past Medical History:  Diagnosis Date   Anemia    H/O   Anxiety    Depression    Diabetes mellitus type 1 ([place])    GERD (gastroesophageal reflux disease)    HIV (human immunodeficiency virus infection) ([place]) 2011   Hypertension    Type 2 diabetes mellitus ([place])    Past Surgical History:  Procedure Laterality Date   CESAREAN SECTION  2001, 2008, 2009, 2011   CHOLECYSTECTOMY     EXCISION OF BREAST LESION N/A 01/04/2018   Procedure: EXCISION OF CHEST WALL ABSCESS. MIDDLE CHEST;  Surgeon: Olean Ree, MD;  Location: [facility] ORS;  Service: General;  Laterality: N/A;  resection of chest wall cyst / abscess   FRACTURE SURGERY Right    HAND   TONSILLECTOMY     TUBAL LIGATION  2011   Social History   Tobacco Use   Smoking status: Every Day    Packs/day: 0.50    Years: 15.00    Total pack years: 7.50    Types: Cigarettes   Smokeless tobacco: Never   Tobacco comments:    trying to slow, cutting back  Vaping Use   Vaping Use: Never used  Substance Use Topics   Alcohol use: Yes    Alcohol/week: 1.0 standard drink of alcohol    Types: 1 Standard  drinks or equivalent per week    Comment: socially   Drug use: No   Social History   Socioeconomic History   Marital status: Single    Spouse name: Not on file   Number of children: Not on file   Years of education: Not on file   Highest education level: Not on file  Occupational History   Not on file  Tobacco Use   Smoking status: Every Day    Packs/day: 0.50    Years: 15.00    Total pack years: 7.50    Types: Cigarettes   Smokeless tobacco: Never   Tobacco comments:    trying to slow,  cutting back  Vaping Use   Vaping Use: Never used  Substance and Sexual Activity   Alcohol use: Yes    Alcohol/week: 1.0 standard drink of alcohol    Types: 1 Standard drinks or equivalent per week    Comment: socially   Drug use: No   Sexual activity: Yes    Partners: Male    Birth control/protection: Surgical    Comment: declined condoms  Other Topics Concern   Not on file  Social History Narrative   Not on file   Social Determinants of Health   Financial Resource Strain: Low Risk  (12/18/2021)   Overall Financial Resource Strain (CARDIA)    Difficulty of Paying Living Expenses: Not very hard  Food Insecurity: No Food Insecurity (12/18/2021)   Hunger Vital Sign    Worried About Running Out of Food in the Last Year: Never true    Ran Out of Food in the Last Year: Never true  Transportation Needs: No Transportation Needs (12/18/2021)   PRAPARE - Hydrologist (Medical): No    Lack of Transportation (Non-Medical): No  Physical Activity: Inactive (12/18/2021)   Exercise Vital Sign    Days of Exercise per Week: 0 days    Minutes of Exercise per Session: 0 min  Stress: Stress Concern Present (12/18/2021)   [place]    Feeling of Stress : Very much  Social Connections: Moderately Isolated (12/18/2021)   Social Connection and Isolation Panel [NHANES]    Frequency of Communication with Friends and Family: More than three times a week    Frequency of Social Gatherings with Friends and Family: Never    Attends Religious Services: Never    Marine scientist or Organizations: No    Attends Archivist Meetings: Never    Marital Status: Living with partner  Intimate Partner Violence: Not At Risk (12/18/2021)   Humiliation, Afraid, Rape, and Kick questionnaire    Fear of Current or Ex-Partner: No    Emotionally Abused: No    Physically Abused: No    Sexually Abused: No   Family  Status  Relation Name Status   Father  (Not Specified)   Mother  (Not Specified)   Family History  Problem Relation Age of Onset   Diabetes Father    Hypertension Father    Cancer Mother        cervical    Allergies  Allergen Reactions   Percocet [Oxycodone-Acetaminophen] Hives, Itching and Rash   Latex Rash      Review of Systems  Constitutional:  Negative for fatigue and weight loss.  HENT: Negative.    Eyes:  Negative for blurred vision.  Respiratory: Negative.    Cardiovascular:  Negative for chest pain.  Gastrointestinal: Negative.  Genitourinary: Negative.   Musculoskeletal: Negative.   Neurological: Negative.  Negative for weakness.  Endo/Heme/Allergies:  Negative for polydipsia and polyphagia.  Psychiatric/Behavioral: Negative.        Objective:     BP (!) 178/92 Comment: manual  Pulse (!) 101   Temp 97.9 F (36.6 C)   Ht 5' (1.524 m)   Wt 263 lb (119.3 kg)   LMP 10/22/2022   SpO2 100%   BMI 51.36 kg/m  BP Readings from Last 3 Encounters:  11/03/22 (!) 178/92  07/28/22 (!) 137/57  05/12/22 (!) 152/95   Wt Readings from Last 3 Encounters:  11/03/22 263 lb (119.3 kg)  07/28/22 286 lb 12.8 oz (130.1 kg)  05/12/22 286 lb (129.7 kg)      Physical Exam Constitutional:      Appearance: Normal appearance.  Eyes:     Pupils: Pupils are equal, round, and reactive to light.  Cardiovascular:     Rate and Rhythm: Normal rate and regular rhythm.     Pulses: Normal pulses.  Pulmonary:     Effort: Pulmonary effort is normal.  Abdominal:     General: Bowel sounds are normal.  Musculoskeletal:        General: Normal range of motion.  Skin:    General: Skin is warm.  Neurological:     General: No focal deficit present.     Mental Status: She is alert. Mental status is at baseline.  Psychiatric:        Mood and Affect: Mood normal.        Behavior: Behavior normal.        Thought Content: Thought content normal.        Judgment: Judgment normal.       Results for orders placed or performed in visit on 11/03/22  Microalbumin/Creatinine Ratio, Urine  Result Value Ref Range   Creatinine, Urine 212.3 Not Estab. mg/dL   Microalbumin, Urine 2,611.3 Not Estab. ug/mL   Microalb/Creat Ratio 1,230 (H) 0 - 29 mg/g creat  POCT glycosylated hemoglobin (Hb A1C)  Result Value Ref Range   Hemoglobin A1C 5.7 (A) 4.0 - 5.6 %   HbA1c POC (<> result, manual entry)     HbA1c, POC (prediabetic range)     HbA1c, POC (controlled diabetic range)      Last CBC Lab Results  Component Value Date   WBC 10.5 03/26/2022   HGB 13.5 03/26/2022   HCT 39.4 03/26/2022   MCV 94.0 03/26/2022   MCH 32.2 03/26/2022   RDW 12.5 03/26/2022   PLT 248 [id number]   Last metabolic panel Lab Results  Component Value Date   GLUCOSE 359 (H) 07/28/2022   NA 140 07/28/2022   K 4.2 07/28/2022   CL 103 07/28/2022   CO2 21 07/28/2022   BUN 23 07/28/2022   CREATININE 1.74 (H) 07/28/2022   EGFR 37 (L) 07/28/2022   CALCIUM 8.4 (L) 07/28/2022   PROT 7.5 07/28/2022   ALBUMIN 4.1 07/28/2022   LABGLOB 3.4 07/28/2022   AGRATIO 1.2 07/28/2022   BILITOT <0.2 07/28/2022   ALKPHOS 89 07/28/2022   AST 9 07/28/2022   ALT 8 07/28/2022   ANIONGAP 14 03/26/2022   Last lipids Lab Results  Component Value Date   CHOL 235 (H) 12/26/2018   HDL 51 12/26/2018   LDLCALC 147 (H) 12/26/2018   TRIG 230 (H) 12/26/2018   CHOLHDL 4.6 12/26/2018   Last hemoglobin A1c Lab Results  Component Value Date   HGBA1C 5.7 (A) 11/03/2022  Last thyroid functions Lab Results  Component Value Date   TSH 1.42 07/08/2016   Last vitamin D No results found for: "25OHVITD2", "25OHVITD3", "VD25OH" Last vitamin B12 and Folate Lab Results  Component Value Date   [id number] 01/27/2021      The ASCVD Risk score (Arnett DK, et al., 2019) failed to calculate for the following reasons:   Cannot find a previous HDL lab   Cannot find a previous total cholesterol lab    Assessment &  Plan:   Problem List Items Addressed This Visit       Other   HIV disease ([place]) (Chronic)   Other Visit Diagnoses     Type 2 diabetes mellitus with complication, with long-term current use of insulin ([place])    -  Primary   Relevant Orders   Microalbumin/Creatinine Ratio, Urine (Completed)   POCT glycosylated hemoglobin (Hb A1C) (Completed)   Ambulatory referral to Ophthalmology   Comprehensive metabolic panel   Lipid Panel   Ambulatory referral to Podiatry   Morbid obesity with BMI of 50.0-59.9, adult ([place])       Essential hypertension         1. Type 2 diabetes mellitus with complication, with long-term current use of insulin ([place]) Patient has done remarkably well with diabetes control over the past several months.  Her A1c is decreased to 5.7 from 8.1.  Will discontinue Lantus at this time.  Patient advised to continue to check her blood sugars a.m. and p.m.  Carbohydrate modified diet recommended. - Microalbumin/Creatinine Ratio, Urine - POCT glycosylated hemoglobin (Hb A1C) - Ambulatory referral to Ophthalmology - Comprehensive metabolic panel; Future - Lipid Panel; Future - Ambulatory referral to Podiatry  2. Morbid obesity with BMI of 50.0-59.9, adult [facility]) The patient is asked to make an attempt to improve diet and exercise patterns to aid in medical management of this problem.   3. Essential hypertension BP (!) 178/92 Comment: manual  Pulse (!) 101   Temp 97.9 F (36.6 C)   Ht 5' (1.524 m)   Wt 263 lb (119.3 kg)   LMP 10/22/2022   SpO2 100%   BMI 51.36 kg/m  Patient will return over the next several weeks to repeat blood pressure.  Advised to take antihypertensive greater than 30 minutes prior to arrival.  Patient expressed understanding.  4. HIV disease ([place]) Continue to follow-up with infectious disease specialist as scheduled.  Patient has been taking all prescribed medications consistently.   Return in about 3 months (around 02/02/2023) for obesity.    Donia Pounds  APRN, MSN, FNP-C Patient [place] [facility] [address] [place], [place] [address] [phone]

## 2022-11-04 LAB — MICROALBUMIN / CREATININE URINE RATIO
Creatinine, Urine: 212.3 mg/dL
Microalb/Creat Ratio: 1230 mg/g creat — ABNORMAL HIGH (ref 0–29)
Microalbumin, Urine: 2611.3 ug/mL

## 2022-11-10 ENCOUNTER — Telehealth: Payer: Self-pay | Admitting: Family Medicine

## 2022-11-10 NOTE — Telephone Encounter (Signed)
Caller & Relationship to patient:  MRN #  [id number]   Call Back Number:   Date of Last Office Visit: 11/03/2022     Date of Next Office Visit: 12/04/2022    Medication(s) to be Refilled: pt asked per v/m to rewrite her ozempic per pharmacy to a different dose  Preferred Pharmacy:   ** Please notify patient to allow 48-72 hours to process** **Let patient know to contact pharmacy at the end of the day to make sure medication is ready. ** **If patient has not been seen in a year or longer, book an appointment **Advise to use MyChart for refill requests OR to contact their pharmacy

## 2022-11-11 ENCOUNTER — Other Ambulatory Visit: Payer: Self-pay | Admitting: Family Medicine

## 2022-11-11 DIAGNOSIS — E118 Type 2 diabetes mellitus with unspecified complications: Secondary | ICD-10-CM

## 2022-11-11 MED ORDER — SEMAGLUTIDE (1 MG/DOSE) 4 MG/3ML ~~LOC~~ SOPN: 1.0000 mg | PEN_INJECTOR | SUBCUTANEOUS | 11 refills | Status: DC

## 2022-11-11 NOTE — Progress Notes (Signed)
Meds ordered this encounter  Medications   Semaglutide, 1 MG/DOSE, 4 MG/3ML SOPN    Sig: Inject 1 mg as directed once a week.    Dispense:  3 mL    Refill:  11    Order Specific Question:   Supervising Provider    Answer:   Tresa Garter [[id number]]   Donia Pounds  APRN, MSN, FNP-C [facility] [place] [address] [place], [place] [address] [phone]

## 2022-11-24 ENCOUNTER — Other Ambulatory Visit: Payer: Self-pay

## 2022-11-24 MED ORDER — SEMAGLUTIDE (2 MG/DOSE) 8 MG/3ML ~~LOC~~ SOPN: 2.0000 mg | PEN_INJECTOR | SUBCUTANEOUS | 11 refills | Status: DC

## 2022-11-26 ENCOUNTER — Ambulatory Visit (INDEPENDENT_AMBULATORY_CARE_PROVIDER_SITE_OTHER): Payer: Medicare Other | Admitting: Podiatry

## 2022-11-26 DIAGNOSIS — Z91199 Patient's noncompliance with other medical treatment and regimen due to unspecified reason: Secondary | ICD-10-CM

## 2022-11-26 NOTE — Progress Notes (Signed)
Pt was a no show for apt, charge generated 

## 2022-12-02 ENCOUNTER — Other Ambulatory Visit: Payer: Self-pay | Admitting: Family Medicine

## 2022-12-02 DIAGNOSIS — R112 Nausea with vomiting, unspecified: Secondary | ICD-10-CM

## 2022-12-02 NOTE — Telephone Encounter (Signed)
Caller & Relationship to patient:  MRN #  [id number]   Call Back Number:   Date of Last Office Visit: 11/24/2022     Date of Next Office Visit: 12/04/2022    Medication(s) to be Refilled: see below   Preferred Pharmacy:   ** Please notify patient to allow 48-72 hours to process** **Let patient know to contact pharmacy at the end of the day to make sure medication is ready. ** **If patient has not been seen in a year or longer, book an appointment **Advise to use MyChart for refill requests OR to contact their pharmacy

## 2022-12-04 ENCOUNTER — Other Ambulatory Visit: Payer: Self-pay

## 2022-12-04 ENCOUNTER — Ambulatory Visit: Payer: Self-pay

## 2022-12-10 ENCOUNTER — Other Ambulatory Visit: Payer: Medicare Other

## 2022-12-10 DIAGNOSIS — Z794 Long term (current) use of insulin: Secondary | ICD-10-CM

## 2022-12-11 LAB — COMPREHENSIVE METABOLIC PANEL
ALT: 8 IU/L (ref 0–32)
AST: 9 IU/L (ref 0–40)
Albumin/Globulin Ratio: 1.2 (ref 1.2–2.2)
Albumin: 3.7 g/dL — ABNORMAL LOW (ref 3.9–4.9)
Alkaline Phosphatase: 65 IU/L (ref 44–121)
BUN/Creatinine Ratio: 10 (ref 9–23)
BUN: 11 mg/dL (ref 6–24)
Bilirubin Total: <0.2 mg/dL (ref 0.0–1.2)
CO2: 22 mmol/L (ref 20–29)
Calcium: 8.7 mg/dL (ref 8.7–10.2)
Chloride: 105 mmol/L (ref 96–106)
Creatinine, Ser: 1.05 mg/dL — ABNORMAL HIGH (ref 0.57–1.00)
Globulin, Total: 3 g/dL (ref 1.5–4.5)
Glucose: 124 mg/dL — ABNORMAL HIGH (ref 70–99)
Potassium: 4.1 mmol/L (ref 3.5–5.2)
Sodium: 139 mmol/L (ref 134–144)
Total Protein: 6.7 g/dL (ref 6.0–8.5)
eGFR: 68 mL/min/{1.73_m2} (ref >59)

## 2022-12-11 LAB — LIPID PANEL
Chol/HDL Ratio: 3.8 ratio (ref 0.0–4.4)
Cholesterol, Total: 183 mg/dL (ref 100–199)
HDL: 48 mg/dL (ref >39)
LDL Chol Calc (NIH): 115 mg/dL — ABNORMAL HIGH (ref 0–99)
Triglycerides: 111 mg/dL (ref 0–149)
VLDL Cholesterol Cal: 20 mg/dL (ref 5–40)

## 2022-12-31 ENCOUNTER — Telehealth: Payer: Self-pay | Admitting: Family Medicine

## 2022-12-31 NOTE — Telephone Encounter (Signed)
Contacted Zhuri Gaige to schedule their annual wellness visit. Appointment made for 01/07/2023.  Thank you,  [place] Direct dial  [phone]

## 2023-01-06 ENCOUNTER — Other Ambulatory Visit: Payer: Self-pay | Admitting: Family Medicine

## 2023-01-06 DIAGNOSIS — R112 Nausea with vomiting, unspecified: Secondary | ICD-10-CM

## 2023-01-06 NOTE — Telephone Encounter (Signed)
Please advise KH 

## 2023-01-07 ENCOUNTER — Ambulatory Visit: Payer: Medicare Other

## 2023-01-07 VITALS — Ht 61.5 in | Wt 263.0 lb

## 2023-01-07 DIAGNOSIS — Z0000 Encounter for general adult medical examination without abnormal findings: Secondary | ICD-10-CM | POA: Diagnosis not present

## 2023-01-07 NOTE — Progress Notes (Signed)
Subjective:   Sherry Hall is a 42 y.o. female who presents for Medicare Annual (Subsequent) preventive examination.  Review of Systems    Virtual Visit via Telephone Note  I connected with  Sherry Hall on 01/07/23 at  1:00 PM EDT by telephone and verified that I am speaking with the correct person using two identifiers.  Location: Patient: Home Provider: Office Persons participating in the virtual visit: patient/Nurse Health Advisor   I discussed the limitations, risks, security and privacy concerns of performing an evaluation and management service by telephone and the availability of in person appointments. The patient expressed understanding and agreed to proceed.  Interactive audio and video telecommunications were attempted between this nurse and patient, however failed, due to patient having technical difficulties OR patient did not have access to video capability.  We continued and completed visit with audio only.  Some vital signs may be absent or patient reported.   Criselda Peaches, LPN  Cardiac Risk Factors include: advanced age (>40men, >50 women);diabetes mellitus;smoking/ tobacco exposure;hypertension     Objective:    Today's Vitals   01/07/23 1300  Weight: 263 lb (119.3 kg)  Height: 5' 1.5" (1.562 m)   Body mass index is 48.89 kg/m.     01/07/2023    1:09 PM 12/18/2021    4:10 PM 12/11/2020    9:00 PM 01/04/2018   10:01 AM 12/31/2017    3:41 PM 08/20/2017    2:07 PM 07/22/2017   10:26 AM  Advanced Directives  Does Patient Have a Medical Advance Directive? No No No No No No No  Would patient like information on creating a medical advance directive? No - Patient declined No - Patient declined No - Patient declined No - Patient declined       Current Medications (verified) Outpatient Encounter Medications as of 01/07/2023  Medication Sig   fluconazole (DIFLUCAN) 150 MG tablet TAKE ONE TABLET BY MOUTH DAILY (Patient not taking: Reported on  11/03/2022)   Semaglutide, 2 MG/DOSE, 8 MG/3ML SOPN Inject 2 mg as directed once a week.   Accu-Chek FastClix Lancets MISC 1 Units by Does not apply route 4 (four) times daily as needed.   albuterol (VENTOLIN HFA) 108 (90 Base) MCG/ACT inhaler Inhale 2 puffs into the lungs every 4 (four) hours as needed for wheezing or shortness of breath. (Patient not taking: Reported on 11/03/2022)   ALPRAZolam (XANAX) 1 MG tablet Take 1 mg by mouth 2 (two) times daily as needed for anxiety (panic attack).   atorvastatin (LIPITOR) 10 MG tablet TAKE ONE TABLET BY MOUTH DAILY   benzonatate (TESSALON) 100 MG capsule Take 1 capsule (100 mg total) by mouth every 8 (eight) hours. (Patient not taking: Reported on 07/28/2022)   bictegravir-emtricitabine-tenofovir AF (BIKTARVY) 50-200-25 MG TABS tablet Take 1 tablet by mouth daily.   Blood Glucose Monitoring Suppl (ACCU-CHEK GUIDE ME) w/Device KIT 1 Units by Does not apply route 4 (four) times daily as needed.   Blood Pressure Monitoring (BLOOD PRESSURE CUFF) MISC 1 Units by Does not apply route daily. Please dispense on EXTRA LARGE (up to 52cm) blood pressure cuff kit   buPROPion (WELLBUTRIN XL) 300 MG 24 hr tablet Take 300 mg by mouth daily.   busPIRone (BUSPAR) 10 MG tablet Take 10 mg by mouth 3 (three) times daily.   carvedilol (COREG) 25 MG tablet Take 1 tablet (25 mg total) by mouth 2 (two) times daily.   empagliflozin (JARDIANCE) 10 MG TABS tablet Take 1 tablet (10 mg  total) by mouth daily.   furosemide (LASIX) 40 MG tablet Take 1 tablet (40 mg total) by mouth daily as needed.   GLOBAL EASE INJECT PEN NEEDLES 32G X 4 MM MISC USE AS DIRECTED TO ADMINISTER INSULIN THREE TIMES DAILY BEFORE MEALS   glucose blood (ACCU-CHEK GUIDE) test strip Use 3 times a day. Dx code: E11.9   insulin aspart (NOVOLOG) 100 UNIT/ML FlexPen Per sliding scale. Max daily dose 40 units. (Patient not taking: Reported on 01/07/2023)   meclizine (ANTIVERT) 25 MG tablet Take 1 tablet (25 mg total)  by mouth 3 (three) times daily as needed for dizziness. (Patient not taking: Reported on 07/28/2022)   metroNIDAZOLE (METROGEL) 0.75 % gel Apply 1 Application topically 2 (two) times daily.   mupirocin ointment (BACTROBAN) 2 % Apply 1 Application topically 2 (two) times daily. (Patient not taking: Reported on 07/28/2022)   ondansetron (ZOFRAN) 4 MG tablet TAKE ONE TABLET BY MOUTH EVERY 6 HOURS AS NEEDED FOR NAUSEA OR VOMITING   sacubitril-valsartan (ENTRESTO) 97-103 MG Take 1 tablet by mouth 2 (two) times daily.   sertraline (ZOLOFT) 100 MG tablet Take by mouth daily.   spironolactone (ALDACTONE) 25 MG tablet Take 0.5 tablets (12.5 mg total) by mouth daily. TAKE HALF TABLET BY MOUTH AT BEDTIME   sulfamethoxazole-trimethoprim (BACTRIM DS) 800-160 MG tablet Take 1 tablet by mouth 2 (two) times daily. (Patient not taking: Reported on 07/28/2022)   traZODone (DESYREL) 50 MG tablet Take 50-100 mg by mouth at bedtime as needed for sleep.   No facility-administered encounter medications on file as of 01/07/2023.    Allergies (verified) Percocet [oxycodone-acetaminophen] and Latex   History: Past Medical History:  Diagnosis Date   Anemia    H/O   Anxiety    Depression    Diabetes mellitus type 1 ([place])    GERD (gastroesophageal reflux disease)    HIV (human immunodeficiency virus infection) ([place]) 2011   Hypertension    Type 2 diabetes mellitus [facility])    Past Surgical History:  Procedure Laterality Date   CESAREAN SECTION  2001, 2008, 2009, 2011   CHOLECYSTECTOMY     EXCISION OF BREAST LESION N/A 01/04/2018   Procedure: EXCISION OF CHEST WALL ABSCESS. MIDDLE CHEST;  Surgeon: Olean Ree, MD;  Location: [facility] ORS;  Service: General;  Laterality: N/A;  resection of chest wall cyst / abscess   FRACTURE SURGERY Right    HAND   TONSILLECTOMY     TUBAL LIGATION  2011   Family History  Problem Relation Age of Onset   Diabetes Father    Hypertension Father    Cancer Mother        cervical     Social History   Socioeconomic History   Marital status: Single    Spouse name: Not on file   Number of children: Not on file   Years of education: Not on file   Highest education level: Not on file  Occupational History   Not on file  Tobacco Use   Smoking status: Every Day    Packs/day: 0.50    Years: 15.00    Additional pack years: 0.00    Total pack years: 7.50    Types: Cigarettes   Smokeless tobacco: Never   Tobacco comments:    trying to slow, cutting back  Vaping Use   Vaping Use: Never used  Substance and Sexual Activity   Alcohol use: Yes    Alcohol/week: 1.0 standard drink of alcohol    Types: 1 Standard  drinks or equivalent per week    Comment: socially   Drug use: No   Sexual activity: Yes    Partners: Male    Birth control/protection: Surgical    Comment: declined condoms  Other Topics Concern   Not on file  Social History Narrative   Not on file   Social Determinants of Health   Financial Resource Strain: Low Risk  (01/07/2023)   Overall Financial Resource Strain (CARDIA)    Difficulty of Paying Living Expenses: Not hard at all  Food Insecurity: No Food Insecurity (01/07/2023)   Hunger Vital Sign    Worried About Running Out of Food in the Last Year: Never true    [place] in the Last Year: Never true  Transportation Needs: No Transportation Needs (01/07/2023)   PRAPARE - Hydrologist (Medical): No    Lack of Transportation (Non-Medical): No  Physical Activity: Insufficiently Active (01/07/2023)   Exercise Vital Sign    Days of Exercise per Week: 3 days    Minutes of Exercise per Session: 20 min  Stress: No Stress Concern Present (01/07/2023)   [place]    Feeling of Stress : Not at all  Social Connections: Socially Isolated (01/07/2023)   Social Connection and Isolation Panel [NHANES]    Frequency of Communication with Friends and Family: More  than three times a week    Frequency of Social Gatherings with Friends and Family: More than three times a week    Attends Religious Services: Never    Marine scientist or Organizations: No    Attends Music therapist: Never    Marital Status: Never married    Tobacco Counseling Ready to quit: Yes Counseling given: Yes Tobacco comments: trying to slow, cutting back   Clinical Intake:  Pre-visit preparation completed: No  Pain : No/denies pain     BMI - recorded: 48.89 Nutritional Status: BMI > 30  Obese Nutritional Risks: None Diabetes: Yes CBG done?: No Did pt. bring in CBG monitor from home?: No  How often do you need to have someone help you when you read instructions, pamphlets, or other written materials from your doctor or pharmacy?: 1 - Never  Diabetic?  Yes  Interpreter Needed?: NoNutrition Risk Assessment:  Has the patient had any N/V/D within the last 2 months?  No  Does the patient have any non-healing wounds?  No  Has the patient had any unintentional weight loss or weight gain?  No   Diabetes:  Is the patient diabetic?  Yes  If diabetic, was a CBG obtained today?  No  Did the patient bring in their glucometer from home?  No  How often do you monitor your CBG's? Daily.   Financial Strains and Diabetes Management:  Are you having any financial strains with the device, your supplies or your medication? No .  Does the patient want to be seen by Chronic Care Management for management of their diabetes?  No  Would the patient like to be referred to a Nutritionist or for Diabetic Management?  No   Diabetic Exams:  Diabetic Eye Exam: Completed Yes. Overdue for diabetic eye exam. Pt has been advised about the importance in completing this exam. A referral has been placed today. Message sent to referral coordinator for scheduling purposes. Advised pt to expect a call from office referred to regarding appt.  Diabetic Foot Exam: Completed  Yes. Pt has  been advised about the importance in completing this exam. Pt is scheduled for diabetic foot exam on Followed by PCP.    Information entered by :: Rolene Arbour LPN   Activities of Daily Living    01/07/2023    1:07 PM  In your present state of health, do you have any difficulty performing the following activities:  Hearing? 0  Vision? 0  Difficulty concentrating or making decisions? 0  Walking or climbing stairs? 0  Dressing or bathing? 0  Doing errands, shopping? 0  Preparing Food and eating ? N  Using the Toilet? N  In the past six months, have you accidently leaked urine? N  Do you have problems with loss of bowel control? N  Managing your Medications? N  Managing your Finances? N  Housekeeping or managing your Housekeeping? N    Patient Care Team: Dorena Dew, FNP as PCP - General (Family Medicine) O'Neal, Cassie Freer, MD as PCP - Cardiology (Cardiology) Canova Callas, NP as Nurse Practitioner (Infectious Diseases)  Indicate any recent Medical Services you may have received from other than [facility] providers in the past year (date may be approximate).     Assessment:   This is a routine wellness examination for Sherry Hall.  Hearing/Vision screen Hearing Screening - Comments:: Denies hearing difficulties   Vision Screening - Comments:: Wears Contacts - up to date with routine eye exams with  Patient deferred  Dietary issues and exercise activities discussed: Exercise limited by: None identified   Goals Addressed               This Visit's Progress     Lose weight (pt-stated)         Depression Screen    01/07/2023    1:06 PM 11/03/2022   11:51 AM 05/12/2022   10:08 AM 01/16/2022    2:53 PM 12/18/2021    3:31 PM 08/11/2021   10:38 AM 07/18/2021    9:40 AM  PHQ 2/9 Scores  PHQ - 2 Score 0 2 0 1 3 2 6   PHQ- 9 Score  13   15 11 15     Fall Risk    01/07/2023    1:08 PM 07/28/2022   11:53 AM 05/12/2022   10:08 AM 01/16/2022    2:52 PM  12/18/2021    4:16 PM  Fall Risk   Falls in the past year? 0 0 0 0 0  Number falls in past yr: 0 0 0  0  Injury with Fall? 0 0 0  0  Risk for fall due to : No Fall Risks   No Fall Risks No Fall Risks  Follow up Falls prevention discussed Falls evaluation completed  Falls evaluation completed     [place]:  Any stairs in or around the home? Yes  If so, are there any without handrails? No  Home free of loose throw rugs in walkways, pet beds, electrical cords, etc? Yes  Adequate lighting in your home to reduce risk of falls? Yes   ASSISTIVE DEVICES UTILIZED TO PREVENT FALLS:  Life alert? No  Use of a cane, walker or w/c? No  Grab bars in the bathroom? No  Shower chair or bench in shower? No  Elevated toilet seat or a handicapped toilet? No   TIMED UP AND GO:  Was the test performed? No . Audio Visit  Cognitive Function:        01/07/2023    1:09 PM 12/18/2021  3:23 PM  6CIT Screen  What Year? 0 points 0 points  What month? 0 points 0 points  What time? 0 points 0 points  Count back from 20 0 points 0 points  Months in reverse 0 points 0 points  Repeat phrase 0 points 0 points  Total Score 0 points 0 points    Immunizations Immunization History  Administered Date(s) Administered   Hep A / Hep B 01/28/2010, 03/11/2010   Hepatitis A, Adult 01/28/2010, 03/11/2010   Hepatitis B 01/28/2010, 03/11/2010   Influenza,inj,Quad PF,6+ Mos 09/16/2015, 08/10/2019   Influenza-Unspecified 11/16/2011, 10/20/2012   PPD Test 01/28/2010, 11/16/2011, 01/11/2013   Pneumococcal Conjugate-13 10/20/2012, 01/03/2016   Pneumococcal Polysaccharide-23 02/04/2010, 10/20/2012   Tdap 01/11/2013    TDAP status: Up to date  Flu Vaccine status: Declined, Education has been provided regarding the importance of this vaccine but patient still declined. Advised may receive this vaccine at local pharmacy or Health Dept. Aware to provide a copy of the vaccination  record if obtained from local pharmacy or Health Dept. Verbalized acceptance and understanding.    Covid-19 vaccine status: Declined, Education has been provided regarding the importance of this vaccine but patient still declined. Advised may receive this vaccine at local pharmacy or Health Dept.or vaccine clinic. Aware to provide a copy of the vaccination record if obtained from local pharmacy or Health Dept. Verbalized acceptance and understanding.  Qualifies for Shingles Vaccine? No   Zostavax completed No   Shingrix Completed?: No.    Education has been provided regarding the importance of this vaccine. Patient has been advised to call insurance company to determine out of pocket expense if they have not yet received this vaccine. Advised may also receive vaccine at local pharmacy or Health Dept. Verbalized acceptance and understanding.  Screening Tests Health Maintenance  Topic Date Due   INFLUENZA VACCINE  01/17/2023 (Originally 05/19/2022)   COVID-19 Vaccine (1) 01/23/2023 (Originally 08/29/1984)   Hepatitis C Screening  01/07/2024 (Originally 08/29/1997)   DTaP/Tdap/Td (2 - Td or Tdap) 01/12/2023   HEMOGLOBIN A1C  05/04/2023   FOOT EXAM  07/29/2023   Diabetic kidney evaluation - Urine ACR  11/04/2023   Diabetic kidney evaluation - eGFR measurement  12/11/2023   Medicare Annual Wellness (AWV)  01/07/2024   PAP SMEAR-Modifier  05/12/2025   HIV Screening  Completed   HPV VACCINES  Aged Out   OPHTHALMOLOGY EXAM  Discontinued    Health Maintenance  There are no preventive care reminders to display for this patient.    Lung Cancer Screening: (Low Dose CT Chest recommended if Age 67-80 years, 30 pack-year currently smoking OR have quit w/in 15years.) does qualify.   Lung Cancer Screening Referral: Deferred  Additional Screening:  Hepatitis C Screening: does qualify; Completed Deferred  Vision Screening: Recommended annual ophthalmology exams for early detection of glaucoma  and other disorders of the eye. Is the patient up to date with their annual eye exam?  No  Who is the provider or what is the name of the office in which the patient attends annual eye exams? Deferred If pt is not established with a provider, would they like to be referred to a provider to establish care? No .   Dental Screening: Recommended annual dental exams for proper oral hygiene  Community Resource Referral / Chronic Care Management:  CRR required this visit?  No   CCM required this visit?  No      Plan:     I have personally reviewed and  noted the following in the patient's chart:   Medical and social history Use of alcohol, tobacco or illicit drugs  Current medications and supplements including opioid prescriptions. Patient is not currently taking opioid prescriptions. Functional ability and status Nutritional status Physical activity Advanced directives List of other physicians Hospitalizations, surgeries, and ER visits in previous 12 months Vitals Screenings to include cognitive, depression, and falls Referrals and appointments  In addition, I have reviewed and discussed with patient certain preventive protocols, quality metrics, and best practice recommendations. A written personalized care plan for preventive services as well as general preventive health recommendations were provided to patient.     Criselda Peaches, LPN   [id number]   Nurse Notes: Patient due Hep-C Screening. Patient request f/u with concerns of numbness in legs, feet and hands.

## 2023-01-07 NOTE — Patient Instructions (Addendum)
Sherry Hall , Thank you for taking time to come for your Medicare Wellness Visit. I appreciate your ongoing commitment to your health goals. Please review the following plan we discussed and let me know if I can assist you in the future.   These are the goals we discussed:  Goals       Lose weight (pt-stated)      Quit smoking / using tobacco      Pt states she is still working on her goal of quitting tobacco use        This is a list of the screening recommended for you and due dates:  Health Maintenance  Topic Date Due   Flu Shot  01/17/2023*   COVID-19 Vaccine (1) 01/23/2023*   Hepatitis C Screening: USPSTF Recommendation to screen - Ages 18-79 yo.  01/07/2024*   DTaP/Tdap/Td vaccine (2 - Td or Tdap) 01/12/2023   Hemoglobin A1C  05/04/2023   Complete foot exam   07/29/2023   Yearly kidney health urinalysis for diabetes  11/04/2023   Yearly kidney function blood test for diabetes  12/11/2023   Medicare Annual Wellness Visit  01/07/2024   Pap Smear  05/12/2025   HIV Screening  Completed   HPV Vaccine  Aged Out   Eye exam for diabetics  Discontinued  *Topic was postponed. The date shown is not the original due date.    Advanced directives: Advance directive discussed with you today. Even though you declined this today, please call our office should you change your mind, and we can give you the proper paperwork for you to fill out.   Conditions/risks identified: None  Next appointment: Follow up in one year for your annual wellness visit.    Preventive Care 40-64 Years, Female Preventive care refers to lifestyle choices and visits with your health care provider that can promote health and wellness. What does preventive care include? A yearly physical exam. This is also called an annual well check. Dental exams once or twice a year. Routine eye exams. Ask your health care provider how often you should have your eyes checked. Personal lifestyle choices, including: Daily  care of your teeth and gums. Regular physical activity. Eating a healthy diet. Avoiding tobacco and drug use. Limiting alcohol use. Practicing safe sex. Taking low-dose aspirin daily starting at age 37. Taking vitamin and mineral supplements as recommended by your health care provider. What happens during an annual well check? The services and screenings done by your health care provider during your annual well check will depend on your age, overall health, lifestyle risk factors, and family history of disease. Counseling  Your health care provider may ask you questions about your: Alcohol use. Tobacco use. Drug use. Emotional well-being. Home and relationship well-being. Sexual activity. Eating habits. Work and work Statistician. Method of birth control. Menstrual cycle. Pregnancy history. Screening  You may have the following tests or measurements: Height, weight, and BMI. Blood pressure. Lipid and cholesterol levels. These may be checked every 5 years, or more frequently if you are over 39 years old. Skin check. Lung cancer screening. You may have this screening every year starting at age 49 if you have a 30-pack-year history of smoking and currently smoke or have quit within the past 15 years. Fecal occult blood test (FOBT) of the stool. You may have this test every year starting at age 77. Flexible sigmoidoscopy or colonoscopy. You may have a sigmoidoscopy every 5 years or a colonoscopy every 10 years starting at age  50. Hepatitis C blood test. Hepatitis B blood test. Sexually transmitted disease (STD) testing. Diabetes screening. This is done by checking your blood sugar (glucose) after you have not eaten for a while (fasting). You may have this done every 1-3 years. Mammogram. This may be done every 1-2 years. Talk to your health care provider about when you should start having regular mammograms. This may depend on whether you have a family history of breast  cancer. BRCA-related cancer screening. This may be done if you have a family history of breast, ovarian, tubal, or peritoneal cancers. Pelvic exam and Pap test. This may be done every 3 years starting at age 76. Starting at age 24, this may be done every 5 years if you have a Pap test in combination with an HPV test. Bone density scan. This is done to screen for osteoporosis. You may have this scan if you are at high risk for osteoporosis. Discuss your test results, treatment options, and if necessary, the need for more tests with your health care provider. Vaccines  Your health care provider may recommend certain vaccines, such as: Influenza vaccine. This is recommended every year. Tetanus, diphtheria, and acellular pertussis (Tdap, Td) vaccine. You may need a Td booster every 10 years. Zoster vaccine. You may need this after age 58. Pneumococcal 13-valent conjugate (PCV13) vaccine. You may need this if you have certain conditions and were not previously vaccinated. Pneumococcal polysaccharide (PPSV23) vaccine. You may need one or two doses if you smoke cigarettes or if you have certain conditions. Talk to your health care provider about which screenings and vaccines you need and how often you need them. This information is not intended to replace advice given to you by your health care provider. Make sure you discuss any questions you have with your health care provider. Document Released: 11/01/2015 Document Revised: 06/24/2016 Document Reviewed: 08/06/2015 Elsevier Interactive Patient Education  2017 [place] Prevention in the Home Falls can cause injuries. They can happen to people of all ages. There are many things you can do to make your home safe and to help prevent falls. What can I do on the outside of my home? Regularly fix the edges of walkways and driveways and fix any cracks. Remove anything that might make you trip as you walk through a door, such as a raised step  or threshold. Trim any bushes or trees on the path to your home. Use bright outdoor lighting. Clear any walking paths of anything that might make someone trip, such as rocks or tools. Regularly check to see if handrails are loose or broken. Make sure that both sides of any steps have handrails. Any raised decks and porches should have guardrails on the edges. Have any leaves, snow, or ice cleared regularly. Use sand or salt on walking paths during winter. Clean up any spills in your garage right away. This includes oil or grease spills. What can I do in the bathroom? Use night lights. Install grab bars by the toilet and in the tub and shower. Do not use towel bars as grab bars. Use non-skid mats or decals in the tub or shower. If you need to sit down in the shower, use a plastic, non-slip stool. Keep the floor dry. Clean up any water that spills on the floor as soon as it happens. Remove soap buildup in the tub or shower regularly. Attach bath mats securely with double-sided non-slip rug tape. Do not have throw rugs and other  things on the floor that can make you trip. What can I do in the bedroom? Use night lights. Make sure that you have a light by your bed that is easy to reach. Do not use any sheets or blankets that are too big for your bed. They should not hang down onto the floor. Have a firm chair that has side arms. You can use this for support while you get dressed. Do not have throw rugs and other things on the floor that can make you trip. What can I do in the kitchen? Clean up any spills right away. Avoid walking on wet floors. Keep items that you use a lot in easy-to-reach places. If you need to reach something above you, use a strong step stool that has a grab bar. Keep electrical cords out of the way. Do not use floor polish or wax that makes floors slippery. If you must use wax, use non-skid floor wax. Do not have throw rugs and other things on the floor that can make  you trip. What can I do with my stairs? Do not leave any items on the stairs. Make sure that there are handrails on both sides of the stairs and use them. Fix handrails that are broken or loose. Make sure that handrails are as long as the stairways. Check any carpeting to make sure that it is firmly attached to the stairs. Fix any carpet that is loose or worn. Avoid having throw rugs at the top or bottom of the stairs. If you do have throw rugs, attach them to the floor with carpet tape. Make sure that you have a light switch at the top of the stairs and the bottom of the stairs. If you do not have them, ask someone to add them for you. What else can I do to help prevent falls? Wear shoes that: Do not have high heels. Have rubber bottoms. Are comfortable and fit you well. Are closed at the toe. Do not wear sandals. If you use a stepladder: Make sure that it is fully opened. Do not climb a closed stepladder. Make sure that both sides of the stepladder are locked into place. Ask someone to hold it for you, if possible. Clearly mark and make sure that you can see: Any grab bars or handrails. First and last steps. Where the edge of each step is. Use tools that help you move around (mobility aids) if they are needed. These include: Canes. Walkers. Scooters. Crutches. Turn on the lights when you go into a dark area. Replace any light bulbs as soon as they burn out. Set up your furniture so you have a clear path. Avoid moving your furniture around. If any of your floors are uneven, fix them. If there are any pets around you, be aware of where they are. Review your medicines with your doctor. Some medicines can make you feel dizzy. This can increase your chance of falling. Ask your doctor what other things that you can do to help prevent falls. This information is not intended to replace advice given to you by your health care provider. Make sure you discuss any questions you have with your  health care provider. Document Released: 08/01/2009 Document Revised: 03/12/2016 Document Reviewed: 11/09/2014 Elsevier Interactive Patient Education  2017 [employer].

## 2023-02-09 ENCOUNTER — Encounter: Payer: Self-pay | Admitting: Family Medicine

## 2023-02-09 ENCOUNTER — Ambulatory Visit: Payer: Medicare Other | Admitting: Family Medicine

## 2023-02-09 VITALS — BP 131/65 | HR 87 | Temp 97.4°F | Ht 60.0 in | Wt 245.8 lb

## 2023-02-09 DIAGNOSIS — Z794 Long term (current) use of insulin: Secondary | ICD-10-CM

## 2023-02-09 DIAGNOSIS — E8881 Metabolic syndrome: Secondary | ICD-10-CM | POA: Diagnosis not present

## 2023-02-09 DIAGNOSIS — I10 Essential (primary) hypertension: Secondary | ICD-10-CM | POA: Diagnosis not present

## 2023-02-09 DIAGNOSIS — E118 Type 2 diabetes mellitus with unspecified complications: Secondary | ICD-10-CM | POA: Diagnosis not present

## 2023-02-09 LAB — POCT GLYCOSYLATED HEMOGLOBIN (HGB A1C): Hemoglobin A1C: 5.3 % (ref 4.0–5.6)

## 2023-02-09 NOTE — Patient Instructions (Addendum)
Check blood sugars frequently for hypoglycemia. You have lossed 18 pounds since your last visit.  Recommend B12 for more energy

## 2023-02-09 NOTE — Progress Notes (Signed)
Established Patient Office Visit  Subjective   Patient ID: Sherry Hall, female    DOB: 27-Jun-1981  Age: 42 y.o. MRN: [id number]  Chief Complaint  Patient presents with   Diabetes    Follow up    Sherry Hall is a 42 year old female with a medical history significant for type 2 diabetes mellitus, HIV, obesity, hypertension, and hyperlipidemia presents for follow-up of her chronic conditions.  Patient says that she has been having a little fatigue lately.  She says that she has been extremely tired at the end of the day.  She has been taking all prescribed medications consistently.  Patient has been following to hydrate modified diet.  She is not exercising consistently. She denies any polyuria, polydipsia, or probably major.  No dizziness headache, or blurred vision. Patient's blood pressure has been controlled on current medication regimen.  She does not check her blood pressure at home.  She does not have any lower extremity swelling, chest pain, or heart palpitations today. Of note, patient has decreased her weight by 18 pounds since last visit.    Patient Active Problem List   Diagnosis Date Noted   Stress incontinence of urine 05/12/2022   Boil 05/12/2022   Screening for cervical cancer 03/17/2022   Amenorrhea 01/16/2022   Seasonal allergies 01/16/2022   Syphilis 08/11/2021   Chronic systolic congestive heart failure (HCC) 01/03/2021   Bilateral hand numbness 01/03/2021   Tobacco dependence 10/20/2016   Type 1 diabetes mellitus with hyperglycemia (HCC) 01/03/2016   HTN (hypertension) 09/16/2015   HIV disease (HCC) 09/16/2015   Depression 09/16/2015   Numbness and tingling of both legs below knees 06/05/2015   Hypertension due to endocrine disorder 12/28/2014   Insomnia 04/04/2012   Microalbuminuria 04/04/2012   Hyperlipidemia with target LDL less than 100 03/21/2012   Obesity 03/21/2012   Posttraumatic stress disorder 10/01/2011   Anxiety disorder  07/28/2011   Past Medical History:  Diagnosis Date   Anemia    H/O   Anxiety    Depression    Diabetes mellitus type 1 (HCC)    GERD (gastroesophageal reflux disease)    HIV (human immunodeficiency virus infection) (HCC) 2011   Hypertension    Type 2 diabetes mellitus (HCC)    Past Surgical History:  Procedure Laterality Date   CESAREAN SECTION  2001, 2008, 2009, 2011   CHOLECYSTECTOMY     EXCISION OF BREAST LESION N/A 01/04/2018   Procedure: EXCISION OF CHEST WALL ABSCESS. MIDDLE CHEST;  Surgeon: Henrene Dodge, MD;  Location: [facility] ORS;  Service: General;  Laterality: N/A;  resection of chest wall cyst / abscess   FRACTURE SURGERY Right    HAND   TONSILLECTOMY     TUBAL LIGATION  2011   Social History   Tobacco Use   Smoking status: Every Day    Packs/day: 0.50    Years: 15.00    Additional pack years: 0.00    Total pack years: 7.50    Types: Cigarettes   Smokeless tobacco: Never   Tobacco comments:    trying to slow, cutting back  Vaping Use   Vaping Use: Never used  Substance Use Topics   Alcohol use: Yes    Alcohol/week: 1.0 standard drink of alcohol    Types: 1 Standard drinks or equivalent per week    Comment: socially   Drug use: No   Social History   Socioeconomic History   Marital status: Single    Spouse name: Not on file  Number of children: Not on file   Years of education: Not on file   Highest education level: Not on file  Occupational History   Not on file  Tobacco Use   Smoking status: Every Day    Packs/day: 0.50    Years: 15.00    Additional pack years: 0.00    Total pack years: 7.50    Types: Cigarettes   Smokeless tobacco: Never   Tobacco comments:    trying to slow, cutting back  Vaping Use   Vaping Use: Never used  Substance and Sexual Activity   Alcohol use: Yes    Alcohol/week: 1.0 standard drink of alcohol    Types: 1 Standard drinks or equivalent per week    Comment: socially   Drug use: No   Sexual activity: Yes     Partners: Male    Birth control/protection: Surgical    Comment: declined condoms  Other Topics Concern   Not on file  Social History Narrative   Not on file   Social Determinants of Health   Financial Resource Strain: Low Risk  (01/07/2023)   Overall Financial Resource Strain (CARDIA)    Difficulty of Paying Living Expenses: Not hard at all  Food Insecurity: No Food Insecurity (01/07/2023)   Hunger Vital Sign    Worried About Running Out of Food in the Last Year: Never true    Ran Out of Food in the Last Year: Never true  Transportation Needs: No Transportation Needs (01/07/2023)   PRAPARE - Administrator, Civil Service (Medical): No    Lack of Transportation (Non-Medical): No  Physical Activity: Insufficiently Active (01/07/2023)   Exercise Vital Sign    Days of Exercise per Week: 3 days    Minutes of Exercise per Session: 20 min  Stress: No Stress Concern Present (01/07/2023)   [employer] of Occupational Health - Occupational Stress Questionnaire    Feeling of Stress : Not at all  Social Connections: Socially Isolated (01/07/2023)   Social Connection and Isolation Panel [NHANES]    Frequency of Communication with Friends and Family: More than three times a week    Frequency of Social Gatherings with Friends and Family: More than three times a week    Attends Religious Services: Never    Database administrator or Organizations: No    Attends Banker Meetings: Never    Marital Status: Never married  Intimate Partner Violence: Not At Risk (01/07/2023)   Humiliation, Afraid, Rape, and Kick questionnaire    Fear of Current or Ex-Partner: No    Emotionally Abused: No    Physically Abused: No    Sexually Abused: No   Family Status  Relation Name Status   Father  (Not Specified)   Mother  (Not Specified)   Family History  Problem Relation Age of Onset   Diabetes Father    Hypertension Father    Cancer Mother        cervical    Allergies   Allergen Reactions   Percocet [Oxycodone-Acetaminophen] Hives, Itching and Rash   Latex Rash      Review of Systems  Constitutional:  Positive for malaise/fatigue.  HENT: Negative.    Respiratory: Negative.    Cardiovascular: Negative.   Gastrointestinal: Negative.   Genitourinary: Negative.   Musculoskeletal:  Positive for back pain.  Skin: Negative.   Neurological: Negative.  Negative for dizziness.  Endo/Heme/Allergies:  Negative for polydipsia.  Psychiatric/Behavioral: Negative.  Objective:     BP 131/65   Pulse 87   Temp (!) 97.4 F (36.3 C)   Ht 5' (1.524 m)   Wt 245 lb 12.8 oz (111.5 kg)   SpO2 100%   BMI 48.00 kg/m  BP Readings from Last 3 Encounters:  02/09/23 131/65  11/03/22 (!) 178/92  07/28/22 (!) 137/57   Wt Readings from Last 3 Encounters:  02/09/23 245 lb 12.8 oz (111.5 kg)  01/07/23 263 lb (119.3 kg)  11/03/22 263 lb (119.3 kg)      Physical Exam Constitutional:      Appearance: She is obese.  Cardiovascular:     Rate and Rhythm: Normal rate and regular rhythm.     Pulses: Normal pulses.  Pulmonary:     Effort: Pulmonary effort is normal.  Abdominal:     General: Bowel sounds are normal.  Musculoskeletal:        General: Normal range of motion.  Skin:    General: Skin is warm.  Neurological:     General: No focal deficit present.     Mental Status: Mental status is at baseline.  Psychiatric:        Mood and Affect: Mood normal.        Thought Content: Thought content normal.        Judgment: Judgment normal.      Results for orders placed or performed in visit on 02/09/23  Comprehensive metabolic panel  Result Value Ref Range   Glucose 94 70 - 99 mg/dL   BUN 12 6 - 24 mg/dL   Creatinine, Ser 4.09 0.57 - 1.00 mg/dL   eGFR 75 >81 XB/JYN/8.29   BUN/Creatinine Ratio 13 9 - 23   Sodium 139 134 - 144 mmol/L   Potassium 4.2 3.5 - 5.2 mmol/L   Chloride 106 96 - 106 mmol/L   CO2 22 20 - 29 mmol/L   Calcium 9.0 8.7 -  10.2 mg/dL   Total Protein 6.9 6.0 - 8.5 g/dL   Albumin 3.7 (L) 3.9 - 4.9 g/dL   Globulin, Total 3.2 1.5 - 4.5 g/dL   Albumin/Globulin Ratio 1.2 1.2 - 2.2   Bilirubin Total 0.2 0.0 - 1.2 mg/dL   Alkaline Phosphatase 74 44 - 121 IU/L   AST 12 0 - 40 IU/L   ALT 11 0 - 32 IU/L  POCT glycosylated hemoglobin (Hb A1C)  Result Value Ref Range   Hemoglobin A1C 5.3 4.0 - 5.6 %   HbA1c POC (<> result, manual entry)     HbA1c, POC (prediabetic range)     HbA1c, POC (controlled diabetic range)      Last CBC Lab Results  Component Value Date   WBC 10.5 03/26/2022   HGB 13.5 03/26/2022   HCT 39.4 03/26/2022   MCV 94.0 03/26/2022   MCH 32.2 03/26/2022   RDW 12.5 03/26/2022   PLT 248 03/26/2022   Last metabolic panel Lab Results  Component Value Date   GLUCOSE 94 02/09/2023   NA 139 02/09/2023   K 4.2 02/09/2023   CL 106 02/09/2023   CO2 22 02/09/2023   BUN 12 02/09/2023   CREATININE 0.96 02/09/2023   EGFR 75 02/09/2023   CALCIUM 9.0 02/09/2023   PROT 6.9 02/09/2023   ALBUMIN 3.7 (L) 02/09/2023   LABGLOB 3.2 02/09/2023   AGRATIO 1.2 02/09/2023   BILITOT 0.2 02/09/2023   ALKPHOS 74 02/09/2023   AST 12 02/09/2023   ALT 11 02/09/2023   ANIONGAP 14 03/26/2022  Last lipids Lab Results  Component Value Date   CHOL 183 12/10/2022   HDL 48 12/10/2022   LDLCALC 115 (H) 12/10/2022   TRIG 111 12/10/2022   CHOLHDL 3.8 12/10/2022   Last hemoglobin A1c Lab Results  Component Value Date   HGBA1C 5.3 02/09/2023   Last thyroid functions Lab Results  Component Value Date   TSH 1.42 07/08/2016   Last vitamin D No results found for: "25OHVITD2", "25OHVITD3", "VD25OH" Last vitamin B12 and Folate Lab Results  Component Value Date   VITAMINB12 501 01/27/2021      The 10-year ASCVD risk score (Arnett DK, et al., 2019) is: 12.1%    Assessment & Plan:   Problem List Items Addressed This Visit   None Visit Diagnoses     Type 2 diabetes mellitus with complication, with  long-term current use of insulin (HCC)    -  Primary   Relevant Orders   POCT glycosylated hemoglobin (Hb A1C) (Completed)   Comprehensive metabolic panel (Completed)   Essential hypertension       Relevant Orders   Comprehensive metabolic panel (Completed)   Metabolic syndrome          1. Type 2 diabetes mellitus with complication, with long-term current use of insulin (HCC) Patient's hemoglobin A1c is decreased to 5.7.  No medication changes at this time. - POCT glycosylated hemoglobin (Hb A1C) - Comprehensive metabolic panel  2. Essential hypertension BP 131/65   Pulse 87   Temp (!) 97.4 F (36.3 C)   Ht 5' (1.524 m)   Wt 245 lb 12.8 oz (111.5 kg)   SpO2 100%   BMI 48.00 kg/m   - Comprehensive metabolic panel  3. Metabolic syndrome The patient is asked to make an attempt to improve diet and exercise patterns to aid in medical management of this problem.  Return in about 6 months (around 08/11/2023).   Nolon Nations  APRN, MSN, FNP-C Patient Care [facility] [address] [place], [place] [address] [phone]

## 2023-02-10 LAB — COMPREHENSIVE METABOLIC PANEL
ALT: 11 IU/L (ref 0–32)
AST: 12 IU/L (ref 0–40)
Albumin/Globulin Ratio: 1.2 (ref 1.2–2.2)
Albumin: 3.7 g/dL — ABNORMAL LOW (ref 3.9–4.9)
Alkaline Phosphatase: 74 IU/L (ref 44–121)
BUN/Creatinine Ratio: 13 (ref 9–23)
BUN: 12 mg/dL (ref 6–24)
Bilirubin Total: 0.2 mg/dL (ref 0.0–1.2)
CO2: 22 mmol/L (ref 20–29)
Calcium: 9 mg/dL (ref 8.7–10.2)
Chloride: 106 mmol/L (ref 96–106)
Creatinine, Ser: 0.96 mg/dL (ref 0.57–1.00)
Globulin, Total: 3.2 g/dL (ref 1.5–4.5)
Glucose: 94 mg/dL (ref 70–99)
Potassium: 4.2 mmol/L (ref 3.5–5.2)
Sodium: 139 mmol/L (ref 134–144)
Total Protein: 6.9 g/dL (ref 6.0–8.5)
eGFR: 75 mL/min/{1.73_m2} (ref >59)

## 2023-02-12 ENCOUNTER — Other Ambulatory Visit: Payer: Self-pay | Admitting: Family Medicine

## 2023-02-12 DIAGNOSIS — R112 Nausea with vomiting, unspecified: Secondary | ICD-10-CM

## 2023-02-12 NOTE — Telephone Encounter (Signed)
Please advise KH 

## 2023-02-15 ENCOUNTER — Other Ambulatory Visit: Payer: Self-pay | Admitting: Cardiovascular Disease

## 2023-02-15 DIAGNOSIS — E785 Hyperlipidemia, unspecified: Secondary | ICD-10-CM

## 2023-04-06 ENCOUNTER — Other Ambulatory Visit: Payer: Self-pay | Admitting: Family Medicine

## 2023-04-06 DIAGNOSIS — R112 Nausea with vomiting, unspecified: Secondary | ICD-10-CM

## 2023-04-06 NOTE — Telephone Encounter (Signed)
Please advise KH 

## 2023-04-15 ENCOUNTER — Other Ambulatory Visit: Payer: Self-pay | Admitting: Cardiovascular Disease

## 2023-04-15 DIAGNOSIS — E785 Hyperlipidemia, unspecified: Secondary | ICD-10-CM

## 2023-05-21 ENCOUNTER — Other Ambulatory Visit: Payer: Self-pay | Admitting: Family Medicine

## 2023-05-21 DIAGNOSIS — R112 Nausea with vomiting, unspecified: Secondary | ICD-10-CM

## 2023-05-21 NOTE — Telephone Encounter (Signed)
Please advise Kh 

## 2023-06-02 ENCOUNTER — Telehealth: Payer: Self-pay | Admitting: Family Medicine

## 2023-06-02 NOTE — Telephone Encounter (Signed)
Pt. LVM on nurse line 06/01/23 @ 3:56pm requesting a callback from the nurse to speak to her about Ozempic.  Her next appt is in October, but she wants to talk to the nurse or provider about this medication or she would like an appt to discuss the medication.

## 2023-07-05 ENCOUNTER — Other Ambulatory Visit: Payer: Self-pay | Admitting: Family Medicine

## 2023-07-05 DIAGNOSIS — R112 Nausea with vomiting, unspecified: Secondary | ICD-10-CM

## 2023-07-05 NOTE — Telephone Encounter (Signed)
Please advise Kh

## 2023-07-13 ENCOUNTER — Other Ambulatory Visit: Payer: Self-pay | Admitting: Cardiovascular Disease

## 2023-07-13 DIAGNOSIS — E785 Hyperlipidemia, unspecified: Secondary | ICD-10-CM

## 2023-07-15 ENCOUNTER — Other Ambulatory Visit: Payer: Self-pay

## 2023-07-15 ENCOUNTER — Encounter: Payer: Self-pay | Admitting: Infectious Diseases

## 2023-07-15 ENCOUNTER — Ambulatory Visit (INDEPENDENT_AMBULATORY_CARE_PROVIDER_SITE_OTHER): Payer: Medicare Other | Admitting: Infectious Diseases

## 2023-07-15 VITALS — BP 122/82 | HR 85 | Temp 98.0°F | Ht 60.0 in | Wt 233.0 lb

## 2023-07-15 DIAGNOSIS — B20 Human immunodeficiency virus [HIV] disease: Secondary | ICD-10-CM | POA: Diagnosis present

## 2023-07-15 DIAGNOSIS — Z6841 Body Mass Index (BMI) 40.0 and over, adult: Secondary | ICD-10-CM

## 2023-07-15 DIAGNOSIS — Z8619 Personal history of other infectious and parasitic diseases: Secondary | ICD-10-CM | POA: Diagnosis not present

## 2023-07-15 DIAGNOSIS — A539 Syphilis, unspecified: Secondary | ICD-10-CM | POA: Diagnosis not present

## 2023-07-15 DIAGNOSIS — Z23 Encounter for immunization: Secondary | ICD-10-CM

## 2023-07-15 MED ORDER — BIKTARVY 50-200-25 MG PO TABS: 1.0000 | ORAL_TABLET | Freq: Every day | ORAL | 11 refills | Status: DC

## 2023-07-15 NOTE — Patient Instructions (Addendum)
Always nice to see you!  Ask your provider about switching to [facility] - you may have less GI side effects on that.

## 2023-07-15 NOTE — Assessment & Plan Note (Signed)
LOV in July 2023 with undetectable VL and healthy CD4 > 500.  Very well controlled on once daily biktarvy. No concerns with access or adherence to medication. They are tolerating the medication well without side effects. No drug interactions identified. Pertinent lab tests ordered today.  No changes to insurance coverage.  No dental needs today.  No concern over anxious/depressed mood.  Sexual health and family planning discussed - no needs today - will set up soon for pap smear.  Vaccines updated today - see health maintenance section.    Return in about 6 months (around 01/12/2024).

## 2023-07-15 NOTE — Assessment & Plan Note (Signed)
She has done well with ozempic but struggles with pretty much every side effect listed. I wonder if she would have better result from [facility] and I encouraged her to talk with her PCP about this.

## 2023-07-15 NOTE — Progress Notes (Signed)
Name: Sherry Hall  ZOX:[id number]   DOB: [birth year]   PCP: Massie Maroon, FNP    Brief History ID: Sherry Hall is a 42 y.o. female with HIV disease. Dx 2011. Intermittently on medications. Co morbidities: T1DM, HTN, Hyperlipidemia, obesity. HIV Risk: heterosexual Hx: OIs: none known  Hep B sAg (not on file); Hep BsAb (neg); Hep B cAb (not on file); Hep C (not on file); Hep A (not on file); HLA B*5701 (-); Quantiferon (-)  Previous Regimens: Complera Odefsey Biktarvy    Genotypes: 2017 - no significant mutations PR/RT   Chief Complaint  Patient presents with   Follow-up      HPI:   Sherry Hall is here today for follow up.   Lost at least 56 lbs. Started ozempic - she is off all insulin and feels better with more energy and less aches/pains and limitations. She has kind of plateaued with weight loss. Still having some side effects that affect. Struggles with heartburn, nausea, vomiting and constipation. Massive headache with first two days of injection and fatigue.   She has continued her biktarvy everyday with good adherence. No trouble with access from pharmacy.   Review of Systems  All other systems reviewed and are negative.     Observations/Objective: Today's Vitals   07/15/23 1104  BP: 122/82  Pulse: 85  Temp: 98 F (36.7 C)  TempSrc: Oral  SpO2: 100%  Weight: 233 lb (105.7 kg)  Height: 5' (1.524 m)  PainSc: 0-No pain    Body mass index is 45.5 kg/m.   Physical Exam Constitutional:      Appearance: Normal appearance. She is not ill-appearing.  HENT:     Mouth/Throat:     Mouth: Mucous membranes are moist.     Pharynx: Oropharynx is clear.  Eyes:     General: No scleral icterus. Cardiovascular:     Rate and Rhythm: Normal rate and regular rhythm.  Pulmonary:     Effort: Pulmonary effort is normal.  Neurological:     Mental Status: She is oriented to person, place, and time.  Psychiatric:        Mood and Affect: Mood  normal.        Thought Content: Thought content normal.      HIV 1 RNA Quant (Copies/mL)  Date Value  05/12/2022 Not Detected  01/16/2022 33 (H)  08/11/2021 38 (H)   CD4 T Cell Abs (/uL)  Date Value  05/12/2022 455  12/12/2020 570  08/10/2019 535    Lab Results  Component Value Date   CREATININE 0.96 02/09/2023   CREATININE 1.05 (H) 12/10/2022   CREATININE 1.74 (H) 07/28/2022    Lab Results  Component Value Date   WBC 10.5 03/26/2022   HGB 13.5 03/26/2022   HCT 39.4 03/26/2022   MCV 94.0 03/26/2022   PLT 248 03/26/2022    Lab Results  Component Value Date   ALT 11 02/09/2023   AST 12 02/09/2023   ALKPHOS 74 02/09/2023   BILITOT 0.2 02/09/2023     Assessment and Plan:  Problem List Items Addressed This Visit       High   HIV disease (HCC) - Primary (Chronic)    LOV in July 2023 with undetectable VL and healthy CD4 > 500.  Very well controlled on once daily biktarvy. No concerns with access or adherence to medication. They are tolerating the medication well without side effects. No drug interactions identified. Pertinent lab tests ordered today.  No changes to insurance coverage.  No dental needs today.  No concern over anxious/depressed mood.  Sexual health and family planning discussed - no needs today - will set up soon for pap smear.  Vaccines updated today - see health maintenance section.    Return in about 6 months (around 01/12/2024).        Relevant Medications   bictegravir-emtricitabine-tenofovir AF (BIKTARVY) 50-200-25 MG TABS tablet   Other Relevant Orders   HIV 1 RNA quant-no reflex-bld   T-helper cells (CD4) count     Medium    Obesity    She has done well with ozempic but struggles with pretty much every side effect listed. I wonder if she would have better result from [facility] and I encouraged her to talk with her PCP about this.         Unprioritized   History of syphilis   Other Visit Diagnoses     Encounter for  immunization       Relevant Orders   Flu vaccine trivalent PF, 6mos and older(Flulaval,Afluria,Fluarix,Fluzone) (Completed)        Rexene Alberts, MSN, NP-C [facility]  [email or web address] Pager: [phone] Office: [phone] [facility] Main Line: [phone]

## 2023-07-16 LAB — T-HELPER CELLS (CD4) COUNT (NOT AT ARMC)
CD4 % Helper T Cell: 28 % — ABNORMAL LOW (ref 33–65)
CD4 T Cell Abs: 631 /uL (ref 400–1790)

## 2023-07-17 LAB — HIV-1 RNA QUANT-NO REFLEX-BLD
HIV 1 RNA Quant: <20 {copies}/mL — ABNORMAL HIGH
HIV-1 RNA Quant, Log: <1.3 {Log} — ABNORMAL HIGH

## 2023-07-17 LAB — RPR: RPR Ser Ql: NONREACTIVE

## 2023-08-10 ENCOUNTER — Ambulatory Visit: Payer: Self-pay | Admitting: Family Medicine

## 2023-08-11 ENCOUNTER — Other Ambulatory Visit: Payer: Self-pay | Admitting: Cardiovascular Disease

## 2023-08-11 DIAGNOSIS — E785 Hyperlipidemia, unspecified: Secondary | ICD-10-CM

## 2023-08-19 ENCOUNTER — Encounter: Payer: Self-pay | Admitting: Family Medicine

## 2023-08-19 ENCOUNTER — Ambulatory Visit: Payer: Medicare Other | Admitting: Family Medicine

## 2023-08-19 VITALS — BP 120/67 | HR 94 | Wt 231.0 lb

## 2023-08-19 DIAGNOSIS — Z794 Long term (current) use of insulin: Secondary | ICD-10-CM | POA: Diagnosis not present

## 2023-08-19 DIAGNOSIS — E8881 Metabolic syndrome: Secondary | ICD-10-CM

## 2023-08-19 DIAGNOSIS — I10 Essential (primary) hypertension: Secondary | ICD-10-CM | POA: Diagnosis not present

## 2023-08-19 DIAGNOSIS — E118 Type 2 diabetes mellitus with unspecified complications: Secondary | ICD-10-CM | POA: Diagnosis not present

## 2023-08-19 DIAGNOSIS — B20 Human immunodeficiency virus [HIV] disease: Secondary | ICD-10-CM

## 2023-08-19 DIAGNOSIS — Z6841 Body Mass Index (BMI) 40.0 and over, adult: Secondary | ICD-10-CM

## 2023-08-19 LAB — POCT GLYCOSYLATED HEMOGLOBIN (HGB A1C): Hemoglobin A1C: 5.3 % (ref 4.0–5.6)

## 2023-08-20 LAB — CMP AND LIVER
ALT: 9 [IU]/L (ref 0–32)
AST: 9 [IU]/L (ref 0–40)
Albumin: 4.1 g/dL (ref 3.9–4.9)
Alkaline Phosphatase: 66 [IU]/L (ref 44–121)
BUN: 19 mg/dL (ref 6–24)
Bilirubin Total: 0.4 mg/dL (ref 0.0–1.2)
Bilirubin, Direct: <0.1 mg/dL (ref 0.00–0.40)
CO2: 17 mmol/L — ABNORMAL LOW (ref 20–29)
Calcium: 9.3 mg/dL (ref 8.7–10.2)
Chloride: 104 mmol/L (ref 96–106)
Creatinine, Ser: 1.19 mg/dL — ABNORMAL HIGH (ref 0.57–1.00)
Glucose: 94 mg/dL (ref 70–99)
Potassium: 4.7 mmol/L (ref 3.5–5.2)
Sodium: 136 mmol/L (ref 134–144)
Total Protein: 7.4 g/dL (ref 6.0–8.5)
eGFR: 58 mL/min/{1.73_m2} — ABNORMAL LOW (ref >59)

## 2023-09-15 NOTE — Progress Notes (Signed)
Established Patient Office Visit  Subjective   Patient ID: Sherry Hall, female    DOB: 1981-02-18  Age: 42 y.o. MRN: [id number]  No chief complaint on file.   Sherry Hall is a 42 year old female with a medical history significant for type 2 diabetes mellitus, obesity, HIV disease, essential hypertension, and hyperlipidemia presents for follow-up of chronic conditions.  Patient has been doing well and is without complaints on today.  She has been following a low carbohydrate diet divided over small meals.  Patient's hemoglobin A1c has improved significantly over the past years since starting semaglutide.  Patient reports that appetite has been decreased.  She is increased her protein over the past month to improve weight loss.  She states that she has "plateaued" in terms of weight loss. Patient currently denies any blurry vision, dizziness, polyuria, polydipsia, or polyphasia.  She has been taking all medications consistently.  Diabetes She presents for her follow-up diabetic visit. She has type 2 diabetes mellitus. Her disease course has been stable. Pertinent negatives for hypoglycemia include no sweats. Pertinent negatives for diabetes include no blurred vision, no chest pain, no fatigue, no foot paresthesias, no foot ulcerations, no polydipsia, no polyphagia, no polyuria, no visual change, no weakness and no weight loss. Pertinent negatives for diabetic complications include no retinopathy. Risk factors for coronary artery disease include diabetes mellitus, obesity and hypertension. She has not had a previous visit with a dietitian. She participates in exercise intermittently. An ACE inhibitor/angiotensin II receptor blocker is being taken. She does not see a podiatrist.Eye exam is current.  Hypertension This is a chronic problem. The problem is controlled. Pertinent negatives include no blurred vision, chest pain, neck pain, orthopnea, peripheral edema, PND, shortness of breath  or sweats. There are no compliance problems.  There is no history of kidney disease, CAD/MI, heart failure or retinopathy. There is no history of chronic renal disease.    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/2014   Insomnia 04/04/2012   Microalbuminuria 04/04/2012   Hyperlipidemia with target LDL less than 100 03/21/2012   Obesity 03/21/2012   Posttraumatic stress disorder 10/01/2011   Anxiety disorder 07/28/2011   Past Medical History:  Diagnosis Date   Anemia    H/O   Anxiety    Depression    Diabetes mellitus type 1 (HCC)    GERD (gastroesophageal reflux disease)    HIV (human immunodeficiency virus infection) (HCC) 2011   Hypertension    Type 2 diabetes mellitus (HCC)    Past Surgical History:  Procedure Laterality Date   CESAREAN SECTION  2001, 2008, 2009, 2011   CHOLECYSTECTOMY     EXCISION OF BREAST LESION N/A 01/04/2018   Procedure: EXCISION OF CHEST WALL ABSCESS. MIDDLE CHEST;  Surgeon: Henrene Dodge, MD;  Location: [facility] ORS;  Service: General;  Laterality: N/A;  resection of chest wall cyst / abscess   FRACTURE SURGERY Right    HAND   TONSILLECTOMY     TUBAL LIGATION  2011   Social History   Tobacco Use   Smoking status: Every Day    Current packs/day: 0.50    Average packs/day: 0.5 packs/day for 15.0 years (7.5 ttl pk-yrs)    Types: Cigarettes   Smokeless tobacco: Never   Tobacco comments:    trying to slow, cutting back   Vaping Use   Vaping status: Never Used  Substance Use Topics   Alcohol use: Yes    Alcohol/week: 1.0 standard drink of alcohol    Types: 1 Standard drinks or equivalent per week    Comment: socially   Drug use: No   Social History   Socioeconomic History   Marital status: Single    Spouse name: Not on file   Number of children: Not on file   Years of education: Not on file   Highest education level: Not on file  Occupational History   Not on file  Tobacco Use   Smoking status: Every Day    Current packs/day: 0.50    Average packs/day: 0.5 packs/day for 15.0 years (7.5 ttl pk-yrs)    Types: Cigarettes   Smokeless tobacco: Never   Tobacco comments:    trying to slow, cutting back  Vaping Use   Vaping status: Never Used  Substance and Sexual Activity   Alcohol use: Yes    Alcohol/week: 1.0 standard drink of alcohol    Types: 1 Standard drinks or equivalent per week    Comment: socially   Drug use: No   Sexual activity: Yes    Partners: Male    Birth control/protection: Surgical    Comment: declined condoms  Other Topics Concern   Not on file  Social History Narrative   Not on file   Social Determinants of Health   Financial Resource Strain: Low Risk  (01/07/2023)   Overall Financial Resource Strain (CARDIA)    Difficulty of Paying Living Expenses: Not hard at all  Food Insecurity: No Food Insecurity (01/07/2023)   Hunger Vital Sign    Worried About Running Out of Food in the Last Year: Never true    Ran Out of Food in the Last Year: Never true  Transportation Needs: No Transportation Needs (01/07/2023)   PRAPARE - Administrator, Civil Service (Medical): No    Lack of Transportation (Non-Medical): No  Physical Activity: Insufficiently Active (01/07/2023)   Exercise Vital Sign    Days of Exercise per Week: 3 days    Minutes of Exercise per Session: 20 min  Stress: No Stress Concern Present (01/07/2023)   [employer] of Occupational Health -  Occupational Stress Questionnaire    Feeling of Stress : Not at all  Social Connections: Socially Isolated (01/07/2023)   Social Connection and Isolation Panel [NHANES]    Frequency of Communication with Friends and Family: More than three times a week    Frequency of Social Gatherings with Friends and Family: More than three times a week    Attends Religious Services: Never    Database administrator or Organizations: No    Attends Banker Meetings: Never    Marital Status: Never married  Intimate Partner Violence: Not At Risk (01/07/2023)   Humiliation, Afraid, Rape, and Kick questionnaire    Fear of Current or Ex-Partner: No    Emotionally Abused: No    Physically Abused: No    Sexually Abused: No   Family Status  Relation Name Status   Father  (Not Specified)   Mother  (  Not Specified)  No partnership data on file   Family History  Problem Relation Age of Onset   Diabetes Father    Hypertension Father    Cancer Mother        cervical       Review of Systems  Constitutional:  Negative for fatigue and weight loss.  Eyes:  Negative for blurred vision.  Respiratory:  Negative for shortness of breath.   Cardiovascular:  Negative for chest pain, orthopnea and PND.  Musculoskeletal:  Negative for neck pain.  Neurological:  Negative for weakness.  Endo/Heme/Allergies:  Negative for polydipsia and polyphagia.      Objective:     BP 120/67   Pulse 94   Wt 231 lb (104.8 kg)   SpO2 100%   BMI 45.11 kg/m  BP Readings from Last 3 Encounters:  08/19/23 120/67  07/15/23 122/82  02/09/23 131/65   Wt Readings from Last 3 Encounters:  08/19/23 231 lb (104.8 kg)  07/15/23 233 lb (105.7 kg)  02/09/23 245 lb 12.8 oz (111.5 kg)      Physical Exam   Results for orders placed or performed in visit on 08/19/23  CMP and Liver  Result Value Ref Range   Glucose 94 70 - 99 mg/dL   BUN 19 6 - 24 mg/dL   Creatinine, Ser 0.98 (H) 0.57 - 1.00 mg/dL   eGFR 58 (L)  >11 BJ/YNW/2.95   Sodium 136 134 - 144 mmol/L   Potassium 4.7 3.5 - 5.2 mmol/L   Chloride 104 96 - 106 mmol/L   CO2 17 (L) 20 - 29 mmol/L   Calcium 9.3 8.7 - 10.2 mg/dL   Total Protein 7.4 6.0 - 8.5 g/dL   Albumin 4.1 3.9 - 4.9 g/dL   Bilirubin Total 0.4 0.0 - 1.2 mg/dL   Bilirubin, Direct <6.21 0.00 - 0.40 mg/dL   Alkaline Phosphatase 66 44 - 121 IU/L   AST 9 0 - 40 IU/L   ALT 9 0 - 32 IU/L  POCT glycosylated hemoglobin (Hb A1C)  Result Value Ref Range   Hemoglobin A1C 5.3 4.0 - 5.6 %   HbA1c POC (<> result, manual entry)     HbA1c, POC (prediabetic range)     HbA1c, POC (controlled diabetic range)      Last CBC Lab Results  Component Value Date   WBC 10.5 03/26/2022   HGB 13.5 03/26/2022   HCT 39.4 03/26/2022   MCV 94.0 03/26/2022   MCH 32.2 03/26/2022   RDW 12.5 03/26/2022   PLT 248 03/26/2022   Last metabolic panel Lab Results  Component Value Date   GLUCOSE 94 08/19/2023   NA 136 08/19/2023   K 4.7 08/19/2023   CL 104 08/19/2023   CO2 17 (L) 08/19/2023   BUN 19 08/19/2023   CREATININE 1.19 (H) 08/19/2023   EGFR 58 (L) 08/19/2023   CALCIUM 9.3 08/19/2023   PROT 7.4 08/19/2023   ALBUMIN 4.1 08/19/2023   LABGLOB 3.2 02/09/2023   AGRATIO 1.2 02/09/2023   BILITOT 0.4 08/19/2023   ALKPHOS 66 08/19/2023   AST 9 08/19/2023   ALT 9 08/19/2023   ANIONGAP 14 03/26/2022   Last lipids Lab Results  Component Value Date   CHOL 183 12/10/2022   HDL 48 12/10/2022   LDLCALC 115 (H) 12/10/2022   TRIG 111 12/10/2022   CHOLHDL 3.8 12/10/2022   Last hemoglobin A1c Lab Results  Component Value Date   HGBA1C 5.3 08/19/2023   Last thyroid functions Lab Results  Component Value Date   TSH 1.42 07/08/2016   Last vitamin D No results found for: "25OHVITD2", "25OHVITD3", "VD25OH" Last vitamin B12 and Folate Lab Results  Component Value Date   VITAMINB12 501 01/27/2021      The 10-year ASCVD risk score (Arnett DK, et al., 2019) is: 8.6%    Assessment &  Plan:   Problem List Items Addressed This Visit       Other   HIV disease (HCC) (Chronic)   Other Visit Diagnoses     Type 2 diabetes mellitus with complication, with long-term current use of insulin (HCC)    -  Primary   Relevant Orders   POCT glycosylated hemoglobin (Hb A1C) (Completed)   CMP and Liver (Completed)   Essential hypertension       Relevant Orders   CMP and Liver (Completed)   Morbid obesity with BMI of 50.0-59.9, adult (HCC)       Metabolic syndrome       Relevant Orders   POCT glycosylated hemoglobin (Hb A1C) (Completed)   CMP and Liver (Completed)      1. Type 2 diabetes mellitus with complication, with long-term current use of insulin (HCC) Clarisse Gouge has been doing very well on her current medication regimen.  Her hemoglobin A1c has decreased to 5.3.  No medication changes are warranted at this time. - POCT glycosylated hemoglobin (Hb A1C) - CMP and Liver  2. Essential hypertension BP 120/67   Pulse 94   Wt 231 lb (104.8 kg)   SpO2 100%   BMI 45.11 kg/m  Blood pressure well-controlled, no medication changes. - CMP and Liver  3. Morbid obesity with BMI of 50.0-59.9, adult [facility]) The patient is asked to make an attempt to improve diet and exercise patterns to aid in medical management of this problem.   4. Metabolic syndrome  - POCT glycosylated hemoglobin (Hb A1C) - CMP and Liver  5. HIV disease (HCC) Continue following up with infectious disease as scheduled.    Follow-up in 6 months for chronic conditions Chrstopher Malenfant Rennis Petty  APRN, MSN, FNP-C [facility] [address] [place], [place] [address] [phone]

## 2023-10-22 ENCOUNTER — Encounter (HOSPITAL_BASED_OUTPATIENT_CLINIC_OR_DEPARTMENT_OTHER): Payer: Self-pay | Admitting: Emergency Medicine

## 2023-10-22 ENCOUNTER — Emergency Department (HOSPITAL_BASED_OUTPATIENT_CLINIC_OR_DEPARTMENT_OTHER): Payer: Medicare Other | Admitting: Radiology

## 2023-10-22 ENCOUNTER — Emergency Department (HOSPITAL_BASED_OUTPATIENT_CLINIC_OR_DEPARTMENT_OTHER)
Admission: EM | Admit: 2023-10-22 | Discharge: 2023-10-23 | Disposition: A | Discharge status: Home / Self Care | Payer: Medicare Other | Attending: Emergency Medicine | Admitting: Emergency Medicine

## 2023-10-22 ENCOUNTER — Other Ambulatory Visit: Payer: Self-pay

## 2023-10-22 DIAGNOSIS — Z79899 Other long term (current) drug therapy: Secondary | ICD-10-CM | POA: Diagnosis not present

## 2023-10-22 DIAGNOSIS — Z21 Asymptomatic human immunodeficiency virus [HIV] infection status: Secondary | ICD-10-CM | POA: Diagnosis not present

## 2023-10-22 DIAGNOSIS — J069 Acute upper respiratory infection, unspecified: Secondary | ICD-10-CM | POA: Insufficient documentation

## 2023-10-22 DIAGNOSIS — I5022 Chronic systolic (congestive) heart failure: Secondary | ICD-10-CM | POA: Diagnosis not present

## 2023-10-22 DIAGNOSIS — Z20822 Contact with and (suspected) exposure to covid-19: Secondary | ICD-10-CM | POA: Diagnosis not present

## 2023-10-22 DIAGNOSIS — I110 Hypertensive heart disease with heart failure: Secondary | ICD-10-CM | POA: Diagnosis not present

## 2023-10-22 DIAGNOSIS — E109 Type 1 diabetes mellitus without complications: Secondary | ICD-10-CM | POA: Insufficient documentation

## 2023-10-22 DIAGNOSIS — R0602 Shortness of breath: Secondary | ICD-10-CM | POA: Diagnosis present

## 2023-10-22 DIAGNOSIS — F17210 Nicotine dependence, cigarettes, uncomplicated: Secondary | ICD-10-CM | POA: Diagnosis not present

## 2023-10-22 LAB — CBC
HCT: 37.6 % (ref 36.0–46.0)
Hemoglobin: 12.7 g/dL (ref 12.0–15.0)
MCH: 31.6 pg (ref 26.0–34.0)
MCHC: 33.8 g/dL (ref 30.0–36.0)
MCV: 93.5 fL (ref 80.0–100.0)
Platelets: 215 10*3/uL (ref 150–400)
RBC: 4.02 MIL/uL (ref 3.87–5.11)
RDW: 13.5 % (ref 11.5–15.5)
WBC: 7.8 10*3/uL (ref 4.0–10.5)
nRBC: 0 % (ref 0.0–0.2)

## 2023-10-22 LAB — TROPONIN I (HIGH SENSITIVITY): Troponin I (High Sensitivity): 5 ng/L (ref <18)

## 2023-10-22 LAB — RESP PANEL BY RT-PCR (RSV, FLU A&B, COVID)  RVPGX2
Influenza A by PCR: NEGATIVE
Influenza B by PCR: NEGATIVE
Resp Syncytial Virus by PCR: NEGATIVE
SARS Coronavirus 2 by RT PCR: NEGATIVE

## 2023-10-22 LAB — BASIC METABOLIC PANEL
Anion gap: 10 (ref 5–15)
BUN: 19 mg/dL (ref 6–20)
CO2: 21 mmol/L — ABNORMAL LOW (ref 22–32)
Calcium: 8.7 mg/dL — ABNORMAL LOW (ref 8.9–10.3)
Chloride: 104 mmol/L (ref 98–111)
Creatinine, Ser: 1.22 mg/dL — ABNORMAL HIGH (ref 0.44–1.00)
GFR, Estimated: 56 mL/min — ABNORMAL LOW (ref >60)
Glucose, Bld: 89 mg/dL (ref 70–99)
Potassium: 3.7 mmol/L (ref 3.5–5.1)
Sodium: 135 mmol/L (ref 135–145)

## 2023-10-22 LAB — PREGNANCY, URINE: Preg Test, Ur: NEGATIVE

## 2023-10-22 MED ORDER — IPRATROPIUM-ALBUTEROL 0.5-2.5 (3) MG/3ML IN SOLN
3.0000 mL | Freq: Once | RESPIRATORY_TRACT | Status: AC
  Administered 2023-10-22: 3 mL via RESPIRATORY_TRACT
  Filled 2023-10-22: qty 3

## 2023-10-22 NOTE — ED Provider Notes (Signed)
 [place] EMERGENCY DEPARTMENT AT [facility] Provider Note  CSN: [id number] Arrival date & time: 10/22/23 2017  Chief Complaint(s) Cough and Shortness of Breath  HPI Modena Beldin is a 43 y.o. female with past medical history as below, significant for anxiety, depression, DM, HIV, PTSD, hypertension, CHF who presents to the ED with complaint of cough, dyspnea  She has been feeling unwell for the past 3 to 4 days.  Family member with similar symptoms.  No fevers or chills.  She is having intermittent cough, occasionally productive with clear or white sputum.  No vomiting or nausea.  She has some chest tightness over the past month she has mildly worsened over the past 3 days.  No chest pain.  No abdominal pain.  She is tolerant p.o. without difficulty.  Her weight has been stable, no significant edema appreciated by the patient  Past Medical History Past Medical History:  Diagnosis Date   Anemia    H/O   Anxiety    Depression    Diabetes mellitus type 1 (HCC)    GERD (gastroesophageal reflux disease)    HIV (human immunodeficiency virus infection) (HCC) 2011   Hypertension    Type 2 diabetes mellitus (HCC)    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 Authorizing Provider  fluconazole  (DIFLUCAN ) 150 MG tablet TAKE ONE TABLET BY MOUTH DAILY Patient not taking: Reported on 11/03/2022 09/03/22   Tilford Bertram HERO, FNP  Accu-Chek FastClix Lancets MISC 1 Units by Does not apply route 4 (four) times daily as needed. 10/11/19   Stroud, Natalie M, FNP  albuterol  (VENTOLIN  HFA) 108 (90 Base) MCG/ACT inhaler Inhale 2 puffs into the lungs every 4 (four) hours as needed for wheezing or shortness of breath. Patient not taking: Reported on 11/03/2022 07/30/20   Tilford Bertram HERO, FNP  ALPRAZolam  (XANAX ) 1 MG tablet Take 1 mg by mouth 2 (two) times daily as needed for anxiety (panic attack). 08/01/18   [provider]  atorvastatin  (LIPITOR) 10 MG tablet TAKE ONE TABLET BY MOUTH DAILY 07/13/23   O'Neal, Darryle Ned, MD  benzonatate  (TESSALON ) 100 MG capsule Take 1 capsule (100 mg total) by mouth every 8 (eight) hours. Patient not taking: Reported on 08/19/2023 03/27/22   Theadore Ozell HERO, MD  bictegravir-emtricitabine -tenofovir  AF (BIKTARVY ) 50-200-25 MG TABS tablet Take 1 tablet by mouth daily. 07/15/23   Melvenia Corean SAILOR, NP  Blood Glucose Monitoring Suppl (ACCU-CHEK GUIDE ME) w/Device KIT 1 Units by Does not apply route 4 (four) times daily as needed. 10/11/19   Stroud, Natalie M, FNP  Blood Pressure Monitoring (BLOOD PRESSURE CUFF) MISC 1 Units by Does not apply route daily. Please dispense on EXTRA LARGE (  up to 52cm) blood pressure cuff kit 03/06/21   O'Neal, Darryle Ned, MD  buPROPion  (WELLBUTRIN  XL) 300 MG 24 hr tablet Take 300 mg by mouth daily. 12/10/20   [provider]  busPIRone  (BUSPAR ) 10 MG tablet Take 10 mg by mouth 3 (three) times daily. 12/26/18   [provider]  carvedilol  (COREG ) 25 MG tablet TAKE ONE TABLET BY MOUTH TWICE A DAY 04/15/23   O'Neal, Darryle Ned, MD  empagliflozin  (JARDIANCE ) 10 MG TABS  tablet Take 1 tablet (10 mg total) by mouth daily. 04/13/22   O'NealDarryle Ned, MD  furosemide  (LASIX ) 40 MG tablet Take 1 tablet (40 mg total) by mouth daily as needed. 04/13/22   O'Neal, Darryle Ned, MD  GLOBAL EASE INJECT PEN NEEDLES 32G X 4 MM MISC USE AS DIRECTED TO ADMINISTER INSULIN  THREE TIMES DAILY BEFORE MEALS 08/30/19   Jegede, Olugbemiga E, MD  glucose blood (ACCU-CHEK GUIDE) test strip Use 3 times a day. Dx code: E11.9 10/17/19   Stroud, Natalie M, FNP  metroNIDAZOLE  (METROGEL ) 0.75 % gel Apply 1 Application topically 2 (two) times daily. Patient not taking: Reported on 02/09/2023 08/03/22   Hollis, Lachina M, FNP  mupirocin  ointment (BACTROBAN ) 2 % Apply 1 Application topically 2 (two) times daily. Patient not taking: Reported on 07/28/2022 05/12/22   Melvenia Corean SAILOR, NP  ondansetron  (ZOFRAN ) 4 MG tablet TAKE ONE TABLET BY MOUTH EVERY 6 HOURS AS NEEDED FOR NAUSEA OR VOMITING 07/06/23   Tilford Bertram HERO, FNP  sacubitril -valsartan  (ENTRESTO ) 97-103 MG Take 1 tablet by mouth 2 (two) times daily. 04/13/22   O'NealDarryle Ned, MD  Semaglutide , 2 MG/DOSE, 8 MG/3ML SOPN Inject 2 mg as directed once a week. 11/24/22   Tilford Bertram HERO, FNP  sertraline  (ZOLOFT ) 100 MG tablet Take by mouth daily. 12/26/18   [provider]  spironolactone  (ALDACTONE ) 25 MG tablet Take 0.5 tablets (12.5 mg total) by mouth daily. TAKE HALF TABLET BY MOUTH AT BEDTIME 04/13/22   O'Neal, Darryle Ned, MD  traZODone  (DESYREL ) 50 MG tablet Take 50-100 mg by mouth at bedtime as needed for sleep. 10/28/16   [provider]                                                                                                                                    Past Surgical History Past Surgical History:  Procedure Laterality Date   CESAREAN SECTION  2001, 2008, 2009, 2011   CHOLECYSTECTOMY     EXCISION OF BREAST LESION N/A 01/04/2018   Procedure: EXCISION OF CHEST WALL ABSCESS. MIDDLE CHEST;  Surgeon:  Desiderio Schanz, MD;  Location: [facility] ORS;  Service: General;  Laterality: N/A;  resection of chest wall cyst / abscess   FRACTURE SURGERY Right    HAND   TONSILLECTOMY     TUBAL LIGATION  2011   Family History Family History  Problem Relation Age of Onset   Diabetes  Father    Hypertension Father    Cancer Mother        cervical     Social History Social History   Tobacco Use   Smoking status: Every Day    Current packs/day: 0.50    Average packs/day: 0.5 packs/day for 15.0 years (7.5 ttl pk-yrs)    Types: Cigarettes   Smokeless tobacco: Never   Tobacco comments:    trying to slow, cutting back  Vaping Use   Vaping status: Never Used  Substance Use Topics   Alcohol use: Yes    Alcohol/week: 1.0 standard drink of alcohol    Types: 1 Standard drinks or equivalent per week    Comment: socially   Drug use: No   Allergies Percocet [oxycodone-acetaminophen ] and Latex  Review of Systems Review of Systems  Constitutional:  Positive for fatigue. Negative for fever.  HENT:  Positive for congestion, postnasal drip, rhinorrhea and sore throat.   Respiratory:  Positive for cough, chest tightness and shortness of breath. Negative for wheezing.   Cardiovascular:  Negative for chest pain and palpitations.  Gastrointestinal:  Negative for abdominal pain, nausea and vomiting.  Genitourinary:  Negative for difficulty urinating and dysuria.  Musculoskeletal:  Positive for arthralgias.  Skin:  Negative for rash.  Neurological:  Negative for numbness and headaches.  All other systems reviewed and are negative.   Physical Exam Vital Signs  I have reviewed the triage vital signs BP (!) 162/77   Pulse 94   Temp 97.9 F (36.6 C) (Oral)   Resp (!) 22   LMP 10/05/2023 (Approximate)   SpO2 99%  Physical Exam Vitals and nursing note reviewed.  Constitutional:      General: She is not in acute distress.    Appearance: Normal appearance. She is well-developed. She is obese.  HENT:      Head: Normocephalic and atraumatic.     Right Ear: External ear normal.     Left Ear: External ear normal.     Nose: Nose normal.     Mouth/Throat:     Mouth: Mucous membranes are moist.  Eyes:     General: No scleral icterus.       Right eye: No discharge.        Left eye: No discharge.  Cardiovascular:     Rate and Rhythm: Normal rate and regular rhythm.     Pulses: Normal pulses.     Heart sounds: Normal heart sounds. No murmur heard. Pulmonary:     Effort: Pulmonary effort is normal. No respiratory distress.     Breath sounds: Normal breath sounds. No stridor. No wheezing or rales.  Abdominal:     General: Abdomen is flat. There is no distension.     Palpations: Abdomen is soft.     Tenderness: There is no abdominal tenderness.  Musculoskeletal:     Cervical back: No rigidity.     Right lower leg: No edema.     Left lower leg: No edema.  Skin:    General: Skin is warm and dry.     Capillary Refill: Capillary refill takes less than 2 seconds.  Neurological:     Mental Status: She is alert.  Psychiatric:        Mood and Affect: Mood normal.        Behavior: Behavior normal. Behavior is cooperative.     ED Results and Treatments Labs (all labs ordered are listed, but only abnormal results are displayed) Labs Reviewed  BASIC METABOLIC  PANEL - Abnormal; Notable for the following components:      Result Value   CO2 21 (*)    Creatinine, Ser 1.22 (*)    Calcium  8.7 (*)    GFR, Estimated 56 (*)    All other components within normal limits  RESP PANEL BY RT-PCR (RSV, FLU A&B, COVID)  RVPGX2  CBC  PREGNANCY, URINE  TROPONIN I (HIGH SENSITIVITY)  TROPONIN I (HIGH SENSITIVITY)                                                                                                                          Radiology DG Chest 2 View Result Date: 10/22/2023 CLINICAL DATA:  sob, chest tightness EXAM: CHEST - 2 VIEW COMPARISON:  03/26/2022. FINDINGS: The heart size and mediastinal  contours are within normal limits. Both lungs are clear. No pneumothorax or pleural effusion. There are thoracic degenerative changes. IMPRESSION: No acute cardiopulmonary disease. Electronically Signed   By: Fonda Field M.D.   On: 10/22/2023 22:20    Pertinent labs & imaging results that were available during my care of the patient were reviewed by me and considered in my medical decision making (see MDM for details).  Medications Ordered in ED Medications  ipratropium-albuterol  (DUONEB) 0.5-2.5 (3) MG/3ML nebulizer solution 3 mL (3 mLs Nebulization Given 10/22/23 2358)                                                                                                                                     Procedures Procedures  (including critical care time)  Medical Decision Making / ED Course    Medical Decision Making:    Pamela Sneed is a 43 y.o. female with past medical history as below, significant for anxiety, depression, DM, HIV, PTSD, hypertension, CHF who presents to the ED with complaint of cough, dyspnea. The complaint involves an extensive differential diagnosis and also carries with it a high risk of complications and morbidity.  Serious etiology was considered. Ddx includes but is not limited to: In my evaluation of this patient's dyspnea my DDx includes, but is not limited to, pneumonia, pulmonary embolism, pneumothorax, pulmonary edema, metabolic acidosis, asthma, COPD, cardiac cause, anemia, anxiety, etc.    Complete initial physical exam performed, notably the patient was in no distress, no hypoxia.    Reviewed and confirmed nursing documentation for past medical history, family history, social history.  Vital  signs reviewed.      Clinical Course as of 10/23/23 0001  Fri Oct 22, 2023  2344 Creatinine(!): 1.22 Similar 2 mos ago [SG]  2346 Echocardiogram 7/22 with LVEF 45 to 50% [SG]    Clinical Course User Index [SG] Elnor Jayson LABOR, DO    Brief  summary: 43 year old female with history as above here with URI symptoms.  Dyspnea, chest tightness.  Positive sick contact with family with similar symptoms.  Screening labs ordered in triage reviewed and are stable.  Her creatinine is mildly elevated marginally from baseline.  Troponin is negative, EKG without gross abnormality, chest x-ray unremarkable.  She is well-appearing on exam.  Favor likely viral syndrome.  Will give nebulized breathing treatment to aid in symptoms.  discussed supportive care at home, strict return precautions.  HIV well-controlled, Biktarvy .  Last visit with ID was 9/24  She has no chest pain, only chest tightness. No current chest pain. Initial trop was negative. HEART score is 2 regardless  The patient improved significantly and was discharged in stable condition. Detailed discussions were had with the patient regarding current findings, and need for close f/u with PCP or on call doctor. The patient has been instructed to return immediately if the symptoms worsen in any way for re-evaluation. Patient verbalized understanding and is in agreement with current care plan. All questions answered prior to discharge.              Additional history obtained: -Additional history obtained from na -External records from outside source obtained and reviewed including: Chart review including previous notes, labs, imaging, consultation notes including  Prior echo, home medications, prior labs and imaging, cardiology documentation   Lab Tests: -I ordered, reviewed, and interpreted labs.   The pertinent results include:   Labs Reviewed  BASIC METABOLIC PANEL - Abnormal; Notable for the following components:      Result Value   CO2 21 (*)    Creatinine, Ser 1.22 (*)    Calcium  8.7 (*)    GFR, Estimated 56 (*)    All other components within normal limits  RESP PANEL BY RT-PCR (RSV, FLU A&B, COVID)  RVPGX2  CBC  PREGNANCY, URINE  TROPONIN I (HIGH SENSITIVITY)   TROPONIN I (HIGH SENSITIVITY)    Notable for labs stable  EKG   EKG Interpretation Date/Time:  Friday October 22 2023 20:37:21 EST Ventricular Rate:  86 PR Interval:  154 QRS Duration:  90 QT Interval:  380 QTC Calculation: 454 R Axis:   61  Text Interpretation: Normal sinus rhythm When compared with ECG of 26-Mar-2022 21:04, No significant change was found Confirmed by Cottie Cough [phone]) on 10/22/2023 8:42:15 PM         Imaging Studies ordered: I ordered imaging studies including cxr I independently visualized the following imaging with scope of interpretation limited to determining acute life threatening conditions related to emergency care; findings noted above I independently visualized and interpreted imaging. I agree with the radiologist interpretation   Medicines ordered and prescription drug management: Meds ordered this encounter  Medications   ipratropium-albuterol  (DUONEB) 0.5-2.5 (3) MG/3ML nebulizer solution 3 mL    -I have reviewed the patients home medicines and have made adjustments as needed   Consultations Obtained: na   Cardiac Monitoring: The patient was maintained on a cardiac monitor.  I personally viewed and interpreted the cardiac monitored which showed an underlying rhythm of: NSR Continuous pulse oximetry interpreted by myself, 100% on RA.    Social Determinants  of Health:  Diagnosis or treatment significantly limited by social determinants of health: obesity, tobacco use   Reevaluation: After the interventions noted above, I reevaluated the patient and found that they have improved  Co morbidities that complicate the patient evaluation  Past Medical History:  Diagnosis Date   Anemia    H/O   Anxiety    Depression    Diabetes mellitus type 1 (HCC)    GERD (gastroesophageal reflux disease)    HIV (human immunodeficiency virus infection) (HCC) 2011   Hypertension    Type 2 diabetes mellitus (HCC)        Dispostion: Disposition decision including need for hospitalization was considered, and patient discharged from emergency department.    Final Clinical Impression(s) / ED Diagnoses Final diagnoses:  URI with cough and congestion        Elnor Jayson LABOR, DO 10/23/23 0001

## 2023-10-22 NOTE — ED Triage Notes (Signed)
 Sob, chest tightness, cough congestion runny nose since Sunday  Lungs sound crackles in bases

## 2023-10-22 NOTE — ED Triage Notes (Signed)
 Called to assess patient in triage with c/o sob and chest tightness. Auscultation reveals clear breath sounds on the right with fine crackles over the left posterior and diminished left base. VSS in triage on room air. No indication at this time for BD therapy.

## 2023-10-23 MED ORDER — GUAIFENESIN-DM 100-10 MG/5ML PO SYRP: 5.0000 mL | ORAL_SOLUTION | ORAL | 0 refills | Status: DC | PRN

## 2023-10-23 MED ORDER — ALBUTEROL SULFATE HFA 108 (90 BASE) MCG/ACT IN AERS: 1.0000 | INHALATION_SPRAY | Freq: Four times a day (QID) | RESPIRATORY_TRACT | 0 refills | Status: DC | PRN

## 2023-10-23 MED ORDER — OXYMETAZOLINE HCL 0.05 % NA SOLN: 1.0000 | Freq: Two times a day (BID) | NASAL | 0 refills | Status: AC

## 2023-10-23 NOTE — Discharge Instructions (Addendum)
 You have been seen in the Emergency Department (ED) today for a likely viral illness.  Please drink plenty of clear fluids (water, Gatorade, chicken broth, etc).  You may use Tylenol  and/or Motrin  according to label instructions.  You can alternate between the two without any side effects.  Monitor your weight closely, be on the look out for water retention in the setting of your known history of heart failure.  Please refrain from tobacco use.  Please follow up with your doctor as listed above.  Call your doctor or return to the Emergency Department (ED) if you are unable to tolerate fluids due to vomiting, have worsening trouble breathing, become extremely tired or difficult to awaken, or if you develop any other symptoms that concern you.

## 2023-11-01 ENCOUNTER — Other Ambulatory Visit: Payer: Self-pay | Admitting: Family Medicine

## 2023-11-01 ENCOUNTER — Telehealth: Payer: Self-pay

## 2023-11-01 NOTE — Progress Notes (Signed)
 Transition Care Management Unsuccessful Follow-up Telephone Call  Date of discharge and from where:  10/23/2023 [facility]  Attempts:  1st Attempt  Reason for unsuccessful TCM follow-up call:  Unable to leave message voicemail not setup.  Rozelle Caudle [facility]  [facility], [facility] Guide Direct Dial: [phone]  Website: [email or web address]

## 2023-11-02 ENCOUNTER — Telehealth: Payer: Self-pay

## 2023-11-02 NOTE — Progress Notes (Signed)
 Transition Care Management Follow-up Telephone Call Date of discharge and from where: 10/23/2023 [facility] How have you been since you were released from the hospital? Patient stated she is beginning to feel better. Any questions or concerns? No  Items Reviewed: Did the pt receive and understand the discharge instructions provided? Yes  Medications obtained and verified? Yes  Other? No  Any new allergies since your discharge? No  Dietary orders reviewed? Yes Do you have support at home? Yes   Follow up appointments reviewed:  PCP Hospital f/u appt confirmed? Yes  Scheduled to see  on 01/13/2024 @ [facility] Patient [facility] Specialist Hospital f/u appt confirmed? No  Scheduled to see  on  @ . Are transportation arrangements needed? No  If their condition worsens, is the pt aware to call PCP or go to the Emergency Dept.? Yes Was the patient provided with contact information for the PCP's office or ED? Yes Was to pt encouraged to call back with questions or concerns? Yes   Mehtaab Mayeda [facility]  [facility], [facility] Guide Direct Dial: [phone]  Website: [email or web address]

## 2023-11-03 ENCOUNTER — Inpatient Hospital Stay: Payer: Self-pay | Admitting: Nurse Practitioner

## 2023-11-08 ENCOUNTER — Ambulatory Visit: Payer: Medicare Other | Attending: Nurse Practitioner | Admitting: Nurse Practitioner

## 2023-11-08 ENCOUNTER — Other Ambulatory Visit (HOSPITAL_COMMUNITY): Payer: Self-pay

## 2023-11-08 ENCOUNTER — Encounter: Payer: Self-pay | Admitting: Nurse Practitioner

## 2023-11-08 VITALS — BP 142/82 | HR 94 | Ht 60.0 in | Wt 246.6 lb

## 2023-11-08 DIAGNOSIS — R0602 Shortness of breath: Secondary | ICD-10-CM | POA: Diagnosis not present

## 2023-11-08 DIAGNOSIS — R0789 Other chest pain: Secondary | ICD-10-CM | POA: Insufficient documentation

## 2023-11-08 DIAGNOSIS — E782 Mixed hyperlipidemia: Secondary | ICD-10-CM | POA: Insufficient documentation

## 2023-11-08 DIAGNOSIS — I5022 Chronic systolic (congestive) heart failure: Secondary | ICD-10-CM | POA: Insufficient documentation

## 2023-11-08 DIAGNOSIS — I10 Essential (primary) hypertension: Secondary | ICD-10-CM | POA: Diagnosis not present

## 2023-11-08 MED ORDER — POTASSIUM CHLORIDE CRYS ER 20 MEQ PO TBCR: 20.0000 meq | EXTENDED_RELEASE_TABLET | Freq: Every day | ORAL | 0 refills | Status: DC

## 2023-11-08 MED ORDER — FUROSEMIDE 40 MG PO TABS: 40.0000 mg | ORAL_TABLET | Freq: Every day | ORAL | 3 refills | Status: DC

## 2023-11-08 MED ORDER — OMRON 3 SERIES BP MONITOR DEVI
0 refills | Status: DC
  Filled 2023-11-08: qty 1, 30d supply, fill #0

## 2023-11-08 NOTE — Patient Instructions (Signed)
Medication Instructions:  Your physician has recommended you make the following change in your medication:  Furosemide (Lasix) 40 mg by mouth twice daily for 3 days then once daily  Potassium 20 mEq by mouth daily for 3 days  *If you need a refill on your cardiac medications before your next appointment, please call your pharmacy*   Lab Work: BMP, BNP, CBC  If you have labs (blood work) drawn today and your tests are completely normal, you will receive your results only by: MyChart Message (if you have MyChart) OR A paper copy in the mail If you have any lab test that is abnormal or we need to change your treatment, we will call you to review the results.   Testing/Procedures: Your physician has requested that you have an echocardiogram. Echocardiography is a painless test that uses sound waves to create images of your heart. It provides your doctor with information about the size and shape of your heart and how well your heart's chambers and valves are working. This procedure takes approximately one hour. There are no restrictions for this procedure. Please do NOT wear cologne, perfume, aftershave, or lotions (deodorant is allowed). Please arrive 15 minutes prior to your appointment time.  Please note: We ask at that you not bring children with you during ultrasound (echo/ vascular) testing. Due to room size and safety concerns, children are not allowed in the ultrasound rooms during exams. Our front office staff cannot provide observation of children in our lobby area while testing is being conducted. An adult accompanying a patient to their appointment will only be allowed in the ultrasound room at the discretion of the ultrasound technician under special circumstances. We apologize for any inconvenience.  Your physician has requested that you have en exercise stress myoview. For further information please visit [email or web address] Please follow instruction sheet, as given.   You are  scheduled for a Myocardial Perfusion Imaging Study. Please arrive 15 minutes prior to your appointment time for registration and insurance purposes.   The test will take approximately 3 to 4 hours to complete; you may bring reading material.  If someone comes with you to your appointment, they will need to remain in the main lobby due to limited space in the testing area. **If you are pregnant or breastfeeding, please notify the nuclear lab prior to your appointment**   How to prepare for your Myocardial Perfusion Test: Do not eat or drink 3 hours prior to your test, except you may have water. Do not consume products containing caffeine (regular or decaffeinated) 12 hours prior to your test. (ex: coffee, chocolate, sodas, tea). Do bring a list of your current medications with you.  If not listed below, you may take your medications as normal.   DO NOT TAKE CARVEDILOL the night before and morning of testing   Do wear comfortable clothes (no dresses or overalls) and walking shoes, tennis shoes preferred (No heels or open toe shoes are allowed). Do NOT wear cologne, perfume, aftershave, or lotions (deodorant is allowed). If these instructions are not followed, your test will have to be rescheduled.  If you cannot keep your appointment, please provide 24 hours notification to the Nuclear Lab, to avoid a possible $50 charge to your account.       Follow-Up: At [facility], you and your health needs are our priority.  As part of our continuing mission to provide you with exceptional heart care, we have created designated Provider Care Teams.  These Care Teams  include your primary Cardiologist (physician) and Advanced Practice Providers (APPs -  Physician Assistants and Nurse Practitioners) who all work together to provide you with the care you need, when you need it.  We recommend signing up for the patient portal called "MyChart".  Sign up information is provided on this After Visit  Summary.  MyChart is used to connect with patients for Virtual Visits (Telemedicine).  Patients are able to view lab/test results, encounter notes, upcoming appointments, etc.  Non-urgent messages can be sent to your provider as well.   To learn more about what you can do with MyChart, go to [email or web address].    Your next appointment:   3 week(s)  Provider:   Robin Searing, NP       Other Instructions Cool mist humidifier  Please call in if your weight increases 2-3 pounds in 24 hours or 5 pounds in 1 week

## 2023-11-08 NOTE — Progress Notes (Signed)
Cardiology Office Note    Patient Name: Sherry Hall Date of Encounter: 11/08/2023  Primary Care Provider:  Massie Maroon, FNP Primary Cardiologist:  Reatha Harps, MD Primary Electrophysiologist: None   Past Medical History    Past Medical History:  Diagnosis Date   Anemia    H/O   Anxiety    Depression    Diabetes mellitus type 1 (HCC)    GERD (gastroesophageal reflux disease)    HIV (human immunodeficiency virus infection) (HCC) 2011   Hypertension    Type 2 diabetes mellitus (HCC)     History of Present Illness  Sherry Hall is a 43 y.o. female with a PMH of HFrEF, NICM DM type II, obesity, OSA, HLD, HIV, HTN who presents today for 1 year follow-up.  [name] was seen initially in 2022 as an inpatient for evaluation of COVID related CHF.  She completed a 2D echo that showed moderate LVH with EF of 35-40% with akinesis and inferior wall basal anterior septal segment abnormality.  She was followed by Dr. Flora Lipps who is currently her cardiologist.  She was placed on GDMT and 2D echo was repeated on 04/2021 that showed improved heart pump function at 45-50% with no changes made to current GDMT.  She was last seen for follow-up on 04/13/2022 after having recent diagnosis of pneumonia.  She was found to have mildly elevated BP and reported being out of spironolactone.  She was encouraged to undergo weight loss surgery and referral was provided. She was euvolemic on exam blood pressure was improved.  She was seen in the ED on 10/22/2023 with complaint of shortness of breath.  He was found to have productive cough with white sputum and some chest tightness that had worsened over the past 3 days.  EKG was completed without gross abnormalities and chest x-ray was unremarkable.  Troponins were completed and were negative for ACS and patient was discharged in stable condition with albuterol indication of possible viral syndrome.  Sherry Hall presents today with her son for  post ED follow-up.   She presents with persistent shortness of breath, productive cough with white sputum, and chest heaviness. These symptoms began in December following a stressful event and have been progressively worsening. She reports that her shortness of breath is particularly severe in the mornings and is exacerbated by activity. She also experiences chest heaviness when lying flat, requiring her to sleep in an upright position using four pillows. She has been using an albuterol inhaler, which seems to escalate her symptoms, causing a watery cough. She also reports a loss of appetite, which has led to inconsistent intake of her heart medications, including Lasix, which she has been taking at half the prescribed dose as needed. She has noticed a recent weight gain, which she attributes to fluid retention.her weight has increased by 15 pounds since October and she has seen good results with Ozempic.  During today's visit her blood pressure was initially elevated at 150/96 and was 142/82 on recheck.  Patient denies chest pain, palpitations, dyspnea, PND, orthopnea, nausea, vomiting, dizziness, syncope, edema, weight gain, or early satiety.   Review of Systems  Please see the history of present illness.    All other systems reviewed and are otherwise negative except as noted above.  Physical Exam    Wt Readings from Last 3 Encounters:  11/08/23 246 lb 9.6 oz (111.9 kg)  08/19/23 231 lb (104.8 kg)  07/15/23 233 lb (105.7 kg)   VS: Vitals:  11/08/23 1046 11/08/23 1130  BP: (!) 150/96 (!) 142/82  Pulse: 94   SpO2: 96%   ,Body mass index is 48.16 kg/m. GEN: Well nourished, well developed in no acute distress Neck: Slight elevation to JVD; No carotid bruits Pulmonary: Clear to auscultation without rales, wheezing or rhonchi  Cardiovascular: Normal rate. Regular rhythm. Normal S1. Normal S2.   Murmurs: There is no murmur.  ABDOMEN: Soft, non-tender, non-distended EXTREMITIES:  No edema; No  deformity trace lower extremity edema  EKG/LABS/ Recent Cardiac Studies   ECG personally reviewed by me today -none completed today  Risk Assessment/Calculations:     Lab Results  Component Value Date   WBC 7.8 10/22/2023   HGB 12.7 10/22/2023   HCT 37.6 10/22/2023   MCV 93.5 10/22/2023   PLT 215 10/22/2023   Lab Results  Component Value Date   CREATININE 1.22 (H) 10/22/2023   BUN 19 10/22/2023   NA 135 10/22/2023   K 3.7 10/22/2023   CL 104 10/22/2023   CO2 21 (L) 10/22/2023   Lab Results  Component Value Date   CHOL 183 12/10/2022   HDL 48 12/10/2022   LDLCALC 115 (H) 12/10/2022   TRIG 111 12/10/2022   CHOLHDL 3.8 12/10/2022    Lab Results  Component Value Date   HGBA1C 5.3 08/19/2023   Assessment & Plan    1.  Shortness of breath: -Patient seen in the ED with previous complaint of shortness of breath and productive cough with white sputum -Chest x-ray completed with no acute changes and today patient is still experiencing mild productive cough with deep breaths. -She is volume up on exam was advised to follow-up with PCP for further evaluation of productive cough.  2.  Chest discomfort: -Chest pain with activity and when lying flat, with pressure sensation on one side of the head. No radiation of pain reported. -Order nuclear stress test to rule out cardiac changes.  3.  HFrEF: -Most recent 2D echo completed 05/06/2021 with improved EF of 4550% with mild LVH mitral stenosis Exacerbation with symptoms of shortness of breath, chest heaviness, and orthopnea. Non-compliance with medications due to loss of appetite and forgetting p.m. dose due to life stress.. Jugular venous distension noted on physical exam, suggesting fluid overload. -Increase Lasix to 40mg  twice daily for three days, then 40mg  daily. -Start Potassium daily for three days. -Resume Entresto and Carvedilol as prescribed. -Discontinue Spironolactone due to side effect of blurred vision. -Check  BNP, CBC, and kidney function today. -Repeat labs in two weeks to monitor kidney function and potassium levels. -Order echocardiogram for next month to assess cardiac function. -Recommend low salt diet and daily weight monitoring. -Provide prescription for Omiron blood pressure cuff and instruct patient to check blood pressure two hours after medication for the next two weeks.  4.  History of HIV: -Controlled on Biktarvy as prescribed  5.  Essential hypertension: -HYPERTENSION CONTROL Vitals:   11/08/23 1046 11/08/23 1130  BP: (!) 150/96 (!) 142/82    The patient's blood pressure is elevated above target today.  In order to address the patient's elevated BP: Blood pressure will be monitored at home to determine if medication changes need to be made.   -Continue carvedilol 25 mg twice daily, Entresto 97/103 mg twice daily -Discontinue spironolactone due to side effects.  6.  Hyperlipidemia: -Patient's LDL cholesterol was 115 -Continue Lipitor 10 mg daily  Disposition: Follow-up with Reatha Harps, MD or APP in 3 weeks Informed Consent   Shared  Decision Making/Informed Consent The risks [chest pain, shortness of breath, cardiac arrhythmias, dizziness, blood pressure fluctuations, myocardial infarction, stroke/transient ischemic attack, nausea, vomiting, allergic reaction, radiation exposure, metallic taste sensation and life-threatening complications (estimated to be 1 in 10,000)], benefits (risk stratification, diagnosing coronary artery disease, treatment guidance) and alternatives of a nuclear stress test were discussed in detail with Ms. Laguna and she agrees to proceed.      Signed, Napoleon Form, Leodis Rains, NP 11/08/2023, 11:55 AM [facility]

## 2023-11-09 ENCOUNTER — Ambulatory Visit (INDEPENDENT_AMBULATORY_CARE_PROVIDER_SITE_OTHER): Payer: Self-pay | Admitting: Nurse Practitioner

## 2023-11-09 ENCOUNTER — Encounter: Payer: Self-pay | Admitting: Nurse Practitioner

## 2023-11-09 ENCOUNTER — Other Ambulatory Visit: Payer: Self-pay

## 2023-11-09 VITALS — BP 109/54 | HR 81 | Temp 97.4°F | Wt 239.0 lb

## 2023-11-09 DIAGNOSIS — F32A Depression, unspecified: Secondary | ICD-10-CM | POA: Diagnosis not present

## 2023-11-09 DIAGNOSIS — G4700 Insomnia, unspecified: Secondary | ICD-10-CM

## 2023-11-09 DIAGNOSIS — E785 Hyperlipidemia, unspecified: Secondary | ICD-10-CM

## 2023-11-09 DIAGNOSIS — F419 Anxiety disorder, unspecified: Secondary | ICD-10-CM | POA: Diagnosis not present

## 2023-11-09 DIAGNOSIS — E118 Type 2 diabetes mellitus with unspecified complications: Secondary | ICD-10-CM

## 2023-11-09 DIAGNOSIS — I5022 Chronic systolic (congestive) heart failure: Secondary | ICD-10-CM

## 2023-11-09 DIAGNOSIS — Z79899 Other long term (current) drug therapy: Secondary | ICD-10-CM

## 2023-11-09 DIAGNOSIS — Z794 Long term (current) use of insulin: Secondary | ICD-10-CM | POA: Diagnosis not present

## 2023-11-09 DIAGNOSIS — I10 Essential (primary) hypertension: Secondary | ICD-10-CM

## 2023-11-09 DIAGNOSIS — F17200 Nicotine dependence, unspecified, uncomplicated: Secondary | ICD-10-CM

## 2023-11-09 LAB — BASIC METABOLIC PANEL
BUN/Creatinine Ratio: 22 (ref 9–23)
BUN: 24 mg/dL (ref 6–24)
CO2: 20 mmol/L (ref 20–29)
Calcium: 8.8 mg/dL (ref 8.7–10.2)
Chloride: 106 mmol/L (ref 96–106)
Creatinine, Ser: 1.1 mg/dL — ABNORMAL HIGH (ref 0.57–1.00)
Glucose: 109 mg/dL — ABNORMAL HIGH (ref 70–99)
Potassium: 4.7 mmol/L (ref 3.5–5.2)
Sodium: 138 mmol/L (ref 134–144)
eGFR: 64 mL/min/{1.73_m2} (ref >59)

## 2023-11-09 LAB — CBC
Hematocrit: 32.7 % — ABNORMAL LOW (ref 34.0–46.6)
Hemoglobin: 11.1 g/dL (ref 11.1–15.9)
MCH: 32.3 pg (ref 26.6–33.0)
MCHC: 33.9 g/dL (ref 31.5–35.7)
MCV: 95 fL (ref 79–97)
Platelets: 233 10*3/uL (ref 150–450)
RBC: 3.44 x10E6/uL — ABNORMAL LOW (ref 3.77–5.28)
RDW: 13.1 % (ref 11.7–15.4)
WBC: 6.9 10*3/uL (ref 3.4–10.8)

## 2023-11-09 LAB — PRO B NATRIURETIC PEPTIDE: NT-Pro BNP: 1163 pg/mL — ABNORMAL HIGH (ref 0–130)

## 2023-11-09 MED ORDER — ATORVASTATIN CALCIUM 10 MG PO TABS: 10.0000 mg | ORAL_TABLET | Freq: Every day | ORAL | 0 refills | Status: DC

## 2023-11-09 NOTE — Patient Instructions (Addendum)
Please consider getting  Tdap vaccine at local pharmacy.    It is important that you exercise regularly at least 30 minutes 5 times a week as tolerated  Think about what you will eat, plan ahead. Choose " clean, green, fresh or frozen" over canned, processed or packaged foods which are more sugary, salty and fatty. 70 to 75% of food eaten should be vegetables and fruit. Three meals at set times with snacks allowed between meals, but they must be fruit or vegetables. Aim to eat over a 12 hour period , example 7 am to 7 pm, and STOP after  your last meal of the day. Drink water,generally about 64 ounces per day, no other drink is as healthy. Fruit juice is best enjoyed in a healthy way, by EATING the fruit.  Thanks for choosing [facility] we consider it a privelige to serve you.

## 2023-11-09 NOTE — Assessment & Plan Note (Signed)
Continue trazodone 50 to 100 mg at bedtime as needed

## 2023-11-09 NOTE — Assessment & Plan Note (Signed)
Continue Entresto 97-1.3 mg 1 tablet twice daily, furosemide 40 mg daily, Jardiance 10 mg daily Need to take medications daily as ordered discussed Encouraged to maintain close follow-up with cardiology

## 2023-11-09 NOTE — Progress Notes (Signed)
Established Patient Office Visit  Subjective:  Patient ID: Sherry Hall, female    DOB: [birth year]  Age: 43 y.o. MRN: [id number]  CC:  Chief Complaint  Patient presents with   Shortness of Breath    HPI Sherry Hall is a 43 y.o. female  has a past medical history of Anemia, Anxiety, Depression, Diabetes mellitus type 1 (HCC), GERD (gastroesophageal reflux disease), HIV (human immunodeficiency virus infection) (HCC) (2011), Hypertension, and Type 2 diabetes mellitus (HCC).  Patient presents with complaints of shortness of breath.  CHF .patient was evaluated by cardiology yesterday due to of shortness of breath, chest pain, cough.  Stated that prior to her visit with cardiology yesterday she gasp for air after walking a few feet.  She was also having to sleep upright due to her shortness of breath.  Stated that she had stopped taking her medications because she has been depressed due to her son's mental health issues, stated that her son tried to commit suicide recently, also stated that she lost one of her children to suicide at age 55 some years ago.  Patient stated that she is about 75% better since she started taking her medication taking yesterday after seeing cardiology.  She has upcoming echocardiogram and stress test.   Anxiety and depression.  She is currently on Xanax 1 mg twice daily as needed, bupropion 300 mg daily, buspirone 10 mg 3 times daily Zoloft 100 mg daily, trazodone 50 to 100 mg at bedtime as needed for sleep.  [facility].  She denies SI, HI  Tobacco use disorder.  Stated that she has quit smoking since about a week ago, she was previously smoking half pack of cigarettes daily, started smoking at age 27.  Marland Kitchen   Past Medical History:  Diagnosis Date   Anemia    H/O   Anxiety    Depression    Diabetes mellitus type 1 (HCC)    GERD (gastroesophageal reflux disease)    HIV (human immunodeficiency virus  infection) (HCC) 2011   Hypertension    Type 2 diabetes mellitus [facility])     Past Surgical History:  Procedure Laterality Date   CESAREAN SECTION  2001, 2008, 2009, 2011   CHOLECYSTECTOMY     EXCISION OF BREAST LESION N/A 01/04/2018   Procedure: EXCISION OF CHEST WALL ABSCESS. MIDDLE CHEST;  Surgeon: Henrene Dodge, MD;  Location: [facility] ORS;  Service: General;  Laterality: N/A;  resection of chest wall cyst / abscess   FRACTURE SURGERY Right    HAND   TONSILLECTOMY     TUBAL LIGATION  2011    Family History  Problem Relation Age of Onset   Diabetes Father    Hypertension Father    Cancer Mother        cervical     Social History   Socioeconomic History   Marital status: Single    Spouse name: Not on file   Number of children: Not on file   Years of education: Not on file   Highest education level: Not on file  Occupational History   Not on file  Tobacco Use   Smoking status: Every Day    Current packs/day: 0.50    Average packs/day: 0.5 packs/day for 15.0 years (7.5 ttl pk-yrs)    Types: Cigarettes   Smokeless tobacco: Never   Tobacco comments:    trying to slow, cutting back  Vaping Use   Vaping status: Never Used  Substance and Sexual  Activity   Alcohol use: Yes    Alcohol/week: 1.0 standard drink of alcohol    Types: 1 Standard drinks or equivalent per week    Comment: socially   Drug use: No   Sexual activity: Yes    Partners: Male    Birth control/protection: Surgical    Comment: declined condoms  Other Topics Concern   Not on file  Social History Narrative   Lives with her fiance and her son   Social Drivers of Corporate investment banker Strain: Low Risk  (11/02/2023)   Overall Financial Resource Strain (CARDIA)    Difficulty of Paying Living Expenses: Not very hard  Food Insecurity: No Food Insecurity (11/02/2023)   Hunger Vital Sign    Worried About Running Out of Food in the Last Year: Never true    Ran Out of Food in the Last Year: Never true   Transportation Needs: No Transportation Needs (11/02/2023)   PRAPARE - Administrator, Civil Service (Medical): No    Lack of Transportation (Non-Medical): No  Physical Activity: Insufficiently Active (01/07/2023)   Exercise Vital Sign    Days of Exercise per Week: 3 days    Minutes of Exercise per Session: 20 min  Stress: No Stress Concern Present (01/07/2023)   [employer] of Occupational Health - Occupational Stress Questionnaire    Feeling of Stress : Not at all  Social Connections: Socially Isolated (01/07/2023)   Social Connection and Isolation Panel [NHANES]    Frequency of Communication with Friends and Family: More than three times a week    Frequency of Social Gatherings with Friends and Family: More than three times a week    Attends Religious Services: Never    Database administrator or Organizations: No    Attends Banker Meetings: Never    Marital Status: Never married  Intimate Partner Violence: Not At Risk (01/07/2023)   Humiliation, Afraid, Rape, and Kick questionnaire    Fear of Current or Ex-Partner: No    Emotionally Abused: No    Physically Abused: No    Sexually Abused: No    Outpatient Medications Prior to Visit  Medication Sig Dispense Refill   ALPRAZolam (XANAX) 1 MG tablet Take 1 mg by mouth 2 (two) times daily as needed for anxiety (panic attack).     bictegravir-emtricitabine-tenofovir AF (BIKTARVY) 50-200-25 MG TABS tablet Take 1 tablet by mouth daily. 30 tablet 11   Blood Pressure Monitoring (OMRON 3 SERIES BP MONITOR) DEVI Use as directed. 1 each 0   buPROPion (WELLBUTRIN XL) 300 MG 24 hr tablet Take 300 mg by mouth daily.     busPIRone (BUSPAR) 10 MG tablet Take 10 mg by mouth 3 (three) times daily.     carvedilol (COREG) 25 MG tablet TAKE ONE TABLET BY MOUTH TWICE A DAY 180 tablet 2   empagliflozin (JARDIANCE) 10 MG TABS tablet Take 1 tablet (10 mg total) by mouth daily. 90 tablet 3   fluconazole (DIFLUCAN) 150 MG tablet  TAKE ONE TABLET BY MOUTH DAILY (Patient not taking: Reported on 11/03/2022) 2 tablet 0   furosemide (LASIX) 40 MG tablet Take 1 tablet (40 mg total) by mouth daily. Take twice daily for 3 days then once daily 90 tablet 3   guaiFENesin-dextromethorphan (ROBITUSSIN DM) 100-10 MG/5ML syrup Take 5 mLs by mouth every 4 (four) hours as needed for cough. 118 mL 0   ondansetron (ZOFRAN) 4 MG tablet TAKE ONE TABLET BY MOUTH EVERY 6 HOURS  AS NEEDED FOR NAUSEA OR VOMITING 30 tablet 0   potassium chloride SA (KLOR-CON M20) 20 MEQ tablet Take 1 tablet (20 mEq total) by mouth daily for 3 days. 10 tablet 0   sacubitril-valsartan (ENTRESTO) 97-103 MG Take 1 tablet by mouth 2 (two) times daily. 180 tablet 3   Semaglutide, 2 MG/DOSE, (OZEMPIC, 2 MG/DOSE,) 8 MG/3ML SOPN INJECT TWO MG AS DIRECTED ONCE A WEEK 3 mL 0   sertraline (ZOLOFT) 100 MG tablet Take by mouth daily.     traZODone (DESYREL) 50 MG tablet Take 50-100 mg by mouth at bedtime as needed for sleep.  1   atorvastatin (LIPITOR) 10 MG tablet TAKE ONE TABLET BY MOUTH DAILY 30 tablet 0   Accu-Chek FastClix Lancets MISC 1 Units by Does not apply route 4 (four) times daily as needed. (Patient not taking: Reported on 11/09/2023) 102 each 12   albuterol (VENTOLIN HFA) 108 (90 Base) MCG/ACT inhaler Inhale 1-2 puffs into the lungs every 6 (six) hours as needed for wheezing or shortness of breath. (Patient not taking: Reported on 11/09/2023) 1 each 0   benzonatate (TESSALON) 100 MG capsule Take 1 capsule (100 mg total) by mouth every 8 (eight) hours. (Patient not taking: Reported on 11/09/2023) 21 capsule 0   Blood Glucose Monitoring Suppl (ACCU-CHEK GUIDE ME) w/Device KIT 1 Units by Does not apply route 4 (four) times daily as needed. (Patient not taking: Reported on 11/09/2023) 1 kit 0   Blood Pressure Monitoring (BLOOD PRESSURE CUFF) MISC 1 Units by Does not apply route daily. Please dispense on EXTRA LARGE (up to 52cm) blood pressure cuff kit (Patient not taking:  Reported on 11/09/2023) 1 each 0   GLOBAL EASE INJECT PEN NEEDLES 32G X 4 MM MISC USE AS DIRECTED TO ADMINISTER INSULIN THREE TIMES DAILY BEFORE MEALS (Patient not taking: Reported on 11/09/2023) 100 each 9   glucose blood (ACCU-CHEK GUIDE) test strip Use 3 times a day. Dx code: E11.9 (Patient not taking: Reported on 11/09/2023) 100 each 12   metroNIDAZOLE (METROGEL) 0.75 % gel Apply 1 Application topically 2 (two) times daily. (Patient not taking: Reported on 02/09/2023) 45 g 0   mupirocin ointment (BACTROBAN) 2 % Apply 1 Application topically 2 (two) times daily. (Patient not taking: Reported on 07/28/2022) 22 g 0   No facility-administered medications prior to visit.    Allergies  Allergen Reactions   Percocet [Oxycodone-Acetaminophen] Hives, Itching and Rash   Spironolactone Photosensitivity    Patient reports blurred vision and halos around objects   Latex Rash    ROS Review of Systems  Constitutional:  Negative for appetite change, chills, fatigue and fever.  HENT:  Negative for congestion, postnasal drip, rhinorrhea and sneezing.   Respiratory:  Positive for shortness of breath. Negative for cough and wheezing.   Cardiovascular:  Negative for chest pain, palpitations and leg swelling.  Gastrointestinal:  Negative for abdominal pain, constipation, nausea and vomiting.  Genitourinary:  Negative for difficulty urinating, dysuria, flank pain and frequency.  Musculoskeletal:  Negative for arthralgias, back pain, joint swelling and myalgias.  Skin:  Negative for color change, pallor, rash and wound.  Neurological:  Negative for dizziness, facial asymmetry, weakness, numbness and headaches.  Psychiatric/Behavioral:  Positive for sleep disturbance. Negative for behavioral problems, confusion, self-injury and suicidal ideas.       Objective:    Physical Exam Vitals and nursing note reviewed.  Constitutional:      General: She is not in acute distress.    Appearance: Normal  appearance.  She is obese. She is not ill-appearing, toxic-appearing or diaphoretic.  HENT:     Mouth/Throat:     Mouth: Mucous membranes are moist.     Pharynx: Oropharynx is clear. No oropharyngeal exudate or posterior oropharyngeal erythema.  Eyes:     General: No scleral icterus.       Right eye: No discharge.        Left eye: No discharge.     Extraocular Movements: Extraocular movements intact.     Conjunctiva/sclera: Conjunctivae normal.  Cardiovascular:     Rate and Rhythm: Normal rate and regular rhythm.     Pulses: Normal pulses.     Heart sounds: Normal heart sounds. No murmur heard.    No friction rub. No gallop.  Pulmonary:     Effort: Pulmonary effort is normal. No respiratory distress.     Breath sounds: Normal breath sounds. No stridor. No wheezing, rhonchi or rales.  Chest:     Chest wall: No tenderness.  Abdominal:     General: There is no distension.     Palpations: Abdomen is soft.     Tenderness: There is no abdominal tenderness. There is no right CVA tenderness, left CVA tenderness or guarding.  Musculoskeletal:        General: No swelling, tenderness, deformity or signs of injury.     Right lower leg: No edema.     Left lower leg: No edema.  Skin:    General: Skin is warm and dry.     Capillary Refill: Capillary refill takes less than 2 seconds.     Coloration: Skin is not jaundiced or pale.     Findings: No bruising, erythema or lesion.  Neurological:     Mental Status: She is alert and oriented to person, place, and time.     Motor: No weakness.     Coordination: Coordination normal.     Gait: Gait normal.  Psychiatric:        Mood and Affect: Mood normal.        Behavior: Behavior normal.        Thought Content: Thought content normal.        Judgment: Judgment normal.     BP (!) 109/54   Pulse 81   Temp (!) 97.4 F (36.3 C)   Wt 239 lb (108.4 kg)   LMP 10/05/2023 (Approximate)   SpO2 100%   BMI 46.68 kg/m  Wt Readings from Last 3 Encounters:   11/09/23 239 lb (108.4 kg)  11/08/23 246 lb 9.6 oz (111.9 kg)  08/19/23 231 lb (104.8 kg)    Lab Results  Component Value Date   TSH 1.42 07/08/2016   Lab Results  Component Value Date   WBC 6.9 11/08/2023   HGB 11.1 11/08/2023   HCT 32.7 (L) 11/08/2023   MCV 95 11/08/2023   PLT 233 11/08/2023   Lab Results  Component Value Date   NA 138 11/08/2023   K 4.7 11/08/2023   CO2 20 11/08/2023   GLUCOSE 109 (H) 11/08/2023   BUN 24 11/08/2023   CREATININE 1.10 (H) 11/08/2023   BILITOT 0.4 08/19/2023   ALKPHOS 66 08/19/2023   AST 9 08/19/2023   ALT 9 08/19/2023   PROT 7.4 08/19/2023   ALBUMIN 4.1 08/19/2023   CALCIUM 8.8 11/08/2023   ANIONGAP 10 10/22/2023   EGFR 64 11/08/2023   Lab Results  Component Value Date   CHOL 183 12/10/2022   Lab Results  Component Value Date  HDL 48 12/10/2022   Lab Results  Component Value Date   LDLCALC 115 (H) 12/10/2022   Lab Results  Component Value Date   TRIG 111 12/10/2022   Lab Results  Component Value Date   CHOLHDL 3.8 12/10/2022   Lab Results  Component Value Date   HGBA1C 5.3 08/19/2023      Assessment & Plan:   Problem List Items Addressed This Visit       Cardiovascular and Mediastinum   HTN (hypertension) (Chronic)   Continue Entresto 97-1.3 mg 1 tablet twice daily, furosemide 40 mg daily, Need to take medications daily as ordered discussed Encouraged to maintain close follow-up with cardiology DASH diet and commitment to daily physical activity for a minimum of 30 minutes discussed and encouraged, as a part of hypertension management. The importance of attaining a healthy weight is also discussed.     11/09/2023   10:37 AM 11/08/2023   11:30 AM 11/08/2023   10:46 AM 10/22/2023   10:30 PM 10/22/2023    8:32 PM 10/22/2023    8:30 PM 08/19/2023   11:58 AM  BP/Weight  Systolic BP 109 142 150 162 155 155 120  Diastolic BP 54 82 96 77 73 73 67  Wt. (Lbs) 239  246.6    231  BMI 46.68 kg/m2  48.16 kg/m2     45.11 kg/m2           Relevant Medications   atorvastatin (LIPITOR) 10 MG tablet   Chronic systolic congestive heart failure (HCC)   Continue Entresto 97-1.3 mg 1 tablet twice daily, furosemide 40 mg daily, Jardiance 10 mg daily Need to take medications daily as ordered discussed Encouraged to maintain close follow-up with cardiology      Relevant Medications   atorvastatin (LIPITOR) 10 MG tablet     Endocrine   Type 2 diabetes mellitus with complication, with long-term current use of insulin (HCC) - Primary   Lab Results  Component Value Date   HGBA1C 5.3 08/19/2023  Well-controlled on Ozempic 2 mg once weekly injection Patient denies hypoglycemia Patient counseled on low-carb diet Encouraged engage in regular moderate exercises at least 150 minutes weekly as tolerated Checking urine microalbumin labs direct LDL       Relevant Medications   atorvastatin (LIPITOR) 10 MG tablet   Other Relevant Orders   Microalbumin/Creatinine Ratio, Urine   Direct LDL     Other   Anxiety disorder (Chronic)      11/09/2023   10:43 AM 12/18/2021    3:32 PM 01/06/2016    7:36 AM  GAD 7 : Generalized Anxiety Score  Nervous, Anxious, on Edge 1 1 2   Control/stop worrying 3 1 2   Worry too much - different things 3 1 2   Trouble relaxing 3 1 2   Restless 0 0 3  Easily annoyed or irritable 3 0 3  Afraid - awful might happen 3 1 2   Total GAD 7 Score 16 5 16   Anxiety Difficulty Very difficult Somewhat difficult Not difficult at all  Followed by psychiatrist, has upcoming appointment with therapist Patient encouraged to maintain close follow-up with her treatment team Continue Zoloft 100 mg daily, trazodone 50 mg to 100 mg at bedtime as needed, buspirone 10 mg 3 times daily,, Wellbutrin 100 mg daily        Moderately severe depression   Flowsheet Row Office Visit from 11/09/2023 in [facility]  PHQ-9 Total Score 15  Followed  by psychiatrist, has upcoming appointment with therapist Patient encouraged to maintain close follow-up with her treatment team Continue Zoloft 100 mg daily, trazodone 50 mg to 100 mg at bedtime as needed, buspirone 10 mg 3 times daily,, Wellbutrin 100 mg daily      Tobacco dependence   Patient congratulated on her efforts at smoking cessation ,patient encouraged to continue to abstain from smoking cigarettes      Dyslipidemia, goal LDL below 70   Lab Results  Component Value Date   CHOL 183 12/10/2022   HDL 48 12/10/2022   LDLCALC 115 (H) 12/10/2022   TRIG 111 12/10/2022   CHOLHDL 3.8 12/10/2022  Currently on atorvastatin 10 mg daily Continue current medication Checking direct LDL      Relevant Medications   atorvastatin (LIPITOR) 10 MG tablet   Insomnia   Continue trazodone 50 to 100 mg at bedtime as needed       Meds ordered this encounter  Medications   atorvastatin (LIPITOR) 10 MG tablet    Sig: Take 1 tablet (10 mg total) by mouth daily.    Dispense:  90 tablet    Refill:  0    First request, patient must schedule office visit for future refills. Patient is past due for follow up.    Follow-up: Return in about 3 months (around 02/07/2024).    Donell Beers, FNP

## 2023-11-09 NOTE — Progress Notes (Signed)
Follow-up BMET and BNP per Alden Server, due around 11/23/23. Patient notified and verbalized understanding.  Lab ordered released to [facility].

## 2023-11-09 NOTE — Assessment & Plan Note (Signed)
Patient congratulated on her efforts at smoking cessation ,patient encouraged to continue to abstain from smoking cigarettes

## 2023-11-09 NOTE — Assessment & Plan Note (Signed)
Lab Results  Component Value Date   HGBA1C 5.3 08/19/2023  Well-controlled on Ozempic 2 mg once weekly injection Patient denies hypoglycemia Patient counseled on low-carb diet Encouraged engage in regular moderate exercises at least 150 minutes weekly as tolerated Checking urine microalbumin labs direct LDL

## 2023-11-09 NOTE — Assessment & Plan Note (Signed)
    11/09/2023   10:43 AM 12/18/2021    3:32 PM 01/06/2016    7:36 AM  GAD 7 : Generalized Anxiety Score  Nervous, Anxious, on Edge 1 1 2   Control/stop worrying 3 1 2   Worry too much - different things 3 1 2   Trouble relaxing 3 1 2   Restless 0 0 3  Easily annoyed or irritable 3 0 3  Afraid - awful might happen 3 1 2   Total GAD 7 Score 16 5 16   Anxiety Difficulty Very difficult Somewhat difficult Not difficult at all  Followed by psychiatrist, has upcoming appointment with therapist Patient encouraged to maintain close follow-up with her treatment team Continue Zoloft 100 mg daily, trazodone 50 mg to 100 mg at bedtime as needed, buspirone 10 mg 3 times daily,, Wellbutrin 100 mg daily

## 2023-11-09 NOTE — Assessment & Plan Note (Addendum)
Continue Entresto 97-1.3 mg 1 tablet twice daily, furosemide 40 mg daily, Need to take medications daily as ordered discussed Encouraged to maintain close follow-up with cardiology DASH diet and commitment to daily physical activity for a minimum of 30 minutes discussed and encouraged, as a part of hypertension management. The importance of attaining a healthy weight is also discussed.     11/09/2023   10:37 AM 11/08/2023   11:30 AM 11/08/2023   10:46 AM 10/22/2023   10:30 PM 10/22/2023    8:32 PM 10/22/2023    8:30 PM 08/19/2023   11:58 AM  BP/Weight  Systolic BP 109 142 150 162 155 155 120  Diastolic BP 54 82 96 77 73 73 67  Wt. (Lbs) 239  246.6    231  BMI 46.68 kg/m2  48.16 kg/m2    45.11 kg/m2

## 2023-11-09 NOTE — Assessment & Plan Note (Addendum)
Flowsheet Row Office Visit from 11/09/2023 in [facility]  PHQ-9 Total Score 15      Followed by psychiatrist, has upcoming appointment with therapist Patient encouraged to maintain close follow-up with her treatment team Continue Zoloft 100 mg daily, trazodone 50 mg to 100 mg at bedtime as needed, buspirone 10 mg 3 times daily,, Wellbutrin 100 mg daily

## 2023-11-09 NOTE — Assessment & Plan Note (Signed)
Lab Results  Component Value Date   CHOL 183 12/10/2022   HDL 48 12/10/2022   LDLCALC 115 (H) 12/10/2022   TRIG 111 12/10/2022   CHOLHDL 3.8 12/10/2022  Currently on atorvastatin 10 mg daily Continue current medication Checking direct LDL

## 2023-11-10 ENCOUNTER — Other Ambulatory Visit: Payer: Self-pay | Admitting: Nurse Practitioner

## 2023-11-10 DIAGNOSIS — E785 Hyperlipidemia, unspecified: Secondary | ICD-10-CM

## 2023-11-10 LAB — MICROALBUMIN / CREATININE URINE RATIO
Creatinine, Urine: 16.8 mg/dL
Microalb/Creat Ratio: 277 mg/g{creat} — ABNORMAL HIGH (ref 0–29)
Microalbumin, Urine: 46.6 ug/mL

## 2023-11-10 LAB — LDL CHOLESTEROL, DIRECT: LDL Direct: 101 mg/dL — ABNORMAL HIGH (ref 0–99)

## 2023-11-12 ENCOUNTER — Other Ambulatory Visit: Payer: Self-pay | Admitting: Family Medicine

## 2023-11-12 DIAGNOSIS — R112 Nausea with vomiting, unspecified: Secondary | ICD-10-CM

## 2023-11-12 NOTE — Telephone Encounter (Signed)
Please advise in fola absence. Kh

## 2023-11-22 NOTE — Progress Notes (Signed)
Called pt  No answer to schedule 6 week follow up. KH

## 2023-12-01 ENCOUNTER — Telehealth (HOSPITAL_COMMUNITY): Payer: Self-pay | Admitting: *Deleted

## 2023-12-01 NOTE — Telephone Encounter (Signed)
Patient given detailed instructions per Myocardial Perfusion Study Information Sheet for the test on 12/06/23 at 11:00. Patient notified to arrive 15 minutes early and that it is imperative to arrive on time for appointment to keep from having the test rescheduled.  If you need to cancel or reschedule your appointment, please call the office within 24 hours of your appointment. . Patient verbalized understanding.Sherry Hall

## 2023-12-06 ENCOUNTER — Ambulatory Visit (HOSPITAL_BASED_OUTPATIENT_CLINIC_OR_DEPARTMENT_OTHER): Payer: Medicare Other

## 2023-12-06 ENCOUNTER — Ambulatory Visit (HOSPITAL_COMMUNITY): Payer: Medicare Other | Attending: Cardiology

## 2023-12-06 DIAGNOSIS — I5022 Chronic systolic (congestive) heart failure: Secondary | ICD-10-CM | POA: Diagnosis not present

## 2023-12-06 DIAGNOSIS — I10 Essential (primary) hypertension: Secondary | ICD-10-CM

## 2023-12-06 DIAGNOSIS — R0789 Other chest pain: Secondary | ICD-10-CM | POA: Insufficient documentation

## 2023-12-06 DIAGNOSIS — E782 Mixed hyperlipidemia: Secondary | ICD-10-CM

## 2023-12-06 DIAGNOSIS — R0602 Shortness of breath: Secondary | ICD-10-CM | POA: Insufficient documentation

## 2023-12-06 LAB — ECHOCARDIOGRAM COMPLETE
Area-P 1/2: 4.41 cm2
Height: 60 in
S' Lateral: 4.98 cm
Weight: 3824 [oz_av]

## 2023-12-06 MED ORDER — REGADENOSON 0.4 MG/5ML IV SOLN
0.4000 mg | Freq: Once | INTRAVENOUS | Status: AC
  Administered 2023-12-06: 0.4 mg via INTRAVENOUS

## 2023-12-06 MED ORDER — TECHNETIUM TC 99M TETROFOSMIN IV KIT
31.7000 | PACK | Freq: Once | INTRAVENOUS | Status: AC | PRN
  Administered 2023-12-06: 31.7 via INTRAVENOUS

## 2023-12-07 ENCOUNTER — Telehealth: Payer: Self-pay

## 2023-12-07 ENCOUNTER — Ambulatory Visit (HOSPITAL_COMMUNITY): Payer: Medicare Other | Attending: Nurse Practitioner

## 2023-12-07 DIAGNOSIS — E782 Mixed hyperlipidemia: Secondary | ICD-10-CM

## 2023-12-07 LAB — MYOCARDIAL PERFUSION IMAGING
LV dias vol: 123 mL (ref 46–106)
LV sys vol: 69 mL
Nuc Stress EF: 44 %
Peak HR: 110 {beats}/min
Rest BP: 92 mm[Hg]
Rest HR: 1.8 {beats}/min
Rest Nuclear Isotope Dose: 32.8 mCi
SDS: 8
SRS: 14
SSS: 23
ST Depression (mm): 0 mm
Stress Nuclear Isotope Dose: 31.7 mCi
TID: 0.92

## 2023-12-07 MED ORDER — TECHNETIUM TC 99M TETROFOSMIN IV KIT
32.8000 | PACK | Freq: Once | INTRAVENOUS | Status: AC | PRN
  Administered 2023-12-07: 32.8 via INTRAVENOUS

## 2023-12-07 MED ORDER — ASPIRIN 81 MG PO TBEC: 81.0000 mg | DELAYED_RELEASE_TABLET | Freq: Every day | ORAL | Status: AC

## 2023-12-07 MED ORDER — ATORVASTATIN CALCIUM 40 MG PO TABS: 40.0000 mg | ORAL_TABLET | Freq: Every day | ORAL | 3 refills | Status: DC

## 2023-12-07 NOTE — Telephone Encounter (Signed)
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  There is also abnormal wall motion of the heart in that location that is also consistent with infarction.  In order to prevent progression we will advise that you work on lifestyle modifications including smoking cessation, dietary changes, and regular physical activity.  -Please start ASA 81 mg daily and please increase atorvastatin to 40 mg daily.   -Please order LFT and lipids in 8 weeks.  Please let us know if you are experiencing further shortness of breath or chest discomfort.   Robin Searing, NP

## 2023-12-07 NOTE — Telephone Encounter (Signed)
 Spoke with patient concerning recent stress test, patient agrees with care plan changes. Sent in increased dose of atorvastatin 40 mg daily and aspirin 81 daily. Patient to have LFT and Lipids completed on or after April 15, order placed and released. Patient made aware that these labs need to be fasting. 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  There is also abnormal wall motion of the heart in that location that is also consistent with infarction.   In order to prevent progression we will advise that you work on lifestyle modifications including smoking cessation, dietary changes, and regular physical activity.   -Please start ASA 81 mg daily and please increase atorvastatin to 40 mg daily.   -Please order LFT and lipids in 8 weeks.  Please let us know if you are experiencing further shortness of breath or chest discomfort.     Robin Searing, NP

## 2023-12-28 ENCOUNTER — Other Ambulatory Visit: Payer: Self-pay

## 2023-12-28 MED ORDER — OZEMPIC (2 MG/DOSE) 8 MG/3ML ~~LOC~~ SOPN: 3.0000 mL | PEN_INJECTOR | SUBCUTANEOUS | 2 refills | Status: DC

## 2023-12-30 NOTE — Progress Notes (Unsigned)
 Cardiology Office Note    Patient Name: Sherry Hall Date of Encounter: 12/31/2023  Primary Care Provider:  Donell Beers, FNP Primary Cardiologist:  Reatha Harps, MD Primary Electrophysiologist: None   Past Medical History    Past Medical History:  Diagnosis Date   Anemia    H/O   Anxiety    Depression    Diabetes mellitus type 1 (HCC)    GERD (gastroesophageal reflux disease)    HIV (human immunodeficiency virus infection) (HCC) 2011   Hypertension    Type 2 diabetes mellitus (HCC)     History of Present Illness  Sherry Hall is a 43 y.o. female with a PMH of HFrEF, NICM DM type II, obesity, OSA, HLD, HIV, HTN who presents today for 55-month follow-up.  Sherry Hall was last seen on 11/08/2023 for post ED follow-up.  During her visit she reported severe shortness of breath and also experienced chest heaviness when lying flat.  Also endorsed a recent weight gain of 15 pounds.  Her blood pressure was also elevated at 150/96 and was 140/82 on recheck.  She had Lasix increased to 40 mg twice daily x 3 days and then back to 20 mg daily.  Spironolactone was discontinued due to blurry vision and patient completed a Lexiscan Myoview that showed no evidence of ischemia but showed abnormal wall motion consistent with infarction.  She had atorvastatin increased to 40 mg daily and ASA 81 mg added to her daily regimen.  TTE was updated that showed EF of 45-50% unchanged from prior 2D echo in 2022.  Sherry Hall presents today for 11-month follow-up. Since the last visit, the patient has been experiencing persistent fatigue, which she attributes to her heart not pumping at 100%. The patient has been adhering to her prescribed medication regimen, which includes cholesterol medication, baby aspirin, and Lasix. Despite this, the patient has gained weight and has been struggling with diet and exercise. The patient has also been experiencing constipation, which she manages with  Ozempic for her diabetes. The patient's blood pressure is well controlled, and she has not experienced any recent episodes of chest pain or heaviness. Patient denies chest pain, palpitations, dyspnea, PND, orthopnea, nausea, vomiting, dizziness, syncope, edema, weight gain, or early satiety.  Review of Systems  Please see the history of present illness.    All other systems reviewed and are otherwise negative except as noted above.  Physical Exam    Wt Readings from Last 3 Encounters:  12/31/23 243 lb (110.2 kg)  12/06/23 239 lb (108.4 kg)  11/09/23 239 lb (108.4 kg)   VS: Vitals:   12/31/23 1025  BP: 102/78  Pulse: 91  SpO2: 94%  ,Body mass index is 47.46 kg/m. GEN: Well nourished, well developed in no acute distress Neck: No JVD; No carotid bruits Pulmonary: Clear to auscultation without rales, wheezing or rhonchi  Cardiovascular: Normal rate. Regular rhythm. Normal S1. Normal S2.   Murmurs: There is no murmur.  ABDOMEN: Soft, non-tender, non-distended EXTREMITIES:  No edema; No deformity   EKG/LABS/ Recent Cardiac Studies   ECG personally reviewed by me today -none completed today  Risk Assessment/Calculations:          Lab Results  Component Value Date   WBC 6.9 11/08/2023   HGB 11.1 11/08/2023   HCT 32.7 (L) 11/08/2023   MCV 95 11/08/2023   PLT 233 11/08/2023   Lab Results  Component Value Date   CREATININE 1.10 (H) 11/08/2023   BUN 24 11/08/2023  NA 138 11/08/2023   K 4.7 11/08/2023   CL 106 11/08/2023   CO2 20 11/08/2023   Lab Results  Component Value Date   CHOL 183 12/10/2022   HDL 48 12/10/2022   LDLCALC 115 (H) 12/10/2022   LDLDIRECT 101 (H) 11/09/2023   TRIG 111 12/10/2022   CHOLHDL 3.8 12/10/2022    Lab Results  Component Value Date   HGBA1C 5.3 08/19/2023   Assessment & Plan    1.  HFrEF: -2D echo updated 11/2023 with EF of 45-50% with global hypokinesis and grade 1 DD and no LVH or significant valve abnormalities present. -Patient  is euvolemic on examination today and endorses some fatigue since beginning current regimen. -We will reduce carvedilol to 12.5 mg twice daily -Continue current GDMT with Jardiance 10 mg daily, Entresto 97/103 mg daily, Lasix 40 mg daily, potassium 20 mEq -CMP and BMET today -We will recheck echo in 3 months. -Low sodium diet, fluid restriction <2L, and daily weights encouraged. Educated to contact our office for weight gain of 2 lbs overnight or 5 lbs in one week.   2.  Hyperlipidemia: -Patient's last LDL cholesterol was 101 -We will repeat LFTs and lipids in 3 weeks -Continue Lipitor 40 mg daily  3.  Primary HTN: -Patient's blood pressure today was stable at 102/78 -Continue carvedilol 6.25 mg twice daily -Entresto 97/23 mg twice daily  4.  Shortness of breath: -Patient reports no recurrence to shortness of breath with current medication adjustments. -Continue Lasix 40 mg daily  5.Obesity -Difficulty losing weight despite Ozempic. Current weight 243 lbs, highest in a year. Advised dietary changes and Metamucil for satiety and constipation. - Refer to health and wellness coach for weight management and dietary advice. - Recommend daily weight monitoring. - Advise on dietary changes including portion control, DASH diet, and Mediterranean diet. - Recommend Metamucil for satiety and constipation management.  Disposition: Follow-up with Reatha Harps, MD or APP in 3 months    Signed, Napoleon Form, Leodis Rains, NP 12/31/2023, 10:37 AM [facility]

## 2023-12-31 ENCOUNTER — Ambulatory Visit: Payer: Medicare Other | Attending: Nurse Practitioner | Admitting: Nurse Practitioner

## 2023-12-31 ENCOUNTER — Encounter: Payer: Self-pay | Admitting: Nurse Practitioner

## 2023-12-31 VITALS — BP 102/78 | HR 91 | Ht 60.0 in | Wt 243.0 lb

## 2023-12-31 DIAGNOSIS — I10 Essential (primary) hypertension: Secondary | ICD-10-CM | POA: Diagnosis present

## 2023-12-31 DIAGNOSIS — I5022 Chronic systolic (congestive) heart failure: Secondary | ICD-10-CM | POA: Insufficient documentation

## 2023-12-31 DIAGNOSIS — R0602 Shortness of breath: Secondary | ICD-10-CM | POA: Diagnosis present

## 2023-12-31 DIAGNOSIS — E782 Mixed hyperlipidemia: Secondary | ICD-10-CM | POA: Diagnosis present

## 2023-12-31 MED ORDER — CARVEDILOL 12.5 MG PO TABS: 12.5000 mg | ORAL_TABLET | Freq: Two times a day (BID) | ORAL | 3 refills | Status: DC

## 2023-12-31 NOTE — Patient Instructions (Addendum)
 Medication Instructions:  DECREASE Coreg to 12.5mg  Take 1 tablet twice a day *If you need a refill on your cardiac medications before your next appointment, please call your pharmacy*   Lab Work: TODAY-BMET & BNP If you have labs (blood work) drawn today and your tests are completely normal, you will receive your results only by: MyChart Message (if you have MyChart) OR A paper copy in the mail If you have any lab test that is abnormal or we need to change your treatment, we will call you to review the results.   Testing/Procedures: Your physician has requested that you have an echocardiogram. Echocardiography is a painless test that uses sound waves to create images of your heart. It provides your doctor with information about the size and shape of your heart and how well your heart's chambers and valves are working. This procedure takes approximately one hour. There are no restrictions for this procedure. Please do NOT wear cologne, perfume, aftershave, or lotions (deodorant is allowed). Please arrive 15 minutes prior to your appointment time.  Please note: We ask at that you not bring children with you during ultrasound (echo/ vascular) testing. Due to room size and safety concerns, children are not allowed in the ultrasound rooms during exams. Our front office staff cannot provide observation of children in our lobby area while testing is being conducted. An adult accompanying a patient to their appointment will only be allowed in the ultrasound room at the discretion of the ultrasound technician under special circumstances. We apologize for any inconvenience. SCHEDULE IN 3 MONTHS  Follow-Up: At [facility], you and your health needs are our priority.  As part of our continuing mission to provide you with exceptional heart care, we have created designated Provider Care Teams.  These Care Teams include your primary Cardiologist (physician) and Advanced Practice Providers (APPs -   Physician Assistants and Nurse Practitioners) who all work together to provide you with the care you need, when you need it.  We recommend signing up for the patient portal called "MyChart".  Sign up information is provided on this After Visit Summary.  MyChart is used to connect with patients for Virtual Visits (Telemedicine).  Patients are able to view lab/test results, encounter notes, upcoming appointments, etc.  Non-urgent messages can be sent to your provider as well.   To learn more about what you can do with MyChart, go to [email or web address].    Your next appointment:   3 month(s)  Provider:   Robin Searing, NP  Other Instructions You have been referred to Care Guide

## 2024-01-01 LAB — BASIC METABOLIC PANEL
BUN/Creatinine Ratio: 15 (ref 9–23)
BUN: 24 mg/dL (ref 6–24)
CO2: 21 mmol/L (ref 20–29)
Calcium: 9 mg/dL (ref 8.7–10.2)
Chloride: 106 mmol/L (ref 96–106)
Creatinine, Ser: 1.58 mg/dL — ABNORMAL HIGH (ref 0.57–1.00)
Glucose: 84 mg/dL (ref 70–99)
Potassium: 4.6 mmol/L (ref 3.5–5.2)
Sodium: 140 mmol/L (ref 134–144)
eGFR: 41 mL/min/{1.73_m2} — ABNORMAL LOW (ref >59)

## 2024-01-01 LAB — PRO B NATRIURETIC PEPTIDE: NT-Pro BNP: 381 pg/mL — ABNORMAL HIGH (ref 0–130)

## 2024-01-03 ENCOUNTER — Other Ambulatory Visit: Payer: Self-pay

## 2024-01-03 DIAGNOSIS — Z79899 Other long term (current) drug therapy: Secondary | ICD-10-CM

## 2024-01-03 DIAGNOSIS — I5022 Chronic systolic (congestive) heart failure: Secondary | ICD-10-CM

## 2024-01-03 MED ORDER — FUROSEMIDE 20 MG PO TABS: 20.0000 mg | ORAL_TABLET | Freq: Every day | ORAL | 1 refills | Status: DC

## 2024-01-04 ENCOUNTER — Telehealth: Payer: Self-pay

## 2024-01-04 DIAGNOSIS — Z0000 Encounter for general adult medical examination without abnormal findings: Secondary | ICD-10-CM

## 2024-01-04 NOTE — Telephone Encounter (Signed)
 Called patient per health coaching referral from provider regarding healthy eating. Patient expressed interest in participating in the program over the phone. Patient has been scheduled for her initial telephonic health coaching session on 01/06/24 @ 1pm.   Renaee Munda, MS, ERHD, [facility]  Care Guide, Health & Wellness Coach [address] [place] [address] Telephone: [phone] Email: [email or web address]

## 2024-01-06 ENCOUNTER — Telehealth: Payer: Self-pay

## 2024-01-06 ENCOUNTER — Ambulatory Visit: Payer: Self-pay

## 2024-01-06 DIAGNOSIS — Z0000 Encounter for general adult medical examination without abnormal findings: Secondary | ICD-10-CM

## 2024-01-06 NOTE — Telephone Encounter (Signed)
 Called patient per scheduled health coaching session. Patient did not answer. Was not able to leave message due to voicemail not being setup.   Renaee Munda, MS, ERHD, [facility]  Care Guide, Health & Wellness Coach [facility] Heart & Vascular Care Navigation Telephone: [phone] Email: [email or web address]

## 2024-01-10 ENCOUNTER — Other Ambulatory Visit: Payer: Self-pay | Admitting: Family Medicine

## 2024-01-10 DIAGNOSIS — R112 Nausea with vomiting, unspecified: Secondary | ICD-10-CM

## 2024-01-17 ENCOUNTER — Other Ambulatory Visit: Payer: Self-pay

## 2024-01-17 ENCOUNTER — Ambulatory Visit (INDEPENDENT_AMBULATORY_CARE_PROVIDER_SITE_OTHER): Payer: Medicare Other | Admitting: Infectious Diseases

## 2024-01-17 ENCOUNTER — Encounter: Payer: Self-pay | Admitting: Infectious Diseases

## 2024-01-17 VITALS — BP 103/72 | HR 84 | Temp 97.6°F | Ht 60.0 in | Wt 245.0 lb

## 2024-01-17 DIAGNOSIS — K5900 Constipation, unspecified: Secondary | ICD-10-CM

## 2024-01-17 DIAGNOSIS — B20 Human immunodeficiency virus [HIV] disease: Secondary | ICD-10-CM | POA: Diagnosis present

## 2024-01-17 DIAGNOSIS — I509 Heart failure, unspecified: Secondary | ICD-10-CM | POA: Diagnosis not present

## 2024-01-17 NOTE — Patient Instructions (Addendum)
 Start Miralax one cap full once a day in 6 oz of fluid to help with the constipation. I suspect it's a side effect from the ozempic.   When you see your new doctor in April see how the Ozempic and constipation is going they may need to consider Joycelyn Man   Will plan to see you back in 6 months (September / October) for routine follow up

## 2024-01-17 NOTE — Progress Notes (Signed)
 Name: Sherry Hall  ZOX:[id number]   DOB: 1981-03-04   PCP: Donell Beers, FNP    Brief History ID: Sherry Hall is a 43 y.o. female with HIV disease. Dx 2011. Intermittently on medications. Co morbidities: T1DM, HTN, Hyperlipidemia, obesity. HIV Risk: heterosexual Hx: OIs: none known  Hep B sAg (not on file); Hep BsAb (neg); Hep B cAb (not on file); Hep C (not on file); Hep A (not on file); HLA B*5701 (-); Quantiferon (-)  Previous Regimens: Complera Odefsey Biktarvy    Genotypes: 2017 - no significant mutations PR/RT  Subjective   Chief Complaint  Patient presents with   Follow-up      Discussed the use of AI scribe software for clinical note transcription with the patient, who gave verbal consent to proceed.  History of Present Illness   Sherry Hall is a 43 year old female here today for routine HIV follow up care.   A couple of months ago, she experienced a heart episode that led to an emergency department - was not sure if it was acute heart or lung problem. Fluid accumulation in the abdomen was noted, and she underwent several follow-up appointments. She attributes the episode to a flu-like illness, experiencing breathing problems and a cough. Although cleared of a heart event, she continued to have breathing difficulties, significantly limiting her activities. An echocardiogram and a heart stress test have been ordered - she was cardiology recently for GDMT of heart failure. She feels about 75% better now but still experiences shortness of breath with exertion, such as walking uphill.  She is on Ozempic for diabetes management and weight control. After pausing the medication during her heart issues, she resumed it at a lower dose before returning to the higher dose. Despite this, she has not lost weight and describes her appetite as unchanged, feeling she has reached a plateau. She is concerned about weight gain, noting previous weight loss  followed by some regain. She monitors her weight at home and is aware of the challenges of distinguishing between water weight and fat due to her heart failure.  She experiences significant constipation, which she attributes to Ozempic. Constipation can last up to a week, and she uses milk of magnesia for relief, which takes two days to work. The constipation is severe, sometimes resulting in blood when wiping, attributed to passing hard stools. She experiences sharp pains in her side when constipated for extended periods, impacting her daily life.  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 This situation has been stressful, and she closely monitors her son when he is not at school.        Review of Systems  All other systems reviewed and are negative.   Past Medical History:  Diagnosis Date   Anemia    H/O   Anxiety    Depression    Diabetes mellitus type 1 (HCC)    GERD (gastroesophageal reflux disease)    HIV (human immunodeficiency virus infection) (HCC) 2011   Hypertension    Type 2 diabetes mellitus (HCC)        Objective   Observations/Objective: Today's Vitals   01/17/24 1051  BP: 103/72  Pulse: 84  Temp: 97.6 F (36.4 C)  TempSrc: Temporal  SpO2: 100%  Weight: 245 lb (111.1 kg)  Height: 5' (1.524 m)  PainSc: 0-No pain    Body mass index is 47.85 kg/m.   Physical Exam  Constitutional:      Appearance: Normal appearance. She is not ill-appearing.  HENT:     Mouth/Throat:     Mouth: Mucous membranes are moist.     Pharynx: Oropharynx is clear.  Eyes:     General: No scleral icterus. Cardiovascular:     Rate and Rhythm: Normal rate and regular rhythm.  Pulmonary:     Effort: Pulmonary effort is normal.  Neurological:     Mental Status: She is oriented to person, place, and time.  Psychiatric:        Mood and Affect: Mood normal.        Thought  Content: Thought content normal.     HIV 1 RNA Quant (Copies/mL)  Date Value  07/15/2023 <20 (H)  05/12/2022 Not Detected  01/16/2022 33 (H)   CD4 T Cell Abs (/uL)  Date Value  07/15/2023 631  05/12/2022 455  12/12/2020 570    Lab Results  Component Value Date   CREATININE 1.58 (H) 12/31/2023   CREATININE 1.10 (H) 11/08/2023   CREATININE 1.22 (H) 10/22/2023    Lab Results  Component Value Date   WBC 6.9 11/08/2023   HGB 11.1 11/08/2023   HCT 32.7 (L) 11/08/2023   MCV 95 11/08/2023   PLT 233 11/08/2023    Lab Results  Component Value Date   ALT 9 08/19/2023   AST 9 08/19/2023   ALKPHOS 66 08/19/2023   BILITOT 0.4 08/19/2023       Assessment and Plan:    HIV infection - HIV is well-controlled with an undetectable viral load and a CD4 count of 631. Consideration to switch from [facility] to Dovato if kidney function tests change significantly, but currently, no change is needed. Discussed the potential benefits of Dovato in reducing kidney strain if necessary.  No concerns with mood. Pap smear due July 2026. Mammograms to start @ 56 y. No dental needs today. - Continue current HIV regimen with Biktarvy - Monitor kidney function tests - Consider switch to Dovato if kidney function declines  Heart failure - Experienced a heart episode a couple of months ago, initially thought to be related to a flu-like illness. Presented with fluid in the abdomen and breathing difficulties. Underwent echocardiogram and stress tests, showing improvement, currently feeling about 75% better. Experiences shortness of breath with exertion, such as walking uphill. Weight management is challenging with consideration of fluid retention, and she is on Lasix to manage fluid retention. Reinforced to monitor weight changes as an indicator of fluid retention and adjust Lasix accordingly per cardiology's recommendations.  - Continue Lasix for fluid management per cardioloty - Monitor weight changes  to adjust Lasix dosing -She has echo in a few weeks.   Constipation Experiences severe constipation, likely exacerbated by Ozempic, leading to infrequent bowel movements and occasional blood when wiping. Currently uses milk of magnesia but finds it unpredictable. Suggested using Miralax daily to help with regular bowel movements and to soften stools, which may reduce trauma and bleeding. Discussed the potential for Miralax to improve symptoms gradually and the option to increase to twice daily if needed. - Start Miralax once daily - Monitor bowel movement frequency and consistency - Consider increasing Miralax to twice daily if needed - Could consider move to tirzepitide if she cannot tolerate - i told her to talk with her PCP at upcoming visit.      Orders Placed This Encounter  Procedures   HIV 1 RNA quant-no reflex-bld   T-helper cells (CD4) count  RPR   No orders of the defined types were placed in this encounter.  Return in about 6 months (around 07/18/2024).    Rexene Alberts, MSN, NP-C [facility] for Infectious Disease [facility]  [email or web address] Pager: [phone] Office: [phone] [facility] Main Line: [phone]

## 2024-01-18 ENCOUNTER — Telehealth: Payer: Self-pay

## 2024-01-18 DIAGNOSIS — Z0000 Encounter for general adult medical examination without abnormal findings: Secondary | ICD-10-CM

## 2024-01-19 LAB — HIV-1 RNA QUANT-NO REFLEX-BLD
HIV 1 RNA Quant: <20 {copies}/mL — AB
HIV-1 RNA Quant, Log: <1.3 {Log_copies}/mL — AB

## 2024-01-19 LAB — T-HELPER CELLS (CD4) COUNT (NOT AT ARMC)
Absolute CD4: 565 {cells}/uL (ref 490–1740)
CD4 T Helper %: 32 % (ref 30–61)
Total lymphocyte count: 1774 {cells}/uL (ref 850–3900)

## 2024-01-19 LAB — RPR: RPR Ser Ql: NONREACTIVE

## 2024-01-20 ENCOUNTER — Ambulatory Visit (INDEPENDENT_AMBULATORY_CARE_PROVIDER_SITE_OTHER)

## 2024-01-20 DIAGNOSIS — Z0000 Encounter for general adult medical examination without abnormal findings: Secondary | ICD-10-CM

## 2024-01-20 NOTE — Progress Notes (Signed)
 HEALTH & WELLNESS COACHING INITIAL INTAKE   Appointment Outcome: Completed, Session #: Initial Start time: 10:06am   End time: 11:22am  Total Mins: 76 minutes     What are the Patient's goals from Coaching? Develop healthy eating habits to reduce the imbalance in day-to-day food consumption to aid in lowering her LDL, control blood pressure and Type II diabetes.   Why did they seek coaching now? Patient was eating healthy and reduced the amount of food she consumed after starting Ozempic in October 2023, but has now plateau and started regaining weight. Patient would like support/accountability as she works towards developing healthy eating habits to improve her overall health.    Readiness - What stage is the patient in regarding their goal(s)? Patient is in the preparation stage of re-establishing her healthy eating habits that she developed.       Coaching Progress Notes: Patient shared that she is a mother of 3 and is engaged, but eats differently from her family. Patient mentioned that she lacks energy daily and struggles with being able to stay consistent with her meal plan due to having other things come up when care giving. Patient mentioned that it would be easier if she was able to plan meals that her and her children both liked. Patient is concerned that because of her lack of appetite that she may not be eating enough as well.   Patient recalled being told to eat small meals and snacks while taking Ozempic. Patient is currently on the 2mg  dosage. Patient reported that she also has an imbalance in her eating habits due to not having an appetite on some days compared to others. Discussed with patient what she contributes this issue to and patient revealed that when she is experiencing constipation for a week or longer, heartburn, side pain, etc., she is not able to consume food until she is able to relieve herself.   Patient shared that her diet consisted of fruits and  vegetables, but she is mindful of those that are high in water content. Patient reported drinking 3- or 4-16.9-ounce bottles of water per day to help prevention dehydration and alleviate constipation. Patient has reduced any type of foods that worsens constipation such as bread. Patient reported trying vitamin B12 drops as recommended by her PCP to alleviate the constipation.   Patient reports not seeing a nutritionist or gastroenterologist since starting Ozempic. Patient stated that she reads food labels to monitor her sodium and carbohydrate intake due to hypertension and diabetes. Patient shared that she is now having to be mindful of her cholesterol intake due to an elevated LDL.  Patient reported starting weight at 289 pounds and reducing weight to 229 pounds, but began to plateau around July/August of 2024 and is regaining the weight back. Patient's current weight as of 01/17/24 is 245 pounds. Patient desires to start eating on a schedule, plan her meals to help with her being more consistent with eating, continue to read food labels, and address her GI concerns.     Coaching Outcomes Patient was informed that per the American Heart Association the recommended amount of cholesterol to consume per day is 200mg , and 1500mg  of sodium per day for an individual with hypertension.  Offered patient to be connected to a nutritionist/dietician for additional support to help with appropriate eating habits while taking Ozempic. Patient will inform of interest to be referred to a nutritionist/dietician at a later time. Patient stated that she will incorporate MiraLAX as recommended by her PCP  in her drinks to assist with the constipation to see if that helps to improve her appetite, but desires to see someone about her GI issues.   Patient co-created action plan to implement over the next two weeks to aid in re-establishing an eating routine and meal planning to help with healthy and balanced eating to  improve LDL cholesterol, and manage blood pressure and diabetes.    AGREEMENTS SECTION   Overall Goal(s): Develop healthy eating habits to reduce the imbalance in day-to-day food consumption to aid in lowering her LDL, control blood pressure and Type II diabetes.                                    Agreement/Action Steps: Develop healthy eating habits Create an eating schedule based on daily routine Meal plan to incorporate small meals and snacks Use diabetes plate method to assist with planning meals Review American Heart Association for healthy recipe ideas Read food labels to monitor sodium, cholesterol, and carbohydrate intake Per the American Heart Association Recommendations Daily cholesterol intake: 200mg  Daily sodium intake: 1500mg  Daily added sugar intake for women: 25 grams Take MiraLAX as prescribed by PCP     Patient review of Health Coaching Agreement Reviewed Coaching Agreement and Code of Ethics with Patient during initial session. Answered any questions the patient had if any regarding the Coaching Agreement and Code of Ethics. Patient verbally agreed to adhere to the Coaching Agreement and to abide by the Code of Ethics.  Mailed patient with a hard/electronic copy of the Coaching Agreement and Code of Ethics.     Referrals: Sent message to PCP regarding possible referral to a gastroenterologist regarding patient's GI concerns that she expressed that has been ongoing for over 1.5 years.  Resources: Patient was emailed the link to the American Heart Association (heart.org), a copy of the food journal for meal planning. Patient requested a hard copy of this document and it was mailed to her.

## 2024-01-21 ENCOUNTER — Other Ambulatory Visit: Payer: Self-pay | Admitting: Nurse Practitioner

## 2024-01-21 DIAGNOSIS — K5909 Other constipation: Secondary | ICD-10-CM | POA: Insufficient documentation

## 2024-01-21 DIAGNOSIS — R12 Heartburn: Secondary | ICD-10-CM | POA: Insufficient documentation

## 2024-01-21 NOTE — Telephone Encounter (Signed)
 Called patient to reschedule health coaching appointment. Patient has been rescheduled for 4/3 at 10am over the phone. Patient requested that if she does not answer the first time to call back to ensure that she answers the phone.   Renaee Munda, MS, ERHD, [facility]  Care Guide [facility] & Vascular Care Navigation Telephone: [phone] Email: [email or web address]

## 2024-01-27 ENCOUNTER — Ambulatory Visit (HOSPITAL_COMMUNITY): Attending: Internal Medicine

## 2024-01-27 DIAGNOSIS — E782 Mixed hyperlipidemia: Secondary | ICD-10-CM

## 2024-01-27 DIAGNOSIS — I10 Essential (primary) hypertension: Secondary | ICD-10-CM | POA: Diagnosis present

## 2024-01-27 DIAGNOSIS — R0602 Shortness of breath: Secondary | ICD-10-CM | POA: Diagnosis present

## 2024-01-27 DIAGNOSIS — I5022 Chronic systolic (congestive) heart failure: Secondary | ICD-10-CM

## 2024-01-27 LAB — ECHOCARDIOGRAM COMPLETE
Area-P 1/2: 4.54 cm2
S' Lateral: 4.3 cm

## 2024-01-27 MED ORDER — PERFLUTREN LIPID MICROSPHERE
1.0000 mL | INTRAVENOUS | Status: AC | PRN
  Administered 2024-01-27: 2 mL via INTRAVENOUS

## 2024-02-01 ENCOUNTER — Telehealth: Payer: Self-pay | Admitting: Licensed Clinical Social Worker

## 2024-02-01 NOTE — Telephone Encounter (Signed)
 CSW contacted patient to inform that Amy Merlyn Starring is no longer available to provide Health Coaching. Unable to leave message as voicemail not set up. Will attempt again. Aubry Blase, LCSW, CCSW-MCS [phone]

## 2024-02-03 ENCOUNTER — Ambulatory Visit: Payer: Self-pay

## 2024-02-08 ENCOUNTER — Encounter: Payer: Self-pay | Admitting: Nurse Practitioner

## 2024-02-08 ENCOUNTER — Ambulatory Visit (INDEPENDENT_AMBULATORY_CARE_PROVIDER_SITE_OTHER): Payer: Self-pay | Admitting: Nurse Practitioner

## 2024-02-08 VITALS — BP 108/69 | HR 80 | Temp 97.0°F | Wt 244.0 lb

## 2024-02-08 DIAGNOSIS — I5022 Chronic systolic (congestive) heart failure: Secondary | ICD-10-CM

## 2024-02-08 DIAGNOSIS — Z6841 Body Mass Index (BMI) 40.0 and over, adult: Secondary | ICD-10-CM

## 2024-02-08 DIAGNOSIS — R200 Anesthesia of skin: Secondary | ICD-10-CM | POA: Diagnosis not present

## 2024-02-08 DIAGNOSIS — Z794 Long term (current) use of insulin: Secondary | ICD-10-CM | POA: Diagnosis not present

## 2024-02-08 DIAGNOSIS — E785 Hyperlipidemia, unspecified: Secondary | ICD-10-CM | POA: Diagnosis not present

## 2024-02-08 DIAGNOSIS — R5382 Chronic fatigue, unspecified: Secondary | ICD-10-CM

## 2024-02-08 DIAGNOSIS — R202 Paresthesia of skin: Secondary | ICD-10-CM

## 2024-02-08 DIAGNOSIS — K5909 Other constipation: Secondary | ICD-10-CM

## 2024-02-08 DIAGNOSIS — B20 Human immunodeficiency virus [HIV] disease: Secondary | ICD-10-CM

## 2024-02-08 DIAGNOSIS — E118 Type 2 diabetes mellitus with unspecified complications: Secondary | ICD-10-CM

## 2024-02-08 DIAGNOSIS — I10 Essential (primary) hypertension: Secondary | ICD-10-CM

## 2024-02-08 LAB — POCT GLYCOSYLATED HEMOGLOBIN (HGB A1C): Hemoglobin A1C: 5.3 % (ref 4.0–5.6)

## 2024-02-08 MED ORDER — POLYETHYLENE GLYCOL 3350 17 GM/SCOOP PO POWD
17.0000 g | Freq: Every day | ORAL | 1 refills | Status: AC | PRN
Start: 2024-02-08 — End: ?

## 2024-02-08 NOTE — Patient Instructions (Addendum)
.   Chronic constipation  - Ambulatory referral to Gastroenterology - polyethylene glycol powder (GLYCOLAX /MIRALAX ) 17 GM/SCOOP powder; Take 17 g by mouth daily as needed.  Dispense: 3350 g; Refill: 1   Numbness and tingling of both legs below knees  - Ambulatory referral to Neurology   Bilateral hand numbness (Primary)  - Ambulatory referral to Neurology      It is important that you exercise regularly at least 30 minutes 5 times a week as tolerated  Think about what you will eat, plan ahead. Choose " clean, green, fresh or frozen" over canned, processed or packaged foods which are more sugary, salty and fatty. 70 to 75% of food eaten should be vegetables and fruit. Three meals at set times with snacks allowed between meals, but they must be fruit or vegetables. Aim to eat over a 12 hour period , example 7 am to 7 pm, and STOP after  your last meal of the day. Drink water,generally about 64 ounces per day, no other drink is as healthy. Fruit juice is best enjoyed in a healthy way, by EATING the fruit.  Thanks for choosing [facility] we consider it a privelige to serve you.

## 2024-02-08 NOTE — Assessment & Plan Note (Signed)
 Lab Results  Component Value Date   HGBA1C 5.3 02/08/2024  Well-controlled on Ozempic  2 mg once weekly injection Continue current medication Patient counseled on low-carb diet We will recheck labs in 6 months

## 2024-02-08 NOTE — Progress Notes (Signed)
 Established Patient Office Visit  Subjective:  Patient ID: Sherry Hall, female    DOB: 1980/11/01  Age: 43 y.o. MRN: [id number]  CC:  Chief Complaint  Patient presents with   Diabetes    HPI Sherry Hall is a 43 y.o. female  has a past medical history of Anemia, Anxiety, Depression, Diabetes mellitus type 1 (HCC), GERD (gastroesophageal reflux disease), HIV (human immunodeficiency virus infection) (HCC) (2011), Hypertension, and Type 2 diabetes mellitus (HCC).  Patient presents for follow-up for her chronic medical conditions   Type 2 diabetes.  Currently on Jardiance  10 mg daily, Ozempic  2 mg once weekly.  She denies polyphagia polyuria polydipsia.  She has been following a low-carb diet but does not exercise.  Has had side effect of nausea with Ozempic , nausea is now better, still struggles with constipation   Chronic constipation has chronic constipation that has become worse since she started taking Ozempic .  She takes MiraLAX  as needed after not having a bowel movement in 3 to 4 days.  Also takes Dulcolax as needed.  Chronic fatigue.  Patient complains of chronic fatigue, states that she is not able to exercise as she would like to struggles to get work done at home due to her fatigue.  Cardiology plans on doing an echocardiogram later this year.  Stated that she was told that carvedilol  may be contributing to her low energy and therefore she was prescribed a lower dose but her fatigue remains unchanged.  Neuropathy has chronic numbness burning tingling sensations in arms and legs, stated that she has been told that her diabetes could be contributing to this      Past Medical History:  Diagnosis Date   Anemia    H/O   Anxiety    Depression    Diabetes mellitus type 1 (HCC)    GERD (gastroesophageal reflux disease)    HIV (human immunodeficiency virus infection) (HCC) 2011   Hypertension    Type 2 diabetes mellitus [facility])     Past Surgical History:   Procedure Laterality Date   CESAREAN SECTION  2001, 2008, 2009, 2011   CHOLECYSTECTOMY     EXCISION OF BREAST LESION N/A 01/04/2018   Procedure: EXCISION OF CHEST WALL ABSCESS. MIDDLE CHEST;  Surgeon: Emmalene Hare, MD;  Location: [facility] ORS;  Service: General;  Laterality: N/A;  resection of chest wall cyst / abscess   FRACTURE SURGERY Right    HAND   TONSILLECTOMY     TUBAL LIGATION  2011    Family History  Problem Relation Age of Onset   Diabetes Father    Hypertension Father    Cancer Mother        cervical     Social History   Socioeconomic History   Marital status: Single    Spouse name: Not on file   Number of children: Not on file   Years of education: Not on file   Highest education level: Not on file  Occupational History   Not on file  Tobacco Use   Smoking status: Former    Current packs/day: 0.50    Average packs/day: 0.5 packs/day for 15.0 years (7.5 ttl pk-yrs)    Types: Cigarettes   Smokeless tobacco: Never   Tobacco comments:    trying to slow, cutting back  Vaping Use   Vaping status: Never Used  Substance and Sexual Activity   Alcohol use: Yes    Alcohol/week: 1.0 standard drink of alcohol    Types: 1 Standard drinks or equivalent  per week    Comment: socially   Drug use: No   Sexual activity: Yes    Partners: Male    Birth control/protection: Surgical    Comment: declined condoms  Other Topics Concern   Not on file  Social History Narrative   Lives with her fiance and her son   Social Drivers of Corporate investment banker Strain: Low Risk  (11/02/2023)   Overall Financial Resource Strain (CARDIA)    Difficulty of Paying Living Expenses: Not very hard  Food Insecurity: No Food Insecurity (11/02/2023)   Hunger Vital Sign    Worried About Running Out of Food in the Last Year: Never true    Ran Out of Food in the Last Year: Never true  Transportation Needs: No Transportation Needs (11/02/2023)   PRAPARE - Scientist, research (physical sciences) (Medical): No    Lack of Transportation (Non-Medical): No  Physical Activity: Insufficiently Active (01/07/2023)   Exercise Vital Sign    Days of Exercise per Week: 3 days    Minutes of Exercise per Session: 20 min  Stress: No Stress Concern Present (01/07/2023)   [employer] of Occupational Health - Occupational Stress Questionnaire    Feeling of Stress : Not at all  Social Connections: Socially Isolated (01/07/2023)   Social Connection and Isolation Panel [NHANES]    Frequency of Communication with Friends and Family: More than three times a week    Frequency of Social Gatherings with Friends and Family: More than three times a week    Attends Religious Services: Never    Database administrator or Organizations: No    Attends Banker Meetings: Never    Marital Status: Never married  Intimate Partner Violence: Not At Risk (01/07/2023)   Humiliation, Afraid, Rape, and Kick questionnaire    Fear of Current or Ex-Partner: No    Emotionally Abused: No    Physically Abused: No    Sexually Abused: No    Outpatient Medications Prior to Visit  Medication Sig Dispense Refill   ALPRAZolam  (XANAX ) 1 MG tablet Take 1 mg by mouth 2 (two) times daily as needed for anxiety (panic attack).     aspirin  EC 81 MG tablet Take 1 tablet (81 mg total) by mouth daily. Swallow whole.     atorvastatin  (LIPITOR) 40 MG tablet Take 1 tablet (40 mg total) by mouth daily. 90 tablet 3   bictegravir-emtricitabine -tenofovir  AF (BIKTARVY ) 50-200-25 MG TABS tablet Take 1 tablet by mouth daily. 30 tablet 11   Blood Pressure Monitoring (BLOOD PRESSURE CUFF) MISC 1 Units by Does not apply route daily. Please dispense on EXTRA LARGE (up to 52cm) blood pressure cuff kit 1 each 0   Blood Pressure Monitoring (OMRON 3 SERIES BP MONITOR) DEVI Use as directed. 1 each 0   buPROPion  (WELLBUTRIN  XL) 300 MG 24 hr tablet Take 300 mg by mouth daily.     busPIRone  (BUSPAR ) 10 MG tablet Take 10 mg by  mouth 3 (three) times daily.     carvedilol  (COREG ) 12.5 MG tablet Take 1 tablet (12.5 mg total) by mouth 2 (two) times daily. 60 tablet 3   empagliflozin  (JARDIANCE ) 10 MG TABS tablet Take 1 tablet (10 mg total) by mouth daily. 90 tablet 3   furosemide  (LASIX ) 20 MG tablet Take 1 tablet (20 mg total) by mouth daily. Can take an additional tab for weight gain of 2lbs in day or 5lbs in a week or for shortness of breath  with exertion 30 tablet 1   GLOBAL EASE INJECT PEN NEEDLES 32G X 4 MM MISC USE AS DIRECTED TO ADMINISTER INSULIN  THREE TIMES DAILY BEFORE MEALS 100 each 9   ondansetron  (ZOFRAN ) 4 MG tablet TAKE ONE TABLET BY MOUTH EVERY 6 HOURS AS NEEDED FOR NAUSEA OR VOMITING 30 tablet 0   sacubitril -valsartan  (ENTRESTO ) 97-103 MG Take 1 tablet by mouth 2 (two) times daily. 180 tablet 3   Semaglutide , 2 MG/DOSE, (OZEMPIC , 2 MG/DOSE,) 8 MG/3ML SOPN Inject 3 mLs as directed once a week. 3 mL 2   sertraline  (ZOLOFT ) 100 MG tablet Take by mouth daily.     traZODone  (DESYREL ) 50 MG tablet Take 50-100 mg by mouth at bedtime as needed for sleep.  1   Accu-Chek FastClix Lancets MISC 1 Units by Does not apply route 4 (four) times daily as needed. (Patient not taking: Reported on 11/09/2023) 102 each 12   Blood Glucose Monitoring Suppl (ACCU-CHEK GUIDE ME) w/Device KIT 1 Units by Does not apply route 4 (four) times daily as needed. (Patient not taking: Reported on 11/09/2023) 1 kit 0   glucose blood (ACCU-CHEK GUIDE) test strip Use 3 times a day. Dx code: E11.9 (Patient not taking: Reported on 12/31/2023) 100 each 12   albuterol  (VENTOLIN  HFA) 108 (90 Base) MCG/ACT inhaler Inhale 1-2 puffs into the lungs every 6 (six) hours as needed for wheezing or shortness of breath. (Patient not taking: Reported on 11/09/2023) 1 each 0   benzonatate  (TESSALON ) 100 MG capsule Take 1 capsule (100 mg total) by mouth every 8 (eight) hours. (Patient not taking: Reported on 11/09/2023) 21 capsule 0   fluconazole  (DIFLUCAN ) 150 MG  tablet TAKE ONE TABLET BY MOUTH DAILY (Patient not taking: Reported on 02/08/2024) 2 tablet 0   guaiFENesin -dextromethorphan (ROBITUSSIN DM) 100-10 MG/5ML syrup Take 5 mLs by mouth every 4 (four) hours as needed for cough. (Patient not taking: Reported on 01/17/2024) 118 mL 0   metroNIDAZOLE  (METROGEL ) 0.75 % gel Apply 1 Application topically 2 (two) times daily. (Patient not taking: Reported on 02/08/2024) 45 g 0   mupirocin  ointment (BACTROBAN ) 2 % Apply 1 Application topically 2 (two) times daily. (Patient not taking: Reported on 02/08/2024) 22 g 0   potassium chloride  SA (KLOR-CON  M20) 20 MEQ tablet Take 1 tablet (20 mEq total) by mouth daily for 3 days. 10 tablet 0   No facility-administered medications prior to visit.    Allergies  Allergen Reactions   Percocet [Oxycodone-Acetaminophen ] Hives, Itching and Rash   Spironolactone  Photosensitivity    Patient reports blurred vision and halos around objects   Latex Rash    ROS Review of Systems  Constitutional:  Positive for fatigue. Negative for appetite change, chills and fever.  HENT:  Negative for congestion, postnasal drip, rhinorrhea and sneezing.   Respiratory:  Negative for cough, shortness of breath and wheezing.   Cardiovascular:  Negative for chest pain, palpitations and leg swelling.  Gastrointestinal:  Negative for abdominal pain, constipation, nausea and vomiting.  Genitourinary:  Negative for difficulty urinating, dysuria, flank pain and frequency.  Musculoskeletal:  Negative for gait problem, joint swelling and myalgias.  Skin:  Negative for color change, pallor, rash and wound.  Neurological:  Positive for numbness. Negative for dizziness, facial asymmetry, weakness and headaches.  Psychiatric/Behavioral:  Negative for behavioral problems, confusion, self-injury and suicidal ideas.       Objective:    Physical Exam Vitals and nursing note reviewed.  Constitutional:      General: She is  not in acute distress.     Appearance: Normal appearance. She is obese. She is not ill-appearing, toxic-appearing or diaphoretic.  HENT:     Mouth/Throat:     Mouth: Mucous membranes are moist.     Pharynx: Oropharynx is clear. No oropharyngeal exudate or posterior oropharyngeal erythema.  Eyes:     General: No scleral icterus.       Right eye: No discharge.        Left eye: No discharge.     Extraocular Movements: Extraocular movements intact.     Conjunctiva/sclera: Conjunctivae normal.  Cardiovascular:     Rate and Rhythm: Normal rate and regular rhythm.     Pulses: Normal pulses.     Heart sounds: Normal heart sounds. No murmur heard.    No friction rub. No gallop.  Pulmonary:     Effort: Pulmonary effort is normal. No respiratory distress.     Breath sounds: Normal breath sounds. No stridor. No wheezing, rhonchi or rales.  Chest:     Chest wall: No tenderness.  Abdominal:     General: There is no distension.     Palpations: Abdomen is soft.     Tenderness: There is no abdominal tenderness. There is no right CVA tenderness, left CVA tenderness or guarding.  Musculoskeletal:        General: No swelling, tenderness, deformity or signs of injury.     Right lower leg: No edema.     Left lower leg: No edema.  Skin:    General: Skin is warm and dry.     Capillary Refill: Capillary refill takes less than 2 seconds.     Coloration: Skin is not jaundiced or pale.     Findings: No bruising, erythema or lesion.  Neurological:     Mental Status: She is alert and oriented to person, place, and time.     Motor: No weakness.     Gait: Gait normal.  Psychiatric:        Mood and Affect: Mood normal.        Behavior: Behavior normal.        Thought Content: Thought content normal.        Judgment: Judgment normal.     BP 108/69   Pulse 80   Temp (!) 97 F (36.1 C)   Wt 244 lb (110.7 kg)   SpO2 100%   BMI 47.65 kg/m  Wt Readings from Last 3 Encounters:  02/08/24 244 lb (110.7 kg)  01/17/24 245 lb  (111.1 kg)  12/31/23 243 lb (110.2 kg)    Lab Results  Component Value Date   TSH 1.42 07/08/2016   Lab Results  Component Value Date   WBC 6.9 11/08/2023   HGB 11.1 11/08/2023   HCT 32.7 (L) 11/08/2023   MCV 95 11/08/2023   PLT 233 11/08/2023   Lab Results  Component Value Date   NA 140 12/31/2023   K 4.6 12/31/2023   CO2 21 12/31/2023   GLUCOSE 84 12/31/2023   BUN 24 12/31/2023   CREATININE 1.58 (H) 12/31/2023   BILITOT 0.4 08/19/2023   ALKPHOS 66 08/19/2023   AST 9 08/19/2023   ALT 9 08/19/2023   PROT 7.4 08/19/2023   ALBUMIN 4.1 08/19/2023   CALCIUM  9.0 12/31/2023   ANIONGAP 10 10/22/2023   EGFR 41 (L) 12/31/2023   Lab Results  Component Value Date   CHOL 183 12/10/2022   Lab Results  Component Value Date   HDL 48 12/10/2022  Lab Results  Component Value Date   LDLCALC 115 (H) 12/10/2022   Lab Results  Component Value Date   TRIG 111 12/10/2022   Lab Results  Component Value Date   CHOLHDL 3.8 12/10/2022   Lab Results  Component Value Date   HGBA1C 5.3 02/08/2024      Assessment & Plan:   Problem List Items Addressed This Visit       Cardiovascular and Mediastinum   HTN (hypertension) (Chronic)   Blood pressure is well-controlled Continue furosemide  20 mg daily, carvedilol  12.5 mg twice daily, Entresto  93-103 mg 1 tablet twice daily DASH diet and commitment to daily physical activity for a minimum of 30 minutes discussed and encouraged, as a part of hypertension management. The importance of attaining a healthy weight is also discussed.     02/08/2024   10:52 AM 01/17/2024   10:51 AM 12/31/2023   10:25 AM 12/06/2023   10:51 AM 11/09/2023   10:37 AM 11/08/2023   11:30 AM 11/08/2023   10:46 AM  BP/Weight  Systolic BP 108 103 102  109 142 150  Diastolic BP 69 72 78  54 82 96  Wt. (Lbs) 244 245 243 239 239  246.6  BMI 47.65 kg/m2 47.85 kg/m2 47.46 kg/m2 46.68 kg/m2 46.68 kg/m2  48.16 kg/m2           Relevant Orders   Basic  Metabolic Panel   Chronic systolic congestive heart failure (HCC)   Continue carvedilol  12.5 mg mg twice daily, Jardiance  10 mg daily, Entresto  97-103 mg 1 tablet twice daily, furosemide  20 mg daily Encouraged to maintain close follow-up with cardiology        Digestive   Chronic constipation   Encouraged to take MiraLAX  17 g daily Increase intake of fiber and maintain hydration Patient referred to GI She does not want to stop taking Ozempic  for now      Relevant Medications   polyethylene glycol powder (GLYCOLAX /MIRALAX ) 17 GM/SCOOP powder   Other Relevant Orders   Ambulatory referral to Gastroenterology     Endocrine   Type 2 diabetes mellitus with complication, with long-term current use of insulin  (HCC)   Lab Results  Component Value Date   HGBA1C 5.3 02/08/2024  Well-controlled on Ozempic  2 mg once weekly injection Continue current medication Patient counseled on low-carb diet We will recheck labs in 6 months      Relevant Orders   POCT glycosylated hemoglobin (Hb A1C) (Completed)   Lipid panel     Other   HIV disease (HCC) (Chronic)   Encouraged to maintain close follow-up with cardiology Continue Biktarvy  as ordered      Dyslipidemia, goal LDL below 70   Lab Results  Component Value Date   CHOL 183 12/10/2022   HDL 48 12/10/2022   LDLCALC 115 (H) 12/10/2022   LDLDIRECT 101 (H) 11/09/2023   TRIG 111 12/10/2022   CHOLHDL 3.8 12/10/2022  On atorvastatin  40 mg daily Checking lipid panel, not fasting so we will check a direct LDL as well Avoid fatty fried foods      Relevant Orders   Lipid panel   Hepatic Function Panel   Direct LDL   Numbness and tingling of both legs below knees   Chronic condition patient referred to neurology      Relevant Orders   Ambulatory referral to Neurology   Morbid obesity with BMI of 45.0-49.9, adult (HCC)   Wt Readings from Last 3 Encounters:  02/08/24 244 lb (110.7 kg)  01/17/24  245 lb (111.1 kg)  12/31/23 243 lb  (110.2 kg)   Body mass index is 47.65 kg/m.  Patient counseled on low-carb diet Encouraged engage in regular moderate exercises as tolerated Continue Ozempic  2 mg once weekly Maintain close follow-up with the health and wellness coach      Chronic fatigue   Could be due to her chronic heart failure Patient encouraged to engage in regular moderate exercises as tolerated Continue current medications as ordered and maintain close follow-up with cardiology      Bilateral hand numbness - Primary   Chronic condition ,patient referred to neurology      Relevant Orders   Ambulatory referral to Neurology    Meds ordered this encounter  Medications   polyethylene glycol powder (GLYCOLAX /MIRALAX ) 17 GM/SCOOP powder    Sig: Take 17 g by mouth daily as needed.    Dispense:  3350 g    Refill:  1    Follow-up: Return in about 6 months (around 08/09/2024).    Maizie Garno R Manal Kreutzer, FNP

## 2024-02-08 NOTE — Assessment & Plan Note (Signed)
 Could be due to her chronic heart failure Patient encouraged to engage in regular moderate exercises as tolerated Continue current medications as ordered and maintain close follow-up with cardiology

## 2024-02-08 NOTE — Assessment & Plan Note (Signed)
 Encouraged to maintain close follow-up with cardiology Continue Biktarvy  as ordered

## 2024-02-08 NOTE — Assessment & Plan Note (Signed)
 Chronic condition patient referred to neurology

## 2024-02-08 NOTE — Assessment & Plan Note (Addendum)
 Wt Readings from Last 3 Encounters:  02/08/24 244 lb (110.7 kg)  01/17/24 245 lb (111.1 kg)  12/31/23 243 lb (110.2 kg)   Body mass index is 47.65 kg/m.  Patient counseled on low-carb diet Encouraged engage in regular moderate exercises as tolerated Continue Ozempic  2 mg once weekly Maintain close follow-up with the health and wellness coach

## 2024-02-08 NOTE — Assessment & Plan Note (Signed)
 Continue carvedilol  12.5 mg mg twice daily, Jardiance  10 mg daily, Entresto  97-103 mg 1 tablet twice daily, furosemide  20 mg daily Encouraged to maintain close follow-up with cardiology

## 2024-02-08 NOTE — Assessment & Plan Note (Signed)
 Encouraged to take MiraLAX  17 g daily Increase intake of fiber and maintain hydration Patient referred to GI She does not want to stop taking Ozempic  for now

## 2024-02-08 NOTE — Assessment & Plan Note (Signed)
 Chronic condition ,patient referred to neurology

## 2024-02-08 NOTE — Assessment & Plan Note (Signed)
 Blood pressure is well-controlled Continue furosemide  20 mg daily, carvedilol  12.5 mg twice daily, Entresto  93-103 mg 1 tablet twice daily DASH diet and commitment to daily physical activity for a minimum of 30 minutes discussed and encouraged, as a part of hypertension management. The importance of attaining a healthy weight is also discussed.     02/08/2024   10:52 AM 01/17/2024   10:51 AM 12/31/2023   10:25 AM 12/06/2023   10:51 AM 11/09/2023   10:37 AM 11/08/2023   11:30 AM 11/08/2023   10:46 AM  BP/Weight  Systolic BP 108 103 102  109 142 150  Diastolic BP 69 72 78  54 82 96  Wt. (Lbs) 244 245 243 239 239  246.6  BMI 47.65 kg/m2 47.85 kg/m2 47.46 kg/m2 46.68 kg/m2 46.68 kg/m2  48.16 kg/m2

## 2024-02-08 NOTE — Assessment & Plan Note (Signed)
 Lab Results  Component Value Date   CHOL 183 12/10/2022   HDL 48 12/10/2022   LDLCALC 115 (H) 12/10/2022   LDLDIRECT 101 (H) 11/09/2023   TRIG 111 12/10/2022   CHOLHDL 3.8 12/10/2022  On atorvastatin  40 mg daily Checking lipid panel, not fasting so we will check a direct LDL as well Avoid fatty fried foods

## 2024-02-09 LAB — HEPATIC FUNCTION PANEL
ALT: 11 IU/L (ref 0–32)
AST: 11 IU/L (ref 0–40)
Albumin: 3.9 g/dL (ref 3.9–4.9)
Alkaline Phosphatase: 60 IU/L (ref 44–121)
Bilirubin Total: 0.3 mg/dL (ref 0.0–1.2)
Bilirubin, Direct: 0.11 mg/dL (ref 0.00–0.40)
Total Protein: 6.8 g/dL (ref 6.0–8.5)

## 2024-02-09 LAB — LIPID PANEL
Chol/HDL Ratio: 2.4 ratio (ref 0.0–4.4)
Cholesterol, Total: 151 mg/dL (ref 100–199)
HDL: 62 mg/dL (ref >39)
LDL Chol Calc (NIH): 73 mg/dL (ref 0–99)
Triglycerides: 85 mg/dL (ref 0–149)
VLDL Cholesterol Cal: 16 mg/dL (ref 5–40)

## 2024-02-09 LAB — BASIC METABOLIC PANEL WITH GFR
BUN/Creatinine Ratio: 17 (ref 9–23)
BUN: 25 mg/dL — ABNORMAL HIGH (ref 6–24)
CO2: 22 mmol/L (ref 20–29)
Calcium: 8.8 mg/dL (ref 8.7–10.2)
Chloride: 105 mmol/L (ref 96–106)
Creatinine, Ser: 1.46 mg/dL — ABNORMAL HIGH (ref 0.57–1.00)
Glucose: 96 mg/dL (ref 70–99)
Potassium: 4.7 mmol/L (ref 3.5–5.2)
Sodium: 139 mmol/L (ref 134–144)
eGFR: 45 mL/min/{1.73_m2} — ABNORMAL LOW (ref >59)

## 2024-02-09 LAB — LDL CHOLESTEROL, DIRECT: LDL Direct: 69 mg/dL (ref 0–99)

## 2024-02-22 ENCOUNTER — Encounter: Payer: Self-pay | Admitting: Neurology

## 2024-03-07 NOTE — Progress Notes (Signed)
 The 10-year ASCVD risk score (Arnett DK, et al., 2019) is: 1.3%   Values used to calculate the score:     Age: 43 years     Sex: Female     Is Non-Hispanic African American: Yes     Diabetic: Yes     Tobacco smoker: No     Systolic Blood Pressure: 108 mmHg     Is BP treated: Yes     HDL Cholesterol: 62 mg/dL     Total Cholesterol: 151 mg/dL  Currently prescribed atorvastatin  40 mg.  Ether Goebel, BSN, RN

## 2024-03-10 ENCOUNTER — Other Ambulatory Visit: Payer: Self-pay

## 2024-03-10 DIAGNOSIS — R112 Nausea with vomiting, unspecified: Secondary | ICD-10-CM

## 2024-03-10 MED ORDER — ONDANSETRON HCL 4 MG PO TABS: 4.0000 mg | ORAL_TABLET | Freq: Three times a day (TID) | ORAL | 0 refills | Status: DC | PRN

## 2024-03-10 NOTE — Telephone Encounter (Signed)
 Please advise [facility]

## 2024-03-30 ENCOUNTER — Other Ambulatory Visit: Payer: Self-pay | Admitting: Nurse Practitioner

## 2024-04-13 NOTE — Progress Notes (Signed)
 Cardiology Office Note    Patient Name: Sherry Hall Date of Encounter: 04/13/2024  Primary Care Provider:  Paseda, Folashade R, FNP Primary Cardiologist:  Darryle ONEIDA Decent, MD Primary Electrophysiologist: None   Past Medical History    Past Medical History:  Diagnosis Date   Anemia    H/O   Anxiety    Depression    Diabetes mellitus type 1 (HCC)    GERD (gastroesophageal reflux disease)    HIV (human immunodeficiency virus infection) (HCC) 2011   Hypertension    Type 2 diabetes mellitus (HCC)     History of Present Illness  Sherry Hall is a 43 y.o. female with a PMH of HFrEF, NICM DM type II, obesity, OSA, HLD, HIV, HTN who presents today for 75-month follow-up.   Sherry Hall was last seen on 12/31/2023 for post ED follow-up.  She reported persistent fatigue and was euvolemic on exam with carvedilol  12.5 mg twice daily started to maximize GDMT.  No shortness of breath and was continued on current dose of Lasix .  Sherry Hall presents today for 27-month follow-up.  Currently, she experiences no shortness of breath and states that her swelling is under control. Her medication regimen includes Lasix , Entresto  at the maximum dose, and Jardiance . Spironolactone  was previously discontinued due to blurred vision. She experiences occasional leg cramps, which she manages by increasing her water intake. She reports significant improvement in her shortness of breath, stating she can now walk without feeling like she is about to pass out. Her physical activity has increased, and she feels she has more energy. She consumes a case of water every two to three days with her family, indicating adequate hydration. She has been experiencing numbness in her extremities for about a year, described as intense and 'dead' feeling, affecting her arms and sometimes her legs. The numbness occurs regardless of her sleeping position and can happen during activities such as driving. She notes that  her neck and shoulders hurt when she experiences numbness, but there is no known history of neck disc issues. She has an upcoming appointment with a neurologist to further investigate these symptoms. She has not been screened for lupus or other autoimmune diseases. Her past medical history includes diabetes, with her hemoglobin A1c currently under control. There is no mention of any family history of autoimmune diseases, but she acknowledges a genetic predisposition could be possible. She has not had her thyroid function checked since 2017. Patient denies chest pain, palpitations, dyspnea, PND, orthopnea, nausea, vomiting, dizziness, syncope, edema, weight gain, or early satiety.  Discussed the use of AI scribe software for clinical note transcription with the patient, who gave verbal consent to proceed.  History of Present Illness   Review of Systems  Please see the history of present illness.    All other systems reviewed and are otherwise negative except as noted above.  Physical Exam    Wt Readings from Last 3 Encounters:  02/08/24 244 lb (110.7 kg)  01/17/24 245 lb (111.1 kg)  12/31/23 243 lb (110.2 kg)   CD:Uyzmz were no vitals filed for this visit.,There is no height or weight on file to calculate BMI. GEN: Well nourished, well developed in no acute distress Neck: No JVD; No carotid bruits Pulmonary: Clear to auscultation without rales, wheezing or rhonchi  Cardiovascular: Normal rate. Regular rhythm. Normal S1. Normal S2.   Murmurs: There is no murmur.  ABDOMEN: Soft, non-tender, non-distended EXTREMITIES:  No edema; No deformity   EKG/LABS/ Recent Cardiac Studies  ECG personally reviewed by me today -none completed today  Risk Assessment/Calculations:          Lab Results  Component Value Date   WBC 6.9 11/08/2023   HGB 11.1 11/08/2023   HCT 32.7 (L) 11/08/2023   MCV 95 11/08/2023   PLT 233 11/08/2023   Lab Results  Component Value Date   CREATININE 1.46 (H)  02/08/2024   BUN 25 (H) 02/08/2024   NA 139 02/08/2024   K 4.7 02/08/2024   CL 105 02/08/2024   CO2 22 02/08/2024   Lab Results  Component Value Date   CHOL 151 02/08/2024   HDL 62 02/08/2024   LDLCALC 73 02/08/2024   LDLDIRECT 69 02/08/2024   TRIG 85 02/08/2024   CHOLHDL 2.4 02/08/2024    Lab Results  Component Value Date   HGBA1C 5.3 02/08/2024   Assessment & Plan    Assessment & Plan  1.HFrEF: -2D echo updated 11/2023 with EF of 45-50% with global hypokinesis and grade 1 DD and no LVH or significant valve abnormalities present. -Patient is euvolemic on examination today. -Continue current GDMT with Jardiance  10 mg, carvedilol  12.5 mg twice daily, Entresto  97/103 mg twice daily and Lasix  20 mg daily Repeat echocardiogram needed to assess ejection fraction. Spironolactone  not started due to previous adverse reaction. - Order echocardiogram in August to assess heart function. - BMET and TSH today - Advise use of Pedialyte or low/no sugar Gatorade for electrolyte management. - Continue current heart failure medications.  2.  Essential hypertension: - Patient's blood pressure today was stable at 132/72 - Continue carvedilol  12.5 mg twice daily Entresto  97/103 mg twice daily  3.  Hyperlipidemia: - Patient's last LDL was 69 at goal - Continue Lipitor 40 mg daily  4.  Obesity: - Patient's BMI is 48.04 kg/m - Patient reports improvement with shortness of breath related to increased physical activity - Patient advised to follow a low-sodium heart healthy diet.  5. Diabetes mellitus type II: Diabetes well-controlled with current regimen. Hemoglobin A1c under control. Numbness in extremities not likely diabetic neuropathy due to intensity. - Continue current diabetes management regimen.  Disposition: Follow-up with Darryle ONEIDA Decent, MD or APP in 6 months    Signed, Wyn Raddle, Jackee Shove, NP 04/13/2024, 5:16 PM [facility]

## 2024-04-14 ENCOUNTER — Ambulatory Visit: Attending: Nurse Practitioner | Admitting: Nurse Practitioner

## 2024-04-14 VITALS — BP 132/72 | HR 94 | Ht 60.0 in | Wt 246.0 lb

## 2024-04-14 DIAGNOSIS — I10 Essential (primary) hypertension: Secondary | ICD-10-CM | POA: Diagnosis not present

## 2024-04-14 DIAGNOSIS — E118 Type 2 diabetes mellitus with unspecified complications: Secondary | ICD-10-CM | POA: Insufficient documentation

## 2024-04-14 DIAGNOSIS — E782 Mixed hyperlipidemia: Secondary | ICD-10-CM | POA: Diagnosis not present

## 2024-04-14 DIAGNOSIS — Z794 Long term (current) use of insulin: Secondary | ICD-10-CM | POA: Diagnosis present

## 2024-04-14 DIAGNOSIS — I5022 Chronic systolic (congestive) heart failure: Secondary | ICD-10-CM | POA: Diagnosis not present

## 2024-04-14 NOTE — Patient Instructions (Addendum)
 Medication Instructions:  Your physician recommends that you continue on your current medications as directed. Please refer to the Current Medication list given to you today. *If you need a refill on your cardiac medications before your next appointment, please call your pharmacy*  Lab Work: TODAY-BMET & TSH If you have labs (blood work) drawn today and your tests are completely normal, you will receive your results only by: MyChart Message (if you have MyChart) OR A paper copy in the mail If you have any lab test that is abnormal or we need to change your treatment, we will call you to review the results.  Testing/Procedures: Your physician has requested that you have an echocardiogram. Echocardiography is a painless test that uses sound waves to create images of your heart. It provides your doctor with information about the size and shape of your heart and how well your heart's chambers and valves are working. This procedure takes approximately one hour. There are no restrictions for this procedure. Please do NOT wear cologne, perfume, aftershave, or lotions (deodorant is allowed). Please arrive 15 minutes prior to your appointment time.  Please note: We ask at that you not bring children with you during ultrasound (echo/ vascular) testing. Due to room size and safety concerns, children are not allowed in the ultrasound rooms during exams. Our front office staff cannot provide observation of children in our lobby area while testing is being conducted. An adult accompanying a patient to their appointment will only be allowed in the ultrasound room at the discretion of the ultrasound technician under special circumstances. We apologize for any inconvenience. SCHEDULE TEST IN 2 MONTHS (June 14, 2024)  Follow-Up: At [facility], you and your health needs are our priority.  As part of our continuing mission to provide you with exceptional heart care, our providers are all part of one  team.  This team includes your primary Cardiologist (physician) and Advanced Practice Providers or APPs (Physician Assistants and Nurse Practitioners) who all work together to provide you with the care you need, when you need it.  Your next appointment:   6 month(s)  Provider:   Darryle ONEIDA Decent, MD    We recommend signing up for the patient portal called MyChart.  Sign up information is provided on this After Visit Summary.  MyChart is used to connect with patients for Virtual Visits (Telemedicine).  Patients are able to view lab/test results, encounter notes, upcoming appointments, etc.  Non-urgent messages can be sent to your provider as well.   To learn more about what you can do with MyChart, go to [email or web address].   Other Instructions TRY PEDIALYTE OR NO SUGAR GATORADE TO HELP WITH CRAMPS

## 2024-04-15 LAB — BASIC METABOLIC PANEL WITH GFR
BUN/Creatinine Ratio: 20 (ref 9–23)
BUN: 26 mg/dL — ABNORMAL HIGH (ref 6–24)
CO2: 21 mmol/L (ref 20–29)
Calcium: 8.7 mg/dL (ref 8.7–10.2)
Chloride: 106 mmol/L (ref 96–106)
Creatinine, Ser: 1.3 mg/dL — ABNORMAL HIGH (ref 0.57–1.00)
Glucose: 98 mg/dL (ref 70–99)
Potassium: 4.5 mmol/L (ref 3.5–5.2)
Sodium: 142 mmol/L (ref 134–144)
eGFR: 52 mL/min/{1.73_m2} — ABNORMAL LOW (ref >59)

## 2024-04-15 LAB — TSH: TSH: 0.765 u[IU]/mL (ref 0.450–4.500)

## 2024-04-16 ENCOUNTER — Ambulatory Visit: Payer: Self-pay | Admitting: Nurse Practitioner

## 2024-04-16 DIAGNOSIS — Z79899 Other long term (current) drug therapy: Secondary | ICD-10-CM

## 2024-04-16 DIAGNOSIS — I5022 Chronic systolic (congestive) heart failure: Secondary | ICD-10-CM

## 2024-04-18 ENCOUNTER — Ambulatory Visit (INDEPENDENT_AMBULATORY_CARE_PROVIDER_SITE_OTHER): Admitting: Neurology

## 2024-04-18 ENCOUNTER — Encounter: Payer: Self-pay | Admitting: Neurology

## 2024-04-18 VITALS — BP 108/74 | HR 86 | Ht 60.0 in | Wt 249.0 lb

## 2024-04-18 DIAGNOSIS — R292 Abnormal reflex: Secondary | ICD-10-CM | POA: Diagnosis not present

## 2024-04-18 DIAGNOSIS — R202 Paresthesia of skin: Secondary | ICD-10-CM

## 2024-04-18 DIAGNOSIS — R200 Anesthesia of skin: Secondary | ICD-10-CM | POA: Diagnosis not present

## 2024-04-18 MED ORDER — FUROSEMIDE 20 MG PO TABS: 20.0000 mg | ORAL_TABLET | Freq: Every day | ORAL | Status: DC | PRN

## 2024-04-18 NOTE — Progress Notes (Signed)
 [name] Clinic Note - Initial Visit   Date: 04/18/2024   Sherry Hall MRN: [id number] DOB: May 27, 1981   Dear Sherry Paseda, FNP:  Thank you for your kind referral of Sherry Hall for consultation of bilateral tingling of the arms and legs. Although her history is well known to you, please allow us  to reiterate it for the purpose of our medical record. The patient was accompanied to the clinic by self.    [name] is a 43 y.o. right-handed female with well-controlled type 2 diabetes mellitus, GERD, depression/anxiety, HIV (2011, well-controlled), hypertension, hyperlipidemia, CHF, and anemia presenting for evaluation of generalized numbness/tingling.   IMPRESSION/PLAN: Bilateral arm and leg numbness/tingling, worse in the arms.  Symptoms do not fit a dermatomal or cutaneous nerve distribution and too widespread for peripheral neuropathy.  Further, there are no signs of neuropathy on her exam.  To evaluate for cord pathology, I will check MRI cervical spine wwo contrast. She has positive Tinel's sign at the wrists which may suggest bilateral CTS, but I explained that carpal tunnel syndrome only causes hand numbness/tingling, not the entire extremity.  NCS/EMG of the bilateral arms will be ordered. If MRI cervical spine is not revealing, NCS/EMG bilateral legs will be the next step.   Further recommendations pending results.   ------------------------------------------------------------- History of present illness: For at least the past year, she has been having numbness/tingling involving the entire arms and legs.  It occurs daily and lasts 1-5 minutes.  She has associated heaviness of the arms and legs.  Her arm numbness is worse at night time.  She has associated neck pain and shoulder pain.  She has right knee pain and had a fall due to this last month.  She walks unassisted.    Out-side paper records, electronic medical  record, and images have been reviewed where available and summarized as:  Lab Results  Component Value Date   HGBA1C 5.3 02/08/2024   Lab Results  Component Value Date   VITAMINB12 501 01/27/2021   Lab Results  Component Value Date   TSH 0.765 04/14/2024    Past Medical History:  Diagnosis Date   Anemia    H/O   Anxiety    Depression    Diabetes mellitus type 1 (HCC)    GERD (gastroesophageal reflux disease)    HIV (human immunodeficiency virus infection) (HCC) 2011   Hypertension    Type 2 diabetes mellitus (HCC)     Past Surgical History:  Procedure Laterality Date   CESAREAN SECTION  2001, 2008, 2009, 2011   CHOLECYSTECTOMY     EXCISION OF BREAST LESION N/A 01/04/2018   Procedure: EXCISION OF CHEST WALL ABSCESS. MIDDLE CHEST;  Surgeon: Desiderio Schanz, MD;  Location: [facility] ORS;  Service: General;  Laterality: N/A;  resection of chest wall cyst / abscess   FRACTURE SURGERY Right    HAND   TONSILLECTOMY     TUBAL LIGATION  2011     Medications:  Outpatient Encounter Medications as of 04/18/2024  Medication Sig Note   Accu-Chek FastClix Lancets MISC 1 Units by Does not apply route 4 (four) times daily as needed.    ALPRAZolam  (XANAX ) 1 MG tablet Take 1 mg by mouth 2 (two) times daily as needed for anxiety (panic attack). 12/11/2020: LF 12/10/2020 30 DS   aspirin  EC 81 MG tablet Take 1 tablet (81 mg total) by mouth daily. Swallow whole.    atorvastatin  (LIPITOR) 40 MG tablet Take 1 tablet (40 mg total) by  mouth daily.    bictegravir-emtricitabine -tenofovir  AF (BIKTARVY ) 50-200-25 MG TABS tablet Take 1 tablet by mouth daily.    Blood Glucose Monitoring Suppl (ACCU-CHEK GUIDE ME) w/Device KIT 1 Units by Does not apply route 4 (four) times daily as needed.    Blood Pressure Monitoring (BLOOD PRESSURE CUFF) MISC 1 Units by Does not apply route daily. Please dispense on EXTRA LARGE (up to 52cm) blood pressure cuff kit    Blood Pressure Monitoring (OMRON 3 SERIES BP MONITOR) DEVI  Use as directed.    buPROPion  (WELLBUTRIN  XL) 300 MG 24 hr tablet Take 300 mg by mouth daily. 12/11/2020: LF 12/10/2020 for 30 DS   busPIRone  (BUSPAR ) 10 MG tablet Take 10 mg by mouth 3 (three) times daily. 12/11/2020: LF 12/10/2020 30 DS   carvedilol  (COREG ) 12.5 MG tablet Take 1 tablet (12.5 mg total) by mouth 2 (two) times daily.    empagliflozin  (JARDIANCE ) 10 MG TABS tablet Take 1 tablet (10 mg total) by mouth daily.    furosemide  (LASIX ) 20 MG tablet Take 1 tablet (20 mg total) by mouth daily. Can take an additional tab for weight gain of 2lbs in day or 5lbs in a week or for shortness of breath with exertion    GLOBAL EASE INJECT PEN NEEDLES 32G X 4 MM MISC USE AS DIRECTED TO ADMINISTER INSULIN  THREE TIMES DAILY BEFORE MEALS    glucose blood (ACCU-CHEK GUIDE) test strip Use 3 times a day. Dx code: E11.9    ondansetron  (ZOFRAN ) 4 MG tablet Take 1 tablet (4 mg total) by mouth every 8 (eight) hours as needed for nausea or vomiting.    polyethylene glycol powder (GLYCOLAX /MIRALAX ) 17 GM/SCOOP powder Take 17 g by mouth daily as needed.    sacubitril -valsartan  (ENTRESTO ) 97-103 MG Take 1 tablet by mouth 2 (two) times daily.    Semaglutide , 2 MG/DOSE, (OZEMPIC , 2 MG/DOSE,) 8 MG/3ML SOPN INJECT 2mg  AS DIRECTED ONCE A WEEK    sertraline  (ZOLOFT ) 100 MG tablet Take by mouth daily. 12/11/2020: LF 12/10/2020 for 30 DS   traZODone  (DESYREL ) 50 MG tablet Take 50-100 mg by mouth at bedtime as needed for sleep. 11/03/2022: Prn    No facility-administered encounter medications on file as of 04/18/2024.    Allergies:  Allergies  Allergen Reactions   Percocet [Oxycodone-Acetaminophen ] Hives, Itching and Rash   Spironolactone  Photosensitivity    Patient reports blurred vision and halos around objects   Latex Rash    Family History: Family History  Problem Relation Age of Onset   Cancer Mother        cervical    Diabetes Father    Hypertension Father    Hypertension Maternal Grandmother    Diabetes  Maternal Grandmother    Seizures Maternal Grandmother     Social History: Social History   Tobacco Use   Smoking status: Every Day    Current packs/day: 0.50    Average packs/day: 0.5 packs/day for 15.0 years (7.5 ttl pk-yrs)    Types: Cigarettes   Smokeless tobacco: Never   Tobacco comments:    trying to slow, cutting back    04/18/2024- smokes less than half a pack a day  Vaping Use   Vaping status: Never Used  Substance Use Topics   Alcohol use: Yes    Alcohol/week: 1.0 standard drink of alcohol    Types: 1 Standard drinks or equivalent per week    Comment: socially   Drug use: No   Social History   Social History Narrative  Lives with her fiance and her son      Are you right handed or left handed? Right Handed    Are you currently employed ? No    What is your current occupation? Disabled    Do you live at home alone? No    Who lives with you? Three teenager boys   What type of home do you live in: 1 story or 2 story? One story home         Vital Signs:  BP 108/74   Pulse 86   Ht 5' (1.524 m)   Wt 249 lb (112.9 kg)   SpO2 100%   BMI 48.63 kg/m    Neurological Exam: MENTAL STATUS including orientation to time, place, person, recent and remote memory, attention span and concentration, language, and fund of knowledge is normal.  Speech is not dysarthric.  CRANIAL NERVES: II:  No visual field defects.     III-IV-VI: Pupils equal round and reactive to light.  Normal conjugate, extra-ocular eye movements in all directions of gaze.  No nystagmus.  No ptosis.   V:  Normal facial sensation.    VII:  Normal facial symmetry and movements.   VIII:  Normal hearing and vestibular function.   IX-X:  Normal palatal movement.   XI:  Normal shoulder shrug and head rotation.   XII:  Normal tongue strength and range of motion, no deviation or fasciculation.  MOTOR:  No atrophy, fasciculations or abnormal movements.  No pronator drift.   Upper Extremity:  Right  Left   Deltoid  5/5   5/5   Biceps  5/5   5/5   Triceps  5/5   5/5   Wrist extensors  5/5   5/5   Wrist flexors  5/5   5/5   Finger extensors  5/5   5/5   Finger flexors  5/5   5/5   Dorsal interossei  5/5   5/5   Abductor pollicis  5/5   5/5   Tone (Ashworth scale)  0  0   Lower Extremity:  Right  Left  Hip flexors  5/5   5/5   Knee flexors  5/5   5/5   Knee extensors  5/5   5/5   Dorsiflexors  5/5   5/5   Plantarflexors  5/5   5/5   Toe extensors  5/5   5/5   Toe flexors  5/5   5/5   Tone (Ashworth scale)  0  0   MSRs:                                           Right        Left brachioradialis 1+  1+  biceps 2+  2+  triceps 1+  1+  patellar 1+  1+  ankle jerk 1+  1+  plantar response down  down   SENSORY:  Normal and symmetric perception of light touch, pinprick, vibration, and temperature.  Romberg's sign absent.  Tinel's sign at the wrist is present bilaterally.   COORDINATION/GAIT: Normal finger-to- nose-finger.  Intact rapid alternating movements bilaterally. Gait is mildly based and stable.  Stressed gait intact.  She is unsteady with tandem gait.     Thank you for allowing me to participate in patient's care.  If I can answer any additional questions, I would be pleased to do  so.    Sincerely,    Coleton Woon K. Tobie, DO

## 2024-04-18 NOTE — Patient Instructions (Signed)
 MRI cervical spine  Nerve testing of bilateral arms and legs

## 2024-05-03 ENCOUNTER — Other Ambulatory Visit: Payer: Self-pay | Admitting: Nurse Practitioner

## 2024-05-12 ENCOUNTER — Telehealth: Payer: Self-pay

## 2024-05-12 NOTE — Telephone Encounter (Signed)
 Copied from CRM [id number]. Topic: Clinical - Medication Question >> May 12, 2024 12:46 PM Selinda RAMAN wrote: Reason for CRM: The patient called in stating she is having a MRI of the Cspine on Sunday and is having anxiety about it. She is asking if she can have something called in for her nerves. She uses  [facility] - [place], [place] - [address]  Phone: [phone] Fax: [phone]   Please assist patient further

## 2024-05-14 ENCOUNTER — Ambulatory Visit
Admission: RE | Admit: 2024-05-14 | Discharge: 2024-05-14 | Disposition: A | Discharge status: Home / Self Care | Source: Ambulatory Visit | Attending: Neurology

## 2024-05-14 DIAGNOSIS — R292 Abnormal reflex: Secondary | ICD-10-CM

## 2024-05-14 MED ORDER — GADOPICLENOL 0.5 MMOL/ML IV SOLN
10.0000 mL | Freq: Once | INTRAVENOUS | Status: AC | PRN
  Administered 2024-05-14: 10 mL via INTRAVENOUS

## 2024-05-17 ENCOUNTER — Ambulatory Visit: Payer: Self-pay | Admitting: Neurology

## 2024-05-24 ENCOUNTER — Telehealth: Payer: Self-pay | Admitting: Neurology
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
## 2024-05-25 NOTE — Telephone Encounter (Signed)
 She is scheduled for EMG next week.  If she would like to switch that appointment to a follow-up visit, we can do that.  Her EMG will get rescheduled to the he next available appointment which may be several weeks.  If she needs to be seen sooner, then she would need to go to urgent care.
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
## 2024-05-25 NOTE — Telephone Encounter (Signed)
 Called patient and informed her of Dr. Anthony recommendations per below. Patient will keep her EMG's as scheduled and think about what she would like to do. Patient aware that if she needs to be seen sooner that she would need to seek care at an urgent care. Patient verbalized understanding and had no further questions or concerns.

## 2024-05-31 ENCOUNTER — Ambulatory Visit (HOSPITAL_COMMUNITY)
Admission: RE | Admit: 2024-05-31 | Discharge: 2024-05-31 | Disposition: A | Discharge status: Home / Self Care | Source: Ambulatory Visit | Attending: Nurse Practitioner | Admitting: Nurse Practitioner

## 2024-05-31 DIAGNOSIS — I10 Essential (primary) hypertension: Secondary | ICD-10-CM | POA: Insufficient documentation

## 2024-05-31 DIAGNOSIS — I5022 Chronic systolic (congestive) heart failure: Secondary | ICD-10-CM

## 2024-05-31 DIAGNOSIS — E782 Mixed hyperlipidemia: Secondary | ICD-10-CM

## 2024-05-31 LAB — ECHOCARDIOGRAM COMPLETE
Area-P 1/2: 4.06 cm2
S' Lateral: 4.2 cm

## 2024-06-01 ENCOUNTER — Encounter: Admitting: Neurology

## 2024-06-02 ENCOUNTER — Encounter: Admitting: Neurology

## 2024-06-20 ENCOUNTER — Other Ambulatory Visit: Payer: Self-pay | Admitting: Nurse Practitioner

## 2024-06-20 DIAGNOSIS — R112 Nausea with vomiting, unspecified: Secondary | ICD-10-CM

## 2024-06-30 ENCOUNTER — Other Ambulatory Visit: Payer: Self-pay | Admitting: Infectious Diseases

## 2024-06-30 DIAGNOSIS — B20 Human immunodeficiency virus [HIV] disease: Secondary | ICD-10-CM

## 2024-07-03 ENCOUNTER — Other Ambulatory Visit: Payer: Self-pay | Admitting: Nurse Practitioner

## 2024-07-03 MED ORDER — OZEMPIC (2 MG/DOSE) 8 MG/3ML ~~LOC~~ SOPN: 2.0000 mg | PEN_INJECTOR | SUBCUTANEOUS | 1 refills | Status: DC

## 2024-07-03 NOTE — Telephone Encounter (Signed)
 Please advise [facility]

## 2024-07-10 ENCOUNTER — Ambulatory Visit: Admitting: Infectious Diseases

## 2024-07-14 ENCOUNTER — Ambulatory Visit (INDEPENDENT_AMBULATORY_CARE_PROVIDER_SITE_OTHER): Admitting: Neurology

## 2024-07-14 DIAGNOSIS — G5621 Lesion of ulnar nerve, right upper limb: Secondary | ICD-10-CM

## 2024-07-14 DIAGNOSIS — G5603 Carpal tunnel syndrome, bilateral upper limbs: Secondary | ICD-10-CM

## 2024-07-14 NOTE — Procedures (Signed)
 [facility]  [address] [place], [address]  [place], [place] [address] Tel: [phone] Fax: [phone] Test Date:  07/14/2024  Patient: Sherry Hall DOB: 12/07/1980 Physician: Tonita Blanch, DO  Sex: Female Height: 5' 0 Ref Phys: Tonita Blanch, DO  ID#: [id number]   Technician:    History: This is a 43 year old female referred for evaluation of bilateral hand paresthesias.  NCV & EMG Findings: Extensive electrodiagnostic testing of the right upper extremity and additional studies of the left shows:  Bilateral median sensory responses show prolonged latency (R4.9, L5.8 ms) and reduced amplitude (R14.3, L8.5 V).  Right ulnar sensory response shows reduced amplitude (9.7 V).  Left ulnar and bilateral radial sensory responses are within normal limits. Bilateral median motor responses show prolonged latency (R4.5, L4.9 ms).  Right ulnar motor response shows amplitude (5.0 mV) and decreased conduction velocity (A Elbow-B Elbow, 42 m/s).  Left ulnar motor response is within normal limits.   There is no evidence of active or chronic motor axonal loss changes affecting any of the tested muscles.    Impression: Bilateral median neuropathy at or distal to the wrist, consistent with a clinical diagnosis of carpal tunnel syndrome.  Overall, these findings are moderate in degree electrically. Right ulnar neuropathy with slowing across the elbow, demyelinating and axonal loss, moderate. There is no evidence of a cervical radiculopathy affecting the upper extremities.   ___________________________ Tonita Blanch, DO    Nerve Conduction Studies   Stim Site NR Peak (ms) Norm Peak (ms) O-P Amp (V) Norm O-P Amp  Left Median Anti Sensory (2nd Digit)  32 C  Wrist    *5.8 <3.4 *8.5 >20  Right Median Anti Sensory (2nd Digit)  32 C  Wrist    *4.9 <3.4 *14.3 >20  Left Radial Anti Sensory (Base 1st Digit)  32 C  Wrist    1.8 <2.7 21.4 >18  Right Radial Anti Sensory (Base 1st  Digit)  32 C  Wrist    1.8 <2.7 23.7 >18  Left Ulnar Anti Sensory (5th Digit)  32 C  Wrist    2.5 <3.1 14.0 >12  Right Ulnar Anti Sensory (5th Digit)  32 C  Wrist    2.7 <3.1 *9.7 >12     Stim Site NR Onset (ms) Norm Onset (ms) O-P Amp (mV) Norm O-P Amp Site1 Site2 Delta-0 (ms) Dist (cm) Vel (m/s) Norm Vel (m/s)  Left Median Motor (Abd Poll Brev)  32 C  Wrist    *4.9 <3.9 7.2 >6 Elbow Wrist 5.6 28.0 50 >50  Elbow    10.5  6.3         Right Median Motor (Abd Poll Brev)  32 C  Wrist    *4.5 <3.9 10.2 >6 Elbow Wrist 5.3 27.0 51 >50  Elbow    9.8  9.1         Left Ulnar Motor (Abd Dig Minimi)  32 C  Wrist    2.3 <3.1 7.4 >7 B Elbow Wrist 3.7 20.0 54 >50  B Elbow    6.0  6.3  A Elbow B Elbow 1.9 10.0 53 >50  A Elbow    7.9  6.1         Right Ulnar Motor (Abd Dig Minimi)  32 C  Wrist    2.6 <3.1 *5.0 >7 B Elbow Wrist 3.6 20.0 56 >50  B Elbow    6.2  4.3  A Elbow B Elbow 2.4 10.0 *42 >50  A Elbow  8.6  4.1          Electromyography   Side Muscle Ins.Act Fibs Fasc Recrt Amp Dur Poly Activation Comment  Right 1stDorInt Nml Nml Nml Nml Nml Nml Nml Nml N/A  Right Abd Poll Brev Nml Nml Nml Nml Nml Nml Nml Nml N/A  Right PronatorTeres Nml Nml Nml Nml Nml Nml Nml Nml N/A  Right Biceps Nml Nml Nml Nml Nml Nml Nml Nml N/A  Right Triceps Nml Nml Nml Nml Nml Nml Nml Nml N/A  Right Deltoid Nml Nml Nml Nml Nml Nml Nml Nml N/A  Right Abd Dig Min Nml Nml Nml Nml Nml Nml Nml Nml N/A  Right FlexCarpiUln Nml Nml Nml Nml Nml Nml Nml Nml N/A  Left 1stDorInt Nml Nml Nml Nml Nml Nml Nml Nml N/A  Left Abd Poll Brev Nml Nml Nml Nml Nml Nml Nml Nml N/A  Left PronatorTeres Nml Nml Nml Nml Nml Nml Nml Nml N/A  Left Biceps Nml Nml Nml Nml Nml Nml Nml Nml N/A  Left Triceps Nml Nml Nml Nml Nml Nml Nml Nml N/A  Left Deltoid Nml Nml Nml Nml Nml Nml Nml Nml N/A      Waveforms:

## 2024-07-20 ENCOUNTER — Encounter: Payer: Self-pay | Admitting: Neurology

## 2024-07-20 ENCOUNTER — Encounter: Admitting: Neurology

## 2024-08-01 ENCOUNTER — Ambulatory Visit: Admitting: Infectious Diseases

## 2024-08-09 ENCOUNTER — Ambulatory Visit (INDEPENDENT_AMBULATORY_CARE_PROVIDER_SITE_OTHER): Admitting: Nurse Practitioner

## 2024-08-09 ENCOUNTER — Encounter: Payer: Self-pay | Admitting: Nurse Practitioner

## 2024-08-09 VITALS — BP 108/82 | HR 90 | Wt 256.0 lb

## 2024-08-09 DIAGNOSIS — M25512 Pain in left shoulder: Secondary | ICD-10-CM

## 2024-08-09 DIAGNOSIS — F17200 Nicotine dependence, unspecified, uncomplicated: Secondary | ICD-10-CM

## 2024-08-09 DIAGNOSIS — F32A Depression, unspecified: Secondary | ICD-10-CM

## 2024-08-09 DIAGNOSIS — F419 Anxiety disorder, unspecified: Secondary | ICD-10-CM

## 2024-08-09 DIAGNOSIS — I5022 Chronic systolic (congestive) heart failure: Secondary | ICD-10-CM | POA: Diagnosis not present

## 2024-08-09 DIAGNOSIS — M25519 Pain in unspecified shoulder: Secondary | ICD-10-CM | POA: Insufficient documentation

## 2024-08-09 DIAGNOSIS — Z23 Encounter for immunization: Secondary | ICD-10-CM | POA: Diagnosis not present

## 2024-08-09 DIAGNOSIS — M25511 Pain in right shoulder: Secondary | ICD-10-CM

## 2024-08-09 DIAGNOSIS — N1831 Chronic kidney disease, stage 3a: Secondary | ICD-10-CM | POA: Insufficient documentation

## 2024-08-09 DIAGNOSIS — Z1231 Encounter for screening mammogram for malignant neoplasm of breast: Secondary | ICD-10-CM | POA: Diagnosis not present

## 2024-08-09 DIAGNOSIS — M25561 Pain in right knee: Secondary | ICD-10-CM

## 2024-08-09 DIAGNOSIS — Z6841 Body Mass Index (BMI) 40.0 and over, adult: Secondary | ICD-10-CM

## 2024-08-09 DIAGNOSIS — Z794 Long term (current) use of insulin: Secondary | ICD-10-CM | POA: Diagnosis not present

## 2024-08-09 DIAGNOSIS — E118 Type 2 diabetes mellitus with unspecified complications: Secondary | ICD-10-CM

## 2024-08-09 DIAGNOSIS — G8929 Other chronic pain: Secondary | ICD-10-CM | POA: Insufficient documentation

## 2024-08-09 DIAGNOSIS — R5382 Chronic fatigue, unspecified: Secondary | ICD-10-CM

## 2024-08-09 LAB — POCT GLYCOSYLATED HEMOGLOBIN (HGB A1C): Hemoglobin A1C: 6.2 % — AB (ref 4.0–5.6)

## 2024-08-09 MED ORDER — NICOTINE 7 MG/24HR TD PT24: 7.0000 mg | MEDICATED_PATCH | Freq: Every day | TRANSDERMAL | 0 refills | Status: DC

## 2024-08-09 MED ORDER — EMPAGLIFLOZIN 10 MG PO TABS
10.0000 mg | ORAL_TABLET | Freq: Every day | ORAL | 3 refills | Status: AC
Start: 2024-08-09 — End: ?

## 2024-08-09 MED ORDER — NICOTINE 14 MG/24HR TD PT24: 14.0000 mg | MEDICATED_PATCH | Freq: Every day | TRANSDERMAL | 0 refills | Status: DC

## 2024-08-09 NOTE — Assessment & Plan Note (Addendum)
 Depression and anxiety disorders (including panic disorder) Depression and anxiety managed with bupropion , 300 mg daily buspirone , 10 mg 3 times daily sertraline  100 mg daily, and alprazolam  1 mg twice daily as needed.  seeing a psychiatrist for management. - Continue current psychiatric medications as prescribed.

## 2024-08-09 NOTE — Assessment & Plan Note (Signed)
 Probably related to her chronic heart failure Will check vitamin D to screen for vitamin D deficiency

## 2024-08-09 NOTE — Assessment & Plan Note (Signed)
 Lab Results  Component Value Date   NA 142 04/14/2024   K 4.5 04/14/2024   CO2 21 04/14/2024   GLUCOSE 98 04/14/2024   BUN 26 (H) 04/14/2024   CREATININE 1.30 (H) 04/14/2024   CALCIUM  8.7 04/14/2024   EGFR 52 (L) 04/14/2024   GFRNONAA 56 (L) 10/22/2023   CKD stage 3 noted. Advised to avoid NSAIDs due to potential impact on kidney function. Tylenol  is a safer option for pain management. - Order BMP to check kidney function today. - Advise use of Tylenol  for pain management instead of NSAIDs. - Encourage hydration: at least 60 ounces of water daily.

## 2024-08-09 NOTE — Assessment & Plan Note (Signed)
 Chronic tobacco use disorder Chronic tobacco use disorder with significant reduction in smoking. Expressed desire to quit smoking completely. Discussed use of nicotine patches. - Order nicotine patches to assist with smoking cessation. - Encourage continued efforts to quit smoking.

## 2024-08-09 NOTE — Assessment & Plan Note (Addendum)
    08/09/2024   10:17 AM 02/08/2024   10:54 AM 01/17/2024   10:57 AM 11/09/2023   10:43 AM  GAD 7 : Generalized Anxiety Score  Nervous, Anxious, on Edge 1 3 3 1   Control/stop worrying 1 3 3 3   Worry too much - different things 1 3 3 3   Trouble relaxing 1 3 2 3   Restless 1 2 1  0  Easily annoyed or irritable 3 3 3 3   Afraid - awful might happen 1 3 3 3   Total GAD 7 Score 9 20 18 16   Anxiety Difficulty Extremely difficult Very difficult Very difficult Very difficult    Depression and anxiety disorders (including panic disorder) Depression and anxiety managed with bupropion  300 mg daily, buspirone  10 mg 3 times daily, sertraline  100 mg daily, and alprazolam  1 mg twice daily as needed reports seeing a psychiatrist for management. - Continue current psychiatric medications as prescribed.

## 2024-08-09 NOTE — Assessment & Plan Note (Signed)
 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 Previous MRI showed mild disc bulge without compression. - Follow-up with neurologist for EMG results and further evaluation.

## 2024-08-09 NOTE — Patient Instructions (Addendum)
 1. Type 2 diabetes mellitus with complication, with long-term current use of insulin  (HCC) (Primary)  - POCT glycosylated hemoglobin (Hb A1C) - Direct LDL - Basic Metabolic Panel  2. Chronic systolic congestive heart failure (HCC)   3. Morbid obesity with BMI of 45.0-49.9, adult (HCC)   4. Screening mammogram for breast cancer  - MM 3D SCREENING MAMMOGRAM BILATERAL BREAST; Future Please call [phone]   to schedule your mammogram.  [facility]. [address] [place], [place] [address]. [place] .    5. Tobacco use disorder, mild, abuse   - nicotine (NICODERM CQ - DOSED IN MG/24 HOURS) 14 mg/24hr patch; Place 1 patch (14 mg total) onto the skin daily.  Dispense: 42 patch; Refill: 0, take for 6 weeks then nicotine (NICODERM CQ - DOSED IN MG/24 HR) 7 mg/24hr patch; Place 1 patch (7 mg total) onto the skin daily.  Dispense: 14 patch; Refill: 0  6. Chronic pain of right knee  - DG Knee Complete 4 Views Right; Future     For knee pain ok to take tylenol  650mg  every 6 hours as needed , avoid use of aleeve , ibuprofen   Please get your x-ray done at [facility] Address: [address] [place], [place], [place] [address] Phone: [phone]     It is important that you exercise regularly at least 30 minutes 5 times a week as tolerated  Think about what you will eat, plan ahead. Choose  clean, green, fresh or frozen over canned, processed or packaged foods which are more sugary, salty and fatty. 70 to 75% of food eaten should be vegetables and fruit. Three meals at set times with snacks allowed between meals, but they must be fruit or vegetables. Aim to eat over a 12 hour period , example 7 am to 7 pm, and STOP after  your last meal of the day. Drink water,generally about 64 ounces per day, no other drink is as healthy. Fruit juice is best enjoyed in a healthy way, by EATING the fruit.  Thanks for choosing [facility] we consider it a  privelige to serve you.

## 2024-08-09 NOTE — Assessment & Plan Note (Addendum)
 Wt Readings from Last 3 Encounters:  08/09/24 256 lb (116.1 kg)  04/18/24 249 lb (112.9 kg)  04/14/24 246 lb (111.6 kg)   Body mass index is 50 kg/m.   Morbid obesity with challenges in weight management. Discussed importance of diet and exercise. Encouraged to focus on dietary changes and exercise despite challenges. - Encourage dietary modifications: 70-80% of diet should be vegetables and protein, less carbohydrates, water instead of juice or soda. - Encourage exercise: moderate to vigorous exercise, 30 minutes, 5 days a week as tolerated.

## 2024-08-09 NOTE — Assessment & Plan Note (Addendum)
 Previous x-ray in 2019 was normal. Discussed potential for joint injection if pain persists despite Tylenol . - Order x-ray of right knee to assess current condition. - Advise use of Tylenol  650 mg every 6 hours as needed for pain. - Consider referral to orthopedics for joint injection if pain persists despite Tylenol .

## 2024-08-09 NOTE — Assessment & Plan Note (Signed)
  Chronic systolic heart failure managed with carvedilol , Entresto , and furosemide  as needed, jardiance  No current symptoms of fluid retention or shortness of breath. Discussed importance of monitoring weight and fluid retention. - Continue carvedilol  12.5 mg oral bid.[email or web address]  10mg  daily  - Continue Entresto  97-103 mg oral bid. - Use furosemide  20 mg oral daily as needed if weight increases 2 pounds in 24 hours or 5 pounds in one week. - Encourage heart-healthy diet: low salt, low fat.

## 2024-08-09 NOTE — Progress Notes (Signed)
 Established Patient Office Visit  Subjective:  Patient ID: Sherry Hall, female    DOB: 02-05-1981  Age: 43 y.o. MRN: [id number]  CC:  Chief Complaint  Patient presents with   Diabetes    HPI     Discussed the use of AI scribe software for clinical note transcription with the patient, who gave verbal consent to proceed.  History of Present Illness Sherry Hall is a 43 year old female with heart failure and diabetes who presents with concerns about recent flu-like symptoms and weight gain.  She experienced flu-like symptoms twice, first in early September and again in early October, with sore throat and body aches. No confirmatory testing for influenza was done. Each episode lasted about two weeks, and she is now recovering from the second occurrence. Residual congestion with yellowish-white mucus production is noted.  She is concerned about weight gain and feels she has plateaued in her weight loss efforts. She experiences nausea, bloating, and 'rotten burps' associated with her current medication, Ozempic , which she takes for diabetes management. Despite a lack of deliberate exercise, she remains active in daily life, caring for her two children with special needs, which impacts her ability to engage in structured exercise.  She has a history of heart failure and takes several medications including carvedilol , Jardiance , furosemide  as needed, and Entresto . She reports a recent emergency situation a couple of months ago and told her cardiologist about her symptoms at that time, but currently has no chest pain, palpitations, or shortness of breath. Occasional fluid retention in her abdomen is managed with Lasix .   She experiences fatigue and sometimes requires caffeine to manage her energy levels.  She reports joint pain and stiffness, particularly in her right knee and shoulders, with numbness and tingling in her arms, sometimes radiating to her shoulders. She has a  history of carpal tunnel syndrome and mild disc bulging at C5-C6. She has undergone an EMG and MRI and is awaiting further evaluation.  She smokes, currently reduced to one or two cigarettes a day from a previous pack a day habit, and wants to quit smoking entirely.  She mentions a wound on her leg from a dog scratch that has been healing slowly over the past three weeks, but there is no drainage or significant pain.   Assessment & Plan     Past Medical History:  Diagnosis Date   Anemia    H/O   Anxiety    Depression    Diabetes mellitus type 1 (HCC)    GERD (gastroesophageal reflux disease)    HIV (human immunodeficiency virus infection) (HCC) 2011   Hypertension    Type 2 diabetes mellitus [facility])     Past Surgical History:  Procedure Laterality Date   CESAREAN SECTION  2001, 2008, 2009, 2011   CHOLECYSTECTOMY     EXCISION OF BREAST LESION N/A 01/04/2018   Procedure: EXCISION OF CHEST WALL ABSCESS. MIDDLE CHEST;  Surgeon: Desiderio Schanz, MD;  Location: [facility] ORS;  Service: General;  Laterality: N/A;  resection of chest wall cyst / abscess   FRACTURE SURGERY Right    HAND   TONSILLECTOMY     TUBAL LIGATION  2011    Family History  Problem Relation Age of Onset   Cancer Mother        cervical    Diabetes Father    Hypertension Father    Hypertension Maternal Grandmother    Diabetes Maternal Grandmother    Seizures Maternal Grandmother     Social  History   Socioeconomic History   Marital status: Single    Spouse name: Not on file   Number of children: Not on file   Years of education: Not on file   Highest education level: Not on file  Occupational History   Not on file  Tobacco Use   Smoking status: Every Day    Current packs/day: 0.50    Average packs/day: 0.5 packs/day for 15.0 years (7.5 ttl pk-yrs)    Types: Cigarettes   Smokeless tobacco: Never   Tobacco comments:    trying to slow, cutting back    04/18/2024- smokes less than half a pack a day  Vaping  Use   Vaping status: Never Used  Substance and Sexual Activity   Alcohol use: Yes    Alcohol/week: 1.0 standard drink of alcohol    Types: 1 Standard drinks or equivalent per week    Comment: socially   Drug use: No   Sexual activity: Yes    Partners: Male    Birth control/protection: Surgical    Comment: declined condoms  Other Topics Concern   Not on file  Social History Narrative   Lives with her fiance and her son      Are you right handed or left handed? Right Handed    Are you currently employed ? No    What is your current occupation? Disabled    Do you live at home alone? No    Who lives with you? Three teenager boys   What type of home do you live in: 1 story or 2 story? One story home        Social Drivers of Health   Financial Resource Strain: Low Risk  (11/02/2023)   Overall Financial Resource Strain (CARDIA)    Difficulty of Paying Living Expenses: Not very hard  Food Insecurity: No Food Insecurity (11/02/2023)   Hunger Vital Sign    Worried About Running Out of Food in the Last Year: Never true    Ran Out of Food in the Last Year: Never true  Transportation Needs: No Transportation Needs (11/02/2023)   PRAPARE - Administrator, Civil Service (Medical): No    Lack of Transportation (Non-Medical): No  Physical Activity: Insufficiently Active (01/07/2023)   Exercise Vital Sign    Days of Exercise per Week: 3 days    Minutes of Exercise per Session: 20 min  Stress: No Stress Concern Present (01/07/2023)   [employer] of Occupational Health - Occupational Stress Questionnaire    Feeling of Stress : Not at all  Social Connections: Socially Isolated (01/07/2023)   Social Connection and Isolation Panel    Frequency of Communication with Friends and Family: More than three times a week    Frequency of Social Gatherings with Friends and Family: More than three times a week    Attends Religious Services: Never    Database administrator or Organizations:  No    Attends Banker Meetings: Never    Marital Status: Never married  Intimate Partner Violence: Not At Risk (01/07/2023)   Humiliation, Afraid, Rape, and Kick questionnaire    Fear of Current or Ex-Partner: No    Emotionally Abused: No    Physically Abused: No    Sexually Abused: No    Outpatient Medications Prior to Visit  Medication Sig Dispense Refill   ALPRAZolam  (XANAX ) 1 MG tablet Take 1 mg by mouth 2 (two) times daily as needed for anxiety (panic attack).  aspirin  EC 81 MG tablet Take 1 tablet (81 mg total) by mouth daily. Swallow whole.     atorvastatin  (LIPITOR) 40 MG tablet Take 1 tablet (40 mg total) by mouth daily. 90 tablet 3   BIKTARVY  50-200-25 MG TABS tablet TAKE 1 TABLET BY MOUTH DAILY. 30 tablet 0   Blood Pressure Monitoring (OMRON 3 SERIES BP MONITOR) DEVI Use as directed. 1 each 0   buPROPion  (WELLBUTRIN  XL) 300 MG 24 hr tablet Take 300 mg by mouth daily.     busPIRone  (BUSPAR ) 10 MG tablet Take 10 mg by mouth 3 (three) times daily.     carvedilol  (COREG ) 12.5 MG tablet TAKE ONE TABLET BY MOUTH TWICE A DAY 180 tablet 3   furosemide  (LASIX ) 20 MG tablet Take 1 tablet (20 mg total) by mouth daily as needed for fluid (weight gain of 2 lbs in a day or 5lbs in a week).     GLOBAL EASE INJECT PEN NEEDLES 32G X 4 MM MISC USE AS DIRECTED TO ADMINISTER INSULIN  THREE TIMES DAILY BEFORE MEALS 100 each 9   ondansetron  (ZOFRAN ) 4 MG tablet TAKE ONE TABLET BY MOUTH EVERY 8 HOURS AS NEEDED FOR NAUSEA OR VOMITING 30 tablet 0   polyethylene glycol powder (GLYCOLAX /MIRALAX ) 17 GM/SCOOP powder Take 17 g by mouth daily as needed. 3350 g 1   sacubitril -valsartan  (ENTRESTO ) 97-103 MG Take 1 tablet by mouth 2 (two) times daily. 180 tablet 3   Semaglutide , 2 MG/DOSE, (OZEMPIC , 2 MG/DOSE,) 8 MG/3ML SOPN Inject 2 mg into the skin once a week. 3 mL 1   sertraline  (ZOLOFT ) 100 MG tablet Take by mouth daily.     traZODone  (DESYREL ) 50 MG tablet Take 50-100 mg by mouth at bedtime  as needed for sleep.  1   empagliflozin  (JARDIANCE ) 10 MG TABS tablet Take 1 tablet (10 mg total) by mouth daily. 90 tablet 3   Accu-Chek FastClix Lancets MISC 1 Units by Does not apply route 4 (four) times daily as needed. (Patient not taking: Reported on 08/09/2024) 102 each 12   Blood Glucose Monitoring Suppl (ACCU-CHEK GUIDE ME) w/Device KIT 1 Units by Does not apply route 4 (four) times daily as needed. (Patient not taking: Reported on 08/09/2024) 1 kit 0   Blood Pressure Monitoring (BLOOD PRESSURE CUFF) MISC 1 Units by Does not apply route daily. Please dispense on EXTRA LARGE (up to 52cm) blood pressure cuff kit (Patient not taking: Reported on 08/09/2024) 1 each 0   glucose blood (ACCU-CHEK GUIDE) test strip Use 3 times a day. Dx code: E11.9 (Patient not taking: Reported on 08/09/2024) 100 each 12   No facility-administered medications prior to visit.    Allergies  Allergen Reactions   Percocet [Oxycodone-Acetaminophen ] Hives, Itching and Rash   Spironolactone  Photosensitivity    Patient reports blurred vision and halos around objects   Latex Rash    ROS Review of Systems  Constitutional:  Positive for fatigue. Negative for appetite change, chills and fever.  HENT:  Negative for congestion, postnasal drip, rhinorrhea and sneezing.   Respiratory:  Negative for cough, shortness of breath and wheezing.   Cardiovascular:  Negative for chest pain, palpitations and leg swelling.  Gastrointestinal:  Negative for abdominal pain, constipation, nausea and vomiting.  Genitourinary:  Negative for difficulty urinating, dysuria, flank pain and frequency.  Musculoskeletal:  Positive for arthralgias. Negative for back pain, joint swelling and myalgias.  Skin:  Negative for color change, pallor, rash and wound.  Neurological:  Negative for facial asymmetry,  weakness, numbness and headaches.  Psychiatric/Behavioral:  Negative for behavioral problems, confusion, self-injury and suicidal ideas.        Objective:    Physical Exam Vitals and nursing note reviewed.  Constitutional:      General: She is not in acute distress.    Appearance: Normal appearance. She is obese. She is not ill-appearing, toxic-appearing or diaphoretic.  Eyes:     General: No scleral icterus.       Right eye: No discharge.        Left eye: No discharge.     Extraocular Movements: Extraocular movements intact.     Conjunctiva/sclera: Conjunctivae normal.  Cardiovascular:     Rate and Rhythm: Normal rate and regular rhythm.     Pulses: Normal pulses.     Heart sounds: Normal heart sounds. No murmur heard.    No friction rub. No gallop.  Pulmonary:     Effort: Pulmonary effort is normal. No respiratory distress.     Breath sounds: Normal breath sounds. No stridor. No wheezing, rhonchi or rales.  Chest:     Chest wall: No tenderness.  Abdominal:     General: There is no distension.     Palpations: Abdomen is soft.     Tenderness: There is no abdominal tenderness. There is no right CVA tenderness, left CVA tenderness or guarding.  Musculoskeletal:        General: No swelling, tenderness, deformity or signs of injury.     Right lower leg: No edema.     Left lower leg: No edema.  Skin:    General: Skin is warm and dry.     Capillary Refill: Capillary refill takes less than 2 seconds.     Coloration: Skin is not jaundiced or pale.     Findings: No bruising, erythema or lesion.  Neurological:     Mental Status: She is alert and oriented to person, place, and time.     Motor: No weakness.     Gait: Gait normal.  Psychiatric:        Mood and Affect: Mood normal.        Behavior: Behavior normal.        Thought Content: Thought content normal.        Judgment: Judgment normal.     BP 108/82   Pulse 90   Wt 256 lb (116.1 kg)   SpO2 100%   BMI 50.00 kg/m  Wt Readings from Last 3 Encounters:  08/09/24 256 lb (116.1 kg)  04/18/24 249 lb (112.9 kg)  04/14/24 246 lb (111.6 kg)    Lab  Results  Component Value Date   TSH 0.765 04/14/2024   Lab Results  Component Value Date   WBC 6.9 11/08/2023   HGB 11.1 11/08/2023   HCT 32.7 (L) 11/08/2023   MCV 95 11/08/2023   PLT 233 11/08/2023   Lab Results  Component Value Date   NA 142 04/14/2024   K 4.5 04/14/2024   CO2 21 04/14/2024   GLUCOSE 98 04/14/2024   BUN 26 (H) 04/14/2024   CREATININE 1.30 (H) 04/14/2024   BILITOT 0.3 02/08/2024   ALKPHOS 60 02/08/2024   AST 11 02/08/2024   ALT 11 02/08/2024   PROT 6.8 02/08/2024   ALBUMIN 3.9 02/08/2024   CALCIUM  8.7 04/14/2024   ANIONGAP 10 10/22/2023   EGFR 52 (L) 04/14/2024   Lab Results  Component Value Date   CHOL 151 02/08/2024   Lab Results  Component Value Date  HDL 62 02/08/2024   Lab Results  Component Value Date   LDLCALC 73 02/08/2024   Lab Results  Component Value Date   TRIG 85 02/08/2024   Lab Results  Component Value Date   CHOLHDL 2.4 02/08/2024   Lab Results  Component Value Date   HGBA1C 6.2 (A) 08/09/2024      Assessment & Plan:   Problem List Items Addressed This Visit       Cardiovascular and Mediastinum   Chronic systolic congestive heart failure (HCC)    Chronic systolic heart failure managed with carvedilol , Entresto , and furosemide  as needed, jardiance  No current symptoms of fluid retention or shortness of breath. Discussed importance of monitoring weight and fluid retention. - Continue carvedilol  12.5 mg oral bid.jardiance  10mg  daily  - Continue Entresto  97-103 mg oral bid. - Use furosemide  20 mg oral daily as needed if weight increases 2 pounds in 24 hours or 5 pounds in one week. - Encourage heart-healthy diet: low salt, low fat.       Relevant Medications   empagliflozin  (JARDIANCE ) 10 MG TABS tablet     Endocrine   Type 2 diabetes mellitus with complication, with long-term current use of insulin  (HCC) - Primary   Lab Results  Component Value Date   HGBA1C 6.2 (A) 08/09/2024   Type 2 diabetes managed  with Ozempic . A1c is 6.2, indicating good control. Discussed common gastrointestinal side effects of GLP-1 agonists. Advised against switching to Wegovy  due to its indication for non-diabetic patients with obesity. Medicaid covers Ozempic  and Trulicity, with Ozempic  being more effective for weight loss. - Continue Ozempic  2 mg subcutaneous once weekly.  Jardiance  10 mg daily - Encourage dietary modifications to minimize side effects: smaller meals, avoid fatty and fried foods. - Encourage exercise: moderate to vigorous exercise, 30 minutes, 5 days a week as tolerated.       Relevant Medications   empagliflozin  (JARDIANCE ) 10 MG TABS tablet   Other Relevant Orders   POCT glycosylated hemoglobin (Hb A1C) (Completed)   Direct LDL   Basic Metabolic Panel     Genitourinary   CKD stage 3a, GFR 45-59 ml/min (HCC)   Lab Results  Component Value Date   NA 142 04/14/2024   K 4.5 04/14/2024   CO2 21 04/14/2024   GLUCOSE 98 04/14/2024   BUN 26 (H) 04/14/2024   CREATININE 1.30 (H) 04/14/2024   CALCIUM  8.7 04/14/2024   EGFR 52 (L) 04/14/2024   GFRNONAA 56 (L) 10/22/2023   CKD stage 3 noted. Advised to avoid NSAIDs due to potential impact on kidney function. Tylenol  is a safer option for pain management. - Order BMP to check kidney function today. - Advise use of Tylenol  for pain management instead of NSAIDs. - Encourage hydration: at least 60 ounces of water daily.           Other   Anxiety disorder (Chronic)      08/09/2024   10:17 AM 02/08/2024   10:54 AM 01/17/2024   10:57 AM 11/09/2023   10:43 AM  GAD 7 : Generalized Anxiety Score  Nervous, Anxious, on Edge 1 3 3 1   Control/stop worrying 1 3 3 3   Worry too much - different things 1 3 3 3   Trouble relaxing 1 3 2 3   Restless 1 2 1  0  Easily annoyed or irritable 3 3 3 3   Afraid - awful might happen 1 3 3 3   Total GAD 7 Score 9 20 18 16   Anxiety Difficulty Extremely difficult  Very difficult Very difficult Very difficult     Depression and anxiety disorders (including panic disorder) Depression and anxiety managed with bupropion  300 mg daily, buspirone  10 mg 3 times daily, sertraline  100 mg daily, and alprazolam  1 mg twice daily as needed reports seeing a psychiatrist for management. - Continue current psychiatric medications as prescribed.      Moderately severe depression   Depression and anxiety disorders (including panic disorder) Depression and anxiety managed with bupropion , 300 mg daily buspirone , 10 mg 3 times daily sertraline  100 mg daily, and alprazolam  1 mg twice daily as needed.  seeing a psychiatrist for management. - Continue current psychiatric medications as prescribed.      Tobacco use disorder, mild, abuse   Chronic tobacco use disorder Chronic tobacco use disorder with significant reduction in smoking. Expressed desire to quit smoking completely. Discussed use of nicotine patches. - Order nicotine patches to assist with smoking cessation. - Encourage continued efforts to quit smoking.      Relevant Medications   nicotine (NICODERM CQ - DOSED IN MG/24 HOURS) 14 mg/24hr patch   nicotine (NICODERM CQ - DOSED IN MG/24 HR) 7 mg/24hr patch (Start on 09/20/2024)   Morbid obesity with BMI of 45.0-49.9, adult (HCC)   Wt Readings from Last 3 Encounters:  08/09/24 256 lb (116.1 kg)  04/18/24 249 lb (112.9 kg)  04/14/24 246 lb (111.6 kg)   Body mass index is 50 kg/m.   Morbid obesity with challenges in weight management. Discussed importance of diet and exercise. Encouraged to focus on dietary changes and exercise despite challenges. - Encourage dietary modifications: 70-80% of diet should be vegetables and protein, less carbohydrates, water instead of juice or soda. - Encourage exercise: moderate to vigorous exercise, 30 minutes, 5 days a week as tolerated.       Relevant Medications   empagliflozin  (JARDIANCE ) 10 MG TABS tablet   Chronic pain of right knee    Previous x-ray in 2019 was  normal. Discussed potential for joint injection if pain persists despite Tylenol . - Order x-ray of right knee to assess current condition. - Advise use of Tylenol  650 mg every 6 hours as needed for pain. - Consider referral to orthopedics for joint injection if pain persists despite Tylenol .           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 Previous MRI showed mild disc bulge without compression. - Follow-up with neurologist for EMG results and further evaluation.      Other Visit Diagnoses       Screening mammogram for breast cancer       Relevant Orders   MM 3D SCREENING MAMMOGRAM BILATERAL BREAST       Meds ordered this encounter  Medications   nicotine (NICODERM CQ - DOSED IN MG/24 HOURS) 14 mg/24hr patch    Sig: Place 1 patch (14 mg total) onto the skin daily.    Dispense:  42 patch    Refill:  0   nicotine (NICODERM CQ - DOSED IN MG/24 HR) 7 mg/24hr patch    Sig: Place 1 patch (7 mg total) onto the skin daily.    Dispense:  14 patch    Refill:  0   empagliflozin  (JARDIANCE ) 10 MG TABS tablet    Sig: Take 1 tablet (  10 mg total) by mouth daily.    Dispense:  90 tablet    Refill:  3    Follow-up: Return in about 3 months (around 11/09/2024) for DM smoking cessation.    Jozelyn Kuwahara R Milanya Sunderland, FNP

## 2024-08-09 NOTE — Addendum Note (Signed)
 Addended by: VICTORY IHA on: 08/09/2024 05:02 PM   Modules accepted: Orders

## 2024-08-09 NOTE — Assessment & Plan Note (Addendum)
 Lab Results  Component Value Date   HGBA1C 6.2 (A) 08/09/2024   Type 2 diabetes managed with Ozempic . A1c is 6.2, indicating good control. Discussed common gastrointestinal side effects of GLP-1 agonists. Advised against switching to Wegovy  due to its indication for non-diabetic patients with obesity. Medicaid covers Ozempic  and Trulicity, with Ozempic  being more effective for weight loss. - Continue Ozempic  2 mg subcutaneous once weekly.  Jardiance  10 mg daily - Encourage dietary modifications to minimize side effects: smaller meals, avoid fatty and fried foods. - Encourage exercise: moderate to vigorous exercise, 30 minutes, 5 days a week as tolerated.

## 2024-08-10 LAB — BASIC METABOLIC PANEL WITH GFR
BUN/Creatinine Ratio: 20 (ref 9–23)
BUN: 25 mg/dL — ABNORMAL HIGH (ref 6–24)
CO2: 22 mmol/L (ref 20–29)
Calcium: 9.1 mg/dL (ref 8.7–10.2)
Chloride: 104 mmol/L (ref 96–106)
Creatinine, Ser: 1.26 mg/dL — ABNORMAL HIGH (ref 0.57–1.00)
Glucose: 103 mg/dL — ABNORMAL HIGH (ref 70–99)
Potassium: 4.2 mmol/L (ref 3.5–5.2)
Sodium: 140 mmol/L (ref 134–144)
eGFR: 54 mL/min/1.73 — ABNORMAL LOW (ref >59)

## 2024-08-10 LAB — LDL CHOLESTEROL, DIRECT: LDL Direct: 71 mg/dL (ref 0–99)

## 2024-08-11 ENCOUNTER — Ambulatory Visit: Payer: Self-pay | Admitting: Nurse Practitioner

## 2024-08-11 ENCOUNTER — Other Ambulatory Visit: Payer: Self-pay | Admitting: Nurse Practitioner

## 2024-08-11 DIAGNOSIS — E559 Vitamin D deficiency, unspecified: Secondary | ICD-10-CM

## 2024-08-11 DIAGNOSIS — E785 Hyperlipidemia, unspecified: Secondary | ICD-10-CM

## 2024-08-11 LAB — SPECIMEN STATUS REPORT

## 2024-08-11 LAB — VITAMIN D 25 HYDROXY (VIT D DEFICIENCY, FRACTURES): Vit D, 25-Hydroxy: 7 ng/mL — AB (ref 30.0–100.0)

## 2024-08-11 MED ORDER — ATORVASTATIN CALCIUM 40 MG PO TABS: 40.0000 mg | ORAL_TABLET | Freq: Every day | ORAL | 1 refills | Status: AC

## 2024-08-11 MED ORDER — VITAMIN D (ERGOCALCIFEROL) 1.25 MG (50000 UNIT) PO CAPS: 50000.0000 [IU] | ORAL_CAPSULE | ORAL | 0 refills | Status: AC

## 2024-08-25 ENCOUNTER — Other Ambulatory Visit: Payer: Self-pay | Admitting: Nurse Practitioner

## 2024-08-25 DIAGNOSIS — R112 Nausea with vomiting, unspecified: Secondary | ICD-10-CM

## 2024-08-25 NOTE — Telephone Encounter (Signed)
 Please advise [facility]

## 2024-08-28 ENCOUNTER — Other Ambulatory Visit: Payer: Self-pay

## 2024-08-28 ENCOUNTER — Other Ambulatory Visit (HOSPITAL_COMMUNITY)
Admission: RE | Admit: 2024-08-28 | Discharge: 2024-08-28 | Disposition: A | Discharge status: Home / Self Care | Source: Ambulatory Visit | Attending: Infectious Diseases | Admitting: Infectious Diseases

## 2024-08-28 ENCOUNTER — Encounter: Payer: Self-pay | Admitting: Infectious Diseases

## 2024-08-28 ENCOUNTER — Ambulatory Visit (INDEPENDENT_AMBULATORY_CARE_PROVIDER_SITE_OTHER): Admitting: Infectious Diseases

## 2024-08-28 VITALS — BP 138/82 | HR 82 | Resp 20 | Ht 60.0 in | Wt 254.0 lb

## 2024-08-28 DIAGNOSIS — R5383 Other fatigue: Secondary | ICD-10-CM | POA: Diagnosis not present

## 2024-08-28 DIAGNOSIS — E119 Type 2 diabetes mellitus without complications: Secondary | ICD-10-CM

## 2024-08-28 DIAGNOSIS — B20 Human immunodeficiency virus [HIV] disease: Secondary | ICD-10-CM | POA: Diagnosis present

## 2024-08-28 DIAGNOSIS — Z7985 Long-term (current) use of injectable non-insulin antidiabetic drugs: Secondary | ICD-10-CM | POA: Diagnosis not present

## 2024-08-28 DIAGNOSIS — K5903 Drug induced constipation: Secondary | ICD-10-CM | POA: Diagnosis not present

## 2024-08-28 DIAGNOSIS — Z113 Encounter for screening for infections with a predominantly sexual mode of transmission: Secondary | ICD-10-CM | POA: Insufficient documentation

## 2024-08-28 DIAGNOSIS — E669 Obesity, unspecified: Secondary | ICD-10-CM

## 2024-08-28 DIAGNOSIS — Z79899 Other long term (current) drug therapy: Secondary | ICD-10-CM | POA: Diagnosis not present

## 2024-08-28 MED ORDER — LINACLOTIDE 72 MCG PO CAPS: 72.0000 ug | ORAL_CAPSULE | Freq: Every day | ORAL | 0 refills | Status: AC

## 2024-08-28 NOTE — Patient Instructions (Addendum)
   Catheline Doing, NP  Address: [address] [place], [place] [address] Phone: [phone]   Linzess - one capsule 30 minutes before breakfast.   Glycerine suppositories - made to help draw fluid in to pass your stool easier.

## 2024-08-28 NOTE — Progress Notes (Unsigned)
 I saw the patient independently of the student NP and conducted my own interview / exam.  I agree with the documentation provided with the following additions:   Chief Complaint  Patient presents with   Medical Management of Chronic Issues   Fatigue    Vitamin D medication does not help.    Discussed the use of AI scribe software for clinical note transcription with the patient, who gave verbal consent to proceed.  History of Present Illness   Sherry Hall is a 43 year old female with HIV who presents for routine follow-up care.  She experiences significant fatigue, which she may partially attribute to contracting the flu twice, once in early September and again in early October, after her partner also contracted it. She describes feeling drained since then, requiring a lot more caffeine to get her through.   She has gastrointestinal issues, which she believes are related to her use of Ozempic . She reports severe constipation, sometimes requiring manual dis-impaction with glove/vaseline for bowel movements, describing it as 'like a brick.' She has been on Ozempic  for over two years, initially with good results, but now experiences side effects with worsening A1C and regain of weight (about 25#).  Various remedies for constipation, including Milk of Magnesia, have been tried, which took four days to work.  She has a history of heart issues and has been to a cardiologist, where she underwent an echocardiogram. She was in heart failure earlier in the year, which required management of fluid in her abdomen. She is currently on Coreg  and Lasix  for her heart condition.  She was told her vitamin D was low and is currently in her third week of supplementation. She consumes a large amount of caffeine to manage her energy levels, which she notes affects her heart when not on medication.      Physical Exam Constitutional:      Appearance: Normal appearance.  Cardiovascular:     Rate and  Rhythm: Normal rate and regular rhythm.  Pulmonary:     Effort: Pulmonary effort is normal.  Neurological:     Mental Status: She is alert and oriented to person, place, and time.  Psychiatric:        Mood and Affect: Mood normal.        Behavior: Behavior normal.        Thought Content: Thought content normal.        Judgment: Judgment normal.     HIV 1 RNA Quant  Date Value  01/17/2024 <20 DETECTED copies/mL (A)  07/15/2023 <20 Copies/mL (H)  05/12/2022 Not Detected Copies/mL   CD4 T Cell Abs (/uL)  Date Value  07/15/2023 631  05/12/2022 455  12/12/2020 570   Assessment and Plan    Human immunodeficiency virus (HIV) disease - Well controlled on once daily biktarvy  with long-term viral suppression and robust CD4 recovery. No changes to treatment. Update routine labs today.  Sexual health screenings requested.  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 Previous treatments like Miralax  and Milk of Magnesia have been ineffective. She is not on any other anti-diabetic medications outside of ozempic .  - Initiated Linzess at the lowest dose 72 mcg, 30 minutes before breakfast. Discussed side effects.  - Take glycerin suppositories if no bowel movement in three days or uncomfortable.  -  Monitor for side effects such as abdominal cramping and diarrhea.  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 Current treatment with Ozempic  is ineffective and causing difficult to manage gastrointestinal side effects. Discussion about switching to Mounjaro or consulting endocrinology for diabetes management.  - Consider switching to Mounjaro if provider agrees - if anything to help with severe  constipation she has from Ozempic .  - Discuss potential referral to endocrinology if needed.  Fatigue -  Persistent fatigue possibly related to recent viral infections and vitamin D deficiency. Vitamin D supplementation ongoing for three weeks. Fatigue may also be related to anemia or B12 deficiency, though blood counts do not suggest anemia. - Continue vitamin D supplementation. - Ordered B12 level to rule out deficiency. Folate and Iron as well - though her CBC do not indicate that these will be abnormal.   Visit duration: 18 minutes      Meds ordered this encounter  Medications   linaclotide (LINZESS) 72 MCG capsule    Sig: Take 1 capsule (72 mcg total) by mouth daily before breakfast.    Dispense:  30 capsule    Refill:  0   Orders Placed This Encounter  Procedures   RPR   CBC w/Diff   Iron, TIBC and Ferritin Panel   HIV 1 RNA quant-no reflex-bld   T-helper cells (CD4) count   B12 and Folate Panel   Return in about 6 months (around 02/25/2025).   Corean Fireman, MSN, NP-C [facility]  [email or web address] Pager: [phone] Office: [phone] [facility] Main Line: [phone] *Secure Chat Communication Welcome

## 2024-08-28 NOTE — Progress Notes (Signed)
 Patient: Sherry Hall  DOB: May 12, 1981 MRN: [id number] PCP: Paseda, Folashade R, FNP  Referring Provider:   Reason for Visit: 6 month follow up   Chief Complaint  Patient presents with   Medical Management of Chronic Issues   Fatigue    Vitamin D medication does not help.      Subjective   Subjective:   Sherry Hall is a 43 year old female being seen in clinic for routine HIV care. Last seen on 01/07/2024, she reports increasing fatigue. She states that fatigue has gotten worse after contracting the flu once in September and than again in October, both episodes lasting for about 2 weeks. She states having to increase caffeine intake to get through her day. She c/o constipation and says its related to the Ozempic . She states sometimes she goes a week or two with no BM. She states she takes Milk of Magnesia and takes a few days for it to work and describes BM hard as a brick. She is also concerned that Ozempic  is not as effective as she is gaining weight and increasing A1c. Current ART regiment includes daily Biktarvy  and states adherence with no missed doses or adverse effects. She states that she has no barriers in accessing medication. She reports no recent hospitalization or ER visits. She reports stable mood and denies sadness, hopelessness, jitteriness, or nervousness. She reports no new sexual partners or being sexually active. She reports stable housing, food, and transportation. She reports no problems staying or falling asleep. Preventive care reviewed and states she needs to schedule her mammogram and pap. Immunizations reviewed and is up to date. She denies fever, chills, night sweats, chest pain, shortness of breath, and new nodules or skin rashes. She has not other concerns at this time.    Review of Systems  Constitutional: Negative.   Respiratory: Negative.    Cardiovascular: Negative.   Gastrointestinal: Negative.   Genitourinary: Negative.   Skin:  Negative.   Neurological: Negative.   Psychiatric/Behavioral: Negative.      Past Medical History:  Diagnosis Date   Anemia    H/O   Anxiety    Depression    Diabetes mellitus type 1 (HCC)    GERD (gastroesophageal reflux disease)    HIV (human immunodeficiency virus infection) (HCC) 2011   Hypertension    Type 2 diabetes mellitus (HCC)     Outpatient Medications Prior to Visit  Medication Sig Dispense Refill   ALPRAZolam  (XANAX ) 1 MG tablet Take 1 mg by mouth 2 (two) times daily as needed for anxiety (panic attack).     aspirin  EC 81 MG tablet Take 1 tablet (81 mg total) by mouth daily. Swallow whole.     atorvastatin  (LIPITOR) 40 MG tablet Take 1 tablet (40 mg total) by mouth daily. 90 tablet 1   BIKTARVY  50-200-25 MG TABS tablet TAKE 1 TABLET BY MOUTH DAILY. 30 tablet 0   Blood Pressure Monitoring (OMRON 3 SERIES BP MONITOR) DEVI Use as directed. 1 each 0   buPROPion  (WELLBUTRIN  XL) 300 MG 24 hr tablet Take 300 mg by mouth daily.     busPIRone  (BUSPAR ) 10 MG tablet Take 10 mg by mouth 3 (three) times daily.     carvedilol  (COREG ) 12.5 MG tablet TAKE ONE TABLET BY MOUTH TWICE A DAY 180 tablet 3   empagliflozin  (JARDIANCE ) 10 MG TABS tablet Take 1 tablet (10 mg total) by mouth daily. 90 tablet 3   furosemide  (LASIX ) 20 MG tablet Take 1 tablet (  20 mg total) by mouth daily as needed for fluid (weight gain of 2 lbs in a day or 5lbs in a week).     GLOBAL EASE INJECT PEN NEEDLES 32G X 4 MM MISC USE AS DIRECTED TO ADMINISTER INSULIN  THREE TIMES DAILY BEFORE MEALS 100 each 9   nicotine (NICODERM CQ - DOSED IN MG/24 HOURS) 14 mg/24hr patch Place 1 patch (14 mg total) onto the skin daily. 42 patch 0   [START ON 09/20/2024] nicotine (NICODERM CQ - DOSED IN MG/24 HR) 7 mg/24hr patch Place 1 patch (7 mg total) onto the skin daily. 14 patch 0   ondansetron  (ZOFRAN ) 4 MG tablet TAKE ONE TABLET BY MOUTH EVERY EIGHT HOURS AS NEEDED FOR NAUSEA OR VOMITING 30 tablet 0   polyethylene glycol powder  (GLYCOLAX /MIRALAX ) 17 GM/SCOOP powder Take 17 g by mouth daily as needed. 3350 g 1   sacubitril -valsartan  (ENTRESTO ) 97-103 MG Take 1 tablet by mouth 2 (two) times daily. 180 tablet 3   Semaglutide , 2 MG/DOSE, (OZEMPIC , 2 MG/DOSE,) 8 MG/3ML SOPN Inject 2 mg into the skin once a week. 3 mL 1   sertraline  (ZOLOFT ) 100 MG tablet Take by mouth daily.     traZODone  (DESYREL ) 50 MG tablet Take 50-100 mg by mouth at bedtime as needed for sleep.  1   Vitamin D, Ergocalciferol, (DRISDOL) 1.25 MG (50000 UNIT) CAPS capsule Take 1 capsule (50,000 Units total) by mouth every 7 (seven) days. 12 capsule 0   Accu-Chek FastClix Lancets MISC 1 Units by Does not apply route 4 (four) times daily as needed. (Patient not taking: Reported on 08/28/2024) 102 each 12   Blood Glucose Monitoring Suppl (ACCU-CHEK GUIDE ME) w/Device KIT 1 Units by Does not apply route 4 (four) times daily as needed. (Patient not taking: Reported on 08/28/2024) 1 kit 0   Blood Pressure Monitoring (BLOOD PRESSURE CUFF) MISC 1 Units by Does not apply route daily. Please dispense on EXTRA LARGE (up to 52cm) blood pressure cuff kit (Patient not taking: Reported on 08/28/2024) 1 each 0   glucose blood (ACCU-CHEK GUIDE) test strip Use 3 times a day. Dx code: E11.9 (Patient not taking: Reported on 08/28/2024) 100 each 12   No facility-administered medications prior to visit.     Allergies  Allergen Reactions   Percocet [Oxycodone-Acetaminophen ] Hives, Itching and Rash   Spironolactone  Photosensitivity    Patient reports blurred vision and halos around objects   Latex Rash    Social History   Tobacco Use   Smoking status: Every Day    Current packs/day: 0.50    Average packs/day: 0.5 packs/day for 15.0 years (7.5 ttl pk-yrs)    Types: Cigarettes   Smokeless tobacco: Never   Tobacco comments:    trying to slow, cutting back    04/18/2024- smokes less than half a pack a day  Vaping Use   Vaping status: Never Used  Substance Use Topics    Alcohol use: Yes    Alcohol/week: 1.0 standard drink of alcohol    Types: 1 Standard drinks or equivalent per week    Comment: socially   Drug use: No    Family History  Problem Relation Age of Onset   Cancer Mother        cervical    Diabetes Father    Hypertension Father    Hypertension Maternal Grandmother    Diabetes Maternal Grandmother    Seizures Maternal Grandmother        Objective   Objective:  Vitals:   08/28/24 1039  BP: 138/82  Pulse: 82  Resp: 20  SpO2: 100%  Weight: 254 lb (115.2 kg)  Height: 5' (1.524 m)   Body mass index is 49.61 kg/m.  Physical Exam Constitutional:      Appearance: Normal appearance.  Cardiovascular:     Rate and Rhythm: Normal rate and regular rhythm.  Pulmonary:     Effort: Pulmonary effort is normal.     Breath sounds: Normal breath sounds.  Musculoskeletal:        General: Normal range of motion.  Neurological:     Mental Status: She is alert and oriented to person, place, and time.  Psychiatric:        Mood and Affect: Mood normal.        Behavior: Behavior normal.        Thought Content: Thought content normal.        Judgment: Judgment normal.        Assessment & Plan:   Problem List Items Addressed This Visit       Other   HIV disease (HCC) (Chronic)   Relevant Orders   HIV 1 RNA quant-no reflex-bld   T-helper cells (CD4) count   Other Visit Diagnoses       Fatigue, unspecified type    -  Primary   Relevant Orders   CBC w/Diff   Iron, TIBC and Ferritin Panel   B12 and Folate Panel     Routine screening for STI (sexually transmitted infection)       Relevant Orders   RPR   Urine cytology ancillary only       Assessment and Plan  -HIV disease Current regimen includes daily Biktarvy . She states daily adherence with no missed doses or adverse effects. No barriers in obtaining medication. Last HIV-1 RNA <20 and CD4 565. Will obtain HIV-1 RNA and CD4 labs today. Continue with current regimen.    -Healthcare Maintenance  Preventive care reviewed and states needs to schedule pap and mammogram. Discussed her colorectal screening. Educated on Color guard and will consider for next visit. Immunizations reviewed and up to date. She is interested in STI screening. Will obtain RPR and Urine GC/NAAT.   -Drug-induced Constipation She has been suffering from constipation since being on Ozempic . She has tried Milk of Magnesia with intermittent relief after a few days. She was started on Miralax  with no improvement. She states she takes it 1-2 times a week. Educated her to take Miralax  everyday and to increase water intake. She was also educated that she can also try taking Miralax  twice a day versus once a day. She was counseled to speak with her PCP to change medication to tirzepatide to prevent constipation.   -Fatigue  She states she has been experiencing increasing fatigue since contracting the flu back in September and October. She states she has increased her caffeine intake to get through the day. Last Vitamin D level at 7.0. Continue taking Vitamin D supplementation. Will order CBC, Iron, TIBIC, and ferritin panel today.     Orders Placed This Encounter  Procedures   RPR   CBC w/Diff   Iron, TIBC and Ferritin Panel   HIV 1 RNA quant-no reflex-bld   T-helper cells (CD4) count   B12 and Folate Panel    Meds ordered this encounter  Medications   linaclotide (LINZESS) 72 MCG capsule    Sig: Take 1 capsule (72 mcg total) by mouth daily before breakfast.    Dispense:  30 capsule    Refill:  0    Return in about 6 months (around 02/25/2025).

## 2024-08-29 LAB — URINE CYTOLOGY ANCILLARY ONLY
Chlamydia: NEGATIVE
Comment: NEGATIVE
Comment: NORMAL
Neisseria Gonorrhea: NEGATIVE

## 2024-08-29 LAB — T-HELPER CELLS (CD4) COUNT (NOT AT ARMC)
CD4 % Helper T Cell: 30 % — ABNORMAL LOW (ref 33–65)
CD4 T Cell Abs: 733 /uL (ref 400–1790)

## 2024-08-30 LAB — IRON,TIBC AND FERRITIN PANEL
%SAT: 21 % (ref 16–45)
Ferritin: 16 ng/mL (ref 16–232)
Iron: 80 ug/dL (ref 40–190)
TIBC: 374 ug/dL (ref 250–450)

## 2024-08-30 LAB — CBC WITH DIFFERENTIAL/PLATELET
Absolute Lymphocytes: 2652 {cells}/uL (ref 850–3900)
Absolute Monocytes: 334 {cells}/uL (ref 200–950)
Basophils Absolute: 84 {cells}/uL (ref 0–200)
Basophils Relative: 1.1 %
Eosinophils Absolute: 365 {cells}/uL (ref 15–500)
Eosinophils Relative: 4.8 %
HCT: 36.9 % (ref 35.0–45.0)
Hemoglobin: 12.5 g/dL (ref 11.7–15.5)
MCH: 32.4 pg (ref 27.0–33.0)
MCHC: 33.9 g/dL (ref 32.0–36.0)
MCV: 95.6 fL (ref 80.0–100.0)
MPV: 10.9 fL (ref 7.5–12.5)
Monocytes Relative: 4.4 %
Neutro Abs: 4165 {cells}/uL (ref 1500–7800)
Neutrophils Relative %: 54.8 %
Platelets: 205 Thousand/uL (ref 140–400)
RBC: 3.86 Million/uL (ref 3.80–5.10)
RDW: 12.5 % (ref 11.0–15.0)
Total Lymphocyte: 34.9 %
WBC: 7.6 Thousand/uL (ref 3.8–10.8)

## 2024-08-30 LAB — HIV-1 RNA QUANT-NO REFLEX-BLD
HIV 1 RNA Quant: NOT DETECTED {copies}/mL
HIV-1 RNA Quant, Log: NOT DETECTED {Log_copies}/mL

## 2024-08-30 LAB — B12 AND FOLATE PANEL
Folate: 6 ng/mL
Vitamin B-12: 586 pg/mL (ref 200–1100)

## 2024-08-30 LAB — RPR: RPR Ser Ql: NONREACTIVE

## 2024-09-01 ENCOUNTER — Ambulatory Visit: Payer: Self-pay | Admitting: Infectious Diseases

## 2024-09-07 NOTE — Telephone Encounter (Signed)
 Spoke with [employer], notified her that [facility] sent her a [employer]. Offered to review with her. She confirms she can access MyChart and will read it. Asked her to call or respond to message with any questions.   Shaquayla Klimas, BSN, RN

## 2024-09-20 ENCOUNTER — Other Ambulatory Visit: Payer: Self-pay | Admitting: Nurse Practitioner

## 2024-09-21 ENCOUNTER — Other Ambulatory Visit: Payer: Self-pay | Admitting: Infectious Diseases

## 2024-09-21 DIAGNOSIS — B20 Human immunodeficiency virus [HIV] disease: Secondary | ICD-10-CM

## 2024-09-25 NOTE — Progress Notes (Signed)
 Cardiology Office Note:  .   Date:  10/04/2024  ID:  Sherry Hall, DOB 1981/06/01, MRN [id number] PCP: Paseda, Folashade R, FNP  [facility] Providers Cardiologist:  Darryle ONEIDA Decent, MD   History of Present Illness: .    Chief Complaint  Patient presents with   Follow-up    Sherry Hall is a 43 y.o. female with below history who presents for follow-up.   History of Present Illness   Sherry Hall is a 43 year old female with systolic heart failure who presents for follow-up.  She has a history of systolic heart failure with a recovered ejection fraction of 45-50%. No current chest pain or dyspnea. She experienced an episode in June requiring emergency department care but has since resumed her treatment regimen. Her current medications include carvedilol  12.5 mg twice a day, Entresto  97/103 mg twice a day, and Lasix  as needed. Aldactone  was discontinued due to dizziness and blurred vision.  Hypertension and diabetes are being managed. Her A1c has increased slightly, noted at her last primary care appointment, but she describes it as 'not too bad'. She is on Jardiance  10 mg daily and Lipitor 40 mg daily. She has been using Ozempic  for weight management but feels it is no longer effective, as she has gained 20 pounds recently.  She has been prescribed nicotine  patches to aid in smoking cessation, having cut back to 'one or two a day'. However, she reports increased cravings for sugar and has gained weight since starting the patches.  She is taking a baby aspirin  daily. She mentions difficulty with the timing of her evening medication dose, sometimes missing it if she falls asleep, but she is generally consistent with her medication regimen.   NM Stress test with inferolateral infarct, could be artifact.           Problem List 1. Systolic HF -35-40% 12/15/2020 -45-50% 05/06/2021 -45-50% 05/31/2024 -0 CAC on CT PE 12/11/2020 -dx in setting of uncontrolled  HTN 2. Diabetes -A1c 6.2 3. Obesity -BMI 52 4. HLD -T chol 151, HDL 62, LDL 71, TG 85 5. HTN 6. OSA -mild 7. HIV    ROS: All other ROS reviewed and negative. Pertinent positives noted in the HPI.     Studies Reviewed: SABRA   EKG Interpretation Date/Time:  Wednesday October 04 2024 10:56:58 EST Ventricular Rate:  80 PR Interval:  150 QRS Duration:  90 QT Interval:  388 QTC Calculation: 447 R Axis:   13  Text Interpretation: Normal sinus rhythm Possible Left atrial enlargement Confirmed by Decent Darryle [phone]) on 10/04/2024 11:29:33 AM   TTE 05/31/2024  1. Left ventricular ejection fraction, by estimation, is 45 to 50%. Left  ventricular ejection fraction by 3D volume is 44 %. The left ventricle has  mildly decreased function. The left ventricle demonstrates regional wall  motion abnormalities (see  scoring diagram/findings for description). Left ventricular diastolic  parameters are consistent with Grade I diastolic dysfunction (impaired  relaxation).   2. Right ventricular systolic function is normal. The right ventricular  size is normal.   3. The mitral valve is normal in structure. No evidence of mitral valve  regurgitation. No evidence of mitral stenosis.   4. The aortic valve is tricuspid. Aortic valve regurgitation is not  visualized. No aortic stenosis is present.   5. The inferior vena cava is normal in size with greater than 50%  respiratory variability, suggesting right atrial pressure of 3 mmHg.    Physical Exam:   VS:  BP 128/72 (BP Location: Left Arm, Patient Position: Sitting)   Pulse 90   Ht 5' (1.524 m)   Wt 261 lb (118.4 kg)   SpO2 99%   BMI 50.97 kg/m    Wt Readings from Last 3 Encounters:  10/04/24 261 lb (118.4 kg)  08/28/24 254 lb (115.2 kg)  08/09/24 256 lb (116.1 kg)    GEN: Well nourished, well developed in no acute distress NECK: No JVD; No carotid bruits CARDIAC: RRR, no murmurs, rubs, gallops RESPIRATORY:  Clear to auscultation  without rales, wheezing or rhonchi  ABDOMEN: Soft, non-tender, non-distended EXTREMITIES:  No edema; No deformity  ASSESSMENT AND PLAN: .   Assessment and Plan    Chronic systolic heart failure with recovered ejection fraction (EF 45-50%) Chronic systolic heart failure with recovered EF 45-50%. No chest pain or dyspnea. Euvolemic. Previous stress test showed inferolateral scarring. Differential includes ischemic versus non-ischemic etiology. MRI will help determine if there was an MI or non-ischemic cause. - Ordered cardiac MRI to assess for myocardial infarction or non-ischemic etiology. - Continue Entresto  97/103 mg BID. - Continue carvedilol  12.5 mg twice a day. - Continue jardiance  10 mg daily  - Continue Lasix  20 mg daily as needed.  Primary hypertension Blood pressure is well controlled on current regimen. - Continue current antihypertensive regimen.  Type 2 diabetes mellitus A1c is 6.2, indicating suboptimal control. Reports weight gain and decreased efficacy of Ozempic . Considering alternative medications like Zepbound, but current PCP is not supportive. - Continue Jardiance  10 mg daily.  Mixed hyperlipidemia LDL is 71, indicating good control on current regimen. - Continue Lipitor 40 mg daily.  Tobacco use disorder Reports significant reduction in smoking but continues to smoke 1-2 cigarettes per day. Using nicotine  patches but experiencing cravings for sugars. - Encouraged smoking cessation.                Follow-up: Return in about 4 months (around 02/02/2025).  Signed, Darryle DASEN. Barbaraann, MD, [facility]  [facility]  [address] [place], [place] [address] [phone]  11:29 AM

## 2024-10-04 ENCOUNTER — Ambulatory Visit: Attending: Cardiovascular Disease | Admitting: Cardiovascular Disease

## 2024-10-04 ENCOUNTER — Encounter: Payer: Self-pay | Admitting: Cardiovascular Disease

## 2024-10-04 VITALS — BP 128/72 | HR 90 | Ht 60.0 in | Wt 261.0 lb

## 2024-10-04 DIAGNOSIS — I10 Essential (primary) hypertension: Secondary | ICD-10-CM

## 2024-10-04 DIAGNOSIS — E782 Mixed hyperlipidemia: Secondary | ICD-10-CM | POA: Insufficient documentation

## 2024-10-04 DIAGNOSIS — I2510 Atherosclerotic heart disease of native coronary artery without angina pectoris: Secondary | ICD-10-CM | POA: Diagnosis present

## 2024-10-04 DIAGNOSIS — Z720 Tobacco use: Secondary | ICD-10-CM | POA: Diagnosis present

## 2024-10-04 DIAGNOSIS — I5022 Chronic systolic (congestive) heart failure: Secondary | ICD-10-CM | POA: Diagnosis present

## 2024-10-04 NOTE — Patient Instructions (Signed)
 Medication Instructions:  Your physician recommends that you continue on your current medications as directed. Please refer to the Current Medication list given to you today.  *If you need a refill on your cardiac medications before your next appointment, please call your pharmacy*   Testing/Procedures: See below  Follow-Up: At [facility], you and your health needs are our priority.  As part of our continuing mission to provide you with exceptional heart care, our providers are all part of one team.  This team includes your primary Cardiologist (physician) and Advanced Practice Providers or APPs (Physician Assistants and Nurse Practitioners) who all work together to provide you with the care you need, when you need it.  Your next appointment:   4 month(s)  Provider:   Darryle ONEIDA Decent, MD    We recommend signing up for the patient portal called MyChart.  Sign up information is provided on this After Visit Summary.  MyChart is used to connect with patients for Virtual Visits (Telemedicine).  Patients are able to view lab/test results, encounter notes, upcoming appointments, etc.  Non-urgent messages can be sent to your provider as well.   To learn more about what you can do with MyChart, go to [email or web address].   Other Instructions   You are scheduled for Cardiac MRI at the location below.  Please arrive for your appointment at ______________ . ?  [facility] [address] [place], [place] [address] Please take advantage of the free valet parking available at the [facility] and [employer] (Entrance C).  Proceed to the [facility] Radiology Department (First Floor) for check-in.   OR   [facility] [address] [place], [place] [address] Please go to the [facility] and check-in with the desk attendant.   Magnetic resonance imaging (MRI) is a painless test that produces images of the inside of the body without using  Xrays.  During an MRI, strong magnets and radio waves work together in a data processing manager to form detailed images.   MRI images may provide more details about a medical condition than X-rays, CT scans, and ultrasounds can provide.  You may be given earphones to listen for instructions.  You may eat a light breakfast and take medications as ordered with the exception of furosemide , hydrochlorothiazide , chlorthalidone or spironolactone  (or any other fluid pill). If you are undergoing a stress MRI, please avoid stimulants for 12 hr prior to test. (I.e. Caffeine, nicotine , chocolate, or antihistamine medications)  If your provider has ordered anti-anxiety medications for this test, then you will need a driver.  An IV will be inserted into one of your veins. Contrast material will be injected into your IV. It will leave your body through your urine within a day. You may be told to drink plenty of fluids to help flush the contrast material out of your system.  You will be asked to remove all metal, including: Watch, jewelry, and other metal objects including hearing aids, hair pieces and dentures. Also wearable glucose monitoring systems (ie. Freestyle Libre and Omnipods) (Braces and fillings normally are not a problem.)   TEST WILL TAKE APPROXIMATELY 1 HOUR  PLEASE NOTIFY SCHEDULING AT LEAST 24 HOURS IN ADVANCE IF YOU ARE UNABLE TO KEEP YOUR APPOINTMENT. [phone]  For more information and frequently asked questions, please visit our website : [email or web address]  Please call the Cardiac Imaging Nurse Navigators with any questions/concerns. [phone] Office

## 2024-10-24 ENCOUNTER — Encounter (HOSPITAL_COMMUNITY): Payer: Self-pay

## 2024-10-25 ENCOUNTER — Other Ambulatory Visit: Payer: Self-pay | Admitting: Cardiovascular Disease

## 2024-10-25 ENCOUNTER — Ambulatory Visit (HOSPITAL_COMMUNITY)
Admission: RE | Admit: 2024-10-25 | Discharge: 2024-10-25 | Disposition: A | Discharge status: Home / Self Care | Source: Ambulatory Visit | Attending: Cardiovascular Disease | Admitting: Cardiovascular Disease

## 2024-10-25 ENCOUNTER — Other Ambulatory Visit: Payer: Self-pay

## 2024-10-25 ENCOUNTER — Ambulatory Visit

## 2024-10-25 DIAGNOSIS — I5022 Chronic systolic (congestive) heart failure: Secondary | ICD-10-CM | POA: Insufficient documentation

## 2024-10-25 DIAGNOSIS — I10 Essential (primary) hypertension: Secondary | ICD-10-CM

## 2024-10-25 MED ORDER — GADOBUTROL 1 MMOL/ML IV SOLN
14.0000 mL | Freq: Once | INTRAVENOUS | Status: AC | PRN
  Administered 2024-10-25: 14 mL via INTRAVENOUS

## 2024-10-31 ENCOUNTER — Ambulatory Visit: Payer: Self-pay | Admitting: Cardiovascular Disease

## 2024-11-05 NOTE — Progress Notes (Unsigned)
 " Cardiology Office Note:  .   Date:  11/07/2024  ID:  Sherry Hall, DOB March 31, 1981, MRN [id number] PCP: Paseda, Folashade R, FNP  [facility] Providers Cardiologist:  Darryle ONEIDA Decent, MD   History of Present Illness: .    Chief Complaint  Patient presents with   Follow-up         Sherry Hall is a 44 y.o. female with below history who presents for follow-up.   History of Present Illness   Sherry Hall is a 44 year old female with systolic heart failure, ischemic cardiomyopathy, and coronary artery disease who presents for follow-up.  She experiences intermittent abdominal fullness, particularly when walking, and has been taking Lasix  20 mg sporadically, which has led to cramps in her calves. She suspects fluid retention and feels full quickly after eating. She has tried drinking water and Pedialyte to alleviate the cramps, but this has not been entirely effective.  She recalls two occasions where she was gasping for air, which might correlate with a silent heart attack. No chest pain is present, but she experiences shortness of breath when walking.  Her current medications include Entresto , Jardiance , Lipitor, aspirin , and carvedilol . She is allergic to spironolactone . She is also on Ozempic  for diabetes management and has been considering switching to Mounjaro due to weight gain concerns.  She has a history of smoking but reports being three days without cigarettes and is trying to quit completely. She is interested in starting a gym membership.           Problem List 1. Systolic HF -35-40% 12/15/2020 -45-50% 05/06/2021 -45-50% 05/31/2024 -40% 10/26/2024 CMR -2/2 CAD based on CMR 2. Diabetes -A1c 6.2 3. Obesity -BMI 52 4. HLD -T chol 151, HDL 62, LDL 71, TG 85 5. HTN 6. OSA -mild 7. HIV 8. CAD -LCX infarction on CMR 9. CKD 3a    ROS: All other ROS reviewed and negative. Pertinent positives noted in the HPI.     Studies Reviewed: SABRA    EKG Interpretation Date/Time:  Tuesday November 07 2024 13:53:53 EST Ventricular Rate:  71 PR Interval:  176 QRS Duration:  98 QT Interval:  402 QTC Calculation: 436 R Axis:   90  Text Interpretation: Normal sinus rhythm Rightward axis Confirmed by Decent Darryle [phone]) on 11/07/2024 1:58:22 PM   CMR 10/26/2024 IMPRESSION: 1. Borderline dilated LV with thin and akinetic inferolateral wall, basal inferior wall, mid anterolateral wall. LV EF 40%.   2.  Normal RV size and systolic function, RV EF 55%.   3. Delayed enhancement imaging showed two different patterns that are difficult to reconcile. There was >50% wall thickness subendocardial LGE in the basal to mid inferolateral wall and the basal inferior wall. This is a coronary disease pattern suggesting prior myocardial infarction likely in the left circumflex territory. This territory is unlikely to be viable with attempted revascularization. In addition, there is mid-wall LGE in the basal septum. This is more prominent than the usual subtle mid-wall basal septal LGE seen nonspecifically in dilated cardiomyopathies. This pattern is concerning for cardiac sarcoidosis or prior myocarditis.   4. Elevated extracellular volume percentage at 36% suggestive of increased myocardial fibrotic content. This is not in a range suggestive of cardiac amyloidosis Physical Exam:   VS:  BP 137/85 (BP Location: Right Arm, Patient Position: Sitting, Cuff Size: Large)   Pulse 72   Ht 5' (1.524 m)   Wt 275 lb 1.6 oz (124.8 kg)   SpO2 100%   BMI  53.73 kg/m    Wt Readings from Last 3 Encounters:  11/07/24 275 lb 1.6 oz (124.8 kg)  10/04/24 261 lb (118.4 kg)  08/28/24 254 lb (115.2 kg)    GEN: Well nourished, well developed in no acute distress NECK: No JVD; No carotid bruits CARDIAC: RRR, no murmurs, rubs, gallops RESPIRATORY:  Clear to auscultation without rales, wheezing or rhonchi  ABDOMEN: Soft, non-tender, non-distended EXTREMITIES:  No  edema; No deformity  ASSESSMENT AND PLAN: .   Assessment and Plan    Chronic systolic heart failure due to ischemic cardiomyopathy Mild chronic systolic heart failure with ejection fraction at 40%. Symptoms suggest fluid retention. Possible potassium imbalance from Lasix . - Lasix  20 mg daily. - Potassium 10 mEq daily with Lasix . - Ordered CBC, CMP, TSH, and lipid panel. - Scheduled left and right heart catheterization. - Continue Jardiance  10 mg daily, carvedilol  12.5 mg BID, and Entresto  97/103 mg.  Coronary artery disease with prior myocardial infarction Prior myocardial infarction with circumflex infarct. No current chest pain, shortness of breath present. Coronary anatomy assessment planned. - Scheduled left and right heart catheterization. - Continue aspirin  and statin therapy.  Mixed hyperlipidemia Recent LDL at 71 mg/dL on Lipitor 40 mg. - Rechecked lipid panel today. - Consider adding Zetia if lipid levels are not optimal.       Informed Consent   Shared Decision Making/Informed Consent The risks [stroke (1 in 1000), death (1 in 1000), kidney failure [usually temporary] (1 in 500), bleeding (1 in 200), allergic reaction [possibly serious] (1 in 200)], benefits (diagnostic support and management of coronary artery disease) and alternatives of a cardiac catheterization were discussed in detail with Ms. Deguia and she is willing to proceed.             Follow-up: Return in about 1 year (around 11/07/2025).  Signed, Darryle DASEN. Barbaraann, MD, [facility]  [facility]  [address] [place], [place] [address] [phone]  2:25 PM   "

## 2024-11-07 ENCOUNTER — Ambulatory Visit: Attending: Cardiovascular Disease | Admitting: Cardiovascular Disease

## 2024-11-07 ENCOUNTER — Telehealth: Payer: Self-pay | Admitting: Cardiovascular Disease

## 2024-11-07 ENCOUNTER — Encounter: Payer: Self-pay | Admitting: Cardiovascular Disease

## 2024-11-07 VITALS — BP 137/85 | HR 72 | Ht 60.0 in | Wt 275.1 lb

## 2024-11-07 DIAGNOSIS — I5022 Chronic systolic (congestive) heart failure: Secondary | ICD-10-CM | POA: Insufficient documentation

## 2024-11-07 DIAGNOSIS — E782 Mixed hyperlipidemia: Secondary | ICD-10-CM | POA: Insufficient documentation

## 2024-11-07 DIAGNOSIS — I2510 Atherosclerotic heart disease of native coronary artery without angina pectoris: Secondary | ICD-10-CM | POA: Insufficient documentation

## 2024-11-07 DIAGNOSIS — Z01812 Encounter for preprocedural laboratory examination: Secondary | ICD-10-CM | POA: Diagnosis not present

## 2024-11-07 DIAGNOSIS — I10 Essential (primary) hypertension: Secondary | ICD-10-CM | POA: Diagnosis not present

## 2024-11-07 LAB — LIPID PANEL

## 2024-11-07 MED ORDER — FUROSEMIDE 20 MG PO TABS: 20.0000 mg | ORAL_TABLET | Freq: Every day | ORAL | 3 refills | Status: AC

## 2024-11-07 MED ORDER — POTASSIUM CHLORIDE CRYS ER 10 MEQ PO TBCR: 10.0000 meq | EXTENDED_RELEASE_TABLET | Freq: Two times a day (BID) | ORAL | 3 refills | Status: AC

## 2024-11-07 NOTE — Patient Instructions (Addendum)
 Medication Instructions:  Furosemide  20 mg daily Potassium 10 meq daily *If you need a refill on your cardiac medications before your next appointment, please call your pharmacy*  Lab Work: CBC, CMP, TSH, Lipid panel If you have labs (blood work) drawn today and your tests are completely normal, you will receive your results only by: MyChart Message (if you have MyChart) OR A paper copy in the mail If you have any lab test that is abnormal or we need to change your treatment, we will call you to review the results.  Testing/Procedures:  [facility] A DEPT OF [facility] [address] A DEPT OF THE [place] [address] [place] [address] Dept: [phone] Loc: [phone]  Ceaira Stthomas  11/07/2024  You are scheduled for a Cardiac Catheterization on Tuesday, January 27 with Dr. Newman Lawrence.  1. Please arrive at the [facility] (Main Entrance A) at [facility]: [address] [place], [place] [address] at 8:30 AM (This time is 2 hour(s) before your procedure to ensure your preparation).   Free valet parking service is available. You will check in at ADMITTING. The support person will be asked to wait in the waiting room.  It is OK to have someone drop you off and come back when you are ready to be discharged.    Special note: Every effort is made to have your procedure done on time. Please understand that emergencies sometimes delay scheduled procedures.  2. Diet: Nothing to eat after midnight.   3. Hydration: You need to be well hydrated before your procedure. On January 27, you may drink approved liquids (see below) until 2 hours before the procedure, with 16 oz of water as your last intake.   List of approved liquids water, clear juice, clear tea, black coffee, fruit juices, non-citric and without pulp, carbonated beverages, Gatorade, Kool -Aid, plain Jello-O and plain ice popsicles.  4. Labs: You  will need to have blood drawn on Tuesday, January 20 at [facility] (1st Floor), [address], [place], [place] [address]. You do not need to be fasting.  5. Medication instructions in preparation for your procedure:   Contrast Allergy: No  Do not take Lasix  on the day of procedure. Last dose= Monday 11/13/24 Hold Jardiance  for 3 days before procedure. Last dose= Friday 11/10/24 Hold Ozempic  for 7 days before procedure. Last dose= Tuesday 11/07/24.   On the morning of your procedure, take your Aspirin  81 mg and any morning medicines NOT listed above.  You may use sips of water.  6. Plan to go home the same day, you will only stay overnight if medically necessary. 7. Bring a current list of your medications and current insurance cards. 8. You MUST have a responsible person to drive you home. 9. Someone MUST be with you the first 24 hours after you arrive home or your discharge will be delayed. 10. Please wear clothes that are easy to get on and off and wear slip-on shoes.  Thank you for allowing us  to care for you!   -- [facility]   Follow-Up: At [facility], you and your health needs are our priority.  As part of our continuing mission to provide you with exceptional heart care, our providers are all part of one team.  This team includes your primary Cardiologist (physician) and Advanced Practice Providers or APPs (  Physician Assistants and Nurse Practitioners) who all work together to provide you with the care you need, when you need it.  Your next appointment:    2-3 weeks after Heart Cath (heart cath= 11/14/24)  Provider:   Darryle ONEIDA Decent, MD    We recommend signing up for the patient portal called MyChart.  Sign up information is provided on this After Visit Summary.  MyChart is used to connect with patients for Virtual Visits (Telemedicine).  Patients are able to view lab/test results,  encounter notes, upcoming appointments, etc.  Non-urgent messages can be sent to your provider as well.   To learn more about what you can do with MyChart, go to [email or web address].

## 2024-11-07 NOTE — Telephone Encounter (Signed)
 Hi. Pt needs a 2-3wk fu per [place] after cath appt. Please advise. Pt preference: mid morning. Thank you.

## 2024-11-07 NOTE — Telephone Encounter (Signed)
 Spoke with pt regarding return office visit with Dr. Barbaraann post procedure. Pt scheduled. Pt verbalizes understanding.

## 2024-11-08 ENCOUNTER — Ambulatory Visit: Payer: Self-pay | Admitting: Cardiovascular Disease

## 2024-11-08 LAB — COMPREHENSIVE METABOLIC PANEL WITH GFR
ALT: 12 IU/L (ref 0–32)
AST: 12 IU/L (ref 0–40)
Albumin: 3.8 g/dL — AB (ref 3.9–4.9)
Alkaline Phosphatase: 69 IU/L (ref 41–116)
BUN/Creatinine Ratio: 18 (ref 9–23)
BUN: 25 mg/dL — AB (ref 6–24)
Bilirubin Total: 0.3 mg/dL (ref 0.0–1.2)
CO2: 18 mmol/L — AB (ref 20–29)
Calcium: 8.7 mg/dL (ref 8.7–10.2)
Chloride: 107 mmol/L — AB (ref 96–106)
Creatinine, Ser: 1.4 mg/dL — AB (ref 0.57–1.00)
Globulin, Total: 3 g/dL (ref 1.5–4.5)
Glucose: 124 mg/dL — AB (ref 70–99)
Potassium: 4.8 mmol/L (ref 3.5–5.2)
Sodium: 138 mmol/L (ref 134–144)
Total Protein: 6.8 g/dL (ref 6.0–8.5)
eGFR: 47 mL/min/1.73 — AB

## 2024-11-08 LAB — CBC
Hematocrit: 39.3 % (ref 34.0–46.6)
Hemoglobin: 13 g/dL (ref 11.1–15.9)
MCH: 32.6 pg (ref 26.6–33.0)
MCHC: 33.1 g/dL (ref 31.5–35.7)
MCV: 99 fL — ABNORMAL HIGH (ref 79–97)
Platelets: 179 x10E3/uL (ref 150–450)
RBC: 3.99 x10E6/uL (ref 3.77–5.28)
RDW: 13.2 % (ref 11.7–15.4)
WBC: 6 x10E3/uL (ref 3.4–10.8)

## 2024-11-08 LAB — LIPID PANEL
Cholesterol, Total: 152 mg/dL (ref 100–199)
HDL: 70 mg/dL
LDL CALC COMMENT:: 2.2 ratio (ref 0.0–4.4)
LDL Chol Calc (NIH): 66 mg/dL (ref 0–99)
Triglycerides: 88 mg/dL (ref 0–149)
VLDL Cholesterol Cal: 16 mg/dL (ref 5–40)

## 2024-11-08 LAB — TSH: TSH: 1.69 u[IU]/mL (ref 0.450–4.500)

## 2024-11-09 ENCOUNTER — Telehealth: Payer: Self-pay | Admitting: Cardiovascular Disease

## 2024-11-09 NOTE — Telephone Encounter (Signed)
 Patient requesting to reschedule heart cath with Dr. Elmira for a later date due to forecasted inclement weather. R/L Heart Cath rescheduled for 11/22/2024.  Updated instructions sent to patient via MyChart. Patient verbalized understanding and expressed appreciation for assistance.

## 2024-11-09 NOTE — Telephone Encounter (Signed)
 Pt would like a c/b regarding if she needs to reschedule Cath that is on 1/27 due to weather. Please advise

## 2024-11-10 ENCOUNTER — Ambulatory Visit: Payer: Self-pay | Admitting: Nurse Practitioner

## 2024-11-21 ENCOUNTER — Telehealth: Payer: Self-pay | Admitting: *Deleted

## 2024-11-22 ENCOUNTER — Other Ambulatory Visit: Payer: Self-pay

## 2024-11-22 ENCOUNTER — Encounter (HOSPITAL_COMMUNITY)
Admission: RE | Disposition: A | Discharge status: Home / Self Care | Payer: Self-pay | Source: Home / Self Care | Attending: Cardiology

## 2024-11-22 ENCOUNTER — Ambulatory Visit (HOSPITAL_COMMUNITY)
Admission: RE | Admit: 2024-11-22 | Discharge: 2024-11-22 | Disposition: A | Discharge status: Home / Self Care | Attending: Cardiology | Admitting: Cardiology

## 2024-11-22 DIAGNOSIS — I2510 Atherosclerotic heart disease of native coronary artery without angina pectoris: Secondary | ICD-10-CM | POA: Insufficient documentation

## 2024-11-22 DIAGNOSIS — G4733 Obstructive sleep apnea (adult) (pediatric): Secondary | ICD-10-CM | POA: Insufficient documentation

## 2024-11-22 DIAGNOSIS — I2722 Pulmonary hypertension due to left heart disease: Secondary | ICD-10-CM | POA: Insufficient documentation

## 2024-11-22 DIAGNOSIS — I2582 Chronic total occlusion of coronary artery: Secondary | ICD-10-CM | POA: Insufficient documentation

## 2024-11-22 DIAGNOSIS — E785 Hyperlipidemia, unspecified: Secondary | ICD-10-CM | POA: Insufficient documentation

## 2024-11-22 DIAGNOSIS — I130 Hypertensive heart and chronic kidney disease with heart failure and stage 1 through stage 4 chronic kidney disease, or unspecified chronic kidney disease: Secondary | ICD-10-CM | POA: Insufficient documentation

## 2024-11-22 DIAGNOSIS — E1122 Type 2 diabetes mellitus with diabetic chronic kidney disease: Secondary | ICD-10-CM | POA: Insufficient documentation

## 2024-11-22 DIAGNOSIS — I420 Dilated cardiomyopathy: Secondary | ICD-10-CM | POA: Insufficient documentation

## 2024-11-22 DIAGNOSIS — N1831 Chronic kidney disease, stage 3a: Secondary | ICD-10-CM | POA: Insufficient documentation

## 2024-11-22 DIAGNOSIS — E669 Obesity, unspecified: Secondary | ICD-10-CM | POA: Insufficient documentation

## 2024-11-22 DIAGNOSIS — Z21 Asymptomatic human immunodeficiency virus [HIV] infection status: Secondary | ICD-10-CM | POA: Insufficient documentation

## 2024-11-22 DIAGNOSIS — I5022 Chronic systolic (congestive) heart failure: Secondary | ICD-10-CM | POA: Insufficient documentation

## 2024-11-22 DIAGNOSIS — Z6841 Body Mass Index (BMI) 40.0 and over, adult: Secondary | ICD-10-CM | POA: Insufficient documentation

## 2024-11-22 LAB — POCT I-STAT EG7
Acid-base deficit: 15 mmol/L — ABNORMAL HIGH (ref 0.0–2.0)
Acid-base deficit: 6 mmol/L — ABNORMAL HIGH (ref 0.0–2.0)
Acid-base deficit: 7 mmol/L — ABNORMAL HIGH (ref 0.0–2.0)
Bicarbonate: 12.9 mmol/L — ABNORMAL LOW (ref 20.0–28.0)
Bicarbonate: 18.7 mmol/L — ABNORMAL LOW (ref 20.0–28.0)
Bicarbonate: 19.3 mmol/L — ABNORMAL LOW (ref 20.0–28.0)
Calcium, Ion: 1.04 mmol/L — ABNORMAL LOW (ref 1.15–1.40)
Calcium, Ion: 1.07 mmol/L — ABNORMAL LOW (ref 1.15–1.40)
Calcium, Ion: 1.22 mmol/L (ref 1.15–1.40)
HCT: 34 % — ABNORMAL LOW (ref 36.0–46.0)
HCT: 35 % — ABNORMAL LOW (ref 36.0–46.0)
HCT: 42 % (ref 36.0–46.0)
Hemoglobin: 11.6 g/dL — ABNORMAL LOW (ref 12.0–15.0)
Hemoglobin: 11.9 g/dL — ABNORMAL LOW (ref 12.0–15.0)
Hemoglobin: 14.3 g/dL (ref 12.0–15.0)
O2 Saturation: 55 %
O2 Saturation: 70 %
O2 Saturation: 71 %
Potassium: 3.5 mmol/L (ref 3.5–5.1)
Potassium: 4.1 mmol/L (ref 3.5–5.1)
Potassium: 4.4 mmol/L (ref 3.5–5.1)
Sodium: 107 mmol/L — CL (ref 135–145)
Sodium: 138 mmol/L (ref 135–145)
Sodium: 139 mmol/L (ref 135–145)
TCO2: 14 mmol/L — ABNORMAL LOW (ref 22–32)
TCO2: 20 mmol/L — ABNORMAL LOW (ref 22–32)
TCO2: 20 mmol/L — ABNORMAL LOW (ref 22–32)
pCO2, Ven: 35.9 mmHg — ABNORMAL LOW (ref 44–60)
pCO2, Ven: 35.9 mmHg — ABNORMAL LOW (ref 44–60)
pCO2, Ven: 37.2 mmHg — ABNORMAL LOW (ref 44–60)
pH, Ven: 7.165 — CL (ref 7.25–7.43)
pH, Ven: 7.323 (ref 7.25–7.43)
pH, Ven: 7.324 (ref 7.25–7.43)
pO2, Ven: 36 mmHg (ref 32–45)
pO2, Ven: 39 mmHg (ref 32–45)
pO2, Ven: 40 mmHg (ref 32–45)

## 2024-11-22 LAB — POCT I-STAT 7, (LYTES, BLD GAS, ICA,H+H)
Acid-base deficit: 6 mmol/L — ABNORMAL HIGH (ref 0.0–2.0)
Acid-base deficit: 9 mmol/L — ABNORMAL HIGH (ref 0.0–2.0)
Bicarbonate: 16.9 mmol/L — ABNORMAL LOW (ref 20.0–28.0)
Bicarbonate: 18.2 mmol/L — ABNORMAL LOW (ref 20.0–28.0)
Calcium, Ion: 1.19 mmol/L (ref 1.15–1.40)
Calcium, Ion: 1.2 mmol/L (ref 1.15–1.40)
HCT: 36 % (ref 36.0–46.0)
HCT: 36 % (ref 36.0–46.0)
Hemoglobin: 12.2 g/dL (ref 12.0–15.0)
Hemoglobin: 12.2 g/dL (ref 12.0–15.0)
O2 Saturation: 93 %
O2 Saturation: 97 %
Potassium: 4.3 mmol/L (ref 3.5–5.1)
Potassium: 4.6 mmol/L (ref 3.5–5.1)
Sodium: 132 mmol/L — ABNORMAL LOW (ref 135–145)
Sodium: 138 mmol/L (ref 135–145)
TCO2: 18 mmol/L — ABNORMAL LOW (ref 22–32)
TCO2: 19 mmol/L — ABNORMAL LOW (ref 22–32)
pCO2 arterial: 31.4 mmHg — ABNORMAL LOW (ref 32–48)
pCO2 arterial: 35.7 mmHg (ref 32–48)
pH, Arterial: 7.284 — ABNORMAL LOW (ref 7.35–7.45)
pH, Arterial: 7.371 (ref 7.35–7.45)
pO2, Arterial: 73 mmHg — ABNORMAL LOW (ref 83–108)
pO2, Arterial: 94 mmHg (ref 83–108)

## 2024-11-22 LAB — GLUCOSE, CAPILLARY
Glucose-Capillary: 148 mg/dL — ABNORMAL HIGH (ref 70–99)
Glucose-Capillary: 102 mg/dL — ABNORMAL HIGH (ref 70–99)

## 2024-11-22 MED ORDER — SODIUM CHLORIDE 0.9% FLUSH: 3.0000 mL | Freq: Two times a day (BID) | INTRAVENOUS | Status: DC

## 2024-11-22 MED ORDER — FENTANYL CITRATE (PF) 100 MCG/2ML IJ SOLN
INTRAMUSCULAR | Status: DC | PRN
  Administered 2024-11-22 (×2): 25 ug via INTRAVENOUS

## 2024-11-22 MED ORDER — LIDOCAINE HCL (PF) 1 % IJ SOLN
INTRAMUSCULAR | Status: AC
  Filled 2024-11-22: qty 30

## 2024-11-22 MED ORDER — VERAPAMIL HCL 2.5 MG/ML IV SOLN
INTRAVENOUS | Status: AC
  Filled 2024-11-22: qty 2

## 2024-11-22 MED ORDER — LIDOCAINE HCL (PF) 1 % IJ SOLN
INTRAMUSCULAR | Status: DC | PRN
  Administered 2024-11-22: 5 mL

## 2024-11-22 MED ORDER — HEPARIN SODIUM (PORCINE) 1000 UNIT/ML IJ SOLN
INTRAMUSCULAR | Status: AC
  Filled 2024-11-22: qty 10

## 2024-11-22 MED ORDER — ASPIRIN 81 MG PO CHEW: 81.0000 mg | CHEWABLE_TABLET | ORAL | Status: DC

## 2024-11-22 MED ORDER — SODIUM CHLORIDE 0.9% FLUSH: 3.0000 mL | INTRAVENOUS | Status: DC | PRN

## 2024-11-22 MED ORDER — MIDAZOLAM HCL (PF) 2 MG/2ML IJ SOLN
INTRAMUSCULAR | Status: DC | PRN
  Administered 2024-11-22 (×2): 1 mg via INTRAVENOUS

## 2024-11-22 MED ORDER — ACETAMINOPHEN 325 MG PO TABS: 650.0000 mg | ORAL_TABLET | ORAL | Status: DC | PRN

## 2024-11-22 MED ORDER — FREE WATER: 250.0000 mL | Freq: Once | Status: DC

## 2024-11-22 MED ORDER — HEPARIN (PORCINE) IN NACL 1000-0.9 UT/500ML-% IV SOLN
INTRAVENOUS | Status: DC | PRN
  Administered 2024-11-22: 1000 mL

## 2024-11-22 MED ORDER — VERAPAMIL HCL 2.5 MG/ML IV SOLN
INTRAVENOUS | Status: DC | PRN
  Administered 2024-11-22: 10 mL via INTRA_ARTERIAL

## 2024-11-22 MED ORDER — LABETALOL HCL 5 MG/ML IV SOLN: 10.0000 mg | INTRAVENOUS | Status: DC | PRN

## 2024-11-22 MED ORDER — ONDANSETRON HCL 4 MG/2ML IJ SOLN: 4.0000 mg | Freq: Four times a day (QID) | INTRAMUSCULAR | Status: DC | PRN

## 2024-11-22 MED ORDER — HYDRALAZINE HCL 20 MG/ML IJ SOLN: 10.0000 mg | INTRAMUSCULAR | Status: DC | PRN

## 2024-11-22 MED ORDER — SODIUM CHLORIDE 0.9 % IV SOLN: 250.0000 mL | INTRAVENOUS | Status: DC | PRN

## 2024-11-22 MED ORDER — LABETALOL HCL 5 MG/ML IV SOLN
INTRAVENOUS | Status: AC
  Filled 2024-11-22: qty 4

## 2024-11-22 MED ORDER — FENTANYL CITRATE (PF) 100 MCG/2ML IJ SOLN
INTRAMUSCULAR | Status: AC
  Filled 2024-11-22: qty 2

## 2024-11-22 MED ORDER — LABETALOL HCL 5 MG/ML IV SOLN
INTRAVENOUS | Status: DC | PRN
  Administered 2024-11-22: 10 mg via INTRAVENOUS

## 2024-11-22 MED ORDER — MIDAZOLAM HCL 2 MG/2ML IJ SOLN
INTRAMUSCULAR | Status: AC
  Filled 2024-11-22: qty 2

## 2024-11-22 MED ORDER — HEPARIN SODIUM (PORCINE) 1000 UNIT/ML IJ SOLN
INTRAMUSCULAR | Status: DC | PRN
  Administered 2024-11-22: 6000 [IU] via INTRAVENOUS

## 2024-11-22 NOTE — Discharge Instructions (Signed)

## 2024-11-22 NOTE — Interval H&P Note (Signed)
 History and Physical Interval Note:  11/22/2024 12:42 PM  Sherry Hall  has presented today for surgery, with the diagnosis of heart failure.  The various methods of treatment have been discussed with the patient and family. After consideration of risks, benefits and other options for treatment, the patient has consented to  Procedures: RIGHT/LEFT HEART CATH AND CORONARY ANGIOGRAPHY (N/A) as a surgical intervention.  The patient's history has been reviewed, patient examined, no change in status, stable for surgery.  I have reviewed the patient's chart and labs.  Questions were answered to the patient's satisfaction.     Cash Duce J Anyelin Mogle

## 2024-11-22 NOTE — H&P (Signed)
 OV 11/07/2024 copied for documentation   Cardiology Office Note:  .   Date:  11/22/2024  ID:  Sherry Hall, DOB 1980-11-22, MRN [id number] PCP: Paseda, Folashade R, FNP  [facility] Providers Cardiologist:  Darryle Sherry Decent, MD   History of Present Illness: .    No chief complaint on file.   Sherry Hall is a 44 y.o. female with below history who presents for follow-up.   History of Present Illness               Problem List 1. Systolic HF -35-40% 12/15/2020 -45-50% 05/06/2021 -45-50% 05/31/2024 -40% 10/26/2024 CMR -2/2 CAD based on CMR 2. Diabetes -A1c 6.2 3. Obesity -BMI 52 4. HLD -T chol 151, HDL 62, LDL 71, TG 85 5. HTN 6. OSA -mild 7. HIV 8. CAD -LCX infarction on CMR 9. CKD 3a    ROS: All other ROS reviewed and negative. Pertinent positives noted in the HPI.     Studies Reviewed: SABRA       CMR 10/26/2024 IMPRESSION: 1. Borderline dilated LV with thin and akinetic inferolateral wall, basal inferior wall, mid anterolateral wall. LV EF 40%.   2.  Normal RV size and systolic function, RV EF 55%.   3. Delayed enhancement imaging showed two different patterns that are difficult to reconcile. There was >50% wall thickness subendocardial LGE in the basal to mid inferolateral wall and the basal inferior wall. This is a coronary disease pattern suggesting prior myocardial infarction likely in the left circumflex territory. This territory is unlikely to be viable with attempted revascularization. In addition, there is mid-wall LGE in the basal septum. This is more prominent than the usual subtle mid-wall basal septal LGE seen nonspecifically in dilated cardiomyopathies. This pattern is concerning for cardiac sarcoidosis or prior myocarditis.   4. Elevated extracellular volume percentage at 36% suggestive of increased myocardial fibrotic content. This is not in a range suggestive of cardiac amyloidosis Physical Exam:   VS:  BP 131/68   Pulse  79   Temp 98 F (36.7 C) (Oral)   Resp 20   Ht 5' (1.524 m)   Wt 122.5 kg   SpO2 100%   BMI 52.73 kg/m    Wt Readings from Last 3 Encounters:  11/22/24 122.5 kg  11/07/24 124.8 kg  10/04/24 118.4 kg    GEN: Well nourished, well developed in no acute distress NECK: No JVD; No carotid bruits CARDIAC: RRR, no murmurs, rubs, gallops RESPIRATORY:  Clear to auscultation without rales, wheezing or rhonchi  ABDOMEN: Soft, non-tender, non-distended EXTREMITIES:  No edema; No deformity  ASSESSMENT AND PLAN: .   Assessment and Plan            Informed Consent   Shared Decision Making/Informed Consent The risks [stroke (1 in 1000), death (1 in 1000), kidney failure [usually temporary] (1 in 500), bleeding (1 in 200), allergic reaction [possibly serious] (1 in 200)], benefits (diagnostic support and management of coronary artery disease) and alternatives of a cardiac catheterization were discussed in detail with Sherry Hall and she is willing to proceed.             Follow-up: No follow-ups on file.  Signed, Darryle Sherry. Decent, MD, [facility]  [facility]  [address] [place], [place] [address] [phone]  12:29 PM

## 2024-11-23 ENCOUNTER — Encounter (HOSPITAL_COMMUNITY): Payer: Self-pay | Admitting: Cardiology

## 2024-12-04 ENCOUNTER — Ambulatory Visit: Admitting: Cardiovascular Disease

## 2024-12-05 ENCOUNTER — Ambulatory Visit: Admitting: Nurse Practitioner

## 2025-02-19 ENCOUNTER — Ambulatory Visit: Admitting: Infectious Diseases
